# Patient Record
Sex: Female | Born: 1955 | Race: White | Hispanic: No | Smoking: Current every day smoker
Health system: Southern US, Community
[De-identification: ages and names within clinical notes are randomized; demographics above are authoritative.]

## PROBLEM LIST (undated history)

## (undated) DIAGNOSIS — J189 Pneumonia, unspecified organism: Secondary | ICD-10-CM

## (undated) DIAGNOSIS — J45909 Unspecified asthma, uncomplicated: Secondary | ICD-10-CM

## (undated) DIAGNOSIS — C801 Malignant (primary) neoplasm, unspecified: Secondary | ICD-10-CM

## (undated) DIAGNOSIS — I10 Essential (primary) hypertension: Secondary | ICD-10-CM

## (undated) DIAGNOSIS — R059 Cough, unspecified: Secondary | ICD-10-CM

## (undated) HISTORY — PX: TONSILLECTOMY: SUR1361

## (undated) HISTORY — PX: ABDOMINAL HYSTERECTOMY: SHX81

## (undated) HISTORY — PX: JOINT REPLACEMENT: SHX530

## (undated) HISTORY — PX: BREAST SURGERY: SHX581

## (undated) HISTORY — DX: Cough, unspecified: R05.9

## (undated) HISTORY — PX: ELBOW SURGERY: SHX618

## (undated) NOTE — *Deleted (*Deleted)
[facility]  [address] [place], [place] [address] Phone: [phone]  Fax: [phone]   Clinic Day:  08/23/2020  Referring physician: Lamont Dowdy, MD  Chief Complaint: Tamara Hall is a 74 y.o. 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  HPI: The patient was last seen in the medical oncology clinic on 07/04/2020. At that time, she felt "fairly good." She denied any abdominal pain.  Exam revealed weakness in the LLQ of the abdominal wall. Calcium was 8.8. CEA was 40.9 on 06/13/2020.  Chest CT with contrast on 08/22/2020 revealed mild reduction in size of the left infrahilar peribronchovascular nodule (1.2 x 2.2 cm, previously 1.5 x 2.3 cm). There was patchy ground-glass opacity in the left upper lobe and lingula as well as the left lower lobe, favoring radiation pneumonitis given the patient's radiation therapy, atypical infection was a less likely differential diagnostic consideration. There was coronary, aortic arch, and branch vessel atherosclerotic vascular disease, old granulomatous disease, and ablation defect in the left hepatic lobe.   Labs on 08/07/2020 revealed a sodium of 133, chloride 96, BUN 7, calcium 8.4. Folate was 68.0. CEA was 7.7. She received a vitamin B12 injection.  During the interim, she noted significant shortness of breath.  Her inhaler was not helping.     Past Medical History:  Diagnosis Date  . Cancer (HCC)    colon, with mets to liver and left lung  . Hypertension   . Pneumonia    20+ years ago    Past Surgical History:  Procedure Laterality Date  . ABDOMINAL HYSTERECTOMY    . BREAST SURGERY    . COLON SURGERY  10/2017   [facility]  . COLONOSCOPY WITH PROPOFOL N/A 01/14/2017   Procedure: COLONOSCOPY WITH PROPOFOL;  Surgeon: Midge Minium, MD;  Location: [facility] ENDOSCOPY;  Service:  Endoscopy;  Laterality: N/A;  . ELBOW SURGERY Right    Has had 4 surgeries on right elbow.  Marland Kitchen HAND SURGERY Right 2008  . JOINT REPLACEMENT    . LIVER BIOPSY  10/2017  . lung wedge resection  10/2017  . PORTA CATH INSERTION N/A 01/29/2017   Procedure: Shelda Pal Cath Insertion;  Surgeon: Annice Needy, MD;  Location: [facility] INVASIVE CV LAB;  Service: Cardiovascular;  Laterality: N/A;  . TONSILLECTOMY      Family History  Problem Relation Age of Onset  . Lung cancer Mother 14  . Esophageal cancer Father 70  . Brain cancer Maternal Grandmother     Social History:  reports that she quit smoking about 2 years ago. Her smoking use included cigarettes. She has a 80.00 pack-year smoking history. She has never used smokeless tobacco. She reports that she does not drink alcohol and does not use drugs. She was smoking 1/2 packs/day.She lives in [place]. She has 3 children (ages 44, 66, and 59). She smoked 2 packs a day. She started smoking at age 54. She initially stopped smoking on 10/24/2017. She works in a plant as an Designer, television/film set for [employer]. 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 Fayrene Fearing is her significant other. The patient is alone*** today.    Allergies: No Known Allergies  Current Medications: Current Outpatient Medications  Medication Sig Dispense Refill  . albuterol (VENTOLIN HFA) 108 (90 Base) MCG/ACT inhaler Inhale into the lungs.    Marland Kitchen amLODipine (NORVASC) 5 MG tablet Take  5 mg by mouth daily.    . baclofen (LIORESAL) 10 MG tablet Take 10 mg by mouth 3 (three) times daily.    . folic acid (FOLVITE) 1 MG tablet TAKE 1 TABLET BY MOUTH EVERY DAY 90 tablet 1  . gabapentin (NEURONTIN) 300 MG capsule Take 1,200 mg by mouth 3 (three) times daily.     . hydrochlorothiazide (HYDRODIURIL) 25 MG tablet Take 25 mg by mouth daily.    Marland Kitchen HYDROcodone-acetaminophen (NORCO/VICODIN) 5-325 MG tablet Take 1 tablet by mouth every 6 (six) hours as needed for moderate pain. 15 tablet 0  . LORazepam (ATIVAN)  0.5 MG tablet Take 1 tablet (0.5 mg total) by mouth every 6 (six) hours as needed (NAUSEA). 30 tablet 0  . nortriptyline (PAMELOR) 10 MG capsule Take 10 mg by mouth at bedtime.    Marland Kitchen omeprazole (PRILOSEC) 20 MG capsule TAKE 1 CAPSULE BY MOUTH EVERY DAY 30 capsule 1  . Ostomy Supplies (PREMIER DRAINABLE POUCH ) Pouch MISC     . potassium chloride (KLOR-CON M10) 10 MEQ tablet TAKE 1 TABLET (10 MEQ TOTAL) BY MOUTH 5 (FIVE) TIMES DAILY. 120 tablet 0   No current facility-administered medications for this visit.    Review of Systems  Constitutional: Negative for chills, diaphoresis, fever, malaise/fatigue and weight loss (6 pounds).       Feels "fairly good."  HENT: Negative.  Negative for congestion, ear discharge, ear pain, hearing loss, nosebleeds, sinus pain, sore throat and tinnitus.   Eyes: Negative.  Negative for blurred vision and double vision.  Respiratory: Positive for cough, hemoptysis (mild) and wheezing. Negative for sputum production and shortness of breath.   Cardiovascular: Negative.  Negative for chest pain, palpitations and leg swelling.  Gastrointestinal: Negative for abdominal pain, blood in stool, constipation (takes milk of magnesia daily), diarrhea, heartburn (takes medication every morning), melena, nausea and vomiting.       S/p parastomal hernia repair on 03/22/2020.  Genitourinary: Negative.  Negative for dysuria, frequency and urgency.  Musculoskeletal: Positive for back pain. Negative for falls, joint pain, myalgias and neck pain.  Skin: Negative.  Negative for itching and rash.  Neurological: Negative.  Negative for dizziness, sensory change, speech change, weakness and headaches.       "Wobbly" balance.  Endo/Heme/Allergies: Negative.  Negative for environmental allergies. Does not bruise/bleed easily.  Psychiatric/Behavioral: Negative.  Negative for depression and memory loss. The patient is not nervous/anxious and does not have insomnia.   All other systems  reviewed and are negative.  Performance status (ECOG): 1***  Vitals There were no vitals taken for this visit.   Physical Exam Vitals and nursing note reviewed.  Constitutional:      General: She is not in acute distress.    Appearance: Normal appearance. She is well-developed.     Interventions: Face mask in place.     Comments:    HENT:     Head: Normocephalic and atraumatic.     Comments: Long hair.    Mouth/Throat:     Mouth: Mucous membranes are moist. No oral lesions.     Pharynx: Oropharynx is clear.  Eyes:     General: No scleral icterus.    Conjunctiva/sclera: Conjunctivae normal.     Pupils: Pupils are equal, round, and reactive to light.     Comments: Blue eyes.  Neck:     Vascular: No JVD.  Cardiovascular:     Rate and Rhythm: Normal rate and regular rhythm.     Heart sounds:  Normal heart sounds. No murmur heard.  No friction rub. No gallop.   Pulmonary:     Effort: Pulmonary effort is normal. No respiratory distress.     Breath sounds: Normal breath sounds. No wheezing, rhonchi or rales.  Chest:     Chest wall: No tenderness.  Abdominal:     General: Bowel sounds are normal. There is no distension.     Palpations: Abdomen is soft. There is no mass.     Tenderness: There is no abdominal tenderness. There is no guarding or rebound.     Comments: Weakness in abdominal wall, LLQ  Musculoskeletal:        General: No swelling or tenderness. Normal range of motion.     Cervical back: Normal range of motion and neck supple.  Lymphadenopathy:     Head:     Right side of head: No preauricular, posterior auricular or occipital adenopathy.     Left side of head: No preauricular, posterior auricular or occipital adenopathy.     Cervical: No cervical adenopathy.     Upper Body:     Right upper body: No supraclavicular or axillary adenopathy.     Left upper body: No supraclavicular or axillary adenopathy.     Lower Body: No right inguinal adenopathy. No left inguinal  adenopathy.  Skin:    General: Skin is warm.     Coloration: Skin is not pale.     Findings: No bruising, erythema, lesion or rash.  Neurological:     Mental Status: She is alert and oriented to person, place, and time.  Psychiatric:        Behavior: Behavior normal.        Thought Content: Thought content normal.        Judgment: Judgment normal.    Imaging studies: 01/13/2017:Abdomen and pelvic CTrevealed irregular thickening of the rectum suspicious for carcinoma. There was a 1.6 cm hypoattenuating lesion in the right hepatic lobe worrisome for metastatic disease. 01/15/2017:Chest CT revealed a 9 mm noncalcified left lower lobe pulmonary nodule (new). 01/16/2017:Pelvic MRIrevealed rectal adenocarcinoma T3N1. There was extension beyond the muscularis propria (17 mm). There were mesorectal nodes >= 5 mm. There was adenopathy on the left side of the mesorectum, including a 9 mm lesion. The distance from the tumor to the anal sphincter was 8 mm. 01/27/2017:PET scanrevealed intense metabolic activity associated rectal mass consists with primary rectal carcinoma. There was a hypermetabolic metastatic lesion to the central LEFT hepatic lobe. There was metabolic activity associated with small LEFT lower lobe nodule which was most consistent with pulmonary metastasis. 01/31/2017:Liver MRIrevealed a 2.6 x 2.7 x 2.1 cm lesion in segment VIII of the liver near the junction with segment IVa c/w a metastatic lesion. There was a 1.1 x 0.8 cm suspected metastatic lesion peripherally in segment VI of the liver.  05/16/2017:Abdomen and pelvic CTrevealed persistent but decreased asymmetric wall thickening in the rectum. The medial segment left liver lesion was smaller (2.7 x 2.6 cm to 1.9 x 1.6 cm). A tiny lesion seen in segment VI of the liver on previous MRI was not evident on today's CT scan. The left lower lobe pulmonary nodule was smaller (9 mm to 6 mm). There was no new or  progressive findings in the abdomen and pelvis. 08/19/2017:Chest CTwith contrast on 08/19/2017 revealed left lower lobe 6 mm solid pulmonary nodule, decreased since 01/15/2017 chest CT. Stable 7 mm right lower lobe pulmonary nodule. There was no new or progressive metastatic disease in the  chest.  09/03/2018: Pelvic MRI at [facility] demonstrated a partial response of the primary tumor and extramural disease. Post treatment category ymrT T3b. There was an overall decrease in size in the previously seen perirectal node. Hypointense changes at the site of prior tumor focally contacedt buts does not definitely invade the LEFT levator ani. Tumor contacted, but does not definitely invade the internal anal sphincter. No evidence of residual or recurrent hepatic disease following microwave ablation. 02/17/2018:Abdomen and pelvic CTrevealed evolution of the ablation zone in segment 4A, now measuring 3.7 x 3.1 cm, decreased.There was no findings to suggest viable tumor.She is s/pabdominoperineal resection with left lower quadrant colostomy. There was no evidence of new/progressive metastatic disease.She is s/pleft lower lobe pulmonary wedge resection, incompletely visualized. 03/04/2018:Chest CTrevealed a stable chest CT (compared to11/13/2018). The index right lower lobe lung nodule measured7 mm (unchanged). There was no new or progressive nodularity identified. There was continued decrease in size of ablation defect within segment 4a of the liver. 08/13/2019:[facility] parastomal hernia with small bowel as well as multiple small bowel loops whichwere dilated.There was hepatic steatosis and post-treatment changes in the pelvis with persistent presacral soft tissue. There was a 3.5 cm infrarenal abdominal aortic aneurysm. 09/09/2019:C[facility] no intrathoracic metastasis and no new or enlarging pulmonary nodules. 09/09/2019:Abdomen and  pelvis CT at [facility] multiple subcentimeter hypodensities in the dome of the liver, which appeared new/more conspicuous from prior CT. There were not seen on the prior MRI. Further evaluation with MRI was recommended. CT evaluationwas limited due to background fatty liver and the heterogeneity/these lesions could have been secondary to fatty liver. There was unchanged soft tissue stranding and nodularity in the pelvis, with a few prominent more nodular areas. There were a few more prominent indeterminateareasand short-term follow up was recommended to ensure stability and exclude peritoneal disease. Further evaluation with MRI could have been helpful. 11/12/2019: Liver and pelvis MRIrevealedno findings to explain the patient's history of rising CEA. There was irregular presacral soft tissue with apparent scarring noted in the left paracentral pelviswas similar to prior studies and presumably treatment related. PET-CT could be used, as clinically warranted to exclude hypermetabolic disease within thatregion. There was an ablation defect identified in segment IV with minimal irregular enhancement along the anterosuperior margin, indeterminate. Attention on follow-up recommended.There was a tiny cyst identified in the dome of the right liver without other findings to suggest metastatic disease in the hepatic parenchyma. 11/29/2019:PET scanrevealed 1.6 x 1.5 cmnodularthickeningat the resection margin (SUV 11.3)in the LEFT lower lobe. There was no evidence of hepatic metastasis. There was no evidence of local recurrence in pelvis. There was insufficiency fracture in the LEFT sacral ala presumably related to radiation treatment. 02/23/2020:  Chest, abdomen and pelvis CT revealed a stable 2.6 x 1.7 cm partially calcified left infrahilar pulmonary nodule, hypermetabolic on PET scan and likely metastasis.  There were new no new or enlarging pulmonary nodules.  There were stable treated hepatic  metastatic disease with no evidence of progression or local recurrence.  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  There is no evidence of incarceration or obstruction.  There was mild wall thickening of multiple loops of small bowel in the mid abdomen, possibly related to prior radiation therapy.  There was stable chronic sacral insufficiency fractures bilaterally.  There was a stable 3.7 cm fusiform infrarenal abdominal aortic aneurysm.  03/22/2020:  Abdomen and pelvis CT at [facility] revealed a high-grade small  bowel obstruction. There were 2 separate loops of small bowel contained within the parastomal hernia. The first was more proximal small bowel which appeared dilated but nonobstructed. The second segment of included small bowel was more distal small bowel which appeared dilated entering into the herniaand collapsed exiting the hernia, consistent with point of obstruction.  There was a 3.7 cm infrarenal abdominal aortic aneurysm with extensive mural thrombus, similar in size compared with previous examination, although degree of mural thrombus had increased. Additional calcified atherosclerotic plaque was noted throughout the abdominal aorta and its branches.  08/22/2020:  Chest CT with contrast revealed mild reduction in size of the left infrahilar peribronchovascular nodule (1.2 x 2.2 cm, previously 1.5 x 2.3 cm. There was patchy ground-glass opacity in the left upper lobe and lingula as well as the left lower lobe, favoring radiation pneumonitis given the patient's radiation therapy, atypical infection was a less likely differential diagnostic consideration. There was coronary, aortic arch, and branch vessel atherosclerotic vascular disease, old granulomatous disease, and ablation defect in the left hepatic lobe.   No visits with results within 3 Day(s) from this visit.  Latest known visit with results is:  Infusion on 08/07/2020  Component Date Value  Ref Range Status  . CEA 08/07/2020 7.7* 0.0 - 4.7 ng/mL Final   Comment: (NOTE)                             Nonsmokers          <3.9                             Smokers             <5.6 [employer] Electrochemiluminescence Immunoassay (ECLIA) Values obtained with different assay methods or kits cannot be used interchangeably.  Results cannot be interpreted as absolute evidence of the presence or absence of malignant disease. Performed At: [facility] [address] [place], [place] [id number] Jolene Schimke MD YQ:[id number]   . Sodium 08/07/2020 133* 135 - 145 mmol/L Final  . Potassium 08/07/2020 3.7  3.5 - 5.1 mmol/L Final  . Chloride 08/07/2020 96* 98 - 111 mmol/L Final  . CO2 08/07/2020 28  22 - 32 mmol/L Final  . Glucose, Bld 08/07/2020 108* 70 - 99 mg/dL Final   Glucose reference range applies only to samples taken after fasting for at least 8 hours.  . BUN 08/07/2020 7* 8 - 23 mg/dL Final  . Creatinine, Ser 08/07/2020 0.73  0.44 - 1.00 mg/dL Final  . Calcium 52/84/1324 8.4* 8.9 - 10.3 mg/dL Final  . GFR, Estimated 08/07/2020 >60  >60 mL/min Final   Comment: (NOTE) Calculated using the CKD-EPI Creatinine Equation (2021)   . Anion gap 08/07/2020 9  5 - 15 Final   Performed at [facility], [address], [place], [place] [address]  . Folate 08/07/2020 68.0  >5.9 ng/mL Final   Performed at [facility], [address] [place] [place], [place] [address]    Assessment:  JARELLY RINCK is a 54 y.o. female with clinical stage T3N1M1rectal cancer. She presented with a 9 month history of rectal bleeding. Colonoscopyon 01/14/2017 revealed afrond-like/villous non-obstructing large mass in the rectum. The mass was non-circumferential. Biopsies revealed high grade dysplasia within an adenoma (? sampling error). CEAwas 31.6 on 01/15/2017.  PET scanon 01/27/2017 revealed intense metabolic activity associated rectal mass consists with primary  rectal  carcinoma. There was a hypermetabolic metastatic lesion to the central LEFT hepatic lobe. There was metabolic activity associated with small LEFT lower lobe nodule which was most consistent with pulmonary metastasis.  CT guided liver biopsyat [facility] on 02/07/2017 revealed malignant cells c/w metastatic colorectal adenocarcinoma. [facility] on 12/29/2019 [id number]) revealed MS-stable, 1 Muts/Mb tumor mutational burden; KRAS G12C, PTEN rearrangement intron 8, APC G1106*, T1458fs*18, and NRAS wildtype.  There were no reportable mutations in BRAF and NRAS.     She received 2 cycles of FOLFOX (02/18/2017 - 03/04/2017). She developed transient cold induced neuropathy. She developed wheezing and hypoxia with cycle #2 felt secondary to oxaliplatin. She received 4 cycles ofFOLFIRI(03/24/2017 - 04/22/2017) with Neulasta support. Diarrhea was controlled with Imodium.  She underwent CT guided microwave ablation of the liver lesionon 06/06/2017.   She began radiationon 06/18/2017. She receiveddaily Meliton Rattan radiation. Radiation and Xeloda completed on 08/15/2017.  She underwentAPR with descending colostomy and VRAM flapon 10/24/2017. She underwent a VATS wedge resectionto her LEFT lung. Pathology from the lung demonstrateda 1 cmmetastatic intestinal type adenocarcinoma c/w metastasis from patient's known colorectal carcinoma.Pathology from the rectal resection revealed a 2.1 cm invasive moderately differentiated adenocarcinoma arising in a tubular adenoma with transmural invasion focally into the perirectal soft tissue. There was no lymphovascular or perineural invasion. Margins were negative. Zero of 20 lymph nodes were positive. Final diagnosiswas [id number] rectal adenocarcinoma with metastasis to left lower lobe of the lung.  She has had post-operative pain and ostomy ischemia. She underwent surgical ostomy revisionon 11/28/2017. Pathologyshowed focal mucosal  erosion and fat necrosis c/w ischemia. There was extensive underlying fat necrosis. No dysplasia or carcinoma was identified. She was treated with oral Augmentin and wound packing. She has been treated with oxycodone 5 mg po q 4 hrs prn, gabapentin 900 mg TID. She was treated with Bactrim on 01/18/2018. She was referred to local pain management clinic on 01/27/2018.  PET scanon 11/29/2019 showed local recurrenceof hypermetabolic nodular lung malignancy at the resection margin(1.6 x 1.5 cm; SUV 11.3)in the LEFT lower lobe. There was no evidence of hepatic metastasis. There was no evidence of local recurrence in pelvis. There was insufficiency fracture in the LEFT sacral ala presumably related to radiation treatment.  Chest CTon 12/13/2019 revealed anenlarging partially calcified2.3 x 1.2 cmlesion in the medial aspect of the left lower lobe most compatible with a pulmonary metastasis given the hypermetabolism on recent PET-CT.There were several other tiny pulmonary nodules measuring 6 mm or less in size, some of whichwere new or increased in size compared to prior examinations.  She declined surgical resection.  Chest, abdomen and pelvis CT on 02/23/2020 revealed a stable 2.6 x 1.7 cm partially calcified left infrahilar pulmonary nodule, hypermetabolic on PET scan and likely metastasis.  There were new no new or enlarging pulmonary nodules.  There were stable treated hepatic metastatic disease with no evidence of progression or local recurrence.  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  There is no evidence of incarceration or obstruction.  There was mild wall thickening of multiple loops of small bowel in the mid abdomen, possibly related to prior radiation therapy.  There was stable chronic sacral insufficiency fractures bilaterally.  There was a stable 3.7 cm fusiform infrarenal abdominal aortic aneurysm.   She is s/p 1 cycle of  FOLFIRI (03/08/2020) with Fulphila support.  Cycle #1 was notable for nausea, vomiting, and diarrhea.  She declined further chemotherapy.  She received IMRT (6000  cGy) to the left lung lesion from 05/10/2020 - 05/22/2020.   Chest CT with contrast on 08/22/2020 revealed mild reduction in size of the left infrahilar peribronchovascular nodule (1.2 x 2.2 cm, previously 1.5 x 2.3 cm). There was patchy ground-glass opacity in the left upper lobe and lingula as well as the left lower lobe, favoring radiation pneumonitis.  CEAhas been followed: 31.6 on 01/15/2017, 38.1 on 02/18/2017, 25.1 on 03/18/2017, 11.8 on 04/08/2017, 9.2 on 04/22/2017, 9.5 on 05/20/2017, 9.6 on 06/19/2017, 3.7 on 02/18/2018,5.8 on 06/26/2018, 10.5 on 11/20/2018, 19.5 on 03/26/2019, 34.0 on 07/21/2019, 46.4 on 09/09/2019, 54.4 on 10/28/2019, 108.0 on 02/10/2020, 121.0 on 03/08/2020, 49.8 on 04/13/2020, 76.7 on 05/29/2020, 40.9 on 06/13/2020, and 7.7 on 08/07/2020.  Colonoscopy at [facility] 09/13/2019 did not appear malignant with no adenomatous (granulation tissue). There was diverticulosis in the descending colon, normal mucosa in the entire examined colon and the examined portion of the ileum. There was a 9 mmlipoma in the transverse colon anda4 mm polyp at the surgical stoma.   She has a B12 deficiency.[id number] (low)on 12/03/2019. She restarted B12 injections on 12/13/2019 (last 08/07/2020).  She has folate deficiency.  Folate was 5.8 (low) on 12/03/2019 and 68.0 on 08/07/2020.  She is on folic acid.    She has chronic hypokalemia.  She is on potassium 20 meq po q day.  [facility] for a smallbowel obstruction. She had severe abdominal pain with associated nausea and vomiting. Abdominal and pelvicCT revealedmultiple small bowel loops whichwere dilated.Obstruction resolved spontaneously.  She was admitted to [facility] from 03/22/2020 - 03/28/2020. She presented with an  incarcerated hernia and underwent parastomal hernia repair with no bowel resection on 03/22/2020.  There was a 3.7 cm infrarenal abdominal aortic aneurysm with extensive mural thrombus, similar in size compared with previous examination, although degree of mural thrombus had increased.  She does not plan to have the COVID-19 vaccine.   Symptomatically, ***  Plan: 1.   Labs today: CBC with diff, CMP.   2.Stage IV rectal carcinoma Shereceived6cycles of FOLFOX(02/18/2017 -04/22/2017).  She underwent CT guided microwave ablation of the liver lesionon 06/06/2017.  She receivedradiationand daily Xeloda (06/18/2017-08/15/2017). She underwentAPR with descending colostomy and VRAM flapon 10/24/2017.  She underwent a VATS wedge resection of her left lung. 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  She declined left lower lobe lobectomy.  She received 1 cycle of FOLFIRI (03/08/2020).                She declined further chemotherapy or surgery.  She completed SBRT to the left infrahilar nodule on 05/22/2020.  CEA was 40.9 on 06/13/2020 and 7.7 on 08/07/2020.   Chest CT on 08/22/2020 was personally reviewed.  Agree with radiology interpretation.   Left infrahilar nodule is slightly smaller.  Symptomatically, she is doing well.   Discuss plan for follow-up chest CT on 08/22/2020. 3. B12 and folate deficiency [id number] (low)on 12/03/2019 and folate 5.8 (low) on 12/03/2019. She receives B12 monthly (last 06/23/2020).             Continue B12 monthly.            4.Hypokalemia Potassium3.5 today. She is on potassium 60 mEq a day.  Patient to follow-up with Dr. Delane Ginger regarding likely HCTZ associated hypokalemia 5.   RTC in 1 month for labs (BMP, folate, CEA). 6.   RTC on 11/18 for MD  assessment, labs (CBC with diff, CMP), and review of chest CT.  I discussed the assessment and treatment plan with the patient.  The patient was provided an opportunity to ask questions and all were answered.  The patient agreed with the plan and demonstrated an understanding of the instructions.  The patient was advised to call back if the symptoms worsen or if the condition fails to improve as anticipated.   I provided *** minutes of face-to-face time during this this encounter and > 50% was spent counseling as documented under my assessment and plan.  Rosey Bath, MD, PhD    08/23/2020, 2:20 PM  I, Danella Penton Tufford, am acting as a Neurosurgeon for [employer]. Merlene Pulling, MD.   I, Jeremi Losito C. Merlene Pulling, MD, have reviewed the above documentation for accuracy and completeness, and I agree with the above.

---

## 2005-10-11 ENCOUNTER — Ambulatory Visit: Payer: Self-pay

## 2006-10-07 HISTORY — PX: HAND SURGERY: SHX662

## 2008-09-27 ENCOUNTER — Ambulatory Visit: Payer: Self-pay

## 2009-06-26 ENCOUNTER — Emergency Department: Payer: Self-pay | Admitting: Emergency Medicine

## 2010-05-22 ENCOUNTER — Ambulatory Visit: Payer: Self-pay | Admitting: Unknown Physician Specialty

## 2010-11-29 ENCOUNTER — Ambulatory Visit: Payer: Self-pay | Admitting: Unknown Physician Specialty

## 2011-06-27 ENCOUNTER — Ambulatory Visit: Payer: Self-pay | Admitting: Unknown Physician Specialty

## 2012-07-07 ENCOUNTER — Ambulatory Visit: Payer: Self-pay | Admitting: Pain Medicine

## 2012-07-20 ENCOUNTER — Ambulatory Visit: Payer: Self-pay | Admitting: Pain Medicine

## 2012-08-26 ENCOUNTER — Ambulatory Visit: Payer: Self-pay | Admitting: Pain Medicine

## 2014-05-24 ENCOUNTER — Ambulatory Visit: Payer: Self-pay | Admitting: Podiatry

## 2017-01-13 ENCOUNTER — Emergency Department: Payer: Managed Care, Other (non HMO)

## 2017-01-13 ENCOUNTER — Inpatient Hospital Stay
Admission: EM | Admit: 2017-01-13 | Discharge: 2017-01-16 | DRG: 375 | Disposition: A | Discharge status: Home / Self Care | Payer: Managed Care, Other (non HMO) | Attending: Internal Medicine | Admitting: Internal Medicine

## 2017-01-13 DIAGNOSIS — Z808 Family history of malignant neoplasm of other organs or systems: Secondary | ICD-10-CM | POA: Diagnosis not present

## 2017-01-13 DIAGNOSIS — I10 Essential (primary) hypertension: Secondary | ICD-10-CM | POA: Diagnosis not present

## 2017-01-13 DIAGNOSIS — Z90710 Acquired absence of both cervix and uterus: Secondary | ICD-10-CM

## 2017-01-13 DIAGNOSIS — R5383 Other fatigue: Secondary | ICD-10-CM | POA: Diagnosis not present

## 2017-01-13 DIAGNOSIS — Z801 Family history of malignant neoplasm of trachea, bronchus and lung: Secondary | ICD-10-CM

## 2017-01-13 DIAGNOSIS — C20 Malignant neoplasm of rectum: Secondary | ICD-10-CM

## 2017-01-13 DIAGNOSIS — R198 Other specified symptoms and signs involving the digestive system and abdomen: Secondary | ICD-10-CM

## 2017-01-13 DIAGNOSIS — K5900 Constipation, unspecified: Secondary | ICD-10-CM | POA: Diagnosis present

## 2017-01-13 DIAGNOSIS — I714 Abdominal aortic aneurysm, without rupture: Secondary | ICD-10-CM | POA: Diagnosis not present

## 2017-01-13 DIAGNOSIS — R109 Unspecified abdominal pain: Secondary | ICD-10-CM | POA: Diagnosis not present

## 2017-01-13 DIAGNOSIS — K769 Liver disease, unspecified: Secondary | ICD-10-CM | POA: Diagnosis present

## 2017-01-13 DIAGNOSIS — K5730 Diverticulosis of large intestine without perforation or abscess without bleeding: Secondary | ICD-10-CM | POA: Diagnosis present

## 2017-01-13 DIAGNOSIS — K625 Hemorrhage of anus and rectum: Secondary | ICD-10-CM | POA: Diagnosis present

## 2017-01-13 DIAGNOSIS — F17210 Nicotine dependence, cigarettes, uncomplicated: Secondary | ICD-10-CM | POA: Diagnosis present

## 2017-01-13 DIAGNOSIS — R1900 Intra-abdominal and pelvic swelling, mass and lump, unspecified site: Secondary | ICD-10-CM | POA: Diagnosis not present

## 2017-01-13 DIAGNOSIS — Z9889 Other specified postprocedural states: Secondary | ICD-10-CM | POA: Diagnosis not present

## 2017-01-13 DIAGNOSIS — Z9660 Presence of unspecified orthopedic joint implant: Secondary | ICD-10-CM | POA: Diagnosis present

## 2017-01-13 DIAGNOSIS — R933 Abnormal findings on diagnostic imaging of other parts of digestive tract: Secondary | ICD-10-CM | POA: Diagnosis not present

## 2017-01-13 DIAGNOSIS — D490 Neoplasm of unspecified behavior of digestive system: Secondary | ICD-10-CM | POA: Diagnosis not present

## 2017-01-13 DIAGNOSIS — R140 Abdominal distension (gaseous): Secondary | ICD-10-CM | POA: Diagnosis not present

## 2017-01-13 DIAGNOSIS — D375 Neoplasm of uncertain behavior of rectum: Secondary | ICD-10-CM

## 2017-01-13 DIAGNOSIS — K921 Melena: Secondary | ICD-10-CM | POA: Diagnosis not present

## 2017-01-13 HISTORY — DX: Essential (primary) hypertension: I10

## 2017-01-13 LAB — URINALYSIS, COMPLETE (UACMP) WITH MICROSCOPIC
BACTERIA UA: NONE SEEN
Bilirubin Urine: NEGATIVE
Glucose, UA: NEGATIVE mg/dL
Hgb urine dipstick: NEGATIVE
Ketones, ur: NEGATIVE mg/dL
Nitrite: NEGATIVE
PH: 6 (ref 5.0–8.0)
Protein, ur: NEGATIVE mg/dL
Specific Gravity, Urine: 1.009 (ref 1.005–1.030)

## 2017-01-13 LAB — CBC
HEMATOCRIT: 38.5 % (ref 35.0–47.0)
Hemoglobin: 12.9 g/dL (ref 12.0–16.0)
MCH: 30.6 pg (ref 26.0–34.0)
MCHC: 33.5 g/dL (ref 32.0–36.0)
MCV: 91.4 fL (ref 80.0–100.0)
PLATELETS: 326 10*3/uL (ref 150–440)
RBC: 4.21 MIL/uL (ref 3.80–5.20)
RDW: 13.4 % (ref 11.5–14.5)
WBC: 11.1 10*3/uL — ABNORMAL HIGH (ref 3.6–11.0)

## 2017-01-13 LAB — HEMOGLOBIN: HEMOGLOBIN: 13.2 g/dL (ref 12.0–16.0)

## 2017-01-13 LAB — COMPREHENSIVE METABOLIC PANEL
ALBUMIN: 4.2 g/dL (ref 3.5–5.0)
ALT: 13 U/L — AB (ref 14–54)
AST: 18 U/L (ref 15–41)
Alkaline Phosphatase: 73 U/L (ref 38–126)
Anion gap: 4 — ABNORMAL LOW (ref 5–15)
BUN: 12 mg/dL (ref 6–20)
CHLORIDE: 104 mmol/L (ref 101–111)
CO2: 30 mmol/L (ref 22–32)
CREATININE: 0.72 mg/dL (ref 0.44–1.00)
Calcium: 9.1 mg/dL (ref 8.9–10.3)
GFR calc Af Amer: >60 mL/min (ref >60)
GLUCOSE: 96 mg/dL (ref 65–99)
POTASSIUM: 3.9 mmol/L (ref 3.5–5.1)
Sodium: 138 mmol/L (ref 135–145)
Total Bilirubin: 0.2 mg/dL — ABNORMAL LOW (ref 0.3–1.2)
Total Protein: 7.4 g/dL (ref 6.5–8.1)

## 2017-01-13 MED ORDER — IOPAMIDOL (ISOVUE-300) INJECTION 61%
100.0000 mL | Freq: Once | INTRAVENOUS | Status: AC | PRN
  Administered 2017-01-13: 100 mL via INTRAVENOUS
  Filled 2017-01-13: qty 100

## 2017-01-13 MED ORDER — IOPAMIDOL (ISOVUE-300) INJECTION 61%
30.0000 mL | Freq: Once | INTRAVENOUS | Status: AC | PRN
  Administered 2017-01-13: 30 mL via ORAL
  Filled 2017-01-13: qty 30

## 2017-01-13 MED ORDER — ACETAMINOPHEN 650 MG RE SUPP: 650.0000 mg | Freq: Four times a day (QID) | RECTAL | Status: DC | PRN

## 2017-01-13 MED ORDER — PEG 3350-KCL-NA BICARB-NACL 420 G PO SOLR
4000.0000 mL | Freq: Once | ORAL | Status: AC
  Administered 2017-01-13: 21:00:00 4000 mL via ORAL
  Filled 2017-01-13: qty 4000

## 2017-01-13 MED ORDER — HYDRALAZINE HCL 20 MG/ML IJ SOLN: 10.0000 mg | Freq: Four times a day (QID) | INTRAMUSCULAR | Status: DC | PRN

## 2017-01-13 MED ORDER — ACETAMINOPHEN 325 MG PO TABS
650.0000 mg | ORAL_TABLET | Freq: Four times a day (QID) | ORAL | Status: DC | PRN
  Administered 2017-01-13: 21:00:00 650 mg via ORAL
  Filled 2017-01-13: qty 2

## 2017-01-13 MED ORDER — ONDANSETRON HCL 4 MG/2ML IJ SOLN
4.0000 mg | Freq: Four times a day (QID) | INTRAMUSCULAR | Status: DC | PRN
  Administered 2017-01-15 – 2017-01-16 (×2): 4 mg via INTRAVENOUS
  Filled 2017-01-13 (×2): qty 2

## 2017-01-13 MED ORDER — ONDANSETRON HCL 4 MG PO TABS: 4.0000 mg | ORAL_TABLET | Freq: Four times a day (QID) | ORAL | Status: DC | PRN

## 2017-01-13 MED ORDER — PNEUMOCOCCAL VAC POLYVALENT 25 MCG/0.5ML IJ INJ: 0.5000 mL | INJECTION | INTRAMUSCULAR | Status: DC

## 2017-01-13 NOTE — ED Triage Notes (Signed)
Pt reports that she has rectal bleeding with abd pain for the past 9 months but over the last 3 days she has been having rectal bleeding to the point that it is running down her leg and she passed a huge blood clot from her rectum 2 times yesterday and today

## 2017-01-13 NOTE — Consult Note (Signed)
Patient ID: Tamara Hall, female   DOB: 26-Sep-1956, 61 y.o.   MRN: [id number]  CC: ABDOMINAL PAIN  HPI Tamara Hall is a 61 y.o. female who is currently admitted to the medicine service with abdominal pain and blood per rectum. Surgical consultation requested by Dr. Verdell Carmine. Patient reports that she's been having intermittent rectal bleeding, rectal pressure, abdominal pain and small stools over the last 8-9 months. She reports a significant increase in amount of blood per rectum over the last 3 days. She described significant bloody output. Patient also reports that she cannot sit directly on a chair due to the sensation of pressure. She has the constant feeling of needing to go to the bathroom but usually nothing comes out. Patient reports that prior to the weekend she thought it was a problem that would go away on its own. She reports that she's had a feeling of weakness, dizziness as well as a relative anorexia over the last many months. She has been scared to eat due to abdominal distention and bloating caused by eating. Secondary to this she has had significant weight loss. She denies any fevers, chills, chest pain, shortness breath, nausea, vomiting. She does not take any chronic medications and denies any other recent changes in her health.  HPI  Past Medical History:  Diagnosis Date  . Hypertension     Past Surgical History:  Procedure Laterality Date  . ABDOMINAL HYSTERECTOMY    . BREAST SURGERY    . JOINT REPLACEMENT    . TONSILLECTOMY      Family History  Problem Relation Age of Onset  . Lung cancer Mother   . Lung cancer Father   . Brain cancer Maternal Grandmother     Social History Social History  Substance Use Topics  . Smoking status: Current Every Day Smoker    Packs/day: 2.00    Years: 40.00    Types: Cigarettes  . Smokeless tobacco: Never Used  . Alcohol use No    No Known Allergies  Current Facility-Administered Medications  Medication Dose Route  Frequency Provider Last Rate Last Dose  . acetaminophen (TYLENOL) tablet 650 mg  650 mg Oral Q6H PRN Henreitta Leber, MD       Or  . acetaminophen (TYLENOL) suppository 650 mg  650 mg Rectal Q6H PRN Henreitta Leber, MD      . hydrALAZINE (APRESOLINE) injection 10 mg  10 mg Intravenous Q6H PRN Henreitta Leber, MD      . ondansetron (ZOFRAN) tablet 4 mg  4 mg Oral Q6H PRN Henreitta Leber, MD       Or  . ondansetron (ZOFRAN) injection 4 mg  4 mg Intravenous Q6H PRN Henreitta Leber, MD      . Derrill Memo ON 01/14/2017] pneumococcal 23 valent vaccine (PNU-IMMUNE) injection 0.5 mL  0.5 mL Intramuscular Tomorrow-1000 Henreitta Leber, MD      . polyethylene glycol-electrolytes (NuLYTELY/GoLYTELY) solution 4,000 mL  4,000 mL Oral Once Henreitta Leber, MD         Review of Systems A Multi-point review of systems was asked and was negative except for the findings documented in the history of present illness  Physical Exam Blood pressure 139/62, pulse 76, temperature 97.7 F (36.5 C), temperature source Oral, resp. rate 20, height '5\' 7"'$  (1.702 m), weight 72.6 kg (160 lb), SpO2 100 %. CONSTITUTIONAL: No acute distress. EYES: Pupils are equal, round, and reactive to light, Sclera are non-icteric. EARS, NOSE, MOUTH AND THROAT:  The oropharynx is clear. The oral mucosa is pink and moist. Hearing is intact to voice. LYMPH NODES:  Lymph nodes in the neck are normal. RESPIRATORY:  Lungs are clear. There is normal respiratory effort, with equal breath sounds bilaterally, and without pathologic use of accessory muscles. CARDIOVASCULAR: Heart is regular without murmurs, gallops, or rubs. GI: The abdomen is soft, nontender, and nondistended. There are no palpable masses. There is no hepatosplenomegaly. There are normal bowel sounds in all quadrants. GU: Rectal deferred.   MUSCULOSKELETAL: Normal muscle strength and tone. No cyanosis or edema.   SKIN: Turgor is good and there are no pathologic skin lesions or  ulcers. NEUROLOGIC: Motor and sensation is grossly normal. Cranial nerves are grossly intact. PSYCH:  Oriented to person, place and time. Affect is normal.  Data Reviewed Images and labs reviewed. Labs show a mild leukocytosis of 11.1, normal H&H of 12.9 and 38.5, normal platelets of 326. Electrolytes are all within normal limits. CT scan of the abdomen does appear to show thickening of the rectal mucosa concerning for rectal versus colon cancer. Also appears to show an isolated liver metastases to the right lobe of the liver. I have personally reviewed the patient's imaging, laboratory findings and medical records.    Assessment    Rectal mass    Plan    61 year old female that his medicine service for abdominal pain and rectal bleeding. Images concerning for rectal mass concerning for cancer. Patient voiced understanding of the dire nature of her diagnosis. She also voiced understanding that the next step in diagnosing this would be the endoscopy is currently planned for tomorrow. Discussed that any operative planning prior to that would be difficult. She understands that she'll be meeting and oncologist once a tumor diagnosis is made. Discussed the possibility of needing an ostomy for diversion should she develop obstruction of her bowel from the tumor. Patient voiced understanding. The Gen. surgery services will continue to follow and await results of endoscopy.      Time spent with the patient was 80 minutes, with more than 50% of the time spent in face-to-face education, counseling and care coordination.     Clayburn Pert, MD FACS General Surgeon 01/13/2017, 8:47 PM

## 2017-01-13 NOTE — ED Provider Notes (Signed)
[facility] Emergency Department Provider Note  ____________________________________________  Time seen: Approximately 3:10 PM  I have reviewed the triage vital signs and the nursing notes.   HISTORY  Chief Complaint Abdominal Pain and Rectal Bleeding   HPI Tamara Hall is a 61 y.o. female with no significant past medical history who presents for evaluation of abdominal distention and rectal bleeding. Patient reports that for the last 9 months she's been struggling with constipation and changes in the caliber of her stool. She reports that she has been having small bowel movements are pellets in size and hasn't had a normal onein 9 months. She has had on and off blood streaking in her stool however for the last 4 days patient has had significant amount of blood per rectum. She reports 2 days where she passed large clots and also reports that the bleeding is present even when she is not having a bowel movement. She was sitting on a chair had a large amount of blood coming from her rectum yesterday. She has been feeling very fatigued and dizzy but no chest pain or shortness of breath. She is also complaining of abdominal distention which is been getting progressively worse. She has had a hysterectomy and 2 ovarian cyst surgeries in the past. She denies nausea or vomiting. She has been able to eat without difficulties. No history of SBO. No dysuria hematuria. No family history of colon cancer. She is a smoker.  History reviewed. No pertinent past medical history.  There are no active problems to display for this patient.   Past Surgical History:  Procedure Laterality Date  . ABDOMINAL HYSTERECTOMY    . BREAST SURGERY    . JOINT REPLACEMENT    . TONSILLECTOMY      Prior to Admission medications   Not on File    Allergies Patient has no known allergies.  No family history on file.  Social History Social History  Substance Use Topics  . Smoking status:  Current Every Day Smoker    Packs/day: 2.00    Types: Cigarettes  . Smokeless tobacco: Never Used  . Alcohol use No    Review of Systems  Constitutional: Negative for fever. Eyes: Negative for visual changes. ENT: Negative for sore throat. Neck: No neck pain  Cardiovascular: Negative for chest pain. Respiratory: Negative for shortness of breath. Gastrointestinal: Negative for abdominal pain, vomiting or diarrhea. + abdominal distention and rectal bleeding Genitourinary: Negative for dysuria. Musculoskeletal: Negative for back pain. Skin: Negative for rash. Neurological: Negative for headaches, weakness or numbness. Psych: No SI or HI  ____________________________________________   PHYSICAL EXAM:  VITAL SIGNS: ED Triage Vitals [01/13/17 1245]  Enc Vitals Group     BP (!) 162/67     Pulse Rate 77     Resp 16     Temp 98.9 F (37.2 C)     Temp Source Oral     SpO2 98 %     Weight 160 lb (72.6 kg)     Height '5\' 7"'$  (1.702 m)     Head Circumference      Peak Flow      Pain Score 6     Pain Loc      Pain Edu?      Excl. in [place]?     Constitutional: Alert and oriented. Well appearing and in no apparent distress. HEENT:      Head: Normocephalic and atraumatic.         Eyes: Conjunctivae  are normal. Sclera is non-icteric. EOMI. PERRL      Mouth/Throat: Mucous membranes are moist.       Neck: Supple with no signs of meningismus. Cardiovascular: Regular rate and rhythm. No murmurs, gallops, or rubs. 2+ symmetrical distal pulses are present in all extremities. No JVD. Respiratory: Normal respiratory effort. Lungs are clear to auscultation bilaterally. No wheezes, crackles, or rhonchi.  Gastrointestinal: Abdomen is distended with normal bowel sounds, diffusely tender with severe tenderness in the right lower quadrant. Rectal exam showing no hemorrhoids or masses palpable. Dark brown stool grossly guaiac positive  Genitourinary: No CVA tenderness. Musculoskeletal: Nontender  with normal range of motion in all extremities. No edema, cyanosis, or erythema of extremities. Neurologic: Normal speech and language. Face is symmetric. Moving all extremities. No gross focal neurologic deficits are appreciated. Skin: Skin is warm, dry and intact. No rash noted. Psychiatric: Mood and affect are normal. Speech and behavior are normal.  ____________________________________________   LABS (all labs ordered are listed, but only abnormal results are displayed)  Labs Reviewed  COMPREHENSIVE METABOLIC PANEL - Abnormal; Notable for the following:       Result Value   ALT 13 (*)    Total Bilirubin 0.2 (*)    Anion gap 4 (*)    All other components within normal limits  CBC - Abnormal; Notable for the following:    WBC 11.1 (*)    All other components within normal limits  URINALYSIS, COMPLETE (UACMP) WITH MICROSCOPIC - Abnormal; Notable for the following:    Color, Urine STRAW (*)    APPearance HAZY (*)    Leukocytes, UA TRACE (*)    Squamous Epithelial / LPF 0-5 (*)    All other components within normal limits  POC OCCULT BLOOD, ED  TYPE AND SCREEN   ____________________________________________  EKG  none ____________________________________________  RADIOLOGY  CT a/p: Irregular wall thickening of the rectum is suspicious for carcinoma in this patient with rectal bleeding. 1.6 cm lesion in the right hepatic lobe is worrisome for metastatic disease. The appearance this lesion is not typical of a cyst or hemangioma.  3.2 cm infrarenal abdominal aortic aneurysm. Recommend followup by ultrasound in 3 years.  ____________________________________________   PROCEDURES  Procedure(s) performed: None Procedures Critical Care performed:  None ____________________________________________   INITIAL IMPRESSION / ASSESSMENT AND PLAN / ED COURSE  61 y.o. female with no significant past medical history who presents for evaluation of abdominal distention x 9 months,  changes in stool caliber and rectal bleeding with large clots over the course of the last 4 days. Patient is hemodynamically stable, abdomen is distended with significant tenderness mostly on the right lower quadrant and normal bowel sounds. Rectal exam showing brown stool which is guaiac positive. Her hemoglobin is stable at 12.9. CBC and CMP with no acute findings. UA with no evidence of blood. This presentation is concerning for possible colon mass. We'll do a CT abdomen and pelvis since patient has significant tenderness on the right lower quadrant and admit for trending of hgb and GI eval.  Clinical Course as of Jan 14 1644  Mon Jan 13, 2017  1643 CT concerning for rectal mass with possible metastatic disease to the liver. I have discussed these findings with the patient. She is to be admitted for trending of her hemoglobin and evaluation by oncology.  [CV]    Clinical Course User Index [CV] Rudene Re, MD    Pertinent labs & imaging results that were available during my care  of the patient were reviewed by me and considered in my medical decision making (see chart for details).    ____________________________________________   FINAL CLINICAL IMPRESSION(S) / ED DIAGNOSES  Final diagnoses:  Rectal cancer ([place])  Rectal bleeding      NEW MEDICATIONS STARTED DURING THIS VISIT:  New Prescriptions   No medications on file     Note:  This document was prepared using Dragon voice recognition software and may include unintentional dictation errors.    Rudene Re, MD 01/13/17 [phone]

## 2017-01-13 NOTE — ED Notes (Signed)
Patient transported to CT 

## 2017-01-13 NOTE — H&P (Signed)
[place] [facility] [place] NAME: Tamara Hall    MR#:  [id number]  DATE OF BIRTH:  1956/06/12  DATE OF ADMISSION:  01/13/2017  PRIMARY CARE PHYSICIAN: No PCP Per Patient   REQUESTING/REFERRING PHYSICIAN: Dr. Rudene Re  CHIEF COMPLAINT:   Chief Complaint  Patient presents with  . Abdominal Pain  . Rectal Bleeding    HISTORY OF PRESENT ILLNESS:  Tamara Hall  is a 61 y.o. female with No known past medical history who presents to the hospital due to rectal bleeding with clots. Patient says she's been having rectal bleeding on and off now for the past 9 months with some occasional mucoid stool. Over the past 3 days she's had significant rectal bleeding with clots. She developed some abdominal pain today along with her bleeding and therefore was concerned and came to the ER for further evaluation. Patient's hemoglobin in the ER is stable but her CT scan of the abdomen and pelvis suspicious for a possible rectal mass with a hepatic lesion consistent with metastatic disease and therefore hospitalist services were contacted further treatment and evaluation. Patient denies any chest pains, shortness of breath, nausea, vomiting, fever, chills, weight loss, night sweats or any other associated symptoms presently.  PAST MEDICAL HISTORY:   Past Medical History:  Diagnosis Date  . Hypertension     PAST SURGICAL HISTORY:   Past Surgical History:  Procedure Laterality Date  . ABDOMINAL HYSTERECTOMY    . BREAST SURGERY    . JOINT REPLACEMENT    . TONSILLECTOMY      SOCIAL HISTORY:   Social History  Substance Use Topics  . Smoking status: Current Every Day Smoker    Packs/day: 2.00    Years: 40.00    Types: Cigarettes  . Smokeless tobacco: Never Used  . Alcohol use No    FAMILY HISTORY:   Family History  Problem Relation Age of Onset  . Lung cancer Mother   . Lung cancer Father   . Brain cancer Maternal Grandmother     DRUG ALLERGIES:   No Known Allergies  REVIEW OF SYSTEMS:   Review of Systems  Constitutional: Negative for fever and weight loss.  HENT: Negative for congestion, nosebleeds and tinnitus.   Eyes: Negative for blurred vision, double vision and redness.  Respiratory: Negative for cough, hemoptysis and shortness of breath.   Cardiovascular: Negative for chest pain, orthopnea, leg swelling and PND.  Gastrointestinal: Positive for blood in stool. Negative for abdominal pain, diarrhea, melena, nausea and vomiting.  Genitourinary: Negative for dysuria, hematuria and urgency.  Musculoskeletal: Negative for falls and joint pain.  Neurological: Negative for dizziness, tingling, sensory change, focal weakness, seizures, weakness and headaches.  Endo/Heme/Allergies: Negative for polydipsia. Does not bruise/bleed easily.  Psychiatric/Behavioral: Negative for depression and memory loss. The patient is not nervous/anxious.     MEDICATIONS AT HOME:   Prior to Admission medications   Not on File      VITAL SIGNS:  Blood pressure (!) 166/81, pulse 74, temperature 98.9 F (37.2 C), temperature source Oral, resp. rate 18, height '5\' 7"'$  (1.702 m), weight 72.6 kg (160 lb), SpO2 100 %.  PHYSICAL EXAMINATION:  Physical Exam  GENERAL:  61 y.o.-year-old patient lying in bed in no acute distress.  EYES: Pupils equal, round, reactive to light and accommodation. No scleral icterus. Extraocular muscles intact.  HEENT: Head atraumatic, normocephalic. Oropharynx and nasopharynx clear. No oropharyngeal erythema, moist oral mucosa  NECK:  Supple, no jugular venous  distention. No thyroid enlargement, no tenderness.  LUNGS: Normal breath sounds bilaterally, no wheezing, rales, rhonchi. No use of accessory muscles of respiration.  CARDIOVASCULAR: S1, S2 RRR. No murmurs, rubs, gallops, clicks.  ABDOMEN: Soft, nontender, nondistended. Bowel sounds present. No organomegaly or mass.  EXTREMITIES: No pedal edema, cyanosis, or clubbing.  + 2 pedal & radial pulses b/l.   NEUROLOGIC: Cranial nerves II through XII are intact. No focal Motor or sensory deficits appreciated b/l PSYCHIATRIC: The patient is alert and oriented x 3.  SKIN: No obvious rash, lesion, or ulcer.   LABORATORY PANEL:   CBC  Recent Labs Lab 01/13/17 1409  WBC 11.1*  HGB 12.9  HCT 38.5  PLT 326   ------------------------------------------------------------------------------------------------------------------  Chemistries   Recent Labs Lab 01/13/17 1409  NA 138  K 3.9  CL 104  CO2 30  GLUCOSE 96  BUN 12  CREATININE 0.72  CALCIUM 9.1  AST 18  ALT 13*  ALKPHOS 73  BILITOT 0.2*   ------------------------------------------------------------------------------------------------------------------  Cardiac Enzymes No results for input(s): TROPONINI in the last 168 hours. ------------------------------------------------------------------------------------------------------------------  RADIOLOGY:  Ct Abdomen Pelvis W Contrast  Result Date: 01/13/2017 CLINICAL DATA:  Rectal bleeding and abdominal pain for 9 months. Bleeding is markedly worsened over the past 3 days. EXAM: CT ABDOMEN AND PELVIS WITH CONTRAST TECHNIQUE: Multidetector CT imaging of the abdomen and pelvis was performed using the standard protocol following bolus administration of intravenous contrast. CONTRAST:  100 ml ISOVUE-300 IOPAMIDOL (ISOVUE-300) INJECTION 61% COMPARISON:  None. FINDINGS: Lower chest: No pleural or pericardial effusion. Punctate calcified granulomata are seen in the lower lobes bilaterally. Hepatobiliary: A 1.3 by 1.6 cm hypoattenuating lesion is seen on image 13 in the right hepatic lobe. A second punctate hypoattenuating lesion is identified on image 19. Multiple small calcifications are seen throughout the liver. The gallbladder and biliary tree appear normal. Pancreas: Unremarkable. No pancreatic ductal dilatation or surrounding inflammatory changes. Spleen:  No acute or focal abnormality. Normal in size. Multiple calcifications in the spleen are consistent with old granulomatous disease. Adrenals/Urinary Tract: Adrenal glands are unremarkable. Kidneys are normal, without renal calculi, focal lesion, or hydronephrosis. Bladder is unremarkable. Stomach/Bowel: Irregular wall thickening in the rectum is suspicious for rectal mass. The colon otherwise appears normal. No evidence of appendicitis is seen. The stomach and small bowel appear normal. Vascular/Lymphatic: There is extensive aortoiliac atherosclerosis. Infrarenal aortic aneurysm measures 3.2 x 3.0 cm. Small pelvic sidewall lymph nodes are seen bilaterally. No pathologic lymphadenopathy by CT size criteria is identified. Reproductive:  Status post hysterectomy.  No adnexal masses. Other: No fluid collection. Musculoskeletal: No lytic or sclerotic lesion. IMPRESSION: Irregular wall thickening of the rectum is suspicious for carcinoma in this patient with rectal bleeding. 1.6 cm lesion in the right hepatic lobe is worrisome for metastatic disease. The appearance this lesion is not typical of a cyst or hemangioma. 3.2 cm infrarenal abdominal aortic aneurysm. Recommend followup by ultrasound in 3 years. This recommendation follows ACR consensus guidelines: White Paper of the ACR Incidental Findings Committee II on Vascular Findings. Joellyn Rued Radiol 2013; [phone] Old granulomatous disease. Electronically Signed   By: Inge Rise M.D.   On: 01/13/2017 16:06     IMPRESSION AND PLAN:   61 year old female with no significant past medical history presents to the hospital but rectal bleeding.  1. Rectal bleeding-suspicious to be secondary to malignancy and rectal cancer given her CT scan findings. -Hemoglobin currently stable and will follow serial hemoglobin. Transfuse if needed.  -we'll get  a gastroenterology consult and plan for colonoscopy tomorrow.   2. Rectal thickening with a hepatic  lesion-suspicious for underlying rectal cancer which is metastatic. -Patient to have colonoscopy tomorrow to get tissue diagnosis, we'll consult surgery and discussed the case with Dr. Burt Knack. -Patient will possibly need a oncologic consultation as an outpatient.  All the records are reviewed and case discussed with ED provider. Management plans discussed with the patient, family and they are in agreement.  CODE STATUS: Full  TOTAL TIME TAKING CARE OF THIS PATIENT: 45 minutes.    Henreitta Leber M.D on 01/13/2017 at 5:48 PM  Between 7am to 6pm - Pager - [phone]  After 6pm go to [email or web address] - password [email or web address] [facility]  [facility]  Office  [phone]  CC: Primary care physician; No PCP Per Patient

## 2017-01-14 ENCOUNTER — Encounter: Payer: Self-pay | Admitting: Anesthesiology

## 2017-01-14 ENCOUNTER — Encounter
Admission: EM | Disposition: A | Discharge status: Home / Self Care | Payer: Self-pay | Source: Home / Self Care | Attending: Internal Medicine

## 2017-01-14 ENCOUNTER — Inpatient Hospital Stay: Payer: Managed Care, Other (non HMO) | Admitting: Anesthesiology

## 2017-01-14 DIAGNOSIS — D490 Neoplasm of unspecified behavior of digestive system: Secondary | ICD-10-CM

## 2017-01-14 DIAGNOSIS — R198 Other specified symptoms and signs involving the digestive system and abdomen: Secondary | ICD-10-CM

## 2017-01-14 DIAGNOSIS — Z801 Family history of malignant neoplasm of trachea, bronchus and lung: Secondary | ICD-10-CM

## 2017-01-14 DIAGNOSIS — R109 Unspecified abdominal pain: Secondary | ICD-10-CM

## 2017-01-14 DIAGNOSIS — I10 Essential (primary) hypertension: Secondary | ICD-10-CM

## 2017-01-14 DIAGNOSIS — I714 Abdominal aortic aneurysm, without rupture: Secondary | ICD-10-CM

## 2017-01-14 DIAGNOSIS — R933 Abnormal findings on diagnostic imaging of other parts of digestive tract: Secondary | ICD-10-CM

## 2017-01-14 DIAGNOSIS — K921 Melena: Secondary | ICD-10-CM

## 2017-01-14 DIAGNOSIS — F17210 Nicotine dependence, cigarettes, uncomplicated: Secondary | ICD-10-CM

## 2017-01-14 DIAGNOSIS — R5383 Other fatigue: Secondary | ICD-10-CM

## 2017-01-14 DIAGNOSIS — R140 Abdominal distension (gaseous): Secondary | ICD-10-CM

## 2017-01-14 DIAGNOSIS — R1900 Intra-abdominal and pelvic swelling, mass and lump, unspecified site: Secondary | ICD-10-CM

## 2017-01-14 DIAGNOSIS — K5900 Constipation, unspecified: Secondary | ICD-10-CM

## 2017-01-14 DIAGNOSIS — K769 Liver disease, unspecified: Secondary | ICD-10-CM

## 2017-01-14 HISTORY — PX: COLONOSCOPY WITH PROPOFOL: SHX5780

## 2017-01-14 LAB — CBC
HEMATOCRIT: 35.7 % (ref 35.0–47.0)
HEMOGLOBIN: 12.2 g/dL (ref 12.0–16.0)
MCH: 30.6 pg (ref 26.0–34.0)
MCHC: 34.2 g/dL (ref 32.0–36.0)
MCV: 89.3 fL (ref 80.0–100.0)
Platelets: 309 10*3/uL (ref 150–440)
RBC: 4 MIL/uL (ref 3.80–5.20)
RDW: 13.3 % (ref 11.5–14.5)
WBC: 8.4 10*3/uL (ref 3.6–11.0)

## 2017-01-14 LAB — BASIC METABOLIC PANEL
ANION GAP: 8 (ref 5–15)
BUN: 10 mg/dL (ref 6–20)
CO2: 25 mmol/L (ref 22–32)
Calcium: 8.6 mg/dL — ABNORMAL LOW (ref 8.9–10.3)
Chloride: 104 mmol/L (ref 101–111)
Creatinine, Ser: 0.72 mg/dL (ref 0.44–1.00)
GFR calc Af Amer: >60 mL/min (ref >60)
GLUCOSE: 110 mg/dL — AB (ref 65–99)
POTASSIUM: 3.5 mmol/L (ref 3.5–5.1)
Sodium: 137 mmol/L (ref 135–145)

## 2017-01-14 LAB — HEMOGLOBIN: HEMOGLOBIN: 12.4 g/dL (ref 12.0–16.0)

## 2017-01-14 SURGERY — COLONOSCOPY WITH PROPOFOL
Anesthesia: General

## 2017-01-14 MED ORDER — FENTANYL CITRATE (PF) 100 MCG/2ML IJ SOLN
INTRAMUSCULAR | Status: AC
  Filled 2017-01-14: qty 2

## 2017-01-14 MED ORDER — SODIUM CHLORIDE 0.9 % IV SOLN
INTRAVENOUS | Status: DC
  Administered 2017-01-14: 1000 mL via INTRAVENOUS

## 2017-01-14 MED ORDER — GLYCOPYRROLATE 0.2 MG/ML IJ SOLN
INTRAMUSCULAR | Status: AC
  Filled 2017-01-14: qty 1

## 2017-01-14 MED ORDER — LIDOCAINE HCL (PF) 2 % IJ SOLN
INTRAMUSCULAR | Status: AC
  Filled 2017-01-14: qty 2

## 2017-01-14 MED ORDER — PROPOFOL 10 MG/ML IV BOLUS
INTRAVENOUS | Status: DC | PRN
  Administered 2017-01-14: 70 mg via INTRAVENOUS
  Administered 2017-01-14: 20 mg via INTRAVENOUS

## 2017-01-14 MED ORDER — FENTANYL CITRATE (PF) 100 MCG/2ML IJ SOLN
INTRAMUSCULAR | Status: DC | PRN
  Administered 2017-01-14: 50 ug via INTRAVENOUS

## 2017-01-14 MED ORDER — NICOTINE 21 MG/24HR TD PT24
21.0000 mg | MEDICATED_PATCH | Freq: Every day | TRANSDERMAL | Status: DC
  Filled 2017-01-14: qty 1

## 2017-01-14 MED ORDER — PROPOFOL 500 MG/50ML IV EMUL
INTRAVENOUS | Status: DC | PRN
  Administered 2017-01-14: 150 ug/kg/min via INTRAVENOUS

## 2017-01-14 NOTE — Anesthesia Post-op Follow-up Note (Cosign Needed)
Anesthesia QCDR form completed.        

## 2017-01-14 NOTE — Consult Note (Signed)
[facility]  Date of admission:  01/13/2017  Inpatient day:  01/14/2017  Consulting physician: Dr. Dustin Flock   Reason for Consultation:  Rectal mass, likely tumor  Chief Complaint: Tamara Hall is a 61 y.o. female with no significant medical history who presented with rectal bleeding and imaging worrisome for rectal cancer with liver metastasis.  HPI:  The patient notes no prior history of colonoscopy.  She has had rectal bleeding for 9 months.  She has struggled with constipation.  She presented with a 3 day history of significant rectal bleeding (passing large clots) and some lower abdominal discomfort.  Abdomen and pelvic CT scan on 01/13/2017 revealed irregular thickening of the rectum suspicious for carcinoma.  There was a 1.6 cm hypoattenuating lesion in the right hepatic lobe worrisome for metastatic disease.  There was an incidental 3.2 cm infrarenal abdominal aortic aneurysm.  Labs included a normal CBC and CMP.  She underwent colonoscopy by Dr. Allen Norris today.  Digital rectal exam revealed no mass.  There was a frond-like/villous non-obstructing large mass in the rectum. The mass was non-circumferential.  Biopsies are pending.  Symptomatically, she has been fatigued for 2 months.  She denies any weight loss.  She has had bloating and constipation.  She denies any nausea, vomiting or diarrhea.   She has not had a mammogram in years.  She denies any family history of colon cancer.  Her parents had lung cancer.  Her maternal grandmother had a brain tumor.   Past Medical History:  Diagnosis Date  . Hypertension     Past Surgical History:  Procedure Laterality Date  . ABDOMINAL HYSTERECTOMY    . BREAST SURGERY    . COLONOSCOPY WITH PROPOFOL N/A 01/14/2017   Procedure: COLONOSCOPY WITH PROPOFOL;  Surgeon: Lucilla Lame, MD;  Location: [facility] ENDOSCOPY;  Service: Endoscopy;  Laterality: N/A;  . JOINT REPLACEMENT    . TONSILLECTOMY      Family History   Problem Relation Age of Onset  . Lung cancer Mother   . Lung cancer Father   . Brain cancer Maternal Grandmother     Social History:  reports that she has been smoking Cigarettes.  She has a 80.00 pack-year smoking history. She has never used smokeless tobacco. She reports that she does not drink alcohol or use drugs.  She lives in [place].  She has 3 children (ages 26, 26, and 65).  She smokes 2 packs a day.  She started smoking at age 50.  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  She is alone today.  Allergies: No Known Allergies  No prescriptions prior to admission.    Review of Systems: GENERAL:  Fatigue x 2 months.  No fevers, sweats or weight loss. PERFORMANCE STATUS (ECOG):  0 HEENT:  No visual changes, runny nose, sore throat, mouth sores or tenderness. Lungs: No shortness of breath or cough.  No hemoptysis. Cardiac:  No chest pain, palpitations, orthopnea, or PND. GI:  Bloating.  Constipation.  Rectal bleeding.  No nausea, vomiting, diarrhea, or melena. GU:  No urgency, frequency, dysuria, or hematuria. Musculoskeletal:  No back pain.  No joint pain.  No muscle tenderness. Extremities:  No pain or swelling. Skin:  No rashes or skin changes. Neuro:  No headache, numbness or weakness, balance or coordination issues. Endocrine:  No diabetes, thyroid issues, hot flashes or night sweats. Psych:  No mood changes, depression or anxiety. Pain:  No focal pain. Review of systems:  All other systems reviewed and found to be negative.  Physical Exam:  Blood pressure (!) 114/45, pulse 81, temperature 98.6 F (37 C), resp. rate 20, height 5' 7"  (1.702 m), weight 160 lb (72.6 kg), SpO2 100 %.  GENERAL:  Well developed, well nourished, woman sitting comfortably on the medical unit in no acute distress. MENTAL STATUS:  Alert and oriented to person, place and time. HEAD:  Long gray hair in a pony tail.  Normocephalic, atraumatic,  face symmetric, no Cushingoid features. EYES:  Blue eyes.  Pupils equal round and reactive to light and accomodation.  No conjunctivitis or scleral icterus. ENT:  Oropharynx clear without lesion.  Tongue normal. Mucous membranes moist.  RESPIRATORY:  Clear to auscultation without rales, wheezes or rhonchi. CARDIOVASCULAR:  Regular rate and rhythm without murmur, rub or gallop. ABDOMEN:  Soft, slightly tender suprapubic area.  No guarding or rebound tenderness.  Active bowel sounds and no hepatosplenomegaly.  No masses. SKIN:  No rashes, ulcers or lesions. EXTREMITIES: No edema, no skin discoloration or tenderness.  No palpable cords. LYMPH NODES: No palpable cervical, supraclavicular, axillary or inguinal adenopathy  NEUROLOGICAL: Unremarkable. PSYCH:  Appropriate.   Results for orders placed or performed during the hospital encounter of 01/13/17 (from the past 48 hour(s))  Comprehensive metabolic panel     Status: Abnormal   Collection Time: 01/13/17  2:09 PM  Result Value Ref Range   Sodium 138 135 - 145 mmol/L   Potassium 3.9 3.5 - 5.1 mmol/L   Chloride 104 101 - 111 mmol/L   CO2 30 22 - 32 mmol/L   Glucose, Bld 96 65 - 99 mg/dL   BUN 12 6 - 20 mg/dL   Creatinine, Ser 0.72 0.44 - 1.00 mg/dL   Calcium 9.1 8.9 - 10.3 mg/dL   Total Protein 7.4 6.5 - 8.1 g/dL   Albumin 4.2 3.5 - 5.0 g/dL   AST 18 15 - 41 U/L   ALT 13 (L) 14 - 54 U/L   Alkaline Phosphatase 73 38 - 126 U/L   Total Bilirubin 0.2 (L) 0.3 - 1.2 mg/dL   GFR calc non Af Amer >60 >60 mL/min   GFR calc Af Amer >60 >60 mL/min    Comment: (NOTE) The eGFR has been calculated using the CKD EPI equation. This calculation has not been validated in all clinical situations. eGFR's persistently <60 mL/min signify possible Chronic Kidney Disease.    Anion gap 4 (L) 5 - 15  CBC     Status: Abnormal   Collection Time: 01/13/17  2:09 PM  Result Value Ref Range   WBC 11.1 (H) 3.6 - 11.0 K/uL   RBC 4.21 3.80 - 5.20 MIL/uL    Hemoglobin 12.9 12.0 - 16.0 g/dL   HCT 38.5 35.0 - 47.0 %   MCV 91.4 80.0 - 100.0 fL   MCH 30.6 26.0 - 34.0 pg   MCHC 33.5 32.0 - 36.0 g/dL   RDW 13.4 11.5 - 14.5 %   Platelets 326 150 - 440 K/uL  Urinalysis, Complete w Microscopic     Status: Abnormal   Collection Time: 01/13/17  2:33 PM  Result Value Ref Range   Color, Urine STRAW (A) YELLOW   APPearance HAZY (A) CLEAR   Specific Gravity, Urine 1.009 1.005 - 1.030   pH 6.0 5.0 - 8.0   Glucose, UA NEGATIVE NEGATIVE mg/dL   Hgb urine dipstick NEGATIVE NEGATIVE   Bilirubin Urine NEGATIVE NEGATIVE  Ketones, ur NEGATIVE NEGATIVE mg/dL   Protein, ur NEGATIVE NEGATIVE mg/dL   Nitrite NEGATIVE NEGATIVE   Leukocytes, UA TRACE (A) NEGATIVE   RBC / HPF 0-5 0 - 5 RBC/hpf   WBC, UA 0-5 0 - 5 WBC/hpf   Bacteria, UA NONE SEEN NONE SEEN   Squamous Epithelial / LPF 0-5 (A) NONE SEEN   Mucous PRESENT   Hemoglobin     Status: None   Collection Time: 01/13/17  7:16 PM  Result Value Ref Range   Hemoglobin 13.2 12.0 - 16.0 g/dL  Basic metabolic panel     Status: Abnormal   Collection Time: 01/14/17 12:54 AM  Result Value Ref Range   Sodium 137 135 - 145 mmol/L   Potassium 3.5 3.5 - 5.1 mmol/L   Chloride 104 101 - 111 mmol/L   CO2 25 22 - 32 mmol/L   Glucose, Bld 110 (H) 65 - 99 mg/dL   BUN 10 6 - 20 mg/dL   Creatinine, Ser 0.72 0.44 - 1.00 mg/dL   Calcium 8.6 (L) 8.9 - 10.3 mg/dL   GFR calc non Af Amer >60 >60 mL/min   GFR calc Af Amer >60 >60 mL/min    Comment: (NOTE) The eGFR has been calculated using the CKD EPI equation. This calculation has not been validated in all clinical situations. eGFR's persistently <60 mL/min signify possible Chronic Kidney Disease.    Anion gap 8 5 - 15  CBC     Status: None   Collection Time: 01/14/17 12:54 AM  Result Value Ref Range   WBC 8.4 3.6 - 11.0 K/uL   RBC 4.00 3.80 - 5.20 MIL/uL   Hemoglobin 12.2 12.0 - 16.0 g/dL   HCT 35.7 35.0 - 47.0 %   MCV 89.3 80.0 - 100.0 fL   MCH 30.6 26.0 - 34.0  pg   MCHC 34.2 32.0 - 36.0 g/dL   RDW 13.3 11.5 - 14.5 %   Platelets 309 150 - 440 K/uL  Hemoglobin     Status: None   Collection Time: 01/14/17  6:50 AM  Result Value Ref Range   Hemoglobin 12.4 12.0 - 16.0 g/dL   Ct Abdomen Pelvis W Contrast  Result Date: 01/13/2017 CLINICAL DATA:  Rectal bleeding and abdominal pain for 9 months. Bleeding is markedly worsened over the past 3 days. EXAM: CT ABDOMEN AND PELVIS WITH CONTRAST TECHNIQUE: Multidetector CT imaging of the abdomen and pelvis was performed using the standard protocol following bolus administration of intravenous contrast. CONTRAST:  100 ml ISOVUE-300 IOPAMIDOL (ISOVUE-300) INJECTION 61% COMPARISON:  None. FINDINGS: Lower chest: No pleural or pericardial effusion. Punctate calcified granulomata are seen in the lower lobes bilaterally. Hepatobiliary: A 1.3 by 1.6 cm hypoattenuating lesion is seen on image 13 in the right hepatic lobe. A second punctate hypoattenuating lesion is identified on image 19. Multiple small calcifications are seen throughout the liver. The gallbladder and biliary tree appear normal. Pancreas: Unremarkable. No pancreatic ductal dilatation or surrounding inflammatory changes. Spleen: No acute or focal abnormality. Normal in size. Multiple calcifications in the spleen are consistent with old granulomatous disease. Adrenals/Urinary Tract: Adrenal glands are unremarkable. Kidneys are normal, without renal calculi, focal lesion, or hydronephrosis. Bladder is unremarkable. Stomach/Bowel: Irregular wall thickening in the rectum is suspicious for rectal mass. The colon otherwise appears normal. No evidence of appendicitis is seen. The stomach and small bowel appear normal. Vascular/Lymphatic: There is extensive aortoiliac atherosclerosis. Infrarenal aortic aneurysm measures 3.2 x 3.0 cm. Small pelvic sidewall lymph nodes are  seen bilaterally. No pathologic lymphadenopathy by CT size criteria is identified. Reproductive:  Status post  hysterectomy.  No adnexal masses. Other: No fluid collection. Musculoskeletal: No lytic or sclerotic lesion. IMPRESSION: Irregular wall thickening of the rectum is suspicious for carcinoma in this patient with rectal bleeding. 1.6 cm lesion in the right hepatic lobe is worrisome for metastatic disease. The appearance this lesion is not typical of a cyst or hemangioma. 3.2 cm infrarenal abdominal aortic aneurysm. Recommend followup by ultrasound in 3 years. This recommendation follows ACR consensus guidelines: White Paper of the ACR Incidental Findings Committee II on Vascular Findings. Joellyn Rued Radiol 2013; [phone] Old granulomatous disease. Electronically Signed   By: Inge Rise M.D.   On: 01/13/2017 16:06    Assessment:  The patient is a 61 y.o. woman with probable rectal cancer.   She presented with a 9 month history of rectal bleeding.  Abdomen and pelvic CT on 01/13/2017 revealed irregular thickening of the rectum suspicious for carcinoma.  There was a 1.6 cm hypoattenuating lesion in the right hepatic lobe worrisome for metastatic disease.  Colonoscopy on 01/14/2017 revealed a frond-like/villous non-obstructing large mass in the rectum. The mass was non-circumferential.  Biopsies are pending.  Symptomatically, she note a 2 month history of fatigue.  Exam is unremarkable.  Plan:   1.  Oncology:  Discuss results of CT scan and recent colonoscopy.  Discuss concern for rectal cancer.  Await biopsy.  Discuss plan for multidisciplinary approach (surgery, radiation, chemotherapy).  Discuss plans for labs (CEA) and chest CT.  Discuss plan for pelvic MRI with contrast to assess depth of tumor penetration, lymph node involvement, and circumferential margin.  Discuss staging of rectal cancer.  Discuss possible metastatic disease to the liver.  Anticipate outpatient PET scan.  If liver lesion proves to be an isolated site of metastasis, discuss potential resection (staged or synchronous with rectal  lesion).  Discuss chemotherapy (FOLFOX) and/or concurrent chemotherapy (Xeloda or 5FU) with radiation.  Discuss presentation at tumor board.  Anticipate sending tumor for MMR/MSI, KRAS/NRAS, and BRAF.   Thank you for allowing me to participate in MARKIESHA DELIA 's care.  I will follow her closely with you while hospitalized and after discharge in the outpatient department.   Lequita Asal, MD  01/14/2017, 10:48 PM

## 2017-01-14 NOTE — Anesthesia Procedure Notes (Signed)
Performed by: Emric Kowalewski Pre-anesthesia Checklist: Patient identified, Emergency Drugs available, Suction available, Patient being monitored and Timeout performed Patient Re-evaluated:Patient Re-evaluated prior to inductionOxygen Delivery Method: Nasal cannula Preoxygenation: Pre-oxygenation with 100% oxygen Intubation Type: IV induction       

## 2017-01-14 NOTE — Progress Notes (Signed)
I was present this morning and discussed with Dr. Allen Norris the patient's findings of a low rectal cancer which is quite large and had clots upon it. This was during the colonoscopy.  I discussed with the patient these findings and that she would likely undergo chemotherapy and radiation therapy but that would not be occurring until after the biopsy results are back. At this point there would be no surgical intervention for this patient and if there were surgical intervention options available it would be through a colorectal service and I suggested that we refer to the colorectal surgeons at [facility] in [place] but the patient prefers to go to [facility]. This could all be done as an outpatient and pending the results of the biopsy she could be discharged.

## 2017-01-14 NOTE — Anesthesia Preprocedure Evaluation (Signed)
Anesthesia Evaluation  Patient identified by MRN, date of birth, ID band Patient awake    Reviewed: Allergy & Precautions, H&P , NPO status , Patient's Chart, lab work & pertinent test results  History of Anesthesia Complications Negative for: history of anesthetic complications  Airway Mallampati: III  TM Distance: >3 FB Neck ROM: limited    Dental  (+) Poor Dentition, Chipped, Missing, Upper Dentures, Partial Lower   Pulmonary neg shortness of breath, COPD, Current Smoker,    Pulmonary exam normal breath sounds clear to auscultation       Cardiovascular Exercise Tolerance: Good hypertension, (-) angina(-) Past MI and (-) DOE Normal cardiovascular exam Rhythm:regular Rate:Normal     Neuro/Psych negative neurological ROS  negative psych ROS   GI/Hepatic negative GI ROS, Neg liver ROS,   Endo/Other  negative endocrine ROS  Renal/GU negative Renal ROS  negative genitourinary   Musculoskeletal   Abdominal   Peds  Hematology negative hematology ROS (+)   Anesthesia Other Findings Signs and symptoms suggestive of sleep apnea   Past Medical History: No date: Hypertension  Past Surgical History: No date: ABDOMINAL HYSTERECTOMY No date: BREAST SURGERY No date: JOINT REPLACEMENT No date: TONSILLECTOMY  BMI    Body Mass Index:  25.06 kg/m      Reproductive/Obstetrics negative OB ROS                             Anesthesia Physical Anesthesia Plan  ASA: III  Anesthesia Plan: General   Post-op Pain Management:    Induction:   Airway Management Planned:   Additional Equipment:   Intra-op Plan:   Post-operative Plan:   Informed Consent: I have reviewed the patients History and Physical, chart, labs and discussed the procedure including the risks, benefits and alternatives for the proposed anesthesia with the patient or authorized representative who has indicated his/her  understanding and acceptance.   Dental Advisory Given  Plan Discussed with: Anesthesiologist, CRNA and Surgeon  Anesthesia Plan Comments:         Anesthesia Quick Evaluation

## 2017-01-14 NOTE — Progress Notes (Signed)
[place] at [facility]                                                                                                                                                                                  Patient Demographics   Tamara Hall, is a 61 y.o. female, DOB - 11-03-1955, PYP:[id number]  Admit date - 01/13/2017   Admitting Physician Henreitta Leber, MD  Outpatient Primary MD for the patient is No PCP Per Patient   LOS - 1  Subjective: Patient admitted with rectal bleed she is awaiting sigmoidoscopy    Review of Systems:   CONSTITUTIONAL: No documented fever. No fatigue, weakness. No weight gain, no weight loss.  EYES: No blurry or double vision.  ENT: No tinnitus. No postnasal drip. No redness of the oropharynx.  RESPIRATORY: No cough, no wheeze, no hemoptysis. No dyspnea.  CARDIOVASCULAR: No chest pain. No orthopnea. No palpitations. No syncope.  GASTROINTESTINAL: No nausea, no vomiting or diarrhea. No abdominal pain. Bright red blood per rectum GENITOURINARY: No dysuria or hematuria.  ENDOCRINE: No polyuria or nocturia. No heat or cold intolerance.  HEMATOLOGY: No anemia. No bruising. No bleeding.  INTEGUMENTARY: No rashes. No lesions.  MUSCULOSKELETAL: No arthritis. No swelling. No gout.  NEUROLOGIC: No numbness, tingling, or ataxia. No seizure-type activity.  PSYCHIATRIC: No anxiety. No insomnia. No ADD.    Vitals:   Vitals:   01/14/17 1203 01/14/17 1213 01/14/17 1223 01/14/17 1233  BP: 117/83 126/62 (!) 120/57 (!) 119/54  Pulse: 75 67 66 63  Resp: '12 14 14 14  '$ Temp:      TempSrc:      SpO2: 100% 100% 100% 100%  Weight:      Height:        Wt Readings from Last 3 Encounters:  01/13/17 160 lb (72.6 kg)    No intake or output data in the 24 hours ending 01/14/17 1309  Physical Exam:   GENERAL: Pleasant-appearing in no apparent distress.  HEAD, EYES, EARS, NOSE AND THROAT: Atraumatic, normocephalic. Extraocular muscles are intact.  Pupils equal and reactive to light. Sclerae anicteric. No conjunctival injection. No oro-pharyngeal erythema.  NECK: Supple. There is no jugular venous distention. No bruits, no lymphadenopathy, no thyromegaly.  HEART: Regular rate and rhythm,. No murmurs, no rubs, no clicks.  LUNGS: Clear to auscultation bilaterally. No rales or rhonchi. No wheezes.  ABDOMEN: Soft, flat, nontender, nondistended. Has good bowel sounds. No hepatosplenomegaly appreciated.  EXTREMITIES: No evidence of any cyanosis, clubbing, or peripheral edema.  +2 pedal and radial pulses bilaterally.  NEUROLOGIC: The patient is alert, awake, and oriented x3 with no focal motor or sensory deficits appreciated bilaterally.  SKIN: Moist and warm with no rashes appreciated.  Psych: Not anxious, depressed LN: No inguinal LN enlargement    Antibiotics   Anti-infectives    None      Medications   Scheduled Meds: . pneumococcal 23 valent vaccine  0.5 mL Intramuscular Tomorrow-1000   Continuous Infusions: PRN Meds:.acetaminophen **OR** acetaminophen, hydrALAZINE, ondansetron **OR** ondansetron (ZOFRAN) IV   Data Review:   Micro Results No results found for this or any previous visit (from the past 240 hour(s)).  Radiology Reports Ct Abdomen Pelvis W Contrast  Result Date: 01/13/2017 CLINICAL DATA:  Rectal bleeding and abdominal pain for 9 months. Bleeding is markedly worsened over the past 3 days. EXAM: CT ABDOMEN AND PELVIS WITH CONTRAST TECHNIQUE: Multidetector CT imaging of the abdomen and pelvis was performed using the standard protocol following bolus administration of intravenous contrast. CONTRAST:  100 ml ISOVUE-300 IOPAMIDOL (ISOVUE-300) INJECTION 61% COMPARISON:  None. FINDINGS: Lower chest: No pleural or pericardial effusion. Punctate calcified granulomata are seen in the lower lobes bilaterally. Hepatobiliary: A 1.3 by 1.6 cm hypoattenuating lesion is seen on image 13 in the right hepatic lobe. A second punctate  hypoattenuating lesion is identified on image 19. Multiple small calcifications are seen throughout the liver. The gallbladder and biliary tree appear normal. Pancreas: Unremarkable. No pancreatic ductal dilatation or surrounding inflammatory changes. Spleen: No acute or focal abnormality. Normal in size. Multiple calcifications in the spleen are consistent with old granulomatous disease. Adrenals/Urinary Tract: Adrenal glands are unremarkable. Kidneys are normal, without renal calculi, focal lesion, or hydronephrosis. Bladder is unremarkable. Stomach/Bowel: Irregular wall thickening in the rectum is suspicious for rectal mass. The colon otherwise appears normal. No evidence of appendicitis is seen. The stomach and small bowel appear normal. Vascular/Lymphatic: There is extensive aortoiliac atherosclerosis. Infrarenal aortic aneurysm measures 3.2 x 3.0 cm. Small pelvic sidewall lymph nodes are seen bilaterally. No pathologic lymphadenopathy by CT size criteria is identified. Reproductive:  Status post hysterectomy.  No adnexal masses. Other: No fluid collection. Musculoskeletal: No lytic or sclerotic lesion. IMPRESSION: Irregular wall thickening of the rectum is suspicious for carcinoma in this patient with rectal bleeding. 1.6 cm lesion in the right hepatic lobe is worrisome for metastatic disease. The appearance this lesion is not typical of a cyst or hemangioma. 3.2 cm infrarenal abdominal aortic aneurysm. Recommend followup by ultrasound in 3 years. This recommendation follows ACR consensus guidelines: White Paper of the ACR Incidental Findings Committee II on Vascular Findings. Joellyn Rued Radiol 2013; [id number] Old granulomatous disease. Electronically Signed   By: Inge Rise M.D.   On: 01/13/2017 16:06     CBC  Recent Labs Lab 01/13/17 1409 01/13/17 1916 01/14/17 0054 01/14/17 0650  WBC 11.1*  --  8.4  --   HGB 12.9 13.2 12.2 12.4  HCT 38.5  --  35.7  --   PLT 326  --  309  --   MCV 91.4   --  89.3  --   MCH 30.6  --  30.6  --   MCHC 33.5  --  34.2  --   RDW 13.4  --  13.3  --     Chemistries   Recent Labs Lab 01/13/17 1409 01/14/17 0054  NA 138 137  K 3.9 3.5  CL 104 104  CO2 30 25  GLUCOSE 96 110*  BUN 12 10  CREATININE 0.72 0.72  CALCIUM 9.1 8.6*  AST 18  --   ALT 13*  --   ALKPHOS 73  --  BILITOT 0.2*  --    ------------------------------------------------------------------------------------------------------------------ estimated creatinine clearance is 71.8 mL/min (by C-G formula based on SCr of 0.72 mg/dL). ------------------------------------------------------------------------------------------------------------------ No results for input(s): HGBA1C in the last 72 hours. ------------------------------------------------------------------------------------------------------------------ No results for input(s): CHOL, HDL, LDLCALC, TRIG, CHOLHDL, LDLDIRECT in the last 72 hours. ------------------------------------------------------------------------------------------------------------------ No results for input(s): TSH, T4TOTAL, T3FREE, THYROIDAB in the last 72 hours.  Invalid input(s): FREET3 ------------------------------------------------------------------------------------------------------------------ No results for input(s): VITAMINB12, FOLATE, FERRITIN, TIBC, IRON, RETICCTPCT in the last 72 hours.  Coagulation profile No results for input(s): INR, PROTIME in the last 168 hours.  No results for input(s): DDIMER in the last 72 hours.  Cardiac Enzymes No results for input(s): CKMB, TROPONINI, MYOGLOBIN in the last 168 hours.  Invalid input(s): CK ------------------------------------------------------------------------------------------------------------------ Invalid input(s): POCBNP    Assessment & Plan   61 year old female with no significant past medical history presents to the hospital but rectal bleeding.  1. Rectal bleeding  sigmoidoscopy shows malignant tumor in rectum.    2. Rectal thickening with a hepatic lesion-suspicious for underlying rectal cancer which is metastatic. D/w dr. Burt Knack he states pt will need colorectal surgeon to evaluate the patient.  3. Nicotine abuse: smoking cessation provided 3 min spent start patient on nicotine replacment     Code Status Orders        Start     Ordered   01/13/17 1858  Full code  Continuous     01/13/17 1857    Code Status History    Date Active Date Inactive Code Status Order ID Comments User Context   This patient has a current code status but no historical code status.           Consults  gi DVT Prophylaxis scd's  Lab Results  Component Value Date   PLT 309 01/14/2017     Time Spent in minutes   42mn  Greater than 50% of time spent in care coordination and counseling patient regarding the condition and plan of care.   PDustin FlockM.D on 01/14/2017 at 1:09 PM  Between 7am to 6pm - Pager - [phone]  After 6pm go to [email or web address] - password [email or web address]   Office  [phone]

## 2017-01-14 NOTE — Transfer of Care (Signed)
Immediate Anesthesia Transfer of Care Note  Patient: [name]  Procedure(s) Performed: Procedure(s): COLONOSCOPY WITH PROPOFOL (N/A)  Patient Location: PACU  Anesthesia Type:General  Level of Consciousness: sedated and responds to stimulation  Airway & Oxygen Therapy: Patient Spontanous Breathing and Patient connected to nasal cannula oxygen  Post-op Assessment: Report given to RN and Post -op Vital signs reviewed and stable  Post vital signs: Reviewed and stable  Last Vitals:  Vitals:   01/14/17 0417 01/14/17 1144  BP: 121/63 (!) 111/53  Pulse: 66 75  Resp: 20 16  Temp: 36.9 C     Last Pain:  Vitals:   01/14/17 0417  TempSrc: Oral  PainSc:          Complications: No apparent anesthesia complications

## 2017-01-14 NOTE — Anesthesia Postprocedure Evaluation (Signed)
Anesthesia Post Note  Patient: Tamara Hall  Procedure(s) Performed: Procedure(s) (LRB): COLONOSCOPY WITH PROPOFOL (N/A)  Patient location during evaluation: Endoscopy Anesthesia Type: General Level of consciousness: awake and alert Pain management: pain level controlled Vital Signs Assessment: post-procedure vital signs reviewed and stable Respiratory status: spontaneous breathing, nonlabored ventilation, respiratory function stable and patient connected to nasal cannula oxygen Cardiovascular status: blood pressure returned to baseline and stable Postop Assessment: no signs of nausea or vomiting Anesthetic complications: no     Last Vitals:  Vitals:   01/14/17 1223 01/14/17 1233  BP: (!) 120/57 (!) 119/54  Pulse: 66 63  Resp: 14 14  Temp:      Last Pain:  Vitals:   01/14/17 1143  TempSrc: Temporal  PainSc:                  Precious Haws Clinten Howk

## 2017-01-14 NOTE — Op Note (Signed)
[facility] Gastroenterology Patient Name: Tamara Hall Procedure Date: 01/14/2017 11:08 AM MRN: [id number] Account #: [id number] Date of Birth: 08/26/1956 Admit Type: Outpatient Age: 61 Room: [facility] ENDO ROOM 4 Gender: Female Note Status: Finalized Procedure:            Colonoscopy Indications:          Abnormal CT of the GI tract Providers:            Lucilla Lame MD, MD Referring MD:         No Local Md, MD (Referring MD) Medicines:            Propofol per Anesthesia Complications:        No immediate complications. Procedure:            Pre-Anesthesia Assessment:                       - Prior to the procedure, a History and Physical was                        performed, and patient medications and allergies were                        reviewed. The patient's tolerance of previous                        anesthesia was also reviewed. The risks and benefits of                        the procedure and the sedation options and risks were                        discussed with the patient. All questions were                        answered, and informed consent was obtained. Prior                        Anticoagulants: The patient has taken no previous                        anticoagulant or antiplatelet agents. ASA Grade                        Assessment: II - A patient with mild systemic disease.                        After reviewing the risks and benefits, the patient was                        deemed in satisfactory condition to undergo the                        procedure.                       After obtaining informed consent, the colonoscope was                        passed under direct vision. Throughout the procedure,  the patient's blood pressure, pulse, and oxygen                        saturations were monitored continuously. The                        Colonoscope was introduced through the anus and                        advanced to  the the cecum, identified by appendiceal                        orifice and ileocecal valve. The colonoscopy was                        performed without difficulty. The patient tolerated the                        procedure well. The quality of the bowel preparation                        was good. Findings:      The perianal and digital rectal examinations were normal.      A frond-like/villous non-obstructing large mass was found in the rectum.       The mass was non-circumferential. Oozing was present. This was biopsied       with a cold forceps for histology.      The exam was otherwise without abnormality.      A few small-mouthed diverticula were found in the sigmoid colon. Impression:           - Likely malignant tumor in the rectum. Biopsied.                       - The examination was otherwise normal.                       - Diverticulosis in the sigmoid colon. Recommendation:       - Return patient to hospital ward for ongoing care. Procedure Code(s):    --- Professional ---                       [phone], Colonoscopy, flexible; with biopsy, single or                        multiple Diagnosis Code(s):    --- Professional ---                       R93.3, Abnormal findings on diagnostic imaging of other                        parts of digestive tract                       D49.0, Neoplasm of unspecified behavior of digestive                        system CPT copyright 2016 American Medical Association. All rights reserved. The codes documented in this report are preliminary and upon coder review may  be revised to meet current compliance requirements. Lucilla Lame MD, MD  01/14/2017 11:38:42 AM This report has been signed electronically. Number of Addenda: 0 Note Initiated On: 01/14/2017 11:08 AM Scope Withdrawal Time: 0 hours 8 minutes 27 seconds  Total Procedure Duration: 0 hours 12 minutes 43 seconds       [facility]

## 2017-01-15 ENCOUNTER — Inpatient Hospital Stay: Payer: Managed Care, Other (non HMO)

## 2017-01-15 LAB — CBC WITH DIFFERENTIAL/PLATELET
Basophils Absolute: 0 10*3/uL (ref 0–0.1)
Basophils Relative: 0 %
Eosinophils Absolute: 0.1 10*3/uL (ref 0–0.7)
Eosinophils Relative: 2 %
HCT: 38.5 % (ref 35.0–47.0)
Hemoglobin: 12.8 g/dL (ref 12.0–16.0)
Lymphocytes Relative: 24 %
Lymphs Abs: 1.7 10*3/uL (ref 1.0–3.6)
MCH: 30.3 pg (ref 26.0–34.0)
MCHC: 33.4 g/dL (ref 32.0–36.0)
MCV: 90.9 fL (ref 80.0–100.0)
Monocytes Absolute: 0.4 10*3/uL (ref 0.2–0.9)
Monocytes Relative: 6 %
Neutro Abs: 4.8 10*3/uL (ref 1.4–6.5)
Neutrophils Relative %: 68 %
Platelets: 332 10*3/uL (ref 150–440)
RBC: 4.24 MIL/uL (ref 3.80–5.20)
RDW: 13.4 % (ref 11.5–14.5)
WBC: 7.1 10*3/uL (ref 3.6–11.0)

## 2017-01-15 LAB — SURGICAL PATHOLOGY

## 2017-01-15 LAB — IRON AND TIBC
Iron: 37 ug/dL (ref 28–170)
Saturation Ratios: 12 % (ref 10.4–31.8)
TIBC: 320 ug/dL (ref 250–450)
UIBC: 284 ug/dL

## 2017-01-15 LAB — HIV ANTIBODY (ROUTINE TESTING W REFLEX): HIV Screen 4th Generation wRfx: NONREACTIVE

## 2017-01-15 LAB — FERRITIN: Ferritin: 30 ng/mL (ref 11–307)

## 2017-01-15 MED ORDER — HYDROMORPHONE HCL 1 MG/ML IJ SOLN
1.0000 mg | INTRAMUSCULAR | Status: DC | PRN
  Administered 2017-01-15: 1 mg via INTRAVENOUS
  Administered 2017-01-15 – 2017-01-16 (×4): 2 mg via INTRAVENOUS
  Filled 2017-01-15 (×5): qty 2

## 2017-01-15 MED ORDER — OXYCODONE-ACETAMINOPHEN 5-325 MG PO TABS: 1.0000 | ORAL_TABLET | ORAL | 0 refills | Status: DC | PRN

## 2017-01-15 MED ORDER — FLEET ENEMA 7-19 GM/118ML RE ENEM
1.0000 | ENEMA | Freq: Once | RECTAL | Status: AC
  Administered 2017-01-16: 08:00:00 1 via RECTAL

## 2017-01-15 MED ORDER — DOCUSATE SODIUM 100 MG PO CAPS: 100.0000 mg | ORAL_CAPSULE | Freq: Two times a day (BID) | ORAL | 0 refills | Status: DC

## 2017-01-15 MED ORDER — ACETAMINOPHEN 325 MG PO TABS: 650.0000 mg | ORAL_TABLET | Freq: Four times a day (QID) | ORAL | Status: DC | PRN

## 2017-01-15 MED ORDER — NICOTINE 21 MG/24HR TD PT24: 21.0000 mg | MEDICATED_PATCH | Freq: Every day | TRANSDERMAL | 0 refills | Status: DC

## 2017-01-15 NOTE — Progress Notes (Signed)
[facility] at [place] was admitted to the Hospital on 01/13/2017 and Discharged  01/15/2017 and should be excused from work/school   for 14  days starting 01/13/2017 , may return to work/school without any restrictions.  Call Dustin Flock MD with questions.  Dustin Flock M.D on 01/15/2017,at 11:13 AM  [place] at [facility]  [phone]

## 2017-01-15 NOTE — Discharge Instructions (Signed)
[facility] [place] [facility] ° °DIET:  °Regular diet ° °DISCHARGE CONDITION:  °Stable ° °ACTIVITY:  °Activity as tolerated ° °OXYGEN:  °Home Oxygen: No. °  °Oxygen Delivery: room air ° °DISCHARGE LOCATION:  °home  ° ° °ADDITIONAL DISCHARGE INSTRUCTION: ° ° °If you experience worsening of your admission symptoms, develop shortness of breath, life threatening emergency, suicidal or homicidal thoughts you must seek medical attention immediately by calling 911 or calling your MD immediately  if symptoms less severe. ° °You Must read complete instructions/literature along with all the possible adverse reactions/side effects for all the Medicines you take and that have been prescribed to you. Take any new Medicines after you have completely understood and accpet all the possible adverse reactions/side effects.  ° °Please note ° °You were cared for by a hospitalist during your hospital stay. If you have any questions about your discharge medications or the care you received while you were in the hospital after you are discharged, you can call the unit and asked to speak with the hospitalist on call if the hospitalist that took care of you is not available. Once you are discharged, your primary care physician will handle any further medical issues. Please note that NO REFILLS for any discharge medications will be authorized once you are discharged, as it is imperative that you return to your primary care physician (or establish a relationship with a primary care physician if you do not have one) for your aftercare needs so that they can reassess your need for medications and monitor your lab values. ° ° °

## 2017-01-15 NOTE — Discharge Summary (Deleted)
Tamara Hall at [place], 61 y.o., DOB [birth year], MRN [id number]. Admission date: 01/13/2017 Discharge Date 01/15/2017 Primary MD No PCP Per Patient Admitting Physician Henreitta Leber, MD  Admission Diagnosis  Rectal bleeding [K62.5] Rectal cancer ([place]) [C20]  Discharge Diagnosis   Active Problems:   Rectal bleeding   Presumed Rectal cancer ([place]) await final biopsy from sigmoidoscopy   Liver lesion and pulmonary lesion possibly related to metastatic disease   Nicotine abuse            Hospital Course  Tamara Hall  is a 61 y.o. female with No known past medical history who presents to the hospital due to rectal bleeding with clots. Patient says she's been having rectal bleeding on and off now for the past 9 months with some occasional mucoid stool. Patient finally decided to come to the emergency room because of increasing bleeding. She had a CT scan of the abdomen and pelvis which showed a possible rectal mass with hepatic lesion. Patient underwent a colonoscopy which showed likely malignant tumor in the rectum biopsied. She was also seen by oncology and they recommended a pelvic MRI which patient will undergo prior to her discharge. She'll need to follow-up with oncology next week for further workup and therapy. Her hemoglobins remained stable.           Consults  oncology Significant Tests:  See full reports for all details     Ct Chest Wo Contrast  Result Date: 01/15/2017 CLINICAL DATA:  Rectal cancer with metastatic disease. EXAM: CT CHEST WITHOUT CONTRAST TECHNIQUE: Multidetector CT imaging of the chest was performed following the standard protocol without IV contrast. COMPARISON:  06/26/2009 FINDINGS: Cardiovascular: The heart size is normal. No pericardial effusion. Coronary artery calcification is noted. Atherosclerotic calcification is noted in the wall of the thoracic aorta. Mediastinum/Nodes: Calcified lymph nodes identified in  the both hila and subcarinal station. No mediastinal lymphadenopathy. No evidence for gross hilar lymphadenopathy although assessment is limited by the lack of intravenous contrast on today's study. The esophagus has normal imaging features. There is no axillary lymphadenopathy. Lungs/Pleura: Emphysema. Bronchial wall thickening. Biapical pleural-parenchymal scarring. Calcified granuloma noted right lower lobe. Tiny calcified granuloma noted left upper lobe. 9 mm noncalcified nodule left lower lobe adjacent to the heart (image 93 series 3) is new in the interval since the prior study. Upper Abdomen: Calcified granulomata noted in the liver and spleen. 16 mm left liver lesion again noted, as seen on the abdomen CT from 2 days ago. Musculoskeletal: Bone windows reveal no worrisome lytic or sclerotic osseous lesions. IMPRESSION: 1. 9 mm noncalcified left lower lobe pulmonary nodule is new since 06/26/2009. Given the history of colorectal cancer with liver metastases, metastatic disease to the left lower lobe is a concern. Close attention on follow-up recommended. 2. 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 3. Emphysema. 4. Coronary artery atherosclerosis. Electronically Signed   By: Misty Stanley M.D.   On: 01/15/2017 09:41   Ct Abdomen Pelvis W Contrast  Result Date: 01/13/2017 CLINICAL DATA:  Rectal bleeding and abdominal pain for 9 months. Bleeding is markedly worsened over the past 3 days. EXAM: CT ABDOMEN AND PELVIS WITH CONTRAST TECHNIQUE: Multidetector CT imaging of the abdomen and pelvis was performed using the standard protocol following bolus administration of intravenous contrast. CONTRAST:  100 ml ISOVUE-300 IOPAMIDOL (ISOVUE-300) INJECTION 61% COMPARISON:  None. FINDINGS: Lower chest: No pleural or pericardial effusion. Punctate calcified granulomata are seen in the lower lobes  bilaterally. Hepatobiliary: A 1.3 by 1.6 cm hypoattenuating lesion is seen on image 13 in the right hepatic lobe. A second punctate  hypoattenuating lesion is identified on image 19. Multiple small calcifications are seen throughout the liver. The gallbladder and biliary tree appear normal. Pancreas: Unremarkable. No pancreatic ductal dilatation or surrounding inflammatory changes. Spleen: No acute or focal abnormality. Normal in size. Multiple calcifications in the spleen are consistent with old granulomatous disease. Adrenals/Urinary Tract: Adrenal glands are unremarkable. Kidneys are normal, without renal calculi, focal lesion, or hydronephrosis. Bladder is unremarkable. Stomach/Bowel: Irregular wall thickening in the rectum is suspicious for rectal mass. The colon otherwise appears normal. No evidence of appendicitis is seen. The stomach and small bowel appear normal. Vascular/Lymphatic: There is extensive aortoiliac atherosclerosis. Infrarenal aortic aneurysm measures 3.2 x 3.0 cm. Small pelvic sidewall lymph nodes are seen bilaterally. No pathologic lymphadenopathy by CT size criteria is identified. Reproductive:  Status post hysterectomy.  No adnexal masses. Other: No fluid collection. Musculoskeletal: No lytic or sclerotic lesion. IMPRESSION: Irregular wall thickening of the rectum is suspicious for carcinoma in this patient with rectal bleeding. 1.6 cm lesion in the right hepatic lobe is worrisome for metastatic disease. The appearance this lesion is not typical of a cyst or hemangioma. 3.2 cm infrarenal abdominal aortic aneurysm. Recommend followup by ultrasound in 3 years. This recommendation follows ACR consensus guidelines: White Paper of the ACR Incidental Findings Committee II on Vascular Findings. Joellyn Rued Radiol 2013; [phone] Old granulomatous disease. Electronically Signed   By: Inge Rise M.D.   On: 01/13/2017 16:06       Today   Subjective:   Tamara Hall  has some abdominal pain but denies any nausea vomiting or diarrhea  Objective:   Blood pressure 109/63, pulse 74, temperature 98 F (36.7 C),  temperature source Oral, resp. rate 20, height '5\' 7"'$  (1.702 m), weight 160 lb (72.6 kg), SpO2 99 %.  .  Intake/Output Summary (Last 24 hours) at 01/15/17 1147 Last data filed at 01/15/17 0800  Gross per 24 hour  Intake              240 ml  Output                0 ml  Net              240 ml    Exam VITAL SIGNS: Blood pressure 109/63, pulse 74, temperature 98 F (36.7 C), temperature source Oral, resp. rate 20, height '5\' 7"'$  (1.702 m), weight 160 lb (72.6 kg), SpO2 99 %.  GENERAL:  61 y.o.-year-old patient lying in the bed with no acute distress.  EYES: Pupils equal, round, reactive to light and accommodation. No scleral icterus. Extraocular muscles intact.  HEENT: Head atraumatic, normocephalic. Oropharynx and nasopharynx clear.  NECK:  Supple, no jugular venous distention. No thyroid enlargement, no tenderness.  LUNGS: Normal breath sounds bilaterally, no wheezing, rales,rhonchi or crepitation. No use of accessory muscles of respiration.  CARDIOVASCULAR: S1, S2 normal. No murmurs, rubs, or gallops.  ABDOMEN: Soft, nontender, nondistended. Bowel sounds present. No organomegaly or mass.  EXTREMITIES: No pedal edema, cyanosis, or clubbing.  NEUROLOGIC: Cranial nerves II through XII are intact. Muscle strength 5/5 in all extremities. Sensation intact. Gait not checked.  PSYCHIATRIC: The patient is alert and oriented x 3.  SKIN: No obvious rash, lesion, or ulcer.   Data Review     CBC w Diff: Lab Results  Component Value Date   WBC 7.1 01/15/2017  HGB 12.8 01/15/2017   HCT 38.5 01/15/2017   PLT 332 01/15/2017   LYMPHOPCT 24 01/15/2017   MONOPCT 6 01/15/2017   EOSPCT 2 01/15/2017   BASOPCT 0 01/15/2017   CMP: Lab Results  Component Value Date   NA 137 01/14/2017   K 3.5 01/14/2017   CL 104 01/14/2017   CO2 25 01/14/2017   BUN 10 01/14/2017   CREATININE 0.72 01/14/2017   PROT 7.4 01/13/2017   ALBUMIN 4.2 01/13/2017   BILITOT 0.2 (L) 01/13/2017   ALKPHOS 73 01/13/2017    AST 18 01/13/2017   ALT 13 (L) 01/13/2017  .  Micro Results No results found for this or any previous visit (from the past 240 hour(s)).      Code Status Orders        Start     Ordered   01/13/17 1858  Full code  Continuous     01/13/17 1857    Code Status History    Date Active Date Inactive Code Status Order ID Comments User Context   This patient has a current code status but no historical code status.          Follow-up Information    Lequita Asal, MD Follow up in 5 day(s).   Specialty:  Hematology and Oncology Why:  rectal cancer Contact information: [place] [id number] [phone]           Discharge Medications   Allergies as of 01/15/2017   No Known Allergies     Medication List    TAKE these medications   acetaminophen 325 MG tablet Commonly known as:  TYLENOL Take 2 tablets (650 mg total) by mouth every 6 (six) hours as needed for mild pain (or Fever >/= 101).   docusate sodium 100 MG capsule Commonly known as:  COLACE Take 1 capsule (100 mg total) by mouth 2 (two) times daily.   nicotine 21 mg/24hr patch Commonly known as:  NICODERM CQ - dosed in mg/24 hours Place 1 patch (21 mg total) onto the skin daily. Start taking on:  01/16/2017   oxyCODONE-acetaminophen 5-325 MG tablet Commonly known as:  ROXICET Take 1 tablet by mouth every 4 (four) hours as needed for severe pain.          Total Time in preparing paper work, data evaluation and todays exam - 35 minutes  Dustin Flock M.D on 01/15/2017 at 11:47 AM  [facility]   Office  [phone]

## 2017-01-16 ENCOUNTER — Inpatient Hospital Stay: Payer: Managed Care, Other (non HMO)

## 2017-01-16 LAB — CEA: CEA: 31.6 ng/mL — ABNORMAL HIGH (ref 0.0–4.7)

## 2017-01-16 MED ORDER — LORAZEPAM 2 MG/ML IJ SOLN
2.0000 mg | Freq: Once | INTRAMUSCULAR | Status: AC
  Administered 2017-01-16: 09:00:00 2 mg via INTRAVENOUS
  Filled 2017-01-16: qty 1

## 2017-01-16 MED ORDER — GADOBENATE DIMEGLUMINE 529 MG/ML IV SOLN
15.0000 mL | Freq: Once | INTRAVENOUS | Status: AC | PRN
  Administered 2017-01-16: 10:00:00 15 mL via INTRAVENOUS

## 2017-01-16 NOTE — Progress Notes (Signed)
Pt requesting medication to relax her prior to her MRI, MD Posey Pronto notified, placing orders.

## 2017-01-16 NOTE — Progress Notes (Signed)
DISCHARGE NOTE:  Pt given discharge note, and prescriptions (Oxycodone and Tylenol). Pt verbalized understanding. Pt wheeled to car.

## 2017-01-16 NOTE — Discharge Summary (Signed)
Tamara Hall at [place], 61 y.o., DOB 12-20-1955, MRN [id number]. Admission date: 01/13/2017 Discharge Date 01/16/2017 Primary MD No PCP Per Patient Admitting Physician Henreitta Leber, MD  Admission Diagnosis  Rectal bleeding [K62.5] Rectal cancer ([place]) [C20]  Discharge Diagnosis   Active Problems:   Rectal bleeding    Rectal cancer ([place])    Liver lesion and pulmonary lesion possibly related to metastatic disease   Nicotine abuse            Hospital Course  Tamara Hall  is a 61 y.o. female with No known past medical history who presents to the hospital due to rectal bleeding with clots. Patient says she's been having rectal bleeding on and off now for the past 9 months with some occasional mucoid stool. Patient finally decided to come to the emergency room because of increasing bleeding. She had a CT scan of the abdomen and pelvis which showed a possible rectal mass with hepatic lesion. Patient underwent a colonoscopy which showed likely malignant tumor in the rectum biopsied confirmed adenocarcinoma of the rectum. She was also seen by oncology and they recommended a pelvic MRI which showedRectal adenocarcinoma T stage:  T3 Rectal adenocarcinoma N stage:  N1 She'll need to follow-up with oncology on Monday.           Consults  oncology Significant Tests:  See full reports for all details     Ct Chest Wo Contrast  Result Date: 01/15/2017 CLINICAL DATA:  Rectal cancer with metastatic disease. EXAM: CT CHEST WITHOUT CONTRAST TECHNIQUE: Multidetector CT imaging of the chest was performed following the standard protocol without IV contrast. COMPARISON:  06/26/2009 FINDINGS: Cardiovascular: The heart size is normal. No pericardial effusion. Coronary artery calcification is noted. Atherosclerotic calcification is noted in the wall of the thoracic aorta. Mediastinum/Nodes: Calcified lymph nodes identified in the both hila and subcarinal station.  No mediastinal lymphadenopathy. No evidence for gross hilar lymphadenopathy although assessment is limited by the lack of intravenous contrast on today's study. The esophagus has normal imaging features. There is no axillary lymphadenopathy. Lungs/Pleura: Emphysema. Bronchial wall thickening. Biapical pleural-parenchymal scarring. Calcified granuloma noted right lower lobe. Tiny calcified granuloma noted left upper lobe. 9 mm noncalcified nodule left lower lobe adjacent to the heart (image 93 series 3) is new in the interval since the prior study. Upper Abdomen: Calcified granulomata noted in the liver and spleen. 16 mm left liver lesion again noted, as seen on the abdomen CT from 2 days ago. Musculoskeletal: Bone windows reveal no worrisome lytic or sclerotic osseous lesions. IMPRESSION: 1. 9 mm noncalcified left lower lobe pulmonary nodule is new since 06/26/2009. Given the history of colorectal cancer with liver metastases, metastatic disease to the left lower lobe is a concern. Close attention on follow-up recommended. 2. 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 3. Emphysema. 4. Coronary artery atherosclerosis. Electronically Signed   By: Misty Stanley M.D.   On: 01/15/2017 09:41   Mr Pelvis W Wo Contrast  Result Date: 01/16/2017 CLINICAL DATA:  Rectal mass on CT and colonoscopy.  Rectal bleeding. EXAM: MRI PELVIS WITHOUT AND WITH CONTRAST TECHNIQUE: Multiplanar multisequence MR imaging of the pelvis was performed both before and after administration of intravenous contrast. Small amount of [place] gel was administered per rectum to optimize tumor evaluation. CONTRAST:  70m MULTIHANCE GADOBENATE DIMEGLUMINE 529 MG/ML IV SOLN COMPARISON:  Report of colonoscopy of 01/14/2017. abdominopelvic CT of 01/13/2017. FINDINGS: TUMOR LOCATION Location from Anal Verge:  6.8  cm, including on image 22/series 2. Shortest Distance from Tumor to Anal Sphincter: Not well evaluated secondary to technique. Favored to be on the order of 8  mm, including image 18/series 4. TUMOR DESCRIPTION Circumferential Extent: Covers an approximately 180 degree circumference, centered about the 4 o'clock position. Example image 22/series 3 and image 28/series 7. Also image 20/series 8. Craniocaudal Extent:  4.6 cm on image 22/series 2 T - CATEGORY Extension through [employer]: Present, including at bone image 19/series 3. T3 Maximum extension beyond Mus[employer]:  17 mm (early T3 = <50m; advanced T3 = >562m Extramural vascular invasion/tumor thrombus:  No Shortest distance of any tumor/node from Mesorectal Fascia: 10 mm, image 22/series 3 Invasion of Anterior Peritoneal Reflection:  No Involvement of Adjacent Organs or Pelvic Sidewall Structures:  No N - CATEGORY Mesorectal Lymph Nodes >=5m68mYes = N1; adenopathy in the left side of the mesorectum, including at 9 mm on image 21/series 3 Extra-mesorectal Lymphadenopathy:  No Other: No significant free fluid. No evidence of omental or peritoneal disease. Normal urinary bladder. Hysterectomy. No adnexal mass. No acute osseous abnormality. Mild pelvic floor laxity. IMPRESSION: Rectal adenocarcinoma T stage:  T3 Rectal adenocarcinoma N stage:  N1 Distance from tumor to the anal sphincter is approximately 8 mm . Electronically Signed   By: KylAbigail MiyamotoD.   On: 01/16/2017 10:50   Ct Abdomen Pelvis W Contrast  Result Date: 01/13/2017 CLINICAL DATA:  Rectal bleeding and abdominal pain for 9 months. Bleeding is markedly worsened over the past 3 days. EXAM: CT ABDOMEN AND PELVIS WITH CONTRAST TECHNIQUE: Multidetector CT imaging of the abdomen and pelvis was performed using the standard protocol following bolus administration of intravenous contrast. CONTRAST:  100 ml ISOVUE-300 IOPAMIDOL (ISOVUE-300) INJECTION 61% COMPARISON:  None. FINDINGS: Lower chest: No pleural or pericardial effusion. Punctate calcified granulomata are seen in the lower lobes bilaterally. Hepatobiliary: A 1.3 by 1.6 cm  hypoattenuating lesion is seen on image 13 in the right hepatic lobe. A second punctate hypoattenuating lesion is identified on image 19. Multiple small calcifications are seen throughout the liver. The gallbladder and biliary tree appear normal. Pancreas: Unremarkable. No pancreatic ductal dilatation or surrounding inflammatory changes. Spleen: No acute or focal abnormality. Normal in size. Multiple calcifications in the spleen are consistent with old granulomatous disease. Adrenals/Urinary Tract: Adrenal glands are unremarkable. Kidneys are normal, without renal calculi, focal lesion, or hydronephrosis. Bladder is unremarkable. Stomach/Bowel: Irregular wall thickening in the rectum is suspicious for rectal mass. The colon otherwise appears normal. No evidence of appendicitis is seen. The stomach and small bowel appear normal. Vascular/Lymphatic: There is extensive aortoiliac atherosclerosis. Infrarenal aortic aneurysm measures 3.2 x 3.0 cm. Small pelvic sidewall lymph nodes are seen bilaterally. No pathologic lymphadenopathy by CT size criteria is identified. Reproductive:  Status post hysterectomy.  No adnexal masses. Other: No fluid collection. Musculoskeletal: No lytic or sclerotic lesion. IMPRESSION: Irregular wall thickening of the rectum is suspicious for carcinoma in this patient with rectal bleeding. 1.6 cm lesion in the right hepatic lobe is worrisome for metastatic disease. The appearance this lesion is not typical of a cyst or hemangioma. 3.2 cm infrarenal abdominal aortic aneurysm. Recommend followup by ultrasound in 3 years. This recommendation follows ACR consensus guidelines: White Paper of the ACR Incidental Findings Committee II on Vascular Findings. J AJoellyn Rueddiol 2013; [phone] granulomatous disease. Electronically Signed   By: ThoInge RiseD.   On: 01/13/2017 16:06       Today  Subjective:   Tamara Hall  abdominal pain much improved  Objective:   Blood pressure  131/62, pulse 66, temperature 98.7 F (37.1 C), temperature source Oral, resp. rate 18, height '5\' 7"'$  (1.702 m), weight 160 lb (72.6 kg), SpO2 97 %.  .  Intake/Output Summary (Last 24 hours) at 01/16/17 1146 Last data filed at 01/15/17 1800  Gross per 24 hour  Intake              240 ml  Output                0 ml  Net              240 ml    Exam VITAL SIGNS: Blood pressure 131/62, pulse 66, temperature 98.7 F (37.1 C), temperature source Oral, resp. rate 18, height '5\' 7"'$  (1.702 m), weight 160 lb (72.6 kg), SpO2 97 %.  GENERAL:  61 y.o.-year-old patient lying in the bed with no acute distress.  EYES: Pupils equal, round, reactive to light and accommodation. No scleral icterus. Extraocular muscles intact.  HEENT: Head atraumatic, normocephalic. Oropharynx and nasopharynx clear.  NECK:  Supple, no jugular venous distention. No thyroid enlargement, no tenderness.  LUNGS: Normal breath sounds bilaterally, no wheezing, rales,rhonchi or crepitation. No use of accessory muscles of respiration.  CARDIOVASCULAR: S1, S2 normal. No murmurs, rubs, or gallops.  ABDOMEN: Soft, nontender, nondistended. Bowel sounds present. No organomegaly or mass.  EXTREMITIES: No pedal edema, cyanosis, or clubbing.  NEUROLOGIC: Cranial nerves II through XII are intact. Muscle strength 5/5 in all extremities. Sensation intact. Gait not checked.  PSYCHIATRIC: The patient is alert and oriented x 3.  SKIN: No obvious rash, lesion, or ulcer.   Data Review     CBC w Diff:  Lab Results  Component Value Date   WBC 7.1 01/15/2017   HGB 12.8 01/15/2017   HCT 38.5 01/15/2017   PLT 332 01/15/2017   LYMPHOPCT 24 01/15/2017   MONOPCT 6 01/15/2017   EOSPCT 2 01/15/2017   BASOPCT 0 01/15/2017   CMP:  Lab Results  Component Value Date   NA 137 01/14/2017   K 3.5 01/14/2017   CL 104 01/14/2017   CO2 25 01/14/2017   BUN 10 01/14/2017   CREATININE 0.72 01/14/2017   PROT 7.4 01/13/2017   ALBUMIN 4.2 01/13/2017    BILITOT 0.2 (L) 01/13/2017   ALKPHOS 73 01/13/2017   AST 18 01/13/2017   ALT 13 (L) 01/13/2017  .  Micro Results No results found for this or any previous visit (from the past 240 hour(s)).      Code Status Orders        Start     Ordered   01/13/17 1858  Full code  Continuous     01/13/17 1857    Code Status History    Date Active Date Inactive Code Status Order ID Comments User Context   This patient has a current code status but no historical code status.          Follow-up Information    Lequita Asal, MD Follow up on 01/20/2017.   Specialty:  Hematology and Oncology Why:  @ 2:15 p.m. for rectal cancer  Contact information: [place]  [address] [phone]           Discharge Medications   Allergies as of 01/16/2017   No Known Allergies     Medication List    TAKE these medications   acetaminophen 325 MG  tablet Commonly known as:  TYLENOL Take 2 tablets (650 mg total) by mouth every 6 (six) hours as needed for mild pain (or Fever >/= 101).   docusate sodium 100 MG capsule Commonly known as:  COLACE Take 1 capsule (100 mg total) by mouth 2 (two) times daily.   nicotine 21 mg/24hr patch Commonly known as:  NICODERM CQ - dosed in mg/24 hours Place 1 patch (21 mg total) onto the skin daily.   oxyCODONE-acetaminophen 5-325 MG tablet Commonly known as:  ROXICET Take 1 tablet by mouth every 4 (four) hours as needed for severe pain.          Total Time in preparing paper work, data evaluation and todays exam - 35 minutes  Dustin Flock M.D on 01/16/2017 at 11:46 AM  [facility]   Office  [phone]

## 2017-01-16 NOTE — Progress Notes (Signed)
[place] [facility]                                                                                                                                                                                  Patient Demographics   Tamara Hall, is a 61 y.o. female, DOB - September 06, 1956, NAT:[id number]  Admit date - 01/13/2017   Admitting Physician Henreitta Leber, MD  Outpatient Primary MD for the patient is No PCP Per Patient   LOS - 3  Subjective: Started complaining of severe pain in the abdomen and so discharge was 2    Review of Systems:   CONSTITUTIONAL: No documented fever. No fatigue, weakness. No weight gain, no weight loss.  EYES: No blurry or double vision.  ENT: No tinnitus. No postnasal drip. No redness of the oropharynx.  RESPIRATORY: No cough, no wheeze, no hemoptysis. No dyspnea.  CARDIOVASCULAR: No chest pain. No orthopnea. No palpitations. No syncope.  GASTROINTESTINAL: No nausea, no vomiting or diarrhea. No abdominal pain. Bright red blood per rectum GENITOURINARY: No dysuria or hematuria.  ENDOCRINE: No polyuria or nocturia. No heat or cold intolerance.  HEMATOLOGY: No anemia. No bruising. No bleeding.  INTEGUMENTARY: No rashes. No lesions.  MUSCULOSKELETAL: No arthritis. No swelling. No gout.  NEUROLOGIC: No numbness, tingling, or ataxia. No seizure-type activity.  PSYCHIATRIC: No anxiety. No insomnia. No ADD.    Vitals:   Vitals:   01/15/17 1543 01/15/17 2056 01/16/17 0414 01/16/17 0810  BP: (!) 124/54 (!) 119/49 113/60 131/62  Pulse: 73 68 72 66  Resp:  '20 20 18  '$ Temp: 98.1 F (36.7 C) 98.6 F (37 C) 97.8 F (36.6 C) 98.7 F (37.1 C)  TempSrc: Oral  Oral Oral  SpO2: 99% 96% 100% 97%  Weight:      Height:        Wt Readings from Last 3 Encounters:  01/13/17 160 lb (72.6 kg)     Intake/Output Summary (Last 24 hours) at 01/16/17 1149 Last data filed at 01/15/17 1800  Gross per 24 hour  Intake              240 ml  Output                 0 ml  Net              240 ml    Physical Exam:   GENERAL: Pleasant-appearing in no apparent distress.  HEAD, EYES, EARS, NOSE AND THROAT: Atraumatic, normocephalic. Extraocular muscles are intact. Pupils equal and reactive to light. Sclerae anicteric. No conjunctival injection. No oro-pharyngeal erythema.  NECK: Supple. There is no jugular venous distention. No bruits,  no lymphadenopathy, no thyromegaly.  HEART: Regular rate and rhythm,. No murmurs, no rubs, no clicks.  LUNGS: Clear to auscultation bilaterally. No rales or rhonchi. No wheezes.  ABDOMEN: Soft, flat, nontender, nondistended. Has good bowel sounds. No hepatosplenomegaly appreciated.  EXTREMITIES: No evidence of any cyanosis, clubbing, or peripheral edema.  +2 pedal and radial pulses bilaterally.  NEUROLOGIC: The patient is alert, awake, and oriented x3 with no focal motor or sensory deficits appreciated bilaterally.  SKIN: Moist and warm with no rashes appreciated.  Psych: Not anxious, depressed LN: No inguinal LN enlargement    Antibiotics   Anti-infectives    None      Medications   Scheduled Meds: . nicotine  21 mg Transdermal Daily  . pneumococcal 23 valent vaccine  0.5 mL Intramuscular Tomorrow-1000   Continuous Infusions: PRN Meds:.acetaminophen **OR** acetaminophen, hydrALAZINE, HYDROmorphone (DILAUDID) injection, ondansetron **OR** ondansetron (ZOFRAN) IV   Data Review:   Micro Results No results found for this or any previous visit (from the past 240 hour(s)).  Radiology Reports Ct Chest Wo Contrast  Result Date: 01/15/2017 CLINICAL DATA:  Rectal cancer with metastatic disease. EXAM: CT CHEST WITHOUT CONTRAST TECHNIQUE: Multidetector CT imaging of the chest was performed following the standard protocol without IV contrast. COMPARISON:  06/26/2009 FINDINGS: Cardiovascular: The heart size is normal. No pericardial effusion. Coronary artery calcification is noted. Atherosclerotic  calcification is noted in the wall of the thoracic aorta. Mediastinum/Nodes: Calcified lymph nodes identified in the both hila and subcarinal station. No mediastinal lymphadenopathy. No evidence for gross hilar lymphadenopathy although assessment is limited by the lack of intravenous contrast on today's study. The esophagus has normal imaging features. There is no axillary lymphadenopathy. Lungs/Pleura: Emphysema. Bronchial wall thickening. Biapical pleural-parenchymal scarring. Calcified granuloma noted right lower lobe. Tiny calcified granuloma noted left upper lobe. 9 mm noncalcified nodule left lower lobe adjacent to the heart (image 93 series 3) is new in the interval since the prior study. Upper Abdomen: Calcified granulomata noted in the liver and spleen. 16 mm left liver lesion again noted, as seen on the abdomen CT from 2 days ago. Musculoskeletal: Bone windows reveal no worrisome lytic or sclerotic osseous lesions. IMPRESSION: 1. 9 mm noncalcified left lower lobe pulmonary nodule is new since 06/26/2009. Given the history of colorectal cancer with liver metastases, metastatic disease to the left lower lobe is a concern. Close attention on follow-up recommended. 2. 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 3. Emphysema. 4. Coronary artery atherosclerosis. Electronically Signed   By: Misty Stanley M.D.   On: 01/15/2017 09:41   Mr Pelvis W Wo Contrast  Result Date: 01/16/2017 CLINICAL DATA:  Rectal mass on CT and colonoscopy.  Rectal bleeding. EXAM: MRI PELVIS WITHOUT AND WITH CONTRAST TECHNIQUE: Multiplanar multisequence MR imaging of the pelvis was performed both before and after administration of intravenous contrast. Small amount of [place] gel was administered per rectum to optimize tumor evaluation. CONTRAST:  62m MULTIHANCE GADOBENATE DIMEGLUMINE 529 MG/ML IV SOLN COMPARISON:  Report of colonoscopy of 01/14/2017. abdominopelvic CT of 01/13/2017. FINDINGS: TUMOR LOCATION Location from Anal Verge:  6.8 cm,  including on image 22/series 2. Shortest Distance from Tumor to Anal Sphincter: Not well evaluated secondary to technique. Favored to be on the order of 8 mm, including image 18/series 4. TUMOR DESCRIPTION Circumferential Extent: Covers an approximately 180 degree circumference, centered about the 4 o'clock position. Example image 22/series 3 and image 28/series 7. Also image 20/series 8. Craniocaudal Extent:  4.6 cm on image 22/series 2 T -  CATEGORY Extension through Muscularis Propria: Present, including at bone image 19/series 3. T3 Maximum extension beyond Muscularis Propria:  17 mm (early T3 = <82m; advanced T3 = >550m Extramural vascular invasion/tumor thrombus:  No Shortest distance of any tumor/node from Mesorectal Fascia: 10 mm, image 22/series 3 Invasion of Anterior Peritoneal Reflection:  No Involvement of Adjacent Organs or Pelvic Sidewall Structures:  No N - CATEGORY Mesorectal Lymph Nodes >=40m30mYes = N1; adenopathy in the left side of the mesorectum, including at 9 mm on image 21/series 3 Extra-mesorectal Lymphadenopathy:  No Other: No significant free fluid. No evidence of omental or peritoneal disease. Normal urinary bladder. Hysterectomy. No adnexal mass. No acute osseous abnormality. Mild pelvic floor laxity. IMPRESSION: Rectal adenocarcinoma T stage:  T3 Rectal adenocarcinoma N stage:  N1 Distance from tumor to the anal sphincter is approximately 8 mm . Electronically Signed   By: KylAbigail MiyamotoD.   On: 01/16/2017 10:50   Ct Abdomen Pelvis W Contrast  Result Date: 01/13/2017 CLINICAL DATA:  Rectal bleeding and abdominal pain for 9 months. Bleeding is markedly worsened over the past 3 days. EXAM: CT ABDOMEN AND PELVIS WITH CONTRAST TECHNIQUE: Multidetector CT imaging of the abdomen and pelvis was performed using the standard protocol following bolus administration of intravenous contrast. CONTRAST:  100 ml ISOVUE-300 IOPAMIDOL (ISOVUE-300) INJECTION 61% COMPARISON:  None. FINDINGS: Lower  chest: No pleural or pericardial effusion. Punctate calcified granulomata are seen in the lower lobes bilaterally. Hepatobiliary: A 1.3 by 1.6 cm hypoattenuating lesion is seen on image 13 in the right hepatic lobe. A second punctate hypoattenuating lesion is identified on image 19. Multiple small calcifications are seen throughout the liver. The gallbladder and biliary tree appear normal. Pancreas: Unremarkable. No pancreatic ductal dilatation or surrounding inflammatory changes. Spleen: No acute or focal abnormality. Normal in size. Multiple calcifications in the spleen are consistent with old granulomatous disease. Adrenals/Urinary Tract: Adrenal glands are unremarkable. Kidneys are normal, without renal calculi, focal lesion, or hydronephrosis. Bladder is unremarkable. Stomach/Bowel: Irregular wall thickening in the rectum is suspicious for rectal mass. The colon otherwise appears normal. No evidence of appendicitis is seen. The stomach and small bowel appear normal. Vascular/Lymphatic: There is extensive aortoiliac atherosclerosis. Infrarenal aortic aneurysm measures 3.2 x 3.0 cm. Small pelvic sidewall lymph nodes are seen bilaterally. No pathologic lymphadenopathy by CT size criteria is identified. Reproductive:  Status post hysterectomy.  No adnexal masses. Other: No fluid collection. Musculoskeletal: No lytic or sclerotic lesion. IMPRESSION: Irregular wall thickening of the rectum is suspicious for carcinoma in this patient with rectal bleeding. 1.6 cm lesion in the right hepatic lobe is worrisome for metastatic disease. The appearance this lesion is not typical of a cyst or hemangioma. 3.2 cm infrarenal abdominal aortic aneurysm. Recommend followup by ultrasound in 3 years. This recommendation follows ACR consensus guidelines: White Paper of the ACR Incidental Findings Committee II on Vascular Findings. J AJoellyn Rueddiol 2013; [id number] granulomatous disease. Electronically Signed   By: ThoInge Rise.D.   On: 01/13/2017 16:06     CBC  Recent Labs Lab 01/13/17 1409 01/13/17 1916 01/14/17 0054 01/14/17 0650 01/15/17 0534  WBC 11.1*  --  8.4  --  7.1  HGB 12.9 13.2 12.2 12.4 12.8  HCT 38.5  --  35.7  --  38.5  PLT 326  --  309  --  332  MCV 91.4  --  89.3  --  90.9  MCH 30.6  --  30.6  --  30.3  MCHC 33.5  --  34.2  --  33.4  RDW 13.4  --  13.3  --  13.4  LYMPHSABS  --   --   --   --  1.7  MONOABS  --   --   --   --  0.4  EOSABS  --   --   --   --  0.1  BASOSABS  --   --   --   --  0.0    Chemistries   Recent Labs Lab 01/13/17 1409 01/14/17 0054  NA 138 137  K 3.9 3.5  CL 104 104  CO2 30 25  GLUCOSE 96 110*  BUN 12 10  CREATININE 0.72 0.72  CALCIUM 9.1 8.6*  AST 18  --   ALT 13*  --   ALKPHOS 73  --   BILITOT 0.2*  --    ------------------------------------------------------------------------------------------------------------------ estimated creatinine clearance is 71.8 mL/min (by C-G formula based on SCr of 0.72 mg/dL). ------------------------------------------------------------------------------------------------------------------ No results for input(s): HGBA1C in the last 72 hours. ------------------------------------------------------------------------------------------------------------------ No results for input(s): CHOL, HDL, LDLCALC, TRIG, CHOLHDL, LDLDIRECT in the last 72 hours. ------------------------------------------------------------------------------------------------------------------ No results for input(s): TSH, T4TOTAL, T3FREE, THYROIDAB in the last 72 hours.  Invalid input(s): FREET3 ------------------------------------------------------------------------------------------------------------------  Recent Labs  01/15/17 0534  FERRITIN 30  TIBC 320  IRON 37    Coagulation profile No results for input(s): INR, PROTIME in the last 168 hours.  No results for input(s): DDIMER in the last 72 hours.  Cardiac Enzymes No results  for input(s): CKMB, TROPONINI, MYOGLOBIN in the last 168 hours.  Invalid input(s): CK ------------------------------------------------------------------------------------------------------------------ Invalid input(s): POCBNP    Assessment & Plan   61 year old female with no significant past medical history presents to the hospital but rectal bleeding.  1. Rectal bleeding sigmoidoscopy shows malignant tumor in rectum.    2. Rectal thickening with a hepatic lesion-suspicious for underlying rectal cancer which is metastatic. D/w dr. Burt Knack he states pt will need colorectal surgeon to evaluate the patient. We'll control her pain with biopsy results try to get a MRI of pelvis as per oncology request  3. Nicotine abuse: smoking cessation provided 3 min spent start patient on nicotine replacment     Code Status Orders        Start     Ordered   01/13/17 1858  Full code  Continuous     01/13/17 1857    Code Status History    Date Active Date Inactive Code Status Order ID Comments User Context   This patient has a current code status but no historical code status.           Consults  gi DVT Prophylaxis scd's  Lab Results  Component Value Date   PLT 332 01/15/2017     Time Spent in minutes   36mn  Greater than 50% of time spent in care coordination and counseling patient regarding the condition and plan of care.   PDustin FlockM.D on 01/16/2017 at 11:49 AM  Between 7am to 6pm - Pager - [phone]  After 6pm go to [email or web address] - password [email or web address]   Office  [phone]

## 2017-01-17 ENCOUNTER — Other Ambulatory Visit: Payer: Self-pay | Admitting: Hematology and Oncology

## 2017-01-17 DIAGNOSIS — C20 Malignant neoplasm of rectum: Secondary | ICD-10-CM

## 2017-01-20 ENCOUNTER — Inpatient Hospital Stay: Payer: Managed Care, Other (non HMO) | Attending: Hematology and Oncology | Admitting: Hematology and Oncology

## 2017-01-20 ENCOUNTER — Encounter: Payer: Self-pay | Admitting: Hematology and Oncology

## 2017-01-20 VITALS — BP 157/77 | HR 73 | Temp 96.5°F | Resp 18 | Ht 67.0 in | Wt 181.5 lb

## 2017-01-20 DIAGNOSIS — R918 Other nonspecific abnormal finding of lung field: Secondary | ICD-10-CM | POA: Diagnosis present

## 2017-01-20 DIAGNOSIS — R1909 Other intra-abdominal and pelvic swelling, mass and lump: Secondary | ICD-10-CM | POA: Diagnosis not present

## 2017-01-20 DIAGNOSIS — K5900 Constipation, unspecified: Secondary | ICD-10-CM | POA: Diagnosis not present

## 2017-01-20 DIAGNOSIS — K6289 Other specified diseases of anus and rectum: Secondary | ICD-10-CM

## 2017-01-20 DIAGNOSIS — K769 Liver disease, unspecified: Secondary | ICD-10-CM | POA: Insufficient documentation

## 2017-01-20 DIAGNOSIS — I714 Abdominal aortic aneurysm, without rupture: Secondary | ICD-10-CM | POA: Diagnosis not present

## 2017-01-20 DIAGNOSIS — I10 Essential (primary) hypertension: Secondary | ICD-10-CM | POA: Insufficient documentation

## 2017-01-20 DIAGNOSIS — R970 Elevated carcinoembryonic antigen [CEA]: Secondary | ICD-10-CM | POA: Insufficient documentation

## 2017-01-20 DIAGNOSIS — C20 Malignant neoplasm of rectum: Secondary | ICD-10-CM | POA: Diagnosis not present

## 2017-01-20 DIAGNOSIS — Z801 Family history of malignant neoplasm of trachea, bronchus and lung: Secondary | ICD-10-CM | POA: Diagnosis not present

## 2017-01-20 DIAGNOSIS — Z808 Family history of malignant neoplasm of other organs or systems: Secondary | ICD-10-CM | POA: Diagnosis not present

## 2017-01-20 DIAGNOSIS — F17210 Nicotine dependence, cigarettes, uncomplicated: Secondary | ICD-10-CM | POA: Insufficient documentation

## 2017-01-20 DIAGNOSIS — Z79899 Other long term (current) drug therapy: Secondary | ICD-10-CM | POA: Insufficient documentation

## 2017-01-20 MED ORDER — HYDROCODONE-ACETAMINOPHEN 5-325 MG PO TABS: 1.0000 | ORAL_TABLET | Freq: Four times a day (QID) | ORAL | 0 refills | Status: DC | PRN

## 2017-01-20 NOTE — Progress Notes (Signed)
Patient seen in hospital by Dr. Mike Gip.  Patient states she has had only one BM in past 11 days.  Colace has not helped.  She is afraid to eat because she is not passing her bowels.  States "she feels like she is going to explode".  Patient continues to smoke.  Wants to try to stop on her own but right now is too stressed.

## 2017-01-20 NOTE — Progress Notes (Signed)
[facility] day:  01/20/2017  Chief Complaint: Tamara Hall is a 61 y.o. female with rectal cancer who is seen for assessment after interval hospitalization.  HPI:  She was admitted to [facility] from 01/13/2017 - 01/16/2017 with rectal bleeding for 9 months.  She has struggled with constipation.  She presented with a 3 day history of significant rectal bleeding (passing large clots) and some lower abdominal discomfort.  Abdomen and pelvic CT scan on 01/13/2017 revealed irregular thickening of the rectum suspicious for carcinoma.  There was a 1.6 cm hypoattenuating lesion in the right hepatic lobe worrisome for metastatic disease.  There was an incidental 3.2 cm infrarenal abdominal aortic aneurysm.  Chest CT without contrast on 01/15/2017 revealed a 9 mm noncalcified left lower lobe pulmonary nodule (new since 06/26/2009).  Pelvic MRI on 01/16/2017 revealed rectal adenocarcinoma T3N1.  There was extension beyond the muscularis propria (17 mm).  There were mesorectal nodes >= 5 mm.  There was adenopathy on the left side of the mesorectum, including a 9 mm lesion.  The distance from the tumor to the anal sphincter was 8 mm.  Labs included a normal CBC and CMP.  CEA was 31.6.  She underwent colonoscopy by Dr. Allen Norris on 01/13/2017.  Digital rectal exam revealed no mass.  There was a frond-like/villous non-obstructing large mass in the rectum. The mass was non-circumferential.  Biopsies revealed high grade dysplasia within an adenoma.  Symptomatically, she notes constipation.  She denies any nausea, vomiting or diarrhea.   She denies any family history of colon cancer.  Her parents had lung cancer.  Her maternal grandmother had a brain tumor.   Past Medical History:  Diagnosis Date  . Hypertension     Past Surgical History:  Procedure Laterality Date  . ABDOMINAL HYSTERECTOMY    . BREAST SURGERY    . COLONOSCOPY WITH PROPOFOL N/A 01/14/2017    Procedure: COLONOSCOPY WITH PROPOFOL;  Surgeon: Lucilla Lame, MD;  Location: [facility] ENDOSCOPY;  Service: Endoscopy;  Laterality: N/A;  . ELBOW SURGERY Right    Has had 4 surgeries on right elbow.  Marland Kitchen HAND SURGERY Right 2008  . JOINT REPLACEMENT    . TONSILLECTOMY      Family History  Problem Relation Age of Onset  . Lung cancer Mother   . Lung cancer Father   . Brain cancer Maternal Grandmother     Social History:  reports that she has been smoking Cigarettes.  She has a 80.00 pack-year smoking history. She has never used smokeless tobacco. She reports that she does not drink alcohol or use drugs.  She lives in [place].  She has 3 children (ages 79, 65, and 33).  She smokes 2 packs a day.  She started smoking at age 60.  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  She is alone today.  Allergies: No Known Allergies  Current Medications: Current Outpatient Prescriptions  Medication Sig Dispense Refill  . acetaminophen (TYLENOL) 325 MG tablet Take 2 tablets (650 mg total) by mouth every 6 (six) hours as needed for mild pain (or Fever >/= 101).    Marland Kitchen docusate sodium (COLACE) 100 MG capsule Take 1 capsule (100 mg total) by mouth 2 (two) times daily. 10 capsule 0  . oxyCODONE-acetaminophen (ROXICET) 5-325 MG tablet Take 1 tablet by mouth every 4 (four) hours as needed for severe pain. 30 tablet 0  .  HYDROcodone-acetaminophen (NORCO/VICODIN) 5-325 MG tablet Take 1 tablet by mouth every 6 (six) hours as needed for moderate pain. 30 tablet 0  . nicotine (NICODERM CQ - DOSED IN MG/24 HOURS) 21 mg/24hr patch Place 1 patch (21 mg total) onto the skin daily. (Patient not taking: Reported on 01/20/2017) 28 patch 0   No current facility-administered medications for this visit.     Review of Systems:  GENERAL:  Fatigue x 2 months.  No fevers, sweats or weight loss. PERFORMANCE STATUS (ECOG):  0 HEENT:  No visual changes, runny nose, sore throat, mouth  sores or tenderness. Lungs: No shortness of breath or cough.  No hemoptysis. Cardiac:  No chest pain, palpitations, orthopnea, or PND. GI:  Bloating.  Constipation.  Rectal bleeding.  No nausea, vomiting, diarrhea, or melena. GU:  No urgency, frequency, dysuria, or hematuria. Musculoskeletal:  No back pain.  No joint pain.  No muscle tenderness. Extremities:  No pain or swelling. Skin:  No rashes or skin changes. Neuro:  No headache, numbness or weakness, balance or coordination issues. Endocrine:  No diabetes, thyroid issues, hot flashes or night sweats. Psych:  Stress/anxiety.  No depression. Pain:  No focal pain. Review of systems:  All other systems reviewed and found to be negative.  Physical Exam: Blood pressure (!) 157/77, pulse 73, temperature (!) 96.5 F (35.8 C), temperature source Tympanic, resp. rate 18, height _0  (1.702 m), weight 181 lb 8 oz (82.3 kg). GENERAL:  Well developed, well nourished, woman sitting comfortably in the oncology clinic in no acute distress. MENTAL STATUS:  Alert and oriented to person, place and time. HEAD:  Long gray hair in a pony tail.  Normocephalic, atraumatic, face symmetric, no Cushingoid features. EYES:  Blue eyes.  Pupils equal round and reactive to light and accomodation.  No conjunctivitis or scleral icterus. ENT:  Oropharynx clear without lesion.  Tongue normal. Mucous membranes moist.  RESPIRATORY:  Clear to auscultation without rales, wheezes or rhonchi. CARDIOVASCULAR:  Regular rate and rhythm without murmur, rub or gallop. ABDOMEN:  Soft, slightly tender in lower quadrants.  No guarding or rebound tenderness.  Active bowel sounds and no hepatosplenomegaly.  No masses. SKIN:  No rashes, ulcers or lesions. EXTREMITIES: No edema, no skin discoloration or tenderness.  No palpable cords. LYMPH NODES: No palpable cervical, supraclavicular, axillary or inguinal adenopathy  NEUROLOGICAL: Unremarkable. PSYCH:  Appropriate.   No visits  with results within 3 Day(s) from this visit.  Latest known visit with results is:  Admission on 01/13/2017, Discharged on 01/16/2017  Component Date Value Ref Range Status  . Sodium 01/13/2017 138  135 - 145 mmol/L Final  . Potassium 01/13/2017 3.9  3.5 - 5.1 mmol/L Final  . Chloride 01/13/2017 104  101 - 111 mmol/L Final  . CO2 01/13/2017 30  22 - 32 mmol/L Final  . Glucose, Bld 01/13/2017 96  65 - 99 mg/dL Final  . BUN 01/13/2017 12  6 - 20 mg/dL Final  . Creatinine, Ser 01/13/2017 0.72  0.44 - 1.00 mg/dL Final  . Calcium 01/13/2017 9.1  8.9 - 10.3 mg/dL Final  . Total Protein 01/13/2017 7.4  6.5 - 8.1 g/dL Final  . Albumin 01/13/2017 4.2  3.5 - 5.0 g/dL Final  . AST 01/13/2017 18  15 - 41 U/L Final  . ALT 01/13/2017 13* 14 - 54 U/L Final  . Alkaline Phosphatase 01/13/2017 73  38 - 126 U/L Final  . Total Bilirubin 01/13/2017 0.2* 0.3 - 1.2 mg/dL Final  .  GFR calc non Af Amer 01/13/2017 >60  >60 mL/min Final  . GFR calc Af Amer 01/13/2017 >60  >60 mL/min Final   Comment: (NOTE) The eGFR has been calculated using the CKD EPI equation. This calculation has not been validated in all clinical situations. eGFR's persistently <60 mL/min signify possible Chronic Kidney Disease.   . Anion gap 01/13/2017 4* 5 - 15 Final  . WBC 01/13/2017 11.1* 3.6 - 11.0 K/uL Final  . RBC 01/13/2017 4.21  3.80 - 5.20 MIL/uL Final  . Hemoglobin 01/13/2017 12.9  12.0 - 16.0 g/dL Final  . HCT 01/13/2017 38.5  35.0 - 47.0 % Final  . MCV 01/13/2017 91.4  80.0 - 100.0 fL Final  . MCH 01/13/2017 30.6  26.0 - 34.0 pg Final  . MCHC 01/13/2017 33.5  32.0 - 36.0 g/dL Final  . RDW 01/13/2017 13.4  11.5 - 14.5 % Final  . Platelets 01/13/2017 326  150 - 440 K/uL Final  . Color, Urine 01/13/2017 STRAW* YELLOW Final  . APPearance 01/13/2017 HAZY* CLEAR Final  . Specific Gravity, Urine 01/13/2017 1.009  1.005 - 1.030 Final  . pH 01/13/2017 6.0  5.0 - 8.0 Final  . Glucose, UA 01/13/2017 NEGATIVE  NEGATIVE mg/dL Final   . Hgb urine dipstick 01/13/2017 NEGATIVE  NEGATIVE Final  . Bilirubin Urine 01/13/2017 NEGATIVE  NEGATIVE Final  . Ketones, ur 01/13/2017 NEGATIVE  NEGATIVE mg/dL Final  . Protein, ur 01/13/2017 NEGATIVE  NEGATIVE mg/dL Final  . Nitrite 01/13/2017 NEGATIVE  NEGATIVE Final  . Leukocytes, UA 01/13/2017 TRACE* NEGATIVE Final  . RBC / HPF 01/13/2017 0-5  0 - 5 RBC/hpf Final  . WBC, UA 01/13/2017 0-5  0 - 5 WBC/hpf Final  . Bacteria, UA 01/13/2017 NONE SEEN  NONE SEEN Final  . Squamous Epithelial / LPF 01/13/2017 0-5* NONE SEEN Final  . Mucous 01/13/2017 PRESENT   Final  . HIV Screen 4th Generation wRfx 01/13/2017 Non Reactive  Non Reactive Final   Comment: (NOTE) Performed At: [facility] [place], [place] [id number] Lindon Romp MD [id number]   . Sodium 01/14/2017 137  135 - 145 mmol/L Final  . Potassium 01/14/2017 3.5  3.5 - 5.1 mmol/L Final  . Chloride 01/14/2017 104  101 - 111 mmol/L Final  . CO2 01/14/2017 25  22 - 32 mmol/L Final  . Glucose, Bld 01/14/2017 110* 65 - 99 mg/dL Final  . BUN 01/14/2017 10  6 - 20 mg/dL Final  . Creatinine, Ser 01/14/2017 0.72  0.44 - 1.00 mg/dL Final  . Calcium 01/14/2017 8.6* 8.9 - 10.3 mg/dL Final  . GFR calc non Af Amer 01/14/2017 >60  >60 mL/min Final  . GFR calc Af Amer 01/14/2017 >60  >60 mL/min Final   Comment: (NOTE) The eGFR has been calculated using the CKD EPI equation. This calculation has not been validated in all clinical situations. eGFR's persistently <60 mL/min signify possible Chronic Kidney Disease.   . Anion gap 01/14/2017 8  5 - 15 Final  . WBC 01/14/2017 8.4  3.6 - 11.0 K/uL Final  . RBC 01/14/2017 4.00  3.80 - 5.20 MIL/uL Final  . Hemoglobin 01/14/2017 12.2  12.0 - 16.0 g/dL Final  . HCT 01/14/2017 35.7  35.0 - 47.0 % Final  . MCV 01/14/2017 89.3  80.0 - 100.0 fL Final  . MCH 01/14/2017 30.6  26.0 - 34.0 pg Final  . MCHC 01/14/2017 34.2  32.0 - 36.0 g/dL Final  . RDW 01/14/2017 13.3  11.5 -  14.5 % Final  . Platelets 01/14/2017 309  150 - 440 K/uL Final  . Hemoglobin 01/13/2017 13.2  12.0 - 16.0 g/dL Final  . Hemoglobin 01/14/2017 12.4  12.0 - 16.0 g/dL Final  . SURGICAL PATHOLOGY 01/14/2017    Final                   Value:Surgical Pathology CASE: [id number] PATIENT: Adam Whitford Surgical Pathology Report     SPECIMEN SUBMITTED: A. Rectum mass; cbx  CLINICAL HISTORY: None provided  PRE-OPERATIVE DIAGNOSIS: Hematochezia  POST-OPERATIVE DIAGNOSIS: Rectal mass, diverticulosis     DIAGNOSIS: A. RECTUM MASS; COLD BIOPSY: - HIGH-GRADE DYSPLASIA WITHIN AN ADENOMA, SEE COMMENT.  Comment: Additional deeper sections were reviewed. High-grade dysplasia is present, at least; adenocarcinoma cannot be excluded on the basis of this sample.   GROSS DESCRIPTION: A. Labeled: rectal mass C BX Tissue fragment(s): 2 Size: 0.3 to 0.4 cm Description: tan fragments  Entirely submitted in 1 cassette(s).  Final Diagnosis performed by Bryan Lemma, MD.  Electronically signed 01/15/2017 5:27:37PM    The electronic signature indicates that the named Attending Pathologist has evaluated the specimen  Technical component performed at [facility], [address] [place], [place] [address] Lab: 8                         [phone] Dir: Darrick Penna. Evette Doffing, MD  Professional component performed at [facility], [facility], [place], [place], [place] [address] Lab: [phone] Dir: Dellia Nims. Rubinas, MD    . WBC 01/15/2017 7.1  3.6 - 11.0 K/uL Final  . RBC 01/15/2017 4.24  3.80 - 5.20 MIL/uL Final  . Hemoglobin 01/15/2017 12.8  12.0 - 16.0 g/dL Final  . HCT 01/15/2017 38.5  35.0 - 47.0 % Final  . MCV 01/15/2017 90.9  80.0 - 100.0 fL Final  . MCH 01/15/2017 30.3  26.0 - 34.0 pg Final  . MCHC 01/15/2017 33.4  32.0 - 36.0 g/dL Final  . RDW 01/15/2017 13.4  11.5 - 14.5 % Final  . Platelets 01/15/2017 332  150 - 440 K/uL Final  . Neutrophils Relative %  01/15/2017 68  % Final  . Neutro Abs 01/15/2017 4.8  1.4 - 6.5 K/uL Final  . Lymphocytes Relative 01/15/2017 24  % Final  . Lymphs Abs 01/15/2017 1.7  1.0 - 3.6 K/uL Final  . Monocytes Relative 01/15/2017 6  % Final  . Monocytes Absolute 01/15/2017 0.4  0.2 - 0.9 K/uL Final  . Eosinophils Relative 01/15/2017 2  % Final  . Eosinophils Absolute 01/15/2017 0.1  0 - 0.7 K/uL Final  . Basophils Relative 01/15/2017 0  % Final  . Basophils Absolute 01/15/2017 0.0  0 - 0.1 K/uL Final  . CEA 01/15/2017 31.6* 0.0 - 4.7 ng/mL Final   Comment: (NOTE)       Roche ECLIA methodology       Nonsmokers  <3.9                                     Smokers     <5.6 Performed At: [facility] [place], [place] [id number] Lindon Romp MD [id number]   . Ferritin 01/15/2017 30  11 - 307 ng/mL Final  . Iron 01/15/2017 37  28 - 170 ug/dL Final  . TIBC 01/15/2017 320  250 - 450 ug/dL Final  . Saturation Ratios 01/15/2017  12  10.4 - 31.8 % Final  . UIBC 01/15/2017 284  ug/dL Final    Assessment:  Tamara Hall is a 61 y.o. female with rectal cancer.   She presented with a 9 month history of rectal bleeding.  Colonoscopy on 01/14/2017 revealed a frond-like/villous non-obstructing large mass in the rectum. The mass was non-circumferential.  Biopsies revealed high grade dysplasia within an adenoma (? sampling error).  CEA was 31.6 on 01/15/2017.  Abdomen and pelvic CT on 01/13/2017 revealed irregular thickening of the rectum suspicious for carcinoma.  There was a 1.6 cm hypoattenuating lesion in the right hepatic lobe worrisome for metastatic disease.  Chest CT on 01/15/2017 revealed a 9 mm noncalcified left lower lobe pulmonary nodule (new).  Pelvic MRI on 01/16/2017 revealed rectal adenocarcinoma T3N1.  There was extension beyond the muscularis propria (17 mm).  There were mesorectal nodes >= 5 mm.  There was adenopathy on the left side of the mesorectum, including a 9 mm lesion.  The  distance from the tumor to the anal sphincter was 8 mm.  She has an 80 pack year smoking history.  Symptomatically, she has constipation and rectal bleeding.  Exam reveals tender lower abdomen without guarding or rebound (stable).  Plan: 1.  Discuss results of CT scans, pelvic MRI, and recent colonoscopy.  Discuss concern for rectal cancer.  Given imaging studies, suspect biopsy noting dysplasia is a sampling error.  Discuss possibility of repeat biopsy for confirmation of adenocarcinoma of the rectum.    Review multidisciplinary approach (surgery, radiation, chemotherapy).  Discuss patient's conversation with surgery while hospitalized.  Surgery was felt to be best performed by a specialized colorectal surgeon at a tertiary care center.  Discussed referral to [facility] or [facility].    Discuss possible metastatic disease to the liver and/or lung.  CEA is elevated.  Lung nodule could represent a second primary as patient has an extensive smoking history.  Discuss PET scan.  If isolated liver and/or lung metastasis, discuss potential resection (staged or synchronous with rectal lesion).  Coordination with surgery.  Discuss tentative plan for chemotherapy (FOLFOX) for 12-16 weeks followed by reassessment.  Discussed concurrent chemotherapy (Xeloda or 5FU) with radiation.  Discuss presentation at tumor board.  2.  Discussed management of constipation.  No current evidence of obstruction.   3.  Surgical evalation at [facility]. 4.  Present at tumor board on 01/23/2017. 5.  Schedule PET scan. 6.  Send tumor for MMR/MSI, KRAS/NRAS, and BRAF. 7.  RTC next week for MD assessment.   Lequita Asal, MD  01/20/2017

## 2017-01-21 ENCOUNTER — Telehealth: Payer: Self-pay | Admitting: *Deleted

## 2017-01-21 ENCOUNTER — Encounter: Payer: Self-pay | Admitting: *Deleted

## 2017-01-21 ENCOUNTER — Other Ambulatory Visit: Payer: Self-pay

## 2017-01-21 NOTE — Telephone Encounter (Signed)
Spoke with patient and gave her appt details for Dr. Cecil Cobbs. Pt was informed that her appt at [facility] is at [place] on Thursday 4/19 with Dr. Cecil Cobbs. She was instructed to arrive at 8:30am and to be expecting new pt package in mail tomorrow. Phone number and address provided for patient to [facility], [address] [place], [place]. Pt verbalized understanding.

## 2017-01-21 NOTE — Progress Notes (Signed)
  Oncology Nurse Navigator Documentation  Navigator Location: [facility] (01/21/17 1100)   )                              Interventions: Coordination of Care (01/21/17 1100)   Coordination of Care: Appts (01/21/17 1100)         message left with [facility] to refer new patient. Patient details left on voicemail and informed that appointment is need quickly. Informed to callback with appt information. Awaiting call back.          Time Spent with Patient: 15 (01/21/17 1100)

## 2017-01-23 ENCOUNTER — Encounter: Payer: Self-pay | Admitting: Hematology and Oncology

## 2017-01-26 ENCOUNTER — Other Ambulatory Visit: Payer: Self-pay | Admitting: Hematology and Oncology

## 2017-01-26 NOTE — Progress Notes (Signed)
Tamara Hall day:  01/27/2017   Chief Complaint: Tamara Hall is a 61 y.o. female with rectal cancer who is seen for assessment after interval [facility] surgical consultation.  HPI:  The patient was last seen in the medical oncology Hall on 01/20/2017.  At that time, she was seen for initial assessment after recent hospitalization for rectal bleeding.  Colonoscopy by Dr. Lucilla Lame on 01/13/2017 revealed a frond-like/villous non-obstructing large mass in the rectum. The mass was non-circumferential.  Biopsies revealed high grade dysplasia within an adenoma (felt to be a sampling error).    Chest, abdomen, and pelvic CT scan on 01/13/2017 suggested possible metastatic disease with a 1.6 cm lesion in the right hepatic lobe and a 9 mm non calcified left lower lobe pulmonary nodule.  Pelvic MRI on 01/25/2017 suggested a T3N1 lesion.  She was seen by Dr. Rolla Etienne, surgical oncologist at [facility], on 01/23/2017.  She discussed plans for biopsy of the lung or liver lesion with marker placement.  She  discussed liver MRI to assess known lesion and potential other lesions.  She discussed systemic chemotherapy and radiation upfront prior to surgical management.  The patient  was felt to need an APR with colostomy.   PET scan on 01/27/2017 revealed intense metabolic activity associated rectal mass consists with primary rectal carcinoma.  There was a hypermetabolic metastatic lesion to the central LEFT hepatic lobe.  There was metabolic activity associated with small LEFT lower lobe nodule which was most consistent with pulmonary metastasis.  Symptomatically, she denies any new complaints.  She has abdominal discomfort.  She denies any bleeding.   Past Medical History:  Diagnosis Date  . Hypertension     Past Surgical History:  Procedure Laterality Date  . ABDOMINAL HYSTERECTOMY    . BREAST SURGERY    . COLONOSCOPY WITH PROPOFOL N/A 01/14/2017   Procedure:  COLONOSCOPY WITH PROPOFOL;  Surgeon: Lucilla Lame, MD;  Location: [facility] ENDOSCOPY;  Service: Endoscopy;  Laterality: N/A;  . ELBOW SURGERY Right    Has had 4 surgeries on right elbow.  Marland Kitchen HAND SURGERY Right 2008  . JOINT REPLACEMENT    . TONSILLECTOMY      Family History  Problem Relation Age of Onset  . Lung cancer Mother   . Lung cancer Father   . Brain cancer Maternal Grandmother     Social History:  reports that she has been smoking Cigarettes.  She has a 80.00 pack-year smoking history. She has never used smokeless tobacco. She reports that she does not drink alcohol or use drugs.  She lives in [place].  She has 3 children (ages 53, 49, and 10).  She smokes 2 packs a day.  She started smoking at age 42.  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  She is alone today.  Allergies: No Known Allergies  Current Medications: Current Outpatient Prescriptions  Medication Sig Dispense Refill  . acetaminophen (TYLENOL) 325 MG tablet Take 2 tablets (650 mg total) by mouth every 6 (six) hours as needed for mild pain (or Fever >/= 101).    Marland Kitchen docusate sodium (COLACE) 100 MG capsule Take 1 capsule (100 mg total) by mouth 2 (two) times daily. 10 capsule 0  . HYDROcodone-acetaminophen (NORCO/VICODIN) 5-325 MG tablet Take 1 tablet by mouth every 6 (six) hours as needed for moderate pain. 30 tablet 0  . nicotine (NICODERM CQ - DOSED  IN MG/24 HOURS) 21 mg/24hr patch Place 1 patch (21 mg total) onto the skin daily. 28 patch 0  . oxyCODONE-acetaminophen (ROXICET) 5-325 MG tablet Take 1 tablet by mouth every 4 (four) hours as needed for severe pain. 30 tablet 0  . Magnesium Hydroxide (MILK OF MAGNESIA PO) Take by mouth.     No current facility-administered medications for this visit.     Review of Systems:  GENERAL:  Fatigue x 2 months.  No fevers or sweats.  Weight up 1 pound. PERFORMANCE STATUS (ECOG):  0 HEENT:  No visual changes, runny nose,  sore throat, mouth sores or tenderness. Lungs: No shortness of breath or cough.  No hemoptysis. Cardiac:  No chest pain, palpitations, orthopnea, or PND. GI:  Bloating.  Constipation.  No nausea, vomiting, diarrhea, or melena.  Intermittent rectal bleeding. GU:  No urgency, frequency, dysuria, or hematuria. Musculoskeletal:  No back pain.  No joint pain.  No muscle tenderness. Extremities:  No pain or swelling. Skin:  No rashes or skin changes. Neuro:  No headache, numbness or weakness, balance or coordination issues. Endocrine:  No diabetes, thyroid issues, hot flashes or night sweats. Psych:  Stress/anxiety.  No depression. Pain:  No focal pain. Review of systems:  All other systems reviewed and found to be negative.  Physical Exam: Blood pressure (!) 150/70, pulse 80, temperature 97 F (36.1 C), temperature source Tympanic, resp. rate 18, weight 182 lb 8 oz (82.8 kg). GENERAL:  Well developed, well nourished, woman sitting comfortably in the oncology Hall in no acute distress. MENTAL STATUS:  Alert and oriented to person, place and time. HEAD:  Long gray hair in a pony tail.  Normocephalic, atraumatic, face symmetric, no Cushingoid features. EYES:  Blue eyes.  No conjunctivitis or scleral icterus. NEUROLOGICAL: Unremarkable. PSYCH:  Appropriate.   Hospital Outpatient Visit on 01/27/2017  Component Date Value Ref Range Status  . Glucose-Capillary 01/27/2017 88  65 - 99 mg/dL Final    Assessment:  Tamara Hall is a 61 y.o. female with rectal cancer.   She presented with a 9 month history of rectal bleeding.  Colonoscopy on 01/14/2017 revealed a frond-like/villous non-obstructing large mass in the rectum. The mass was non-circumferential.  Biopsies revealed high grade dysplasia within an adenoma (? sampling error).  CEA was 31.6 on 01/15/2017.  Abdomen and pelvic CT on 01/13/2017 revealed irregular thickening of the rectum suspicious for carcinoma.  There was a 1.6 cm  hypoattenuating lesion in the right hepatic lobe worrisome for metastatic disease.  Chest CT on 01/15/2017 revealed a 9 mm noncalcified left lower lobe pulmonary nodule (new).  Pelvic MRI on 01/16/2017 revealed rectal adenocarcinoma T3N1.  There was extension beyond the muscularis propria (17 mm).  There were mesorectal nodes >= 5 mm.  There was adenopathy on the left side of the mesorectum, including a 9 mm lesion.  The distance from the tumor to the anal sphincter was 8 mm.  PET scan on 01/27/2017 revealed intense metabolic activity associated rectal mass consists with primary rectal carcinoma.  There was a hypermetabolic metastatic lesion to the central LEFT hepatic lobe.  There was metabolic activity associated with small LEFT lower lobe nodule which was most consistent with pulmonary metastasis.  She has an 80 pack year smoking history.  Symptomatically, she has constipation and rectal bleeding.  Exam reveals tender lower abdomen without guarding or rebound (stable).  Plan: 1.  Discuss imaging suggestive of liver metastasis +/- lung metastasis or possibly separate lung primary.  Discuss surgical consultation and plan for biopsy of liver and/or lung lesion at [facility].  Suspect lung lesion not amendable to biopsy based on review at tumor board (lesion too deep).  If lesion resolves with therapy, lesion was a metastasis.  If lesion grows, we will need to get tissue and/or resect in the future based on status of other disease.    Discuss liver MRI at [facility] to facilitate work-up and initiate therapy in the near future.  Discuss plan for systemic therapy (FOLFOX).  Side effects reviewed.  Discuss plan for chemotherapy class.  Discuss need for port-a-cath placement.  Discuss arranging needed studies while awaiting biopsy at [facility].  Patient in agreement.  Multiple questions asked and answered.  2.  Liver MRI STAT- assess liver lesion(s) 3.  Vascular: port-a-cath placement 4.  Chemotherapy class (FOLFOX) 5.   RTC after MRI for MD assessment and finalization of treatment plan.   Lequita Asal, MD  01/27/2017, 12:41 PM

## 2017-01-26 NOTE — Progress Notes (Signed)
START ON PATHWAY REGIMEN - Colorectal     A cycle is every 14 days:     Oxaliplatin      Leucovorin      5-Fluorouracil      5-Fluorouracil      Bevacizumab   **Always confirm dose/schedule in your pharmacy ordering system**    Patient Characteristics: Metastatic Colorectal, First Line, Potentially Resectable, KRAS Mutation Positive/Unknown, BRAF Wild-Type/Unknown Current evidence of distant metastases? Yes AJCC T Category: T3 AJCC N Category: N1 AJCC M Category: M1b AJCC 8 Stage Grouping: IVB BRAF Mutation Status: Awaiting Test Results KRAS/NRAS Mutation Status: Awaiting Test Results Line of therapy: First Line Would you be surprised if this patient died  in the next year? I would be surprised if this patient died in the next year  Intent of Therapy: Curative Intent, Discussed with Patient

## 2017-01-27 ENCOUNTER — Inpatient Hospital Stay (HOSPITAL_BASED_OUTPATIENT_CLINIC_OR_DEPARTMENT_OTHER): Payer: Managed Care, Other (non HMO) | Admitting: Hematology and Oncology

## 2017-01-27 ENCOUNTER — Encounter: Payer: Self-pay | Admitting: Hematology and Oncology

## 2017-01-27 ENCOUNTER — Encounter
Admission: RE | Admit: 2017-01-27 | Discharge: 2017-01-27 | Disposition: A | Discharge status: Home / Self Care | Payer: Managed Care, Other (non HMO) | Source: Ambulatory Visit | Attending: Hematology and Oncology | Admitting: Hematology and Oncology

## 2017-01-27 VITALS — BP 150/70 | HR 80 | Temp 97.0°F | Resp 18 | Wt 182.5 lb

## 2017-01-27 DIAGNOSIS — Z808 Family history of malignant neoplasm of other organs or systems: Secondary | ICD-10-CM

## 2017-01-27 DIAGNOSIS — I10 Essential (primary) hypertension: Secondary | ICD-10-CM

## 2017-01-27 DIAGNOSIS — R1909 Other intra-abdominal and pelvic swelling, mass and lump: Secondary | ICD-10-CM | POA: Diagnosis not present

## 2017-01-27 DIAGNOSIS — R918 Other nonspecific abnormal finding of lung field: Secondary | ICD-10-CM

## 2017-01-27 DIAGNOSIS — Z79899 Other long term (current) drug therapy: Secondary | ICD-10-CM | POA: Diagnosis not present

## 2017-01-27 DIAGNOSIS — K6289 Other specified diseases of anus and rectum: Secondary | ICD-10-CM | POA: Diagnosis present

## 2017-01-27 DIAGNOSIS — K5900 Constipation, unspecified: Secondary | ICD-10-CM | POA: Insufficient documentation

## 2017-01-27 DIAGNOSIS — K769 Liver disease, unspecified: Secondary | ICD-10-CM

## 2017-01-27 DIAGNOSIS — C20 Malignant neoplasm of rectum: Secondary | ICD-10-CM

## 2017-01-27 DIAGNOSIS — R970 Elevated carcinoembryonic antigen [CEA]: Secondary | ICD-10-CM

## 2017-01-27 DIAGNOSIS — R198 Other specified symptoms and signs involving the digestive system and abdomen: Secondary | ICD-10-CM

## 2017-01-27 DIAGNOSIS — F17210 Nicotine dependence, cigarettes, uncomplicated: Secondary | ICD-10-CM

## 2017-01-27 DIAGNOSIS — Z801 Family history of malignant neoplasm of trachea, bronchus and lung: Secondary | ICD-10-CM | POA: Diagnosis not present

## 2017-01-27 DIAGNOSIS — I714 Abdominal aortic aneurysm, without rupture: Secondary | ICD-10-CM | POA: Diagnosis not present

## 2017-01-27 LAB — GLUCOSE, CAPILLARY: Glucose-Capillary: 88 mg/dL (ref 65–99)

## 2017-01-27 MED ORDER — FLUDEOXYGLUCOSE F - 18 (FDG) INJECTION
12.0000 | Freq: Once | INTRAVENOUS | Status: AC | PRN
  Administered 2017-01-27: 12.22 via INTRAVENOUS

## 2017-01-27 NOTE — Progress Notes (Signed)
Patient here today for PET results. 

## 2017-01-28 ENCOUNTER — Other Ambulatory Visit (INDEPENDENT_AMBULATORY_CARE_PROVIDER_SITE_OTHER): Payer: Self-pay | Admitting: Vascular Surgery

## 2017-01-28 MED ORDER — CEFAZOLIN SODIUM-DEXTROSE 1-4 GM/50ML-% IV SOLN: 1.0000 g | Freq: Once | INTRAVENOUS | Status: DC

## 2017-01-28 MED ORDER — SODIUM CHLORIDE 0.9 % IR SOLN
Freq: Once | Status: DC
  Filled 2017-01-28: qty 2

## 2017-01-29 ENCOUNTER — Ambulatory Visit: Payer: Managed Care, Other (non HMO)

## 2017-01-29 ENCOUNTER — Encounter
Admission: RE | Disposition: A | Discharge status: Home / Self Care | Payer: Self-pay | Source: Ambulatory Visit | Attending: Vascular Surgery

## 2017-01-29 ENCOUNTER — Ambulatory Visit
Admission: RE | Admit: 2017-01-29 | Discharge: 2017-01-29 | Disposition: A | Discharge status: Home / Self Care | Payer: Managed Care, Other (non HMO) | Source: Ambulatory Visit | Attending: Vascular Surgery | Admitting: Vascular Surgery

## 2017-01-29 ENCOUNTER — Encounter: Payer: Self-pay | Admitting: *Deleted

## 2017-01-29 DIAGNOSIS — I10 Essential (primary) hypertension: Secondary | ICD-10-CM | POA: Insufficient documentation

## 2017-01-29 DIAGNOSIS — Z9889 Other specified postprocedural states: Secondary | ICD-10-CM | POA: Insufficient documentation

## 2017-01-29 DIAGNOSIS — C20 Malignant neoplasm of rectum: Secondary | ICD-10-CM | POA: Insufficient documentation

## 2017-01-29 DIAGNOSIS — F17210 Nicotine dependence, cigarettes, uncomplicated: Secondary | ICD-10-CM | POA: Insufficient documentation

## 2017-01-29 DIAGNOSIS — Z801 Family history of malignant neoplasm of trachea, bronchus and lung: Secondary | ICD-10-CM | POA: Diagnosis not present

## 2017-01-29 DIAGNOSIS — Z90710 Acquired absence of both cervix and uterus: Secondary | ICD-10-CM | POA: Diagnosis not present

## 2017-01-29 DIAGNOSIS — Z808 Family history of malignant neoplasm of other organs or systems: Secondary | ICD-10-CM | POA: Insufficient documentation

## 2017-01-29 DIAGNOSIS — Z9660 Presence of unspecified orthopedic joint implant: Secondary | ICD-10-CM | POA: Diagnosis not present

## 2017-01-29 DIAGNOSIS — R1909 Other intra-abdominal and pelvic swelling, mass and lump: Secondary | ICD-10-CM | POA: Diagnosis not present

## 2017-01-29 HISTORY — DX: Pneumonia, unspecified organism: J18.9

## 2017-01-29 HISTORY — DX: Malignant (primary) neoplasm, unspecified: C80.1

## 2017-01-29 HISTORY — PX: PORTA CATH INSERTION: CATH118285

## 2017-01-29 SURGERY — PORTA CATH INSERTION
Anesthesia: Moderate Sedation

## 2017-01-29 MED ORDER — ONDANSETRON HCL 4 MG/2ML IJ SOLN: 4.0000 mg | Freq: Four times a day (QID) | INTRAMUSCULAR | Status: DC | PRN

## 2017-01-29 MED ORDER — FENTANYL CITRATE (PF) 100 MCG/2ML IJ SOLN
INTRAMUSCULAR | Status: DC | PRN
  Administered 2017-01-29: 25 ug via INTRAVENOUS
  Administered 2017-01-29: 50 ug via INTRAVENOUS

## 2017-01-29 MED ORDER — SODIUM CHLORIDE 0.9 % IV SOLN
INTRAVENOUS | Status: DC
  Administered 2017-01-29: 08:00:00 via INTRAVENOUS

## 2017-01-29 MED ORDER — CEFAZOLIN SODIUM-DEXTROSE 2-4 GM/100ML-% IV SOLN
2.0000 g | Freq: Once | INTRAVENOUS | Status: AC
  Administered 2017-01-29: 2 g via INTRAVENOUS
  Filled 2017-01-29: qty 100

## 2017-01-29 MED ORDER — HYDROMORPHONE HCL 1 MG/ML IJ SOLN
1.0000 mg | Freq: Once | INTRAMUSCULAR | Status: AC | PRN
  Administered 2017-01-29: 0.5 mg via INTRAVENOUS

## 2017-01-29 MED ORDER — LIDOCAINE-EPINEPHRINE (PF) 2 %-1:200000 IJ SOLN
INTRAMUSCULAR | Status: AC
  Filled 2017-01-29: qty 20

## 2017-01-29 MED ORDER — FENTANYL CITRATE (PF) 100 MCG/2ML IJ SOLN
INTRAMUSCULAR | Status: AC
  Filled 2017-01-29: qty 2

## 2017-01-29 MED ORDER — HYDROMORPHONE HCL 1 MG/ML IJ SOLN
INTRAMUSCULAR | Status: AC
  Administered 2017-01-29: 0.5 mg via INTRAVENOUS
  Filled 2017-01-29: qty 1

## 2017-01-29 MED ORDER — MIDAZOLAM HCL 5 MG/5ML IJ SOLN
INTRAMUSCULAR | Status: AC
  Filled 2017-01-29: qty 5

## 2017-01-29 MED ORDER — HEPARIN (PORCINE) IN NACL 2-0.9 UNIT/ML-% IJ SOLN
INTRAMUSCULAR | Status: AC
  Filled 2017-01-29: qty 500

## 2017-01-29 MED ORDER — MIDAZOLAM HCL 2 MG/2ML IJ SOLN
INTRAMUSCULAR | Status: DC | PRN
  Administered 2017-01-29: 1 mg via INTRAVENOUS
  Administered 2017-01-29: 2 mg via INTRAVENOUS

## 2017-01-29 SURGICAL SUPPLY — 9 items
DERMABOND ADVANCED (GAUZE/BANDAGES/DRESSINGS) ×2
DERMABOND ADVANCED .7 DNX12 (GAUZE/BANDAGES/DRESSINGS) ×1 IMPLANT
KIT PORT POWER 8FR ISP CVUE (Catheter) ×3 IMPLANT
PACK ANGIOGRAPHY (CUSTOM PROCEDURE TRAY) ×3 IMPLANT
PAD GROUND ADULT SPLIT (MISCELLANEOUS) ×3 IMPLANT
PENCIL ELECTRO HAND CTR (MISCELLANEOUS) ×3 IMPLANT
SUT MNCRL AB 4-0 PS2 18 (SUTURE) ×3 IMPLANT
SUT PROLENE 0 CT 1 30 (SUTURE) ×3 IMPLANT
SUTURE VIC 3-0 (SUTURE) ×3 IMPLANT

## 2017-01-29 NOTE — H&P (Signed)
[facility] History & Physical Update  The patient was interviewed and re-examined.  The patient's previous History and Physical has been reviewed and is unchanged.  There is no change in the plan of care. We plan to proceed with the scheduled procedure.  Leotis Pain, MD  01/29/2017, 9:12 AM

## 2017-01-29 NOTE — OR Nursing (Signed)
Pt arrived saaying she had severe rectal pain. She didn't take am vicodin due to nausea if she takes npo. Given 0.5 mg dilaudid instead of ordered 1 mg with pt instruction that she can have second dose of 0.5 if pain continues or doesn't reduce to tolerable number. Pt reports pain improved after 10 minutes.

## 2017-01-29 NOTE — Op Note (Signed)
      [facility]       Operative Note  Date: 01/29/2017  Preoperative diagnosis:  1. Rectal cancer  Postoperative diagnosis:  Same as above  Procedures: #1. Ultrasound guidance for vascular access to the right internal jugular vein. #2. Fluoroscopic guidance for placement of catheter. #3. Placement of CT compatible Port-A-Cath, right internal jugular vein.  Surgeon: Leotis Pain, MD.   Anesthesia: Local with moderate conscious sedation for approximately 20 minutes using 3 mg of Versed and 75 mcg of Fentanyl  Fluoroscopy time: less than 1 minute  Contrast used: 0  Estimated blood loss: 5 cc  Indication for the procedure:  The patient is a 61 y.o.female with rectal cancer.  The patient needs a Port-A-Cath for durable venous access, chemotherapy, lab draws, and CT scans. We are asked to place this. Risks and benefits were discussed and informed consent was obtained.  Description of procedure: The patient was brought to the vascular and interventional radiology suite.  Moderate conscious sedation was administered throughout the procedure during a face to face encounter with the patient with my supervision of the RN administering medicines and monitoring the patient's vital signs, pulse oximetry, telemetry and mental status throughout from the start of the procedure until the patient was taken to the recovery room. The right neck chest and shoulder were sterilely prepped and draped, and a sterile surgical field was created. Ultrasound was used to help visualize a patent right internal jugular vein. This was then accessed under direct ultrasound guidance without difficulty with the Seldinger needle and a permanent image was recorded. A J-wire was placed. After skin nick and dilatation, the peel-away sheath was then placed over the wire. I then anesthetized an area under the clavicle approximately 1-2 fingerbreadths. A transverse incision was created and an inferior pocket was  created with electrocautery and blunt dissection. The port was then brought onto the field, placed into the pocket and secured to the chest wall with 2 Prolene sutures. The catheter was connected to the port and tunneled from the subclavicular incision to the access site. Fluoroscopic guidance was then used to cut the catheter to an appropriate length. The catheter was then placed through the peel-away sheath and the peel-away sheath was removed. The catheter tip was parked in excellent location under fluorocoscopic guidance in the cavoatrial junction. The pocket was then irrigated with antibiotic impregnated saline and the wound was closed with a running 3-0 Vicryl and a 4-0 Monocryl. The access incision was closed with a single 4-0 Monocryl. The Huber needle was used to withdraw blood and flush the port with heparinized saline. Dermabond was then placed as a dressing. The patient tolerated the procedure well and was taken to the recovery room in stable condition.   Leotis Pain 01/29/2017 9:48 AM   This note was created with Dragon Medical transcription system. Any errors in dictation are purely unintentional.

## 2017-01-30 ENCOUNTER — Encounter: Payer: Self-pay | Admitting: Vascular Surgery

## 2017-01-30 ENCOUNTER — Other Ambulatory Visit: Payer: Managed Care, Other (non HMO)

## 2017-01-30 ENCOUNTER — Inpatient Hospital Stay: Payer: Managed Care, Other (non HMO) | Admitting: Hematology and Oncology

## 2017-01-31 ENCOUNTER — Ambulatory Visit: Payer: Managed Care, Other (non HMO) | Admitting: Hematology and Oncology

## 2017-01-31 ENCOUNTER — Ambulatory Visit
Admission: RE | Admit: 2017-01-31 | Discharge: 2017-01-31 | Disposition: A | Discharge status: Home / Self Care | Payer: Managed Care, Other (non HMO) | Source: Ambulatory Visit | Attending: Hematology and Oncology | Admitting: Hematology and Oncology

## 2017-01-31 DIAGNOSIS — C20 Malignant neoplasm of rectum: Secondary | ICD-10-CM | POA: Diagnosis not present

## 2017-01-31 DIAGNOSIS — R198 Other specified symptoms and signs involving the digestive system and abdomen: Secondary | ICD-10-CM | POA: Insufficient documentation

## 2017-01-31 DIAGNOSIS — I714 Abdominal aortic aneurysm, without rupture: Secondary | ICD-10-CM | POA: Diagnosis not present

## 2017-01-31 DIAGNOSIS — C787 Secondary malignant neoplasm of liver and intrahepatic bile duct: Secondary | ICD-10-CM | POA: Diagnosis not present

## 2017-01-31 MED ORDER — GADOBENATE DIMEGLUMINE 529 MG/ML IV SOLN
15.0000 mL | Freq: Once | INTRAVENOUS | Status: AC | PRN
  Administered 2017-01-31: 15 mL via INTRAVENOUS

## 2017-02-03 ENCOUNTER — Ambulatory Visit: Payer: Managed Care, Other (non HMO) | Admitting: Hematology and Oncology

## 2017-02-03 NOTE — Patient Instructions (Signed)

## 2017-02-04 ENCOUNTER — Encounter: Payer: Self-pay | Admitting: Emergency Medicine

## 2017-02-04 ENCOUNTER — Inpatient Hospital Stay: Payer: Managed Care, Other (non HMO) | Attending: Hematology and Oncology

## 2017-02-04 ENCOUNTER — Emergency Department
Admission: EM | Admit: 2017-02-04 | Discharge: 2017-02-04 | Disposition: A | Discharge status: Home / Self Care | Payer: Managed Care, Other (non HMO) | Attending: Emergency Medicine | Admitting: Emergency Medicine

## 2017-02-04 ENCOUNTER — Inpatient Hospital Stay (HOSPITAL_BASED_OUTPATIENT_CLINIC_OR_DEPARTMENT_OTHER): Payer: Managed Care, Other (non HMO) | Admitting: Hematology and Oncology

## 2017-02-04 ENCOUNTER — Ambulatory Visit (INDEPENDENT_AMBULATORY_CARE_PROVIDER_SITE_OTHER): Payer: Managed Care, Other (non HMO) | Admitting: Vascular Surgery

## 2017-02-04 VITALS — BP 180/81 | HR 64 | Temp 96.7°F | Resp 21 | Wt 183.8 lb

## 2017-02-04 DIAGNOSIS — K5900 Constipation, unspecified: Secondary | ICD-10-CM | POA: Diagnosis not present

## 2017-02-04 DIAGNOSIS — Z5111 Encounter for antineoplastic chemotherapy: Secondary | ICD-10-CM | POA: Insufficient documentation

## 2017-02-04 DIAGNOSIS — R109 Unspecified abdominal pain: Secondary | ICD-10-CM | POA: Diagnosis not present

## 2017-02-04 DIAGNOSIS — G893 Neoplasm related pain (acute) (chronic): Secondary | ICD-10-CM | POA: Diagnosis not present

## 2017-02-04 DIAGNOSIS — F17210 Nicotine dependence, cigarettes, uncomplicated: Secondary | ICD-10-CM | POA: Diagnosis not present

## 2017-02-04 DIAGNOSIS — Z8701 Personal history of pneumonia (recurrent): Secondary | ICD-10-CM | POA: Diagnosis not present

## 2017-02-04 DIAGNOSIS — I10 Essential (primary) hypertension: Secondary | ICD-10-CM

## 2017-02-04 DIAGNOSIS — Z801 Family history of malignant neoplasm of trachea, bronchus and lung: Secondary | ICD-10-CM | POA: Insufficient documentation

## 2017-02-04 DIAGNOSIS — C20 Malignant neoplasm of rectum: Secondary | ICD-10-CM | POA: Diagnosis not present

## 2017-02-04 DIAGNOSIS — Z79899 Other long term (current) drug therapy: Secondary | ICD-10-CM | POA: Insufficient documentation

## 2017-02-04 DIAGNOSIS — G629 Polyneuropathy, unspecified: Secondary | ICD-10-CM | POA: Insufficient documentation

## 2017-02-04 DIAGNOSIS — Z85048 Personal history of other malignant neoplasm of rectum, rectosigmoid junction, and anus: Secondary | ICD-10-CM | POA: Diagnosis not present

## 2017-02-04 DIAGNOSIS — Z808 Family history of malignant neoplasm of other organs or systems: Secondary | ICD-10-CM | POA: Diagnosis not present

## 2017-02-04 DIAGNOSIS — C787 Secondary malignant neoplasm of liver and intrahepatic bile duct: Secondary | ICD-10-CM | POA: Diagnosis not present

## 2017-02-04 MED ORDER — HYDROCODONE-ACETAMINOPHEN 5-325 MG PO TABS: 1.0000 | ORAL_TABLET | Freq: Four times a day (QID) | ORAL | 0 refills | Status: DC | PRN

## 2017-02-04 NOTE — ED Triage Notes (Signed)
Pt to ed with c/o HTN today while at cancer center, pt states she was sent here due to the fact that her blood pressure did not come down with rest.  Pt alert and oriented, denies headache, denies chest pain. Pt states was on BP meds over 20 years ago but has been fine ever since.

## 2017-02-04 NOTE — Progress Notes (Signed)
[facility] [facility] day:  02/04/2017  Chief Complaint: Tamara Hall is a 61 y.o. female with rectal cancer who is seen for review of interval liver MRI.  HPI:  The patient was last seen in the medical oncology clinic on 01/27/2017.  At that time, PET scan revealed intense metabolic activity associated with the rectal mass.  There was a hypermetabolic metastatic lesion to the central LEFT hepatic lobe.  There was metabolic activity associated with small LEFT lower lobe nodule.  Care was being coordinated with Dr. Cecil Hall, colorectal surgeon at [facility].  Liver MRI was recommended as well as tissue confirmation of metastatic disease.  Liver MRI on 01/31/2017 revealed a 2.6 x 2.7 x 2.1 cm lesion in segment VIII of the liver near the junction with segment IVa c/w a metastatic lesion.  There was a 1.1 x 0.8 cm suspected metastatic lesion peripherally in segment VI of the liver.  In anticipation of chemotherapy, port-a-cath was placed by Dr. Lucky Hall on 01/29/2017.  She has chemotherapy class today from 9-12.  Symptomatically, she feels "okay". She feels that the port-a-cath is poking in when she lifts and turns her head.  She has lower quadrant pain (5 out of 10). She is out of Norco.   Past Medical History:  Diagnosis Date  . Cancer ([place])   . Hypertension   . Pneumonia    20+ years ago    Past Surgical History:  Procedure Laterality Date  . ABDOMINAL HYSTERECTOMY    . BREAST SURGERY    . COLONOSCOPY WITH PROPOFOL N/A 01/14/2017   Procedure: COLONOSCOPY WITH PROPOFOL;  Surgeon: Tamara Lame, MD;  Location: [facility] ENDOSCOPY;  Service: Endoscopy;  Laterality: N/A;  . ELBOW SURGERY Right    Has had 4 surgeries on right elbow.  Marland Kitchen HAND SURGERY Right 2008  . JOINT REPLACEMENT    . PORTA CATH INSERTION N/A 01/29/2017   Procedure: Tamara Hall Cath Insertion;  Surgeon: Tamara Huxley, MD;  Location: [facility];  Service: Cardiovascular;  Laterality: N/A;  .  TONSILLECTOMY      Family History  Problem Relation Age of Onset  . Lung cancer Mother   . Lung cancer Father   . Brain cancer Maternal Grandmother     Social History:  reports that she has been smoking Cigarettes.  She has a 80.00 pack-year smoking history. She has never used smokeless tobacco. She reports that she does not drink alcohol or use drugs.  She lives in [place].  She has 3 children (ages 21, 39, and 63).  She smokes 2 packs a day.  She started smoking at age 65.  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  She is accompanied by her significant other, Tamara Hall, today.  Allergies: No Known Allergies  Current Medications: Current Outpatient Prescriptions  Medication Sig Dispense Refill  . acetaminophen (TYLENOL) 325 MG tablet Take 2 tablets (650 mg total) by mouth every 6 (six) hours as needed for mild pain (or Fever >/= 101). (Patient taking differently: Take 650 mg by mouth 2 (two) times daily as needed for mild pain (or Fever >/= 101). )    . HYDROcodone-acetaminophen (NORCO/VICODIN) 5-325 MG tablet Take 1 tablet by mouth every 6 (six) hours as needed for moderate pain. 60 tablet 0  . Magnesium Hydroxide (MILK OF MAGNESIA PO) Take 30 mLs by mouth at bedtime.     . docusate sodium (  COLACE) 100 MG capsule Take 1 capsule (100 mg total) by mouth 2 (two) times daily. (Patient not taking: Reported on 01/28/2017) 10 capsule 0  . lidocaine-prilocaine (EMLA) cream Apply 1 application topically as needed. Apply small amount to port site at least 1 hour prior to it being accessed, cover with plastic wrap 30 g 1  . nicotine (NICODERM CQ - DOSED IN MG/24 HOURS) 21 mg/24hr patch Place 1 patch (21 mg total) onto the skin daily. (Patient not taking: Reported on 01/28/2017) 28 patch 0  . oxyCODONE-acetaminophen (ROXICET) 5-325 MG tablet Take 1 tablet by mouth every 4 (four) hours as needed for severe pain. (Patient not taking: Reported on 01/29/2017)  30 tablet 0   No current facility-administered medications for this visit.     Review of Systems:  GENERAL:  Fatigue.  No fevers or sweats.  Weight gain of 1 pound PERFORMANCE STATUS (ECOG):  1 HEENT:  No visual changes, runny nose, sore throat, mouth sores or tenderness. Lungs: No shortness of breath or cough.  No hemoptysis. Cardiac:  No chest pain, palpitations, orthopnea, or PND. GI:  Constipation.  Lower abdominal pain.  Rectal bleeding.  No nausea, vomiting, diarrhea, or melena. GU:  No urgency, frequency, dysuria, or hematuria. Musculoskeletal:  No back pain.  No joint pain.  No muscle tenderness. Extremities:  No pain or swelling. Skin:  No rashes or skin changes. Neuro:  No headache, numbness or weakness, balance or coordination issues. Endocrine:  No diabetes, thyroid issues, hot flashes or night sweats. Psych:  Stress/anxiety.  No depression. Pain: Neck pain associated with port.  Lower abdominal pain (5 out of 10). Review of systems:  All other systems reviewed and found to be negative.  Physical Exam: Blood pressure (!) 180/81, pulse 64, temperature (!) 96.7 F (35.9 C), temperature source Tympanic, resp. rate (!) 21, weight 183 lb 12.8 oz (83.4 kg). GENERAL:  Well developed, well nourished, woman sitting comfortably in the oncology clinic in no acute distress. MENTAL STATUS:  Alert and oriented to person, place and time. HEAD:  Long gray hair in a pony tail.  Normocephalic, atraumatic, face symmetric, no Cushingoid features. EYES:  Blue eyes.  No conjunctivitis or scleral icterus. CHEST WALL:  Right sided port-a-cath.  Skin overlying port and along tunneled catheter unremarkable. NEUROLOGICAL: Unremarkable. PSYCH:  Appropriate.   No visits with results within 3 Day(s) from this visit.  Latest known visit with results is:  Hospital Outpatient Visit on 01/27/2017  Component Date Value Ref Range Status  . Glucose-Capillary 01/27/2017 88  65 - 99 mg/dL Final     Assessment:  Tamara Hall is a 61 y.o. female with clinical stage T3N1M1 rectal cancer.   She presented with a 9 month history of rectal bleeding.  Colonoscopy on 01/14/2017 revealed a frond-like/villous non-obstructing large mass in the rectum. The mass was non-circumferential.  Biopsies revealed high grade dysplasia within an adenoma (? sampling error).  CEA was 31.6 on 01/15/2017.  Abdomen and pelvic CT on 01/13/2017 revealed irregular thickening of the rectum suspicious for carcinoma.  There was a 1.6 cm hypoattenuating lesion in the right hepatic lobe worrisome for metastatic disease.  Chest CT on 01/15/2017 revealed a 9 mm noncalcified left lower lobe pulmonary nodule (new).  Pelvic MRI on 01/16/2017 revealed rectal adenocarcinoma T3N1.  There was extension beyond the muscularis propria (17 mm).  There were mesorectal nodes >= 5 mm.  There was adenopathy on the left side of the mesorectum, including a 9 mm  lesion.  The distance from the tumor to the anal sphincter was 8 mm.  PET scan on 01/27/2017 revealed intense metabolic activity associated rectal mass consists with primary rectal carcinoma.  There was a hypermetabolic metastatic lesion to the central LEFT hepatic lobe.  There was metabolic activity associated with small LEFT lower lobe nodule which was most consistent with pulmonary metastasis.  Liver MRI on 01/31/2017 revealed a 2.6 x 2.7 x 2.1 cm lesion in segment VIII of the liver near the junction with segment IVa c/w a metastatic lesion.  There was a 1.1 x 0.8 cm suspected metastatic lesion peripherally in segment VI of the liver.  She has an 80 pack year smoking history.  Symptomatically, she has chronic lower abdominal pain.  Her port is tender with head movement.  Blood pressure is elevated.  Plan: 1.  Discuss results of recent liver MRI.  MRI documents a 2.6 cm metastasis as well as a suspected 1.1 cm metastasis.  Patient is scheduled for biopsy at [facility].  Review no plan to  biopsy lung lesion. 2.  Follow-up with Dr. Cecil Hall regarding MRI results and planned biopsy. 3.  Send tumor for MMR/MSI, KRAS/NRAS, and BRAF. 4.  Review treatment plan for potentially resectable metastatic rectal cancer. Begin with systemic chemotherapy followed by reimaging, concurrent chemotherapy and radiation, and then surgery.  Surgery may involve resection of liver and/or lung lesion. 5.  Patient to see Dr. Lucky Hall this afternoon to check port-pain/sticking sensation with arm and head movement 6.  Rx: hydrocodone 5/acetaminophen 325 mg 1 tablet po q 6 hrs prn pain (dis: #60). 7.  Chemotherapy class today. 8.  Recheck blood pressure.  If remains elevated, patient to be seen in ER. 9.  RTC on 05/14 for MD assess, labs (CBC with diff, CMP, CEA, Mg) and cycle #1 FOLFOX chemotherapy.   Lequita Asal, MD  02/04/2017

## 2017-02-04 NOTE — Progress Notes (Signed)
Patient c/o pain in right lower abdomen 5/10, out of norco at this time, unable to take the percocet r/t nausea after the pain med. Needs refill on norco.

## 2017-02-04 NOTE — ED Provider Notes (Signed)
[facility] Emergency Department Provider Note  ____________________________________________   First MD Initiated Contact with Patient 02/04/17 1242     (approximate)  I have reviewed the triage vital signs and the nursing notes.   HISTORY  Chief Complaint Hypertension   HPI Tamara Hall is a 61 y.o. female with a history of metastatic rectal cancer who is presenting to emergency Department today for hypertension. She says that she was going to her first [place] and she had her blood pressure taken and was found to be 180. After chemotherapy class she had her blood pressure retaken 161 systolic. She is denying any headache, paresthesia chest pain or shortness of breath. She says that she normally doesn't have hypertension but says that his anxiety she has been hypertensive in the past.   Past Medical History:  Diagnosis Date  . Cancer ([place])   . Hypertension   . Pneumonia    20+ years ago    Patient Active Problem List   Diagnosis Date Noted  . Abnormal findings-gastrointestinal tract   . Rectum neoplasm   . Rectal bleeding 01/13/2017  . Rectal cancer [facility])     Past Surgical History:  Procedure Laterality Date  . ABDOMINAL HYSTERECTOMY    . BREAST SURGERY    . COLONOSCOPY WITH PROPOFOL N/A 01/14/2017   Procedure: COLONOSCOPY WITH PROPOFOL;  Surgeon: Lucilla Lame, MD;  Location: [facility] ENDOSCOPY;  Service: Endoscopy;  Laterality: N/A;  . ELBOW SURGERY Right    Has had 4 surgeries on right elbow.  Marland Kitchen HAND SURGERY Right 2008  . JOINT REPLACEMENT    . PORTA CATH INSERTION N/A 01/29/2017   Procedure: Glori Luis Cath Insertion;  Surgeon: Algernon Huxley, MD;  Location: [place] CV LAB;  Service: Cardiovascular;  Laterality: N/A;  . TONSILLECTOMY      Prior to Admission medications   Medication Sig Start Date End Date Taking? Authorizing Provider  acetaminophen (TYLENOL) 325 MG tablet Take 2 tablets (650 mg total) by mouth every  6 (six) hours as needed for mild pain (or Fever >/= 101). Patient taking differently: Take 650 mg by mouth 2 (two) times daily as needed for mild pain (or Fever >/= 101).  01/15/17   Dustin Flock, MD  docusate sodium (COLACE) 100 MG capsule Take 1 capsule (100 mg total) by mouth 2 (two) times daily. Patient not taking: Reported on 01/28/2017 01/15/17   Dustin Flock, MD  HYDROcodone-acetaminophen (NORCO/VICODIN) 5-325 MG tablet Take 1 tablet by mouth every 6 (six) hours as needed for moderate pain. 02/04/17   Lequita Asal, MD  Magnesium Hydroxide (MILK OF MAGNESIA PO) Take 30 mLs by mouth at bedtime.     Historical Provider, MD  nicotine (NICODERM CQ - DOSED IN MG/24 HOURS) 21 mg/24hr patch Place 1 patch (21 mg total) onto the skin daily. Patient not taking: Reported on 01/28/2017 01/16/17   Dustin Flock, MD  oxyCODONE-acetaminophen (ROXICET) 5-325 MG tablet Take 1 tablet by mouth every 4 (four) hours as needed for severe pain. Patient not taking: Reported on 01/29/2017 01/15/17   Dustin Flock, MD    Allergies Patient has no known allergies.  Family History  Problem Relation Age of Onset  . Lung cancer Mother   . Lung cancer Father   . Brain cancer Maternal Grandmother     Social History Social History  Substance Use Topics  . Smoking status: Current Every Day Smoker    Packs/day: 2.00    Years: 40.00  Types: Cigarettes  . Smokeless tobacco: Never Used  . Alcohol use No    Review of Systems  Constitutional: No fever/chills Eyes: No visual changes. ENT: No sore throat. Cardiovascular: Denies chest pain. Respiratory: Denies shortness of breath. Gastrointestinal: No abdominal pain.  No nausea, no vomiting.  No diarrhea.  No constipation. Genitourinary: Negative for dysuria. Musculoskeletal: Negative for back pain. Skin: Negative for rash. Neurological: Negative for headaches, focal weakness or numbness.   ____________________________________________   PHYSICAL  EXAM:  VITAL SIGNS: ED Triage Vitals  Enc Vitals Group     BP 02/04/17 1229 (!) 169/66     Pulse Rate 02/04/17 1229 77     Resp 02/04/17 1229 18     Temp 02/04/17 1229 98.3 F (36.8 C)     Temp Source 02/04/17 1229 Oral     SpO2 02/04/17 1229 97 %     Weight 02/04/17 1229 183 lb (83 kg)     Height --      Head Circumference --      Peak Flow --      Pain Score 02/04/17 1228 5     Pain Loc --      Pain Edu? --      Excl. in [place]? --     Constitutional: Alert and oriented. Well appearing and in no acute distress. Eyes: Conjunctivae are normal. PERRL. EOMI. Head: Atraumatic. Nose: No congestion/rhinnorhea. Mouth/Throat: Mucous membranes are moist.   Neck: No stridor.   Cardiovascular: Normal rate, regular rhythm. Grossly normal heart sounds.   Respiratory: Normal respiratory effort.  No retractions. Lungs CTAB. Gastrointestinal: Soft and nontender. No distention. No abdominal bruits. No CVA tenderness. Musculoskeletal: No lower extremity tenderness nor edema.  Neurologic:  Normal speech and language. No gross focal neurologic deficits are appreciated.  Skin:  Skin is warm, dry and intact. No rash noted. Psychiatric: Mood and affect are normal. Speech and behavior are normal.  ____________________________________________   LABS (all labs ordered are listed, but only abnormal results are displayed)  Labs Reviewed - No data to display ____________________________________________  EKG   ____________________________________________  RADIOLOGY   ____________________________________________   PROCEDURES  Procedure(s) performed:   Procedures  Critical Care performed:   ____________________________________________   INITIAL IMPRESSION / ASSESSMENT AND PLAN / ED COURSE  Pertinent labs & imaging results that were available during my care of the patient were reviewed by me and considered in my medical decision making (see chart for details).  During my physical  examination the patient had a blood pressure 125/61. He says that she'll be following up with primary care [facility]. From her history, her hypertension seems to be situational. She has been a symptomatic. I do not see need further workup at this time. She is understanding of this plan and willing to comply. Will be discharged home.     ____________________________________________   FINAL CLINICAL IMPRESSION(S) / ED DIAGNOSES  A symptomatic hypertension.    NEW MEDICATIONS STARTED DURING THIS VISIT:  New Prescriptions   No medications on file     Note:  This document was prepared using Dragon voice recognition software and may include unintentional dictation errors.    Orbie Pyo, MD 02/04/17 1344

## 2017-02-04 NOTE — ED Notes (Signed)
Pt sts that she was sent to ED by cancer center, that "nurse there told me I could stroke out". Pt sts that cancer center nurse took BP when she got to cancer center at 8372 and systolic BP elevated at 902. Pt sts cancer center nurse retook BP after class and it was elevated but "she didn't tell me what it was". Pt denies any medical complaints at this time. Resp even and unlabored, ambulatory w/o issue, speaking w/o difficulty. NAD

## 2017-02-06 DIAGNOSIS — Z720 Tobacco use: Secondary | ICD-10-CM | POA: Insufficient documentation

## 2017-02-10 ENCOUNTER — Telehealth: Payer: Self-pay | Admitting: *Deleted

## 2017-02-10 MED ORDER — LIDOCAINE-PRILOCAINE 2.5-2.5 % EX CREA: 1.0000 "application " | TOPICAL_CREAM | CUTANEOUS | 1 refills | Status: DC | PRN

## 2017-02-10 NOTE — Telephone Encounter (Signed)
Needs rx for EMLA Cream

## 2017-02-14 ENCOUNTER — Other Ambulatory Visit: Payer: Self-pay | Admitting: Hematology and Oncology

## 2017-02-14 DIAGNOSIS — C20 Malignant neoplasm of rectum: Secondary | ICD-10-CM

## 2017-02-15 ENCOUNTER — Encounter: Payer: Self-pay | Admitting: Hematology and Oncology

## 2017-02-16 ENCOUNTER — Encounter: Payer: Self-pay | Admitting: Hematology and Oncology

## 2017-02-16 DIAGNOSIS — G893 Neoplasm related pain (acute) (chronic): Secondary | ICD-10-CM | POA: Insufficient documentation

## 2017-02-17 ENCOUNTER — Inpatient Hospital Stay: Payer: Managed Care, Other (non HMO) | Admitting: Hematology and Oncology

## 2017-02-17 ENCOUNTER — Inpatient Hospital Stay: Payer: Managed Care, Other (non HMO)

## 2017-02-18 ENCOUNTER — Encounter: Payer: Self-pay | Admitting: Hematology and Oncology

## 2017-02-18 ENCOUNTER — Other Ambulatory Visit: Payer: Self-pay | Admitting: *Deleted

## 2017-02-18 ENCOUNTER — Inpatient Hospital Stay: Payer: Managed Care, Other (non HMO)

## 2017-02-18 ENCOUNTER — Inpatient Hospital Stay (HOSPITAL_BASED_OUTPATIENT_CLINIC_OR_DEPARTMENT_OTHER): Payer: Managed Care, Other (non HMO) | Admitting: Hematology and Oncology

## 2017-02-18 VITALS — BP 138/74 | HR 78 | Temp 97.5°F | Wt 181.5 lb

## 2017-02-18 DIAGNOSIS — Z5111 Encounter for antineoplastic chemotherapy: Secondary | ICD-10-CM | POA: Insufficient documentation

## 2017-02-18 DIAGNOSIS — R109 Unspecified abdominal pain: Secondary | ICD-10-CM

## 2017-02-18 DIAGNOSIS — Z808 Family history of malignant neoplasm of other organs or systems: Secondary | ICD-10-CM

## 2017-02-18 DIAGNOSIS — C20 Malignant neoplasm of rectum: Secondary | ICD-10-CM

## 2017-02-18 DIAGNOSIS — K5900 Constipation, unspecified: Secondary | ICD-10-CM

## 2017-02-18 DIAGNOSIS — C787 Secondary malignant neoplasm of liver and intrahepatic bile duct: Secondary | ICD-10-CM

## 2017-02-18 DIAGNOSIS — G893 Neoplasm related pain (acute) (chronic): Secondary | ICD-10-CM

## 2017-02-18 DIAGNOSIS — Z801 Family history of malignant neoplasm of trachea, bronchus and lung: Secondary | ICD-10-CM | POA: Diagnosis not present

## 2017-02-18 DIAGNOSIS — Z79899 Other long term (current) drug therapy: Secondary | ICD-10-CM | POA: Diagnosis not present

## 2017-02-18 DIAGNOSIS — Z8701 Personal history of pneumonia (recurrent): Secondary | ICD-10-CM

## 2017-02-18 DIAGNOSIS — F17210 Nicotine dependence, cigarettes, uncomplicated: Secondary | ICD-10-CM | POA: Diagnosis not present

## 2017-02-18 DIAGNOSIS — Z7189 Other specified counseling: Secondary | ICD-10-CM | POA: Insufficient documentation

## 2017-02-18 DIAGNOSIS — I10 Essential (primary) hypertension: Secondary | ICD-10-CM

## 2017-02-18 LAB — CBC WITH DIFFERENTIAL/PLATELET
Basophils Absolute: 0 10*3/uL (ref 0–0.1)
Basophils Relative: 0 %
Eosinophils Absolute: 0.1 10*3/uL (ref 0–0.7)
Eosinophils Relative: 2 %
HCT: 35 % (ref 35.0–47.0)
Hemoglobin: 12.3 g/dL (ref 12.0–16.0)
Lymphocytes Relative: 30 %
Lymphs Abs: 2.3 10*3/uL (ref 1.0–3.6)
MCH: 31.4 pg (ref 26.0–34.0)
MCHC: 35.2 g/dL (ref 32.0–36.0)
MCV: 89.2 fL (ref 80.0–100.0)
Monocytes Absolute: 0.5 10*3/uL (ref 0.2–0.9)
Monocytes Relative: 7 %
Neutro Abs: 4.7 10*3/uL (ref 1.4–6.5)
Neutrophils Relative %: 61 %
Platelets: 300 10*3/uL (ref 150–440)
RBC: 3.93 MIL/uL (ref 3.80–5.20)
RDW: 13.2 % (ref 11.5–14.5)
WBC: 7.7 10*3/uL (ref 3.6–11.0)

## 2017-02-18 LAB — COMPREHENSIVE METABOLIC PANEL
ALT: 11 U/L — ABNORMAL LOW (ref 14–54)
AST: 17 U/L (ref 15–41)
Albumin: 3.8 g/dL (ref 3.5–5.0)
Alkaline Phosphatase: 79 U/L (ref 38–126)
Anion gap: 5 (ref 5–15)
BUN: 13 mg/dL (ref 6–20)
CO2: 26 mmol/L (ref 22–32)
Calcium: 8.8 mg/dL — ABNORMAL LOW (ref 8.9–10.3)
Chloride: 105 mmol/L (ref 101–111)
Creatinine, Ser: 0.88 mg/dL (ref 0.44–1.00)
GFR calc Af Amer: >60 mL/min (ref >60)
GFR calc non Af Amer: >60 mL/min (ref >60)
Glucose, Bld: 95 mg/dL (ref 65–99)
Potassium: 4 mmol/L (ref 3.5–5.1)
Sodium: 136 mmol/L (ref 135–145)
Total Bilirubin: 0.3 mg/dL (ref 0.3–1.2)
Total Protein: 6.8 g/dL (ref 6.5–8.1)

## 2017-02-18 LAB — MAGNESIUM: Magnesium: 2.1 mg/dL (ref 1.7–2.4)

## 2017-02-18 MED ORDER — FLUOROURACIL CHEMO INJECTION 2.5 GM/50ML
400.0000 mg/m2 | Freq: Once | INTRAVENOUS | Status: AC
  Administered 2017-02-18: 800 mg via INTRAVENOUS
  Filled 2017-02-18: qty 16

## 2017-02-18 MED ORDER — SODIUM CHLORIDE 0.9% FLUSH
10.0000 mL | INTRAVENOUS | Status: DC | PRN
  Administered 2017-02-18: 10 mL via INTRAVENOUS
  Filled 2017-02-18: qty 10

## 2017-02-18 MED ORDER — SODIUM CHLORIDE 0.9 % IV SOLN: 10.0000 mg | Freq: Once | INTRAVENOUS | Status: DC

## 2017-02-18 MED ORDER — DEXAMETHASONE SODIUM PHOSPHATE 10 MG/ML IJ SOLN
10.0000 mg | Freq: Once | INTRAMUSCULAR | Status: AC
  Administered 2017-02-18: 10 mg via INTRAVENOUS
  Filled 2017-02-18: qty 1

## 2017-02-18 MED ORDER — LORAZEPAM 0.5 MG PO TABS: 0.5000 mg | ORAL_TABLET | Freq: Four times a day (QID) | ORAL | 0 refills | Status: DC | PRN

## 2017-02-18 MED ORDER — HEPARIN SOD (PORK) LOCK FLUSH 100 UNIT/ML IV SOLN: 500.0000 [IU] | Freq: Once | INTRAVENOUS | Status: DC

## 2017-02-18 MED ORDER — OXYCODONE HCL 5 MG PO TABS: 5.0000 mg | ORAL_TABLET | ORAL | 0 refills | Status: DC | PRN

## 2017-02-18 MED ORDER — DEXTROSE 5 % IV SOLN
Freq: Once | INTRAVENOUS | Status: AC
  Administered 2017-02-18: 10:00:00 via INTRAVENOUS
  Filled 2017-02-18: qty 1000

## 2017-02-18 MED ORDER — OXALIPLATIN CHEMO INJECTION 100 MG/20ML
85.0000 mg/m2 | Freq: Once | INTRAVENOUS | Status: AC
  Administered 2017-02-18: 170 mg via INTRAVENOUS
  Filled 2017-02-18: qty 34

## 2017-02-18 MED ORDER — DEXTROSE 5 % IV SOLN
800.0000 mg | Freq: Once | INTRAVENOUS | Status: AC
  Administered 2017-02-18: 800 mg via INTRAVENOUS
  Filled 2017-02-18: qty 25

## 2017-02-18 MED ORDER — SODIUM CHLORIDE 0.9 % IV SOLN
2400.0000 mg/m2 | INTRAVENOUS | Status: DC
  Administered 2017-02-18: 4750 mg via INTRAVENOUS
  Filled 2017-02-18: qty 95

## 2017-02-18 MED ORDER — PALONOSETRON HCL INJECTION 0.25 MG/5ML
0.2500 mg | Freq: Once | INTRAVENOUS | Status: AC
  Administered 2017-02-18: 0.25 mg via INTRAVENOUS
  Filled 2017-02-18: qty 5

## 2017-02-18 MED ORDER — ONDANSETRON HCL 8 MG PO TABS: 8.0000 mg | ORAL_TABLET | Freq: Two times a day (BID) | ORAL | 1 refills | Status: DC | PRN

## 2017-02-18 NOTE — Progress Notes (Signed)
[place] [facility] day:  02/18/2017   Chief Complaint: Tamara Hall is a 61 y.o. female with stage IV rectal cancer who is seen for assessment prior to cycle #1 FOLFOX chemotherapy.  HPI:  The patient was last seen in the medical oncology clinic on 02/04/2017.  At that time, liver MRI had revealed 2 metastatic lesions.  She was scheduled for biopsy at [facility].  CT guided liver biopsy at [facility] on 02/07/2017 revealed malignant cells c/w metastatic colorectal adenocarcinoma.  She is scheduled to have liver directed therapy (ablation) at [facility] on 03/19/2017.  She continues to have aching, pressure like discomfort in her rectum.  She is taking Vicodin 5/325 every 4 hours for pain.  She is ready to begin chemotherapy.   Past Medical History:  Diagnosis Date  . Cancer ([place])   . Hypertension   . Pneumonia    20+ years ago    Past Surgical History:  Procedure Laterality Date  . ABDOMINAL HYSTERECTOMY    . BREAST SURGERY    . COLONOSCOPY WITH PROPOFOL N/A 01/14/2017   Procedure: COLONOSCOPY WITH PROPOFOL;  Surgeon: Lucilla Lame, MD;  Location: [facility] ENDOSCOPY;  Service: Endoscopy;  Laterality: N/A;  . ELBOW SURGERY Right    Has had 4 surgeries on right elbow.  Marland Kitchen HAND SURGERY Right 2008  . JOINT REPLACEMENT    . PORTA CATH INSERTION N/A 01/29/2017   Procedure: Glori Luis Cath Insertion;  Surgeon: Algernon Huxley, MD;  Location: [place] CV LAB;  Service: Cardiovascular;  Laterality: N/A;  . TONSILLECTOMY      Family History  Problem Relation Age of Onset  . Lung cancer Mother   . Lung cancer Father   . Brain cancer Maternal Grandmother     Social History:  reports that she has been smoking Cigarettes.  She has a 80.00 pack-year smoking history. She has never used smokeless tobacco. She reports that she does not drink alcohol or use drugs.  She lives in [place].  She has 3 children (ages 40, 76, and 61).  She smokes 2 packs a day.  She started smoking at age 10.  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  She is alone today.  Allergies: No Known Allergies  Current Medications: Current Outpatient Prescriptions  Medication Sig Dispense Refill  . acetaminophen (TYLENOL) 325 MG tablet Take 2 tablets (650 mg total) by mouth every 6 (six) hours as needed for mild pain (or Fever >/= 101). (Patient taking differently: Take 650 mg by mouth 2 (two) times daily as needed for mild pain (or Fever >/= 101). )    . busPIRone (BUSPAR) 7.5 MG tablet Take 7.5 mg by mouth.    Marland Kitchen HYDROcodone-acetaminophen (NORCO/VICODIN) 5-325 MG tablet Take 1 tablet by mouth every 6 (six) hours as needed for moderate pain. 60 tablet 0  . lidocaine-prilocaine (EMLA) cream Apply 1 application topically as needed. Apply small amount to port site at least 1 hour prior to it being accessed, cover with plastic wrap 30 g 1  . Magnesium Hydroxide (MILK OF MAGNESIA PO) Take 30 mLs by mouth at bedtime.     . polyethylene glycol (MIRALAX / GLYCOLAX) packet Take by mouth.     No current facility-administered medications for this visit.    Facility-Administered Medications Ordered in Other Visits  Medication Dose Route Frequency Provider Last Rate Last Dose  . heparin lock flush 100  unit/mL  500 Units Intravenous Once Corcoran, Melissa C, MD      . sodium chloride flush (NS) 0.9 % injection 10 mL  10 mL Intravenous PRN Nolon Stalls C, MD   10 mL at 02/18/17 0900    Review of Systems:  GENERAL:  Fatigue.  No fevers or sweats.  Weight down 2 pounds PERFORMANCE STATUS (ECOG):  1 HEENT:  No visual changes, runny nose, sore throat, mouth sores or tenderness. Lungs: No shortness of breath or cough.  No hemoptysis. Cardiac:  No chest pain, palpitations, orthopnea, or PND. GI:  Rectal pain (see HPI).  No nausea, vomiting, diarrhea, or melena. GU:  No urgency, frequency, dysuria, or hematuria. Musculoskeletal:  No back pain.  No joint pain.  No  muscle tenderness. Extremities:  No pain or swelling. Skin:  No rashes or skin changes. Neuro:  No headache, numbness or weakness, balance or coordination issues. Endocrine:  No diabetes, thyroid issues, hot flashes or night sweats. Psych:  Stress/anxiety.  No depression. Pain: Neck pain associated with port, resolved.  Rectal pain (8 out of 10). Review of systems:  All other systems reviewed and found to be negative.  Physical Exam: Blood pressure 138/74, pulse 78, temperature 97.5 F (36.4 C), temperature source Tympanic, weight 181 lb 8 oz (82.3 kg). GENERAL:  Well developed, well nourished, woman sitting in the oncology clinic in no acute distress.  She shifts her weight secondary to rectal discomfort. MENTAL STATUS:  Alert and oriented to person, place and time. HEAD:Long grayhair in a pony tail. Normocephalic, atraumatic, face symmetric, no Cushingoid features. EYES:Blueeyes. Pupils equal round and reactive to light and accomodation. No conjunctivitis or scleral icterus. ZOX:WRUEAVWUJW clear without lesion. Tonguenormal. Mucous membranes moist. RESPIRATORY:Clear to auscultationwithout rales, wheezes or rhonchi. CARDIOVASCULAR:Regular rate andrhythmwithout murmur, rub or gallop. ABDOMEN:Soft, nontender with active bowel soundsand no hepatosplenomegaly. No masses. SKIN: No rashes, ulcers or lesions. EXTREMITIES: No edema, no skin discoloration or tenderness. No palpable cords. LYMPHNODES: No palpable cervical, supraclavicular, axillary or inguinal adenopathy  NEUROLOGICAL: Unremarkable. PSYCH: Appropriate.   Infusion on 02/18/2017  Component Date Value Ref Range Status  . Sodium 02/18/2017 136  135 - 145 mmol/L Final  . Potassium 02/18/2017 4.0  3.5 - 5.1 mmol/L Final  . Chloride 02/18/2017 105  101 - 111 mmol/L Final  . CO2 02/18/2017 26  22 - 32 mmol/L Final  . Glucose, Bld 02/18/2017 95  65 - 99 mg/dL Final  . BUN 02/18/2017 13  6 - 20 mg/dL Final   . Creatinine, Ser 02/18/2017 0.88  0.44 - 1.00 mg/dL Final  . Calcium 02/18/2017 8.8* 8.9 - 10.3 mg/dL Final  . Total Protein 02/18/2017 6.8  6.5 - 8.1 g/dL Final  . Albumin 02/18/2017 3.8  3.5 - 5.0 g/dL Final  . AST 02/18/2017 17  15 - 41 U/L Final  . ALT 02/18/2017 11* 14 - 54 U/L Final  . Alkaline Phosphatase 02/18/2017 79  38 - 126 U/L Final  . Total Bilirubin 02/18/2017 0.3  0.3 - 1.2 mg/dL Final  . GFR calc non Af Amer 02/18/2017 >60  >60 mL/min Final  . GFR calc Af Amer 02/18/2017 >60  >60 mL/min Final   Comment: (NOTE) The eGFR has been calculated using the CKD EPI equation. This calculation has not been validated in all clinical situations. eGFR's persistently <60 mL/min signify possible Chronic Kidney Disease.   . Anion gap 02/18/2017 5  5 - 15 Final  . WBC 02/18/2017 7.7  3.6 - 11.0  K/uL Final  . RBC 02/18/2017 3.93  3.80 - 5.20 MIL/uL Final  . Hemoglobin 02/18/2017 12.3  12.0 - 16.0 g/dL Final  . HCT 02/18/2017 35.0  35.0 - 47.0 % Final  . MCV 02/18/2017 89.2  80.0 - 100.0 fL Final  . MCH 02/18/2017 31.4  26.0 - 34.0 pg Final  . MCHC 02/18/2017 35.2  32.0 - 36.0 g/dL Final  . RDW 02/18/2017 13.2  11.5 - 14.5 % Final  . Platelets 02/18/2017 300  150 - 440 K/uL Final  . Neutrophils Relative % 02/18/2017 61  % Final  . Neutro Abs 02/18/2017 4.7  1.4 - 6.5 K/uL Final  . Lymphocytes Relative 02/18/2017 30  % Final  . Lymphs Abs 02/18/2017 2.3  1.0 - 3.6 K/uL Final  . Monocytes Relative 02/18/2017 7  % Final  . Monocytes Absolute 02/18/2017 0.5  0.2 - 0.9 K/uL Final  . Eosinophils Relative 02/18/2017 2  % Final  . Eosinophils Absolute 02/18/2017 0.1  0 - 0.7 K/uL Final  . Basophils Relative 02/18/2017 0  % Final  . Basophils Absolute 02/18/2017 0.0  0 - 0.1 K/uL Final  . Magnesium 02/18/2017 2.1  1.7 - 2.4 mg/dL Final    Assessment:  Tamara Hall is a 61 y.o. female with clinical stage T3N1M1 rectal cancer.   She presented with a 9 month history of rectal bleeding.   Colonoscopy on 01/14/2017 revealed a frond-like/villous non-obstructing large mass in the rectum. The mass was non-circumferential.  Biopsies revealed high grade dysplasia within an adenoma (? sampling error).  CEA was 31.6 on 01/15/2017.  Abdomen and pelvic CT on 01/13/2017 revealed irregular thickening of the rectum suspicious for carcinoma.  There was a 1.6 cm hypoattenuating lesion in the right hepatic lobe worrisome for metastatic disease.  Chest CT on 01/15/2017 revealed a 9 mm noncalcified left lower lobe pulmonary nodule (new).  Pelvic MRI on 01/16/2017 revealed rectal adenocarcinoma T3N1.  There was extension beyond the muscularis propria (17 mm).  There were mesorectal nodes >= 5 mm.  There was adenopathy on the left side of the mesorectum, including a 9 mm lesion.  The distance from the tumor to the anal sphincter was 8 mm.  PET scan on 01/27/2017 revealed intense metabolic activity associated rectal mass consists with primary rectal carcinoma.  There was a hypermetabolic metastatic lesion to the central LEFT hepatic lobe.  There was metabolic activity associated with small LEFT lower lobe nodule which was most consistent with pulmonary metastasis.  Liver MRI on 01/31/2017 revealed a 2.6 x 2.7 x 2.1 cm lesion in segment VIII of the liver near the junction with segment IVa c/w a metastatic lesion.  There was a 1.1 x 0.8 cm suspected metastatic lesion peripherally in segment VI of the liver.  CT guided liver biopsy at [facility] on 02/07/2017 revealed malignant cells c/w metastatic colorectal adenocarcinoma.  She is scheduled to have liver directed therapy (ablation) at [facility] on 03/19/2017.  CEA has been followed:  31.6 on 01/15/2017 and 38.1 on 02/18/2017.  She has an 80 pack year smoking history.  Symptomatically, she has ongoing rectal pain on Vicodin 5/325 every 4 hours prn.  Exam is stable.  Plan: 1.  Labs today: CBC with diff, CMP, CEA, Mg. 2.  Discuss chemotherapy plan.  Discuss 12-16 weeks  of FOLFOX chemotherapy followed by reimaging, concurrent chemotherapy (Xeloda or CI 5FU) + radiation, and then surgery.  Surgery may involve resection of liver and/or lung lesion (staged or synchronous).  Patient  consented to treatment. 3.  Contact Dr. Cecil Cobbs re:  Send tumor for  MMR/MSI, KRAS/NRAS, and BRAF. 4.  Rx: oxycodone 5 mg po q 4 hours prn pain (dis: #30). 5.  Rx: ondansetron 8 mg BID prn nausea (dis: # 30) and Ativan 0.5 mg po q 6 hrs prn nausea (dis: #30).  Patient not to drive or operate heavy machinery when taking Ativan. 6.  Cycle #1 FOLFOX chemotherapy. 7.  RTC in 2 days for disconnect. 8.  RTC in 1 week for MD assessment, labs (CBC with diff, BMP). 9.  RTC in 2 weeks for MD assesssment, labs (CBC with diff, CMP, Mg, CEA) and cycle #2 FOLFOX chemotherapy.   Lequita Asal, MD  02/18/2017, 9:49 AM

## 2017-02-19 LAB — CEA: CEA: 38.1 ng/mL — ABNORMAL HIGH (ref 0.0–4.7)

## 2017-02-20 ENCOUNTER — Inpatient Hospital Stay: Payer: Managed Care, Other (non HMO)

## 2017-02-20 DIAGNOSIS — C20 Malignant neoplasm of rectum: Secondary | ICD-10-CM

## 2017-02-20 MED ORDER — HEPARIN SOD (PORK) LOCK FLUSH 100 UNIT/ML IV SOLN
INTRAVENOUS | Status: AC
  Filled 2017-02-20: qty 5

## 2017-02-20 MED ORDER — HEPARIN SOD (PORK) LOCK FLUSH 100 UNIT/ML IV SOLN
500.0000 [IU] | Freq: Once | INTRAVENOUS | Status: AC | PRN
  Administered 2017-02-20: 500 [IU]

## 2017-02-20 MED ORDER — SODIUM CHLORIDE 0.9% FLUSH
10.0000 mL | INTRAVENOUS | Status: DC | PRN
  Administered 2017-02-20: 10 mL
  Filled 2017-02-20: qty 10

## 2017-02-25 ENCOUNTER — Inpatient Hospital Stay: Payer: Managed Care, Other (non HMO)

## 2017-02-25 ENCOUNTER — Telehealth: Payer: Self-pay | Admitting: *Deleted

## 2017-02-25 ENCOUNTER — Inpatient Hospital Stay (HOSPITAL_BASED_OUTPATIENT_CLINIC_OR_DEPARTMENT_OTHER): Payer: Managed Care, Other (non HMO) | Admitting: Hematology and Oncology

## 2017-02-25 VITALS — BP 135/78 | HR 76 | Temp 96.6°F | Resp 18 | Wt 178.5 lb

## 2017-02-25 DIAGNOSIS — C20 Malignant neoplasm of rectum: Secondary | ICD-10-CM

## 2017-02-25 DIAGNOSIS — G629 Polyneuropathy, unspecified: Secondary | ICD-10-CM

## 2017-02-25 DIAGNOSIS — R109 Unspecified abdominal pain: Secondary | ICD-10-CM | POA: Diagnosis not present

## 2017-02-25 DIAGNOSIS — C787 Secondary malignant neoplasm of liver and intrahepatic bile duct: Secondary | ICD-10-CM | POA: Insufficient documentation

## 2017-02-25 DIAGNOSIS — Z801 Family history of malignant neoplasm of trachea, bronchus and lung: Secondary | ICD-10-CM

## 2017-02-25 DIAGNOSIS — K5900 Constipation, unspecified: Secondary | ICD-10-CM

## 2017-02-25 DIAGNOSIS — Z8701 Personal history of pneumonia (recurrent): Secondary | ICD-10-CM | POA: Diagnosis not present

## 2017-02-25 DIAGNOSIS — F17210 Nicotine dependence, cigarettes, uncomplicated: Secondary | ICD-10-CM

## 2017-02-25 DIAGNOSIS — Z808 Family history of malignant neoplasm of other organs or systems: Secondary | ICD-10-CM

## 2017-02-25 DIAGNOSIS — Z79899 Other long term (current) drug therapy: Secondary | ICD-10-CM

## 2017-02-25 DIAGNOSIS — G893 Neoplasm related pain (acute) (chronic): Secondary | ICD-10-CM | POA: Diagnosis not present

## 2017-02-25 DIAGNOSIS — I10 Essential (primary) hypertension: Secondary | ICD-10-CM

## 2017-02-25 LAB — BASIC METABOLIC PANEL
Anion gap: 4 — ABNORMAL LOW (ref 5–15)
BUN: 15 mg/dL (ref 6–20)
CO2: 28 mmol/L (ref 22–32)
Calcium: 8.7 mg/dL — ABNORMAL LOW (ref 8.9–10.3)
Chloride: 105 mmol/L (ref 101–111)
Creatinine, Ser: 0.77 mg/dL (ref 0.44–1.00)
GFR calc Af Amer: >60 mL/min (ref >60)
GFR calc non Af Amer: >60 mL/min (ref >60)
Glucose, Bld: 112 mg/dL — ABNORMAL HIGH (ref 65–99)
Potassium: 3.9 mmol/L (ref 3.5–5.1)
Sodium: 137 mmol/L (ref 135–145)

## 2017-02-25 LAB — CBC WITH DIFFERENTIAL/PLATELET
Basophils Absolute: 0 10*3/uL (ref 0–0.1)
Basophils Relative: 0 %
Eosinophils Absolute: 0.3 10*3/uL (ref 0–0.7)
Eosinophils Relative: 4 %
HCT: 36.2 % (ref 35.0–47.0)
Hemoglobin: 12.8 g/dL (ref 12.0–16.0)
Lymphocytes Relative: 31 %
Lymphs Abs: 2.5 10*3/uL (ref 1.0–3.6)
MCH: 31 pg (ref 26.0–34.0)
MCHC: 35.3 g/dL (ref 32.0–36.0)
MCV: 87.8 fL (ref 80.0–100.0)
Monocytes Absolute: 0.4 10*3/uL (ref 0.2–0.9)
Monocytes Relative: 5 %
Neutro Abs: 4.8 10*3/uL (ref 1.4–6.5)
Neutrophils Relative %: 60 %
Platelets: 293 10*3/uL (ref 150–440)
RBC: 4.12 MIL/uL (ref 3.80–5.20)
RDW: 13.3 % (ref 11.5–14.5)
WBC: 8 10*3/uL (ref 3.6–11.0)

## 2017-02-25 LAB — MAGNESIUM: Magnesium: 2.3 mg/dL (ref 1.7–2.4)

## 2017-02-25 MED ORDER — HYDROCODONE-ACETAMINOPHEN 7.5-300 MG PO TABS: 1.0000 | ORAL_TABLET | Freq: Four times a day (QID) | ORAL | 0 refills | Status: DC | PRN

## 2017-02-25 NOTE — Progress Notes (Signed)
[facility] day:  02/25/2017   Chief Complaint: Tamara Hall is a 61 y.o. female with stage IV rectal cancer who is seen for nadir assessment on day 8 of cycle #1 FOLFOX chemotherapy.  HPI:  The patient was last seen in the medical oncology clinic on 02/18/2017.  At that time, she received cycle #1 FOLFOX chemotherapy.  Pain was poorly controlled on Vicodin 5/325 mg.  She was given a prescription for oxycodone 5 mg every 4 hours prn pain.  She was to keep a pain diary for conversion to long acting pain medications.  She tolerated her chemotherapy well.  She developed transient cold sensitivity in her mouth and fingertips.  She denies any residual neuropathy.  She believes the pain pills made her sick. She wants to switch back to Vicodin.  Bowel movements are soft (not diarrhea) with milk of magnesia.  Miralax did not work.  She would like to postpone her liver lesion ablation and wait to see how her liver looks with the first follow-up scan.   Past Medical History:  Diagnosis Date  . Cancer ([place])   . Hypertension   . Pneumonia    20+ years ago    Past Surgical History:  Procedure Laterality Date  . ABDOMINAL HYSTERECTOMY    . BREAST SURGERY    . COLONOSCOPY WITH PROPOFOL N/A 01/14/2017   Procedure: COLONOSCOPY WITH PROPOFOL;  Surgeon: Lucilla Lame, MD;  Location: [facility] ENDOSCOPY;  Service: Endoscopy;  Laterality: N/A;  . ELBOW SURGERY Right    Has had 4 surgeries on right elbow.  Marland Kitchen HAND SURGERY Right 2008  . JOINT REPLACEMENT    . PORTA CATH INSERTION N/A 01/29/2017   Procedure: Glori Luis Cath Insertion;  Surgeon: Algernon Huxley, MD;  Location: [facility];  Service: Cardiovascular;  Laterality: N/A;  . TONSILLECTOMY      Family History  Problem Relation Age of Onset  . Lung cancer Mother   . Lung cancer Father   . Brain cancer Maternal Grandmother     Social History:  reports that she has been smoking Cigarettes.  She has a 80.00  pack-year smoking history. She has never used smokeless tobacco. She reports that she does not drink alcohol or use drugs.  She lives in [place].  She has 3 children (ages 21, 77, and 4).  She smokes 2 packs a day.  She started smoking at age 50.  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  [name] is her significant other.  She is alone today.  Allergies: No Known Allergies  Current Medications: Current Outpatient Prescriptions  Medication Sig Dispense Refill  . acetaminophen (TYLENOL) 325 MG tablet Take 2 tablets (650 mg total) by mouth every 6 (six) hours as needed for mild pain (or Fever >/= 101). (Patient taking differently: Take 650 mg by mouth 2 (two) times daily as needed for mild pain (or Fever >/= 101). )    . busPIRone (BUSPAR) 7.5 MG tablet Take 7.5 mg by mouth.    Marland Kitchen HYDROcodone-acetaminophen (NORCO/VICODIN) 5-325 MG tablet Take 1 tablet by mouth every 6 (six) hours as needed for moderate pain. 60 tablet 0  . lidocaine-prilocaine (EMLA) cream Apply 1 application topically as needed. Apply small amount to port site at least 1 hour prior to it being accessed, cover with plastic wrap 30 g 1  . LORazepam (ATIVAN) 0.5 MG tablet Take  1 tablet (0.5 mg total) by mouth every 6 (six) hours as needed (Nausea or vomiting). 30 tablet 0  . Magnesium Hydroxide (MILK OF MAGNESIA PO) Take 30 mLs by mouth at bedtime.     . ondansetron (ZOFRAN) 8 MG tablet Take 1 tablet (8 mg total) by mouth 2 (two) times daily as needed for refractory nausea / vomiting. Start on day 3 after chemotherapy. 30 tablet 1  . oxyCODONE (OXY IR/ROXICODONE) 5 MG immediate release tablet Take 1 tablet (5 mg total) by mouth every 4 (four) hours as needed for severe pain. 30 tablet 0  . polyethylene glycol (MIRALAX / GLYCOLAX) packet Take by mouth.     No current facility-administered medications for this visit.     Review of Systems:  GENERAL:  Fatigue.  No fevers or sweats.   Weight down 3 pounds. PERFORMANCE STATUS (ECOG):  1 HEENT:  No visual changes, runny nose, sore throat, mouth sores or tenderness. Lungs: No shortness of breath or cough.  No hemoptysis. Cardiac:  No chest pain, palpitations, orthopnea, or PND.  Elevated BP due to anxiety. GI:  Constipation relieved with MOM.  Rectal pain and some bleeding (see HPI).  Nausea with oxycodone.  No vomiting, diarrhea, or melena. GU:  No urgency, frequency, dysuria, or hematuria. Musculoskeletal:  No back pain.  No joint pain.  No muscle tenderness. Extremities:  No pain or swelling. Skin:  No rashes or skin changes. Neuro:  No headache, numbness or weakness, balance or coordination issues. Endocrine:  No diabetes, thyroid issues, hot flashes or night sweats. Psych:  Stress/anxiety.  No depression. Pain: Rectal pain (8 out of 10). Review of systems:  All other systems reviewed and found to be negative.  Physical Exam: Blood pressure 135/78, pulse 76, temperature (!) 96.6 F (35.9 C), temperature source Tympanic, resp. rate 18, weight 178 lb 8 oz (81 kg). GENERAL:  Well developed, well nourished, woman sitting in the oncology clinic in no acute distress.  She shifts her weight secondary to rectal discomfort aggravated by sitting. MENTAL STATUS:  Alert and oriented to person, place and time. HEAD:Long grayhair in a pony tail. Normocephalic, atraumatic, face symmetric, no Cushingoid features. EYES:Blueeyes. Pupils equal round and reactive to light and accomodation. No conjunctivitis or scleral icterus. SLH:[id number] clear without lesion. Tonguenormal. Mucous membranes moist. RESPIRATORY:Clear to auscultationwithout rales, wheezes or rhonchi. CARDIOVASCULAR:Regular rate andrhythmwithout murmur, rub or gallop. ABDOMEN:Soft, non-tender with active bowel soundsand no hepatosplenomegaly. No masses. SKIN: No rashes, ulcers or lesions. EXTREMITIES: No edema, no skin discoloration or  tenderness. No palpable cords. LYMPHNODES: No palpable cervical, supraclavicular, axillary or inguinal adenopathy  NEUROLOGICAL: Unremarkable. PSYCH: Appropriate.   Appointment on 02/25/2017  Component Date Value Ref Range Status  . WBC 02/25/2017 8.0  3.6 - 11.0 K/uL Final  . RBC 02/25/2017 4.12  3.80 - 5.20 MIL/uL Final  . Hemoglobin 02/25/2017 12.8  12.0 - 16.0 g/dL Final  . HCT 02/25/2017 36.2  35.0 - 47.0 % Final  . MCV 02/25/2017 87.8  80.0 - 100.0 fL Final  . MCH 02/25/2017 31.0  26.0 - 34.0 pg Final  . MCHC 02/25/2017 35.3  32.0 - 36.0 g/dL Final  . RDW 02/25/2017 13.3  11.5 - 14.5 % Final  . Platelets 02/25/2017 293  150 - 440 K/uL Final  . Neutrophils Relative % 02/25/2017 60  % Final  . Neutro Abs 02/25/2017 4.8  1.4 - 6.5 K/uL Final  . Lymphocytes Relative 02/25/2017 31  % Final  . Lymphs Abs 02/25/2017  2.5  1.0 - 3.6 K/uL Final  . Monocytes Relative 02/25/2017 5  % Final  . Monocytes Absolute 02/25/2017 0.4  0.2 - 0.9 K/uL Final  . Eosinophils Relative 02/25/2017 4  % Final  . Eosinophils Absolute 02/25/2017 0.3  0 - 0.7 K/uL Final  . Basophils Relative 02/25/2017 0  % Final  . Basophils Absolute 02/25/2017 0.0  0 - 0.1 K/uL Final  . Sodium 02/25/2017 137  135 - 145 mmol/L Final  . Potassium 02/25/2017 3.9  3.5 - 5.1 mmol/L Final  . Chloride 02/25/2017 105  101 - 111 mmol/L Final  . CO2 02/25/2017 28  22 - 32 mmol/L Final  . Glucose, Bld 02/25/2017 112* 65 - 99 mg/dL Final  . BUN 02/25/2017 15  6 - 20 mg/dL Final  . Creatinine, Ser 02/25/2017 0.77  0.44 - 1.00 mg/dL Final  . Calcium 02/25/2017 8.7* 8.9 - 10.3 mg/dL Final  . GFR calc non Af Amer 02/25/2017 >60  >60 mL/min Final  . GFR calc Af Amer 02/25/2017 >60  >60 mL/min Final   Comment: (NOTE) The eGFR has been calculated using the CKD EPI equation. This calculation has not been validated in all clinical situations. eGFR's persistently <60 mL/min signify possible Chronic Kidney Disease.   . Anion gap  02/25/2017 4* 5 - 15 Final    Assessment:  Tamara Hall is a 61 y.o. female with clinical stage T3N1M1 rectal cancer.   She presented with a 9 month history of rectal bleeding.  Colonoscopy on 01/14/2017 revealed a frond-like/villous non-obstructing large mass in the rectum. The mass was non-circumferential.  Biopsies revealed high grade dysplasia within an adenoma (? sampling error).  CEA was 31.6 on 01/15/2017.  Abdomen and pelvic CT on 01/13/2017 revealed irregular thickening of the rectum suspicious for carcinoma.  There was a 1.6 cm hypoattenuating lesion in the right hepatic lobe worrisome for metastatic disease.  Chest CT on 01/15/2017 revealed a 9 mm noncalcified left lower lobe pulmonary nodule (new).  Pelvic MRI on 01/16/2017 revealed rectal adenocarcinoma T3N1.  There was extension beyond the muscularis propria (17 mm).  There were mesorectal nodes >= 5 mm.  There was adenopathy on the left side of the mesorectum, including a 9 mm lesion.  The distance from the tumor to the anal sphincter was 8 mm.  PET scan on 01/27/2017 revealed intense metabolic activity associated rectal mass consists with primary rectal carcinoma.  There was a hypermetabolic metastatic lesion to the central LEFT hepatic lobe.  There was metabolic activity associated with small LEFT lower lobe nodule which was most consistent with pulmonary metastasis.  Liver MRI on 01/31/2017 revealed a 2.6 x 2.7 x 2.1 cm lesion in segment VIII of the liver near the junction with segment IVa c/w a metastatic lesion.  There was a 1.1 x 0.8 cm suspected metastatic lesion peripherally in segment VI of the liver.  CT guided liver biopsy at [facility] on 02/07/2017 revealed malignant cells c/w metastatic colorectal adenocarcinoma.  Tumor was sent for  MMR/MSI, KRAS/NRAS, and BRAF.  She is scheduled to have liver directed therapy (ablation) at [facility] on 03/19/2017.  CEA has been followed: 31.6 on 01/15/2017 and 38.1 on 02/18/2017.  She is currently  day 8 of cycle #1 FOLFOX chemotherapy (02/18/2017).  She developed transient cold induced neuropathy.  Constipation is managed with milk of magnesia.  She has an 80 pack year smoking history.  Symptomatically, she has chronic rectal pain.  Oxycodone causes nausea.  Exam is stable.  Magnesium is 2.3.  Plan: 1.  Labs today: CBC with diff, BMP, Mg. 2.  Discontinue oxycodone. 3.  Rx: hydrocodone 7.5/300 1 tablet po q 6 hours prn pain (dis: #60). 4.  Patient to keep pain diary for long acting pain medication. 5.  Contact Dr. Cecil Cobbs re: postponing liver directed therapy until after first scan per patient request. 6.  RTC in 1 week for MD assesssment, labs (CBC with diff, CMP, Mg, CEA) and cycle #2 FOLFOX chemotherapy.   Lequita Asal, MD  02/25/2017, 11:44 AM

## 2017-02-25 NOTE — Telephone Encounter (Signed)
-----   Message from Lequita Asal, MD sent at 02/25/2017  1:25 PM EDT ----- Regarding: Please call patient  Magnesium ok.  M  ----- Message ----- From: Interface, Lab In [place] Sent: 02/25/2017  12:21 PM To: Lequita Asal, MD

## 2017-02-25 NOTE — Progress Notes (Signed)
Patient has new PCP - Dr. Severiano Gilbert.  States PCP feels BP being elevated is more of an anxiety issue.  Patient is tearful this morning stating MD is going to be mad at her.  She cannot take her Percocet because they nauseate her.  She is asking if she can have something else.  She does have oxycodone 5 mg prescription.  Further states she is having a lot of bleeding from her rectum.  States it is hard to sit because she is so sore.

## 2017-02-25 NOTE — Telephone Encounter (Signed)
Called patient to let her know her magnesium is okay.

## 2017-03-03 ENCOUNTER — Encounter: Payer: Self-pay | Admitting: Hematology and Oncology

## 2017-03-03 NOTE — Progress Notes (Signed)
[facility] day:  03/04/2017   Chief Complaint: Tamara Hall is a 61 y.o. female with stage IV rectal cancer who is seen for assessment prior to cycle #2 FOLFOX chemotherapy.  HPI:  The patient was last seen in the medical oncology clinic on 02/25/2017.  At that time, she was seen for nadir assessment s/p cycle #1 FOLFOX chemotherapy.  She tolerated her chemotherapy well.  She had a transient cold neuropathy.  Oxycodone was causing nausea.  She was given a prescription for hydrocodone 7.5/300.  Constipation was managed with milk of magnesia.  She states that the new pain medication is "so much better". She denies any nausea. She has discomfort up under her right rib. She takes milk of magnesia regularly to manage constipation.   Past Medical History:  Diagnosis Date  . Cancer ([place])   . Hypertension   . Pneumonia    20+ years ago    Past Surgical History:  Procedure Laterality Date  . ABDOMINAL HYSTERECTOMY    . BREAST SURGERY    . COLONOSCOPY WITH PROPOFOL N/A 01/14/2017   Procedure: COLONOSCOPY WITH PROPOFOL;  Surgeon: Lucilla Lame, MD;  Location: [facility] ENDOSCOPY;  Service: Endoscopy;  Laterality: N/A;  . ELBOW SURGERY Right    Has had 4 surgeries on right elbow.  Marland Kitchen HAND SURGERY Right 2008  . JOINT REPLACEMENT    . PORTA CATH INSERTION N/A 01/29/2017   Procedure: Glori Luis Cath Insertion;  Surgeon: Algernon Huxley, MD;  Location: [facility];  Service: Cardiovascular;  Laterality: N/A;  . TONSILLECTOMY      Family History  Problem Relation Age of Onset  . Lung cancer Mother   . Lung cancer Father   . Brain cancer Maternal Grandmother     Social History:  reports that she has been smoking Cigarettes.  She has a 80.00 pack-year smoking history. She has never used smokeless tobacco. She reports that she does not drink alcohol or use drugs.  She lives in [place].  She has 3 children (ages 17, 52, and 91).  She smokes 2 packs a day.  She  started smoking at age 44.  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  [name] is her significant other.  She is alone today.  Allergies: No Known Allergies  Current Medications: Current Outpatient Prescriptions  Medication Sig Dispense Refill  . acetaminophen (TYLENOL) 325 MG tablet Take 2 tablets (650 mg total) by mouth every 6 (six) hours as needed for mild pain (or Fever >/= 101). (Patient taking differently: Take 650 mg by mouth 2 (two) times daily as needed for mild pain (or Fever >/= 101). )    . HYDROcodone-acetaminophen (NORCO/VICODIN) 5-325 MG tablet Take 1 tablet by mouth every 6 (six) hours as needed for moderate pain. 60 tablet 0  . Hydrocodone-Acetaminophen 7.5-300 MG TABS Take 1 tablet by mouth every 6 (six) hours as needed (pain). 60 each 0  . lidocaine-prilocaine (EMLA) cream Apply 1 application topically as needed. Apply small amount to port site at least 1 hour prior to it being accessed, cover with plastic wrap 30 g 1  . LORazepam (ATIVAN) 0.5 MG tablet Take 1 tablet (0.5 mg total) by mouth every 6 (six) hours as needed (Nausea or vomiting). 30 tablet 0  . Magnesium Hydroxide (MILK OF MAGNESIA PO) Take 30 mLs by mouth at bedtime.     . ondansetron (  ZOFRAN) 8 MG tablet Take 1 tablet (8 mg total) by mouth 2 (two) times daily as needed for refractory nausea / vomiting. Start on day 3 after chemotherapy. 30 tablet 1  . polyethylene glycol (MIRALAX / GLYCOLAX) packet Take by mouth.    . busPIRone (BUSPAR) 7.5 MG tablet Take 7.5 mg by mouth.    . oxyCODONE (OXY IR/ROXICODONE) 5 MG immediate release tablet Take 1 tablet (5 mg total) by mouth every 4 (four) hours as needed for severe pain. (Patient not taking: Reported on 03/04/2017) 30 tablet 0   No current facility-administered medications for this visit.     Review of Systems:  GENERAL:  Feels "ok".  No fevers or sweats.  Weight up 4 pounds. PERFORMANCE STATUS (ECOG):   1 HEENT:  No visual changes, runny nose, sore throat, mouth sores or tenderness. Lungs: No shortness of breath or cough.  No hemoptysis. Cardiac:  No chest pain, palpitations, orthopnea, or PND.  Elevated BP due to anxiety. GI:  Constipation relieved with MOM.  Rectal pain.  No bleeding.  No nausea off oxycodone.  No vomiting, diarrhea, or melena. GU:  No urgency, frequency, dysuria, or hematuria. Musculoskeletal:  No back pain.  No joint pain.  No muscle tenderness. Extremities:  No pain or swelling. Skin:  No rashes or skin changes. Neuro:  No headache, numbness or weakness, balance or coordination issues. Endocrine:  No diabetes, thyroid issues, hot flashes or night sweats. Psych:  Stress/anxiety.  No depression. Pain: Right lower rib pain (6 out of 10). Review of systems:  All other systems reviewed and found to be negative.  Physical Exam: Blood pressure (!) 146/78, pulse 70, temperature (!) 96.4 F (35.8 C), temperature source Tympanic, resp. rate 18, weight 182 lb 2 oz (82.6 kg). GENERAL:  Well developed, well nourished, woman sitting in the oncology clinic in no acute distress.  MENTAL STATUS:  Alert and oriented to person, place and time. HEAD:Long grayhair in a pony tail. Normocephalic, atraumatic, face symmetric, no Cushingoid features. EYES:Blueeyes. Pupils equal round and reactive to light and accomodation. No conjunctivitis or scleral icterus. JXB:JYNWGNFAOZ clear without lesion. Tonguenormal. Mucous membranes moist. RESPIRATORY:Clear to auscultationwithout rales, wheezes or rhonchi. CARDIOVASCULAR:Regular rate andrhythmwithout murmur, rub or gallop. ABDOMEN:Soft, non-tender with active bowel soundsand no hepatosplenomegaly. No masses. SKIN: No rashes, ulcers or lesions. EXTREMITIES: No edema, no skin discoloration or tenderness. No palpable cords. LYMPHNODES: No palpable cervical, supraclavicular, axillary or inguinal adenopathy  NEUROLOGICAL:  Unremarkable. PSYCH: Appropriate.   Appointment on 03/04/2017  Component Date Value Ref Range Status  . WBC 03/04/2017 4.9  3.6 - 11.0 K/uL Final  . RBC 03/04/2017 3.97  3.80 - 5.20 MIL/uL Final  . Hemoglobin 03/04/2017 12.2  12.0 - 16.0 g/dL Final  . HCT 03/04/2017 35.1  35.0 - 47.0 % Final  . MCV 03/04/2017 88.5  80.0 - 100.0 fL Final  . MCH 03/04/2017 30.9  26.0 - 34.0 pg Final  . MCHC 03/04/2017 34.9  32.0 - 36.0 g/dL Final  . RDW 03/04/2017 13.4  11.5 - 14.5 % Final  . Platelets 03/04/2017 223  150 - 440 K/uL Final  . Neutrophils Relative % 03/04/2017 55  % Final  . Neutro Abs 03/04/2017 2.7  1.4 - 6.5 K/uL Final  . Lymphocytes Relative 03/04/2017 34  % Final  . Lymphs Abs 03/04/2017 1.7  1.0 - 3.6 K/uL Final  . Monocytes Relative 03/04/2017 9  % Final  . Monocytes Absolute 03/04/2017 0.4  0.2 - 0.9 K/uL Final  .  Eosinophils Relative 03/04/2017 2  % Final  . Eosinophils Absolute 03/04/2017 0.1  0 - 0.7 K/uL Final  . Basophils Relative 03/04/2017 0  % Final  . Basophils Absolute 03/04/2017 0.0  0 - 0.1 K/uL Final  . Sodium 03/04/2017 136  135 - 145 mmol/L Final  . Potassium 03/04/2017 4.0  3.5 - 5.1 mmol/L Final  . Chloride 03/04/2017 103  101 - 111 mmol/L Final  . CO2 03/04/2017 28  22 - 32 mmol/L Final  . Glucose, Bld 03/04/2017 103* 65 - 99 mg/dL Final  . BUN 03/04/2017 13  6 - 20 mg/dL Final  . Creatinine, Ser 03/04/2017 1.00  0.44 - 1.00 mg/dL Final  . Calcium 03/04/2017 9.2  8.9 - 10.3 mg/dL Final  . Total Protein 03/04/2017 7.0  6.5 - 8.1 g/dL Final  . Albumin 03/04/2017 3.8  3.5 - 5.0 g/dL Final  . AST 03/04/2017 20  15 - 41 U/L Final  . ALT 03/04/2017 13* 14 - 54 U/L Final  . Alkaline Phosphatase 03/04/2017 73  38 - 126 U/L Final  . Total Bilirubin 03/04/2017 0.3  0.3 - 1.2 mg/dL Final  . GFR calc non Af Amer 03/04/2017 60* >60 mL/min Final  . GFR calc Af Amer 03/04/2017 >60  >60 mL/min Final   Comment: (NOTE) The eGFR has been calculated using the CKD EPI  equation. This calculation has not been validated in all clinical situations. eGFR's persistently <60 mL/min signify possible Chronic Kidney Disease.   . Anion gap 03/04/2017 5  5 - 15 Final  . Magnesium 03/04/2017 2.0  1.7 - 2.4 mg/dL Final    Assessment:  ALEYSSA PIKE is a 61 y.o. female with clinical stage T3N1M1 rectal cancer.   She presented with a 9 month history of rectal bleeding.  Colonoscopy on 01/14/2017 revealed a frond-like/villous non-obstructing large mass in the rectum. The mass was non-circumferential.  Biopsies revealed high grade dysplasia within an adenoma (? sampling error).  CEA was 31.6 on 01/15/2017.  Abdomen and pelvic CT on 01/13/2017 revealed irregular thickening of the rectum suspicious for carcinoma.  There was a 1.6 cm hypoattenuating lesion in the right hepatic lobe worrisome for metastatic disease.  Chest CT on 01/15/2017 revealed a 9 mm noncalcified left lower lobe pulmonary nodule (new).  Pelvic MRI on 01/16/2017 revealed rectal adenocarcinoma T3N1.  There was extension beyond the muscularis propria (17 mm).  There were mesorectal nodes >= 5 mm.  There was adenopathy on the left side of the mesorectum, including a 9 mm lesion.  The distance from the tumor to the anal sphincter was 8 mm.  PET scan on 01/27/2017 revealed intense metabolic activity associated rectal mass consists with primary rectal carcinoma.  There was a hypermetabolic metastatic lesion to the central LEFT hepatic lobe.  There was metabolic activity associated with small LEFT lower lobe nodule which was most consistent with pulmonary metastasis.  Liver MRI on 01/31/2017 revealed a 2.6 x 2.7 x 2.1 cm lesion in segment VIII of the liver near the junction with segment IVa c/w a metastatic lesion.  There was a 1.1 x 0.8 cm suspected metastatic lesion peripherally in segment VI of the liver.  CT guided liver biopsy at [facility] on 02/07/2017 revealed malignant cells c/w metastatic colorectal adenocarcinoma.   Tumor was sent for  MMR/MSI, KRAS/NRAS, and BRAF.  She is scheduled to have liver directed therapy (ablation) at [facility] on 03/19/2017.  CEA has been followed: 31.6 on 01/15/2017 and 38.1 on 02/18/2017.  She is s/p cycle #1 FOLFOX chemotherapy (02/18/2017).  She developed transient cold induced neuropathy.  Constipation is managed with milk of magnesia.  She has an 80 pack year smoking history.  Symptomatically, she has chronic rectal pain.  She has right upper quadrant/rib discomfort.  Pain is managed well with hydrocodone.  Exam is stable.  Magnesium is 2.0.  Plan: 1.  Labs today: CBC with diff, CMP, Mg, CEA. 2.  Cycle #2 FOLFOX chemotherapy today. 3.  RTC in 2 days for disconnect. 4.  RTC in 2 weeks for MD assessment, labs (CBC with diff, CMP, Mg, CEA), and cycle #3 FOLFOX chemotherapy.  Addendum:  At [facility], patient developed acute shortness of breath.  She had stopped by the restroom on her way out the infusion center.  She was wheezing and in respiratory distress.  She was treated with oxygen, Benadryl, hydrocortisone, and an albuterol nebulized treatment.  She was monitored until her symptoms resolved.   Lequita Asal, MD  03/04/2017, 10:02 AM

## 2017-03-04 ENCOUNTER — Encounter: Payer: Self-pay | Admitting: Hematology and Oncology

## 2017-03-04 ENCOUNTER — Inpatient Hospital Stay (HOSPITAL_BASED_OUTPATIENT_CLINIC_OR_DEPARTMENT_OTHER): Payer: Managed Care, Other (non HMO) | Admitting: Hematology and Oncology

## 2017-03-04 ENCOUNTER — Other Ambulatory Visit: Payer: Self-pay | Admitting: Hematology and Oncology

## 2017-03-04 ENCOUNTER — Inpatient Hospital Stay: Payer: Managed Care, Other (non HMO)

## 2017-03-04 VITALS — BP 146/78 | HR 70 | Temp 96.4°F | Resp 18 | Wt 182.1 lb

## 2017-03-04 DIAGNOSIS — G629 Polyneuropathy, unspecified: Secondary | ICD-10-CM | POA: Diagnosis not present

## 2017-03-04 DIAGNOSIS — I10 Essential (primary) hypertension: Secondary | ICD-10-CM | POA: Diagnosis not present

## 2017-03-04 DIAGNOSIS — C20 Malignant neoplasm of rectum: Secondary | ICD-10-CM

## 2017-03-04 DIAGNOSIS — C787 Secondary malignant neoplasm of liver and intrahepatic bile duct: Secondary | ICD-10-CM | POA: Diagnosis not present

## 2017-03-04 DIAGNOSIS — F17210 Nicotine dependence, cigarettes, uncomplicated: Secondary | ICD-10-CM | POA: Diagnosis not present

## 2017-03-04 DIAGNOSIS — R109 Unspecified abdominal pain: Secondary | ICD-10-CM | POA: Diagnosis not present

## 2017-03-04 DIAGNOSIS — K5900 Constipation, unspecified: Secondary | ICD-10-CM

## 2017-03-04 DIAGNOSIS — Z808 Family history of malignant neoplasm of other organs or systems: Secondary | ICD-10-CM

## 2017-03-04 DIAGNOSIS — Z801 Family history of malignant neoplasm of trachea, bronchus and lung: Secondary | ICD-10-CM

## 2017-03-04 DIAGNOSIS — Z79899 Other long term (current) drug therapy: Secondary | ICD-10-CM

## 2017-03-04 DIAGNOSIS — G893 Neoplasm related pain (acute) (chronic): Secondary | ICD-10-CM

## 2017-03-04 DIAGNOSIS — Z8701 Personal history of pneumonia (recurrent): Secondary | ICD-10-CM

## 2017-03-04 DIAGNOSIS — Z5111 Encounter for antineoplastic chemotherapy: Secondary | ICD-10-CM

## 2017-03-04 DIAGNOSIS — Z7189 Other specified counseling: Secondary | ICD-10-CM

## 2017-03-04 LAB — COMPREHENSIVE METABOLIC PANEL
ALT: 13 U/L — ABNORMAL LOW (ref 14–54)
AST: 20 U/L (ref 15–41)
Albumin: 3.8 g/dL (ref 3.5–5.0)
Alkaline Phosphatase: 73 U/L (ref 38–126)
Anion gap: 5 (ref 5–15)
BUN: 13 mg/dL (ref 6–20)
CO2: 28 mmol/L (ref 22–32)
Calcium: 9.2 mg/dL (ref 8.9–10.3)
Chloride: 103 mmol/L (ref 101–111)
Creatinine, Ser: 1 mg/dL (ref 0.44–1.00)
GFR calc Af Amer: >60 mL/min (ref >60)
GFR calc non Af Amer: 60 mL/min — ABNORMAL LOW (ref >60)
Glucose, Bld: 103 mg/dL — ABNORMAL HIGH (ref 65–99)
Potassium: 4 mmol/L (ref 3.5–5.1)
Sodium: 136 mmol/L (ref 135–145)
Total Bilirubin: 0.3 mg/dL (ref 0.3–1.2)
Total Protein: 7 g/dL (ref 6.5–8.1)

## 2017-03-04 LAB — CBC WITH DIFFERENTIAL/PLATELET
Basophils Absolute: 0 10*3/uL (ref 0–0.1)
Basophils Relative: 0 %
Eosinophils Absolute: 0.1 10*3/uL (ref 0–0.7)
Eosinophils Relative: 2 %
HCT: 35.1 % (ref 35.0–47.0)
Hemoglobin: 12.2 g/dL (ref 12.0–16.0)
Lymphocytes Relative: 34 %
Lymphs Abs: 1.7 10*3/uL (ref 1.0–3.6)
MCH: 30.9 pg (ref 26.0–34.0)
MCHC: 34.9 g/dL (ref 32.0–36.0)
MCV: 88.5 fL (ref 80.0–100.0)
Monocytes Absolute: 0.4 10*3/uL (ref 0.2–0.9)
Monocytes Relative: 9 %
Neutro Abs: 2.7 10*3/uL (ref 1.4–6.5)
Neutrophils Relative %: 55 %
Platelets: 223 10*3/uL (ref 150–440)
RBC: 3.97 MIL/uL (ref 3.80–5.20)
RDW: 13.4 % (ref 11.5–14.5)
WBC: 4.9 10*3/uL (ref 3.6–11.0)

## 2017-03-04 LAB — MAGNESIUM: Magnesium: 2 mg/dL (ref 1.7–2.4)

## 2017-03-04 MED ORDER — DEXAMETHASONE SODIUM PHOSPHATE 10 MG/ML IJ SOLN
10.0000 mg | Freq: Once | INTRAMUSCULAR | Status: AC
  Administered 2017-03-04: 10 mg via INTRAVENOUS
  Filled 2017-03-04: qty 3
  Filled 2017-03-04: qty 1

## 2017-03-04 MED ORDER — PALONOSETRON HCL INJECTION 0.25 MG/5ML
0.2500 mg | Freq: Once | INTRAVENOUS | Status: AC
  Administered 2017-03-04: 0.25 mg via INTRAVENOUS
  Filled 2017-03-04: qty 5

## 2017-03-04 MED ORDER — ALBUTEROL SULFATE (2.5 MG/3ML) 0.083% IN NEBU
2.5000 mg | INHALATION_SOLUTION | Freq: Once | RESPIRATORY_TRACT | Status: AC
  Administered 2017-03-04: 2.5 mg via RESPIRATORY_TRACT

## 2017-03-04 MED ORDER — FLUOROURACIL CHEMO INJECTION 2.5 GM/50ML
400.0000 mg/m2 | Freq: Once | INTRAVENOUS | Status: AC
  Administered 2017-03-04: 800 mg via INTRAVENOUS
  Filled 2017-03-04: qty 16

## 2017-03-04 MED ORDER — OXALIPLATIN CHEMO INJECTION 100 MG/20ML
85.0000 mg/m2 | Freq: Once | INTRAVENOUS | Status: AC
  Administered 2017-03-04: 170 mg via INTRAVENOUS
  Filled 2017-03-04: qty 34

## 2017-03-04 MED ORDER — ALBUTEROL SULFATE (2.5 MG/3ML) 0.083% IN NEBU
INHALATION_SOLUTION | RESPIRATORY_TRACT | Status: AC
  Filled 2017-03-04: qty 3

## 2017-03-04 MED ORDER — DEXTROSE 5 % IV SOLN
Freq: Once | INTRAVENOUS | Status: AC
  Administered 2017-03-04: 10:00:00 via INTRAVENOUS
  Filled 2017-03-04: qty 1000

## 2017-03-04 MED ORDER — FLUOROURACIL CHEMO INJECTION 5 GM/100ML
2400.0000 mg/m2 | INTRAVENOUS | Status: DC
  Administered 2017-03-04: 4750 mg via INTRAVENOUS
  Filled 2017-03-04: qty 95

## 2017-03-04 MED ORDER — LEUCOVORIN CALCIUM INJECTION 350 MG
800.0000 mg | Freq: Once | INTRAVENOUS | Status: AC
  Administered 2017-03-04: 800 mg via INTRAVENOUS
  Filled 2017-03-04: qty 25

## 2017-03-04 MED ORDER — SODIUM CHLORIDE 0.9 % IV SOLN: 10.0000 mg | Freq: Once | INTRAVENOUS | Status: DC

## 2017-03-04 MED ORDER — HYDROCORTISONE NA SUCCINATE PF 100 MG IJ SOLR
200.0000 mg | Freq: Once | INTRAMUSCULAR | Status: AC
  Administered 2017-03-04: 200 mg via INTRAVENOUS

## 2017-03-04 MED ORDER — DIPHENHYDRAMINE HCL 50 MG/ML IJ SOLN
25.0000 mg | Freq: Once | INTRAMUSCULAR | Status: AC
  Administered 2017-03-04: 25 mg via INTRAVENOUS

## 2017-03-04 NOTE — Progress Notes (Signed)
Pt had finished treatment and left to go home.  She stopped by the restroom on the way out when she started complaining of feeling not being able to breathe.  Patient started on O2, Dr. Mike Gip notified, Benadryl and Solu-Cortef given per orders. Albuterol nebulizer also given per Dr. Mike Gip.  Pt reported improvement and was monitored until symptoms resolved.

## 2017-03-04 NOTE — Progress Notes (Signed)
Patient states she is having pain in right upper quadrant (liver area). Otherwise, no complaints.  She has stopped Buspar and Oxycodone.  States she is feeling much better.

## 2017-03-04 NOTE — Addendum Note (Signed)
Addended by: Lorrine Kin A on: 03/04/2017 02:13 PM   Modules accepted: Orders

## 2017-03-05 LAB — CEA: CEA: 28.1 ng/mL — ABNORMAL HIGH (ref 0.0–4.7)

## 2017-03-06 ENCOUNTER — Inpatient Hospital Stay: Payer: Managed Care, Other (non HMO)

## 2017-03-06 ENCOUNTER — Other Ambulatory Visit: Payer: Self-pay | Admitting: *Deleted

## 2017-03-06 DIAGNOSIS — C787 Secondary malignant neoplasm of liver and intrahepatic bile duct: Secondary | ICD-10-CM

## 2017-03-06 DIAGNOSIS — C20 Malignant neoplasm of rectum: Secondary | ICD-10-CM

## 2017-03-06 LAB — BASIC METABOLIC PANEL
Anion gap: 7 (ref 5–15)
BUN: 23 mg/dL — ABNORMAL HIGH (ref 6–20)
CO2: 25 mmol/L (ref 22–32)
Calcium: 9 mg/dL (ref 8.9–10.3)
Chloride: 104 mmol/L (ref 101–111)
Creatinine, Ser: 0.91 mg/dL (ref 0.44–1.00)
GFR calc Af Amer: >60 mL/min (ref >60)
GFR calc non Af Amer: >60 mL/min (ref >60)
Glucose, Bld: 107 mg/dL — ABNORMAL HIGH (ref 65–99)
Potassium: 4 mmol/L (ref 3.5–5.1)
Sodium: 136 mmol/L (ref 135–145)

## 2017-03-06 LAB — MAGNESIUM: Magnesium: 2.1 mg/dL (ref 1.7–2.4)

## 2017-03-06 LAB — ALBUMIN: Albumin: 4 g/dL (ref 3.5–5.0)

## 2017-03-06 MED ORDER — SODIUM CHLORIDE 0.9% FLUSH
10.0000 mL | INTRAVENOUS | Status: DC | PRN
  Administered 2017-03-06: 10 mL
  Filled 2017-03-06: qty 10

## 2017-03-06 MED ORDER — SODIUM CHLORIDE 0.9 % IV SOLN
Freq: Once | INTRAVENOUS | Status: AC
  Administered 2017-03-06: 500 mL via INTRAVENOUS
  Filled 2017-03-06: qty 1000

## 2017-03-06 MED ORDER — HEPARIN SOD (PORK) LOCK FLUSH 100 UNIT/ML IV SOLN
500.0000 [IU] | Freq: Once | INTRAVENOUS | Status: AC | PRN
  Administered 2017-03-06: 500 [IU]
  Filled 2017-03-06: qty 5

## 2017-03-17 ENCOUNTER — Other Ambulatory Visit: Payer: Self-pay | Admitting: Hematology and Oncology

## 2017-03-17 DIAGNOSIS — C20 Malignant neoplasm of rectum: Secondary | ICD-10-CM

## 2017-03-18 ENCOUNTER — Inpatient Hospital Stay: Payer: Managed Care, Other (non HMO) | Attending: Hematology and Oncology

## 2017-03-18 ENCOUNTER — Inpatient Hospital Stay: Payer: Managed Care, Other (non HMO)

## 2017-03-18 ENCOUNTER — Inpatient Hospital Stay (HOSPITAL_BASED_OUTPATIENT_CLINIC_OR_DEPARTMENT_OTHER): Payer: Managed Care, Other (non HMO) | Admitting: Hematology and Oncology

## 2017-03-18 VITALS — BP 156/73 | HR 76 | Temp 96.2°F | Resp 18 | Wt 180.6 lb

## 2017-03-18 DIAGNOSIS — C20 Malignant neoplasm of rectum: Secondary | ICD-10-CM

## 2017-03-18 DIAGNOSIS — G629 Polyneuropathy, unspecified: Secondary | ICD-10-CM

## 2017-03-18 DIAGNOSIS — Z79899 Other long term (current) drug therapy: Secondary | ICD-10-CM | POA: Insufficient documentation

## 2017-03-18 DIAGNOSIS — Z7189 Other specified counseling: Secondary | ICD-10-CM

## 2017-03-18 DIAGNOSIS — Z8701 Personal history of pneumonia (recurrent): Secondary | ICD-10-CM | POA: Insufficient documentation

## 2017-03-18 DIAGNOSIS — Z5111 Encounter for antineoplastic chemotherapy: Secondary | ICD-10-CM | POA: Insufficient documentation

## 2017-03-18 DIAGNOSIS — I10 Essential (primary) hypertension: Secondary | ICD-10-CM

## 2017-03-18 DIAGNOSIS — Z7689 Persons encountering health services in other specified circumstances: Secondary | ICD-10-CM | POA: Insufficient documentation

## 2017-03-18 DIAGNOSIS — Z801 Family history of malignant neoplasm of trachea, bronchus and lung: Secondary | ICD-10-CM

## 2017-03-18 DIAGNOSIS — G893 Neoplasm related pain (acute) (chronic): Secondary | ICD-10-CM

## 2017-03-18 DIAGNOSIS — Z808 Family history of malignant neoplasm of other organs or systems: Secondary | ICD-10-CM | POA: Insufficient documentation

## 2017-03-18 DIAGNOSIS — R197 Diarrhea, unspecified: Secondary | ICD-10-CM | POA: Insufficient documentation

## 2017-03-18 DIAGNOSIS — F17210 Nicotine dependence, cigarettes, uncomplicated: Secondary | ICD-10-CM | POA: Insufficient documentation

## 2017-03-18 DIAGNOSIS — C787 Secondary malignant neoplasm of liver and intrahepatic bile duct: Secondary | ICD-10-CM | POA: Insufficient documentation

## 2017-03-18 LAB — CBC WITH DIFFERENTIAL/PLATELET
Basophils Absolute: 0 10*3/uL (ref 0–0.1)
Basophils Relative: 0 %
Eosinophils Absolute: 0.1 10*3/uL (ref 0–0.7)
Eosinophils Relative: 2 %
HCT: 34.7 % — ABNORMAL LOW (ref 35.0–47.0)
Hemoglobin: 12.2 g/dL (ref 12.0–16.0)
Lymphocytes Relative: 35 %
Lymphs Abs: 1.4 10*3/uL (ref 1.0–3.6)
MCH: 31 pg (ref 26.0–34.0)
MCHC: 35.2 g/dL (ref 32.0–36.0)
MCV: 88.2 fL (ref 80.0–100.0)
Monocytes Absolute: 0.4 10*3/uL (ref 0.2–0.9)
Monocytes Relative: 9 %
Neutro Abs: 2.1 10*3/uL (ref 1.4–6.5)
Neutrophils Relative %: 54 %
Platelets: 151 10*3/uL (ref 150–440)
RBC: 3.94 MIL/uL (ref 3.80–5.20)
RDW: 13.8 % (ref 11.5–14.5)
WBC: 4 10*3/uL (ref 3.6–11.0)

## 2017-03-18 LAB — COMPREHENSIVE METABOLIC PANEL
ALT: 14 U/L (ref 14–54)
AST: 24 U/L (ref 15–41)
Albumin: 3.8 g/dL (ref 3.5–5.0)
Alkaline Phosphatase: 68 U/L (ref 38–126)
Anion gap: 7 (ref 5–15)
BUN: 10 mg/dL (ref 6–20)
CO2: 26 mmol/L (ref 22–32)
Calcium: 9 mg/dL (ref 8.9–10.3)
Chloride: 106 mmol/L (ref 101–111)
Creatinine, Ser: 0.79 mg/dL (ref 0.44–1.00)
GFR calc Af Amer: >60 mL/min (ref >60)
GFR calc non Af Amer: >60 mL/min (ref >60)
Glucose, Bld: 121 mg/dL — ABNORMAL HIGH (ref 65–99)
Potassium: 3.5 mmol/L (ref 3.5–5.1)
Sodium: 139 mmol/L (ref 135–145)
Total Bilirubin: 0.3 mg/dL (ref 0.3–1.2)
Total Protein: 6.4 g/dL — ABNORMAL LOW (ref 6.5–8.1)

## 2017-03-18 LAB — MAGNESIUM: Magnesium: 1.8 mg/dL (ref 1.7–2.4)

## 2017-03-18 MED ORDER — HEPARIN SOD (PORK) LOCK FLUSH 100 UNIT/ML IV SOLN
500.0000 [IU] | Freq: Once | INTRAVENOUS | Status: AC
  Administered 2017-03-18: 500 [IU] via INTRAVENOUS
  Filled 2017-03-18: qty 5

## 2017-03-18 MED ORDER — SODIUM CHLORIDE 0.9% FLUSH
10.0000 mL | INTRAVENOUS | Status: DC | PRN
  Administered 2017-03-18: 10 mL via INTRAVENOUS
  Filled 2017-03-18: qty 10

## 2017-03-18 MED ORDER — HYDROCODONE-ACETAMINOPHEN 5-325 MG PO TABS: 1.0000 | ORAL_TABLET | Freq: Four times a day (QID) | ORAL | 0 refills | Status: DC | PRN

## 2017-03-18 NOTE — Progress Notes (Signed)
[facility] day:  03/18/2017   Chief Complaint: Tamara Hall is a 61 y.o. female with stage IV rectal cancer who is seen for assessment prior to cycle #1 FOLFIRI chemotherapy.  HPI:  The patient was last seen in the medical oncology clinic on 03/04/2017.  At that time, she received cycle #2 FOLFOX chemotherapy.  At the end of her infusion, she experienced a significant reaction felt secondary to oxaliplatin.  She developed wheezing and hypoxia.  She was treated with oxygen by face mask, Benadryl, hydrocortisone, and nebulized albuterol.  She was able to complete the 46 hour 5FU infusional pump.  When she returned for disconnect in 46 hours, She noted significant neuropathy in her feet and cramping in her hands.  Electrolytes were unremarkable.  We discussed a switch in therapy to FOLFIRI secondary to her allergic reaction on 03/04/2017. She comments that the pain in her feet and cramps went away after 3-4 days up. She takes Zofran for nausea.  During the interim, she notes a diarrhea 3 times a day. She denies any hematochezia. She is not taking milk of magnesia. She states that her husband is in the emergency room with the flu. He also has diarrhea.   Past Medical History:  Diagnosis Date  . Cancer ([place])   . Hypertension   . Pneumonia    20+ years ago    Past Surgical History:  Procedure Laterality Date  . ABDOMINAL HYSTERECTOMY    . BREAST SURGERY    . COLONOSCOPY WITH PROPOFOL N/A 01/14/2017   Procedure: COLONOSCOPY WITH PROPOFOL;  Surgeon: Lucilla Lame, MD;  Location: [facility] ENDOSCOPY;  Service: Endoscopy;  Laterality: N/A;  . ELBOW SURGERY Right    Has had 4 surgeries on right elbow.  Marland Kitchen HAND SURGERY Right 2008  . JOINT REPLACEMENT    . PORTA CATH INSERTION N/A 01/29/2017   Procedure: Glori Luis Cath Insertion;  Surgeon: Algernon Huxley, MD;  Location: [facility];  Service: Cardiovascular;  Laterality: N/A;  . TONSILLECTOMY      Family  History  Problem Relation Age of Onset  . Lung cancer Mother   . Lung cancer Father   . Brain cancer Maternal Grandmother     Social History:  reports that she has been smoking Cigarettes.  She has a 80.00 pack-year smoking history. She has never used smokeless tobacco. She reports that she does not drink alcohol or use drugs.  She lives in [place].  She has 3 children (ages 85, 3, and 64).  She smokes 2 packs a day.  She started smoking at age 59.  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  [name] is her significant other.  She is alone today.  Allergies: No Known Allergies  Current Medications: Current Outpatient Prescriptions  Medication Sig Dispense Refill  . acetaminophen (TYLENOL) 325 MG tablet Take 2 tablets (650 mg total) by mouth every 6 (six) hours as needed for mild pain (or Fever >/= 101). (Patient taking differently: Take 650 mg by mouth 2 (two) times daily as needed for mild pain (or Fever >/= 101). )    . busPIRone (BUSPAR) 7.5 MG tablet Take 7.5 mg by mouth.    Marland Kitchen HYDROcodone-acetaminophen (NORCO/VICODIN) 5-325 MG tablet Take 1 tablet by mouth every 6 (six) hours as needed for moderate pain. 60 tablet 0  . Hydrocodone-Acetaminophen 7.5-300 MG TABS Take 1 tablet by mouth  every 6 (six) hours as needed (pain). 60 each 0  . lidocaine-prilocaine (EMLA) cream Apply 1 application topically as needed. Apply small amount to port site at least 1 hour prior to it being accessed, cover with plastic wrap 30 g 1  . LORazepam (ATIVAN) 0.5 MG tablet Take 0.5 mg by mouth as needed.    . ondansetron (ZOFRAN) 8 MG tablet Take 8 mg by mouth every 8 (eight) hours as needed.    . polyethylene glycol (MIRALAX / GLYCOLAX) packet Take by mouth.    . Magnesium Hydroxide (MILK OF MAGNESIA PO) Take 30 mLs by mouth at bedtime.     Marland Kitchen oxyCODONE (OXY IR/ROXICODONE) 5 MG immediate release tablet Take 1 tablet (5 mg total) by mouth every 4 (four) hours as  needed for severe pain. (Patient not taking: Reported on 03/04/2017) 30 tablet 0   No current facility-administered medications for this visit.    Facility-Administered Medications Ordered in Other Visits  Medication Dose Route Frequency Provider Last Rate Last Dose  . heparin lock flush 100 unit/mL  500 Units Intravenous Once Corcoran, Melissa C, MD      . sodium chloride flush (NS) 0.9 % injection 10 mL  10 mL Intravenous PRN Lequita Asal, MD   10 mL at 03/18/17 0916    Review of Systems:  GENERAL:  Fatigue.  No fevers or sweats.  Weight down 2 pounds. PERFORMANCE STATUS (ECOG):  1 HEENT:  No visual changes, runny nose, sore throat, mouth sores or tenderness. Lungs: No shortness of breath or cough.  No hemoptysis. Cardiac:  No chest pain, palpitations, orthopnea, or PND.  Elevated BP due to anxiety. GI:  Diarrhea (see HPI).  Rectal pain.  No bleeding.  Nausea with oxycodone.  No vomiting, diarrhea, or melena. GU:  No urgency, frequency, dysuria, or hematuria. Musculoskeletal:  No back pain.  No joint pain.  No muscle tenderness. Extremities:  No pain or swelling. Skin:  No rashes or skin changes. Neuro:  No headache, numbness or weakness, balance or coordination issues. Endocrine:  No diabetes, thyroid issues, hot flashes or night sweats. Psych:  Stress/anxiety.  No depression. Pain: Rectal pain (6 out of 10). Review of systems:  All other systems reviewed and found to be negative.  Physical Exam: Blood pressure (!) 156/73, pulse 76, temperature (!) 96.2 F (35.7 C), temperature source Tympanic, resp. rate 18, weight 180 lb 9 oz (81.9 kg). GENERAL:  Well developed, well nourished, woman sitting comfortably in the oncology clinic in no acute distress.   MENTAL STATUS:  Alert and oriented to person, place and time. HEAD:Long grayhair in a pony tail. Normocephalic, atraumatic, face symmetric, no Cushingoid features. EYES:Blueeyes. Pupils equal round and reactive to light  and accomodation. No conjunctivitis or scleral icterus. AQT:[id number] clear without lesion. Tonguenormal. Mucous membranes moist. RESPIRATORY:Clear to auscultationwithout rales, wheezes or rhonchi. CARDIOVASCULAR:Regular rate andrhythmwithout murmur, rub or gallop. ABDOMEN:Soft, non-tender with active bowel soundsand no hepatosplenomegaly. No masses. SKIN: No rashes, ulcers or lesions. EXTREMITIES: No edema, no skin discoloration or tenderness. No palpable cords. LYMPHNODES: No palpable cervical, supraclavicular, axillary or inguinal adenopathy  NEUROLOGICAL: Unremarkable. PSYCH: Appropriate.   Infusion on 03/18/2017  Component Date Value Ref Range Status  . WBC 03/18/2017 4.0  3.6 - 11.0 K/uL Final  . RBC 03/18/2017 3.94  3.80 - 5.20 MIL/uL Final  . Hemoglobin 03/18/2017 12.2  12.0 - 16.0 g/dL Final  . HCT 03/18/2017 34.7* 35.0 - 47.0 % Final  . MCV 03/18/2017 88.2  80.0 -  100.0 fL Final  . MCH 03/18/2017 31.0  26.0 - 34.0 pg Final  . MCHC 03/18/2017 35.2  32.0 - 36.0 g/dL Final  . RDW 03/18/2017 13.8  11.5 - 14.5 % Final  . Platelets 03/18/2017 151  150 - 440 K/uL Final  . Neutrophils Relative % 03/18/2017 54  % Final  . Neutro Abs 03/18/2017 2.1  1.4 - 6.5 K/uL Final  . Lymphocytes Relative 03/18/2017 35  % Final  . Lymphs Abs 03/18/2017 1.4  1.0 - 3.6 K/uL Final  . Monocytes Relative 03/18/2017 9  % Final  . Monocytes Absolute 03/18/2017 0.4  0.2 - 0.9 K/uL Final  . Eosinophils Relative 03/18/2017 2  % Final  . Eosinophils Absolute 03/18/2017 0.1  0 - 0.7 K/uL Final  . Basophils Relative 03/18/2017 0  % Final  . Basophils Absolute 03/18/2017 0.0  0 - 0.1 K/uL Final  . Sodium 03/18/2017 139  135 - 145 mmol/L Final  . Potassium 03/18/2017 3.5  3.5 - 5.1 mmol/L Final  . Chloride 03/18/2017 106  101 - 111 mmol/L Final  . CO2 03/18/2017 26  22 - 32 mmol/L Final  . Glucose, Bld 03/18/2017 121* 65 - 99 mg/dL Final  . BUN 03/18/2017 10  6 - 20 mg/dL Final  .  Creatinine, Ser 03/18/2017 0.79  0.44 - 1.00 mg/dL Final  . Calcium 03/18/2017 9.0  8.9 - 10.3 mg/dL Final  . Total Protein 03/18/2017 6.4* 6.5 - 8.1 g/dL Final  . Albumin 03/18/2017 3.8  3.5 - 5.0 g/dL Final  . AST 03/18/2017 24  15 - 41 U/L Final  . ALT 03/18/2017 14  14 - 54 U/L Final  . Alkaline Phosphatase 03/18/2017 68  38 - 126 U/L Final  . Total Bilirubin 03/18/2017 0.3  0.3 - 1.2 mg/dL Final  . GFR calc non Af Amer 03/18/2017 >60  >60 mL/min Final  . GFR calc Af Amer 03/18/2017 >60  >60 mL/min Final   Comment: (NOTE) The eGFR has been calculated using the CKD EPI equation. This calculation has not been validated in all clinical situations. eGFR's persistently <60 mL/min signify possible Chronic Kidney Disease.   . Anion gap 03/18/2017 7  5 - 15 Final  . Magnesium 03/18/2017 1.8  1.7 - 2.4 mg/dL Final    Assessment:  RISHA BARRETTA is a 61 y.o. female with clinical stage T3N1M1 rectal cancer.   She presented with a 9 month history of rectal bleeding.  Colonoscopy on 01/14/2017 revealed a frond-like/villous non-obstructing large mass in the rectum. The mass was non-circumferential.  Biopsies revealed high grade dysplasia within an adenoma (? sampling error).  CEA was 31.6 on 01/15/2017.  Abdomen and pelvic CT on 01/13/2017 revealed irregular thickening of the rectum suspicious for carcinoma.  There was a 1.6 cm hypoattenuating lesion in the right hepatic lobe worrisome for metastatic disease.  Chest CT on 01/15/2017 revealed a 9 mm noncalcified left lower lobe pulmonary nodule (new).  Pelvic MRI on 01/16/2017 revealed rectal adenocarcinoma T3N1.  There was extension beyond the muscularis propria (17 mm).  There were mesorectal nodes >= 5 mm.  There was adenopathy on the left side of the mesorectum, including a 9 mm lesion.  The distance from the tumor to the anal sphincter was 8 mm.  PET scan on 01/27/2017 revealed intense metabolic activity associated rectal mass consists with  primary rectal carcinoma.  There was a hypermetabolic metastatic lesion to the central LEFT hepatic lobe.  There was metabolic activity associated  with small LEFT lower lobe nodule which was most consistent with pulmonary metastasis.  Liver MRI on 01/31/2017 revealed a 2.6 x 2.7 x 2.1 cm lesion in segment VIII of the liver near the junction with segment IVa c/w a metastatic lesion.  There was a 1.1 x 0.8 cm suspected metastatic lesion peripherally in segment VI of the liver.  CT guided liver biopsy at [facility] on 02/07/2017 revealed malignant cells c/w metastatic colorectal adenocarcinoma.  Tumor was sent for  MMR/MSI, KRAS/NRAS, and BRAF.  She is scheduled to have liver directed therapy (ablation) at [facility] on 03/19/2017.  CEA has been followed: 31.6 on 01/15/2017, 38.1 on 02/18/2017, and 25.1 on 03/18/2017.  She is s/p 2 cycle of FOLFOX chemotherapy (02/18/2017 - 03/04/2017).  She developed transient cold induced neuropathy.  She developed wheezing and hypoxia with cycle #2 felt secondary to oxaliplatin.  She was treated with oxygen by face mask, Benadryl, hydrocortisone, and nebulized albuterol.    She has an 80 pack year smoking history.  Symptomatically, she has chronic rectal pain.  She has had diarrhea x 3 days.  Exam is stable.  Magnesium is 1.8.  Plan: 1.  Labs today: CBC with diff, CMP, Mg, CEA. 2.  Review switch in therapy.  Side effects reviewed.  Discuss use of Imodium. 3.  Postpone cycle #1 FOLFIRI chemotherapy today secondary to diarrhea. 4.  Stool studies today (C diff and PCR for pathogens). 5.  RTC in 1 week for MD assessment, labs (CBC with diff, CMP, Mg), and cycle #1 FOLFIRI chemotherapy.   Lequita Asal, MD  03/18/2017, 10:18 AM

## 2017-03-18 NOTE — Progress Notes (Signed)
Patient is tearful today.  She is very anxious since having a reaction last time.  She is requesting refill on Vicodin.  She has been out since Saturday.  Rectal pain today 6/10.  Patient does have some small raised areas on her chest.

## 2017-03-19 ENCOUNTER — Other Ambulatory Visit: Payer: Self-pay

## 2017-03-19 DIAGNOSIS — R197 Diarrhea, unspecified: Secondary | ICD-10-CM

## 2017-03-19 DIAGNOSIS — C20 Malignant neoplasm of rectum: Secondary | ICD-10-CM | POA: Diagnosis not present

## 2017-03-19 LAB — GASTROINTESTINAL PANEL BY PCR, STOOL (REPLACES STOOL CULTURE)

## 2017-03-19 LAB — C DIFFICILE QUICK SCREEN W PCR REFLEX
C Diff antigen: NEGATIVE
C Diff interpretation: NOT DETECTED
C Diff toxin: NEGATIVE

## 2017-03-19 LAB — CEA: CEA: 25.1 ng/mL — ABNORMAL HIGH (ref 0.0–4.7)

## 2017-03-20 ENCOUNTER — Inpatient Hospital Stay: Payer: Managed Care, Other (non HMO)

## 2017-03-20 ENCOUNTER — Telehealth: Payer: Self-pay | Admitting: *Deleted

## 2017-03-20 NOTE — Telephone Encounter (Signed)
Called patient and discussed that the studies were negative, it was ok to use imodium and to eat the BRAT diet and to avoid milk, voiced understanding.

## 2017-03-20 NOTE — Telephone Encounter (Signed)
Asking for results of her specimen and asking if she needs antibiotics. Please return her call as she was expecting a call from [place].

## 2017-03-20 NOTE — Telephone Encounter (Signed)
Done

## 2017-03-24 ENCOUNTER — Inpatient Hospital Stay (HOSPITAL_BASED_OUTPATIENT_CLINIC_OR_DEPARTMENT_OTHER): Payer: Managed Care, Other (non HMO) | Admitting: Hematology and Oncology

## 2017-03-24 ENCOUNTER — Inpatient Hospital Stay: Payer: Managed Care, Other (non HMO)

## 2017-03-24 ENCOUNTER — Other Ambulatory Visit: Payer: Self-pay | Admitting: Hematology and Oncology

## 2017-03-24 ENCOUNTER — Encounter: Payer: Self-pay | Admitting: Hematology and Oncology

## 2017-03-24 VITALS — BP 144/74 | HR 77 | Temp 98.3°F | Wt 180.4 lb

## 2017-03-24 DIAGNOSIS — C20 Malignant neoplasm of rectum: Secondary | ICD-10-CM

## 2017-03-24 DIAGNOSIS — G629 Polyneuropathy, unspecified: Secondary | ICD-10-CM | POA: Diagnosis not present

## 2017-03-24 DIAGNOSIS — Z8701 Personal history of pneumonia (recurrent): Secondary | ICD-10-CM | POA: Diagnosis not present

## 2017-03-24 DIAGNOSIS — Z801 Family history of malignant neoplasm of trachea, bronchus and lung: Secondary | ICD-10-CM

## 2017-03-24 DIAGNOSIS — Z79899 Other long term (current) drug therapy: Secondary | ICD-10-CM | POA: Diagnosis not present

## 2017-03-24 DIAGNOSIS — I10 Essential (primary) hypertension: Secondary | ICD-10-CM | POA: Diagnosis not present

## 2017-03-24 DIAGNOSIS — Z5111 Encounter for antineoplastic chemotherapy: Secondary | ICD-10-CM

## 2017-03-24 DIAGNOSIS — C787 Secondary malignant neoplasm of liver and intrahepatic bile duct: Secondary | ICD-10-CM

## 2017-03-24 DIAGNOSIS — F17210 Nicotine dependence, cigarettes, uncomplicated: Secondary | ICD-10-CM

## 2017-03-24 DIAGNOSIS — Z808 Family history of malignant neoplasm of other organs or systems: Secondary | ICD-10-CM | POA: Diagnosis not present

## 2017-03-24 LAB — COMPREHENSIVE METABOLIC PANEL
ALT: 23 U/L (ref 14–54)
AST: 33 U/L (ref 15–41)
Albumin: 4 g/dL (ref 3.5–5.0)
Alkaline Phosphatase: 80 U/L (ref 38–126)
Anion gap: 8 (ref 5–15)
BUN: 13 mg/dL (ref 6–20)
CO2: 27 mmol/L (ref 22–32)
Calcium: 9.1 mg/dL (ref 8.9–10.3)
Chloride: 100 mmol/L — ABNORMAL LOW (ref 101–111)
Creatinine, Ser: 0.7 mg/dL (ref 0.44–1.00)
GFR calc Af Amer: >60 mL/min (ref >60)
GFR calc non Af Amer: >60 mL/min (ref >60)
Glucose, Bld: 112 mg/dL — ABNORMAL HIGH (ref 65–99)
Potassium: 4.1 mmol/L (ref 3.5–5.1)
Sodium: 135 mmol/L (ref 135–145)
Total Bilirubin: 0.4 mg/dL (ref 0.3–1.2)
Total Protein: 6.9 g/dL (ref 6.5–8.1)

## 2017-03-24 LAB — CBC WITH DIFFERENTIAL/PLATELET
Basophils Absolute: 0 10*3/uL (ref 0–0.1)
Basophils Relative: 1 %
Eosinophils Absolute: 0.1 10*3/uL (ref 0–0.7)
Eosinophils Relative: 2 %
HCT: 37.2 % (ref 35.0–47.0)
Hemoglobin: 13 g/dL (ref 12.0–16.0)
Lymphocytes Relative: 42 %
Lymphs Abs: 1.5 10*3/uL (ref 1.0–3.6)
MCH: 30.9 pg (ref 26.0–34.0)
MCHC: 34.8 g/dL (ref 32.0–36.0)
MCV: 88.7 fL (ref 80.0–100.0)
Monocytes Absolute: 0.6 10*3/uL (ref 0.2–0.9)
Monocytes Relative: 17 %
Neutro Abs: 1.3 10*3/uL — ABNORMAL LOW (ref 1.4–6.5)
Neutrophils Relative %: 38 %
Platelets: 155 10*3/uL (ref 150–440)
RBC: 4.19 MIL/uL (ref 3.80–5.20)
RDW: 14.5 % (ref 11.5–14.5)
WBC: 3.5 10*3/uL — ABNORMAL LOW (ref 3.6–11.0)

## 2017-03-24 MED ORDER — LEUCOVORIN CALCIUM INJECTION 350 MG
800.0000 mg | Freq: Once | INTRAMUSCULAR | Status: AC
  Administered 2017-03-24: 800 mg via INTRAVENOUS
  Filled 2017-03-24: qty 17.5

## 2017-03-24 MED ORDER — DEXAMETHASONE SODIUM PHOSPHATE 100 MG/10ML IJ SOLN: 10.0000 mg | Freq: Once | INTRAMUSCULAR | Status: DC

## 2017-03-24 MED ORDER — HEPARIN SOD (PORK) LOCK FLUSH 100 UNIT/ML IV SOLN: 500.0000 [IU] | Freq: Once | INTRAVENOUS | Status: DC

## 2017-03-24 MED ORDER — PALONOSETRON HCL INJECTION 0.25 MG/5ML
0.2500 mg | Freq: Once | INTRAVENOUS | Status: AC
  Administered 2017-03-24: 0.25 mg via INTRAVENOUS
  Filled 2017-03-24: qty 5

## 2017-03-24 MED ORDER — ATROPINE SULFATE 1 MG/ML IJ SOLN
0.5000 mg | Freq: Once | INTRAMUSCULAR | Status: AC | PRN
  Administered 2017-03-24: 0.5 mg via INTRAVENOUS
  Filled 2017-03-24: qty 1

## 2017-03-24 MED ORDER — IRINOTECAN HCL CHEMO INJECTION 100 MG/5ML
180.0000 mg/m2 | Freq: Once | INTRAVENOUS | Status: AC
  Administered 2017-03-24: 360 mg via INTRAVENOUS
  Filled 2017-03-24: qty 15

## 2017-03-24 MED ORDER — FLUOROURACIL CHEMO INJECTION 2.5 GM/50ML
400.0000 mg/m2 | Freq: Once | INTRAVENOUS | Status: AC
  Administered 2017-03-24: 800 mg via INTRAVENOUS
  Filled 2017-03-24: qty 16

## 2017-03-24 MED ORDER — DEXAMETHASONE SODIUM PHOSPHATE 10 MG/ML IJ SOLN
10.0000 mg | Freq: Once | INTRAMUSCULAR | Status: AC
  Administered 2017-03-24: 10 mg via INTRAVENOUS
  Filled 2017-03-24: qty 1

## 2017-03-24 MED ORDER — SODIUM CHLORIDE 0.9 % IV SOLN
Freq: Once | INTRAVENOUS | Status: AC
  Administered 2017-03-24: 11:00:00 via INTRAVENOUS
  Filled 2017-03-24: qty 1000

## 2017-03-24 MED ORDER — SODIUM CHLORIDE 0.9 % IV SOLN
2400.0000 mg/m2 | INTRAVENOUS | Status: DC
  Administered 2017-03-24: 4750 mg via INTRAVENOUS
  Filled 2017-03-24: qty 95

## 2017-03-24 MED ORDER — SODIUM CHLORIDE 0.9% FLUSH
10.0000 mL | Freq: Once | INTRAVENOUS | Status: AC
  Administered 2017-03-24: 10 mL via INTRAVENOUS
  Filled 2017-03-24: qty 10

## 2017-03-24 NOTE — Progress Notes (Signed)
Patient states she completed the BRAT diet and diarrhea is resolved.  Also states her last prescription for Hydrocodone/Acetaminophen was for 5 mg not the original 7.5.  It is not covering pain as well.  Would like to go back to 7.5 mg.

## 2017-03-24 NOTE — Progress Notes (Signed)
[facility] day:  03/24/2017   Chief Complaint: Tamara Hall is a 61 y.o. female with stage IV rectal cancer who is seen for assessment prior to cycle #1 FOLFIRI chemotherapy.  HPI:  The patient was last seen in the medical oncology clinic on 03/24/2017.  At that time, chemotherapy was postponed secondary to diarrhea.  Stool studies were sent.  Stool was negative for C diff and the GI panel by PCR.  Her husband had diarrhea.  She was treated symptomatically.  During the interim, she has done well.  Diarrhea has resolved.  She denies any complaints.   Past Medical History:  Diagnosis Date  . Cancer ([place])   . Hypertension   . Pneumonia    20+ years ago    Past Surgical History:  Procedure Laterality Date  . ABDOMINAL HYSTERECTOMY    . BREAST SURGERY    . COLONOSCOPY WITH PROPOFOL N/A 01/14/2017   Procedure: COLONOSCOPY WITH PROPOFOL;  Surgeon: Lucilla Lame, MD;  Location: [facility] ENDOSCOPY;  Service: Endoscopy;  Laterality: N/A;  . ELBOW SURGERY Right    Has had 4 surgeries on right elbow.  Marland Kitchen HAND SURGERY Right 2008  . JOINT REPLACEMENT    . PORTA CATH INSERTION N/A 01/29/2017   Procedure: Glori Luis Cath Insertion;  Surgeon: Algernon Huxley, MD;  Location: [place] [facility];  Service: Cardiovascular;  Laterality: N/A;  . TONSILLECTOMY      Family History  Problem Relation Age of Onset  . Lung cancer Mother   . Lung cancer Father   . Brain cancer Maternal Grandmother     Social History:  reports that she has been smoking Cigarettes.  She has a 80.00 pack-year smoking history. She has never used smokeless tobacco. She reports that she does not drink alcohol or use drugs.  She lives in [place].  She has 3 children (ages 36, 49, and 19).  She smokes 2 packs a day.  She started smoking at age 56.  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  [name] is her significant other.  She is alone  today.  Allergies: No Known Allergies  Current Medications: Current Outpatient Prescriptions  Medication Sig Dispense Refill  . acetaminophen (TYLENOL) 325 MG tablet Take 2 tablets (650 mg total) by mouth every 6 (six) hours as needed for mild pain (or Fever >/= 101). (Patient taking differently: Take 650 mg by mouth 2 (two) times daily as needed for mild pain (or Fever >/= 101). )    . busPIRone (BUSPAR) 7.5 MG tablet Take 7.5 mg by mouth.    Marland Kitchen HYDROcodone-acetaminophen (NORCO/VICODIN) 5-325 MG tablet Take 1 tablet by mouth every 6 (six) hours as needed for moderate pain. 60 tablet 0  . Hydrocodone-Acetaminophen 7.5-300 MG TABS Take 1 tablet by mouth every 6 (six) hours as needed (pain). 60 each 0  . lidocaine-prilocaine (EMLA) cream Apply 1 application topically as needed. Apply small amount to port site at least 1 hour prior to it being accessed, cover with plastic wrap 30 g 1  . LORazepam (ATIVAN) 0.5 MG tablet Take 0.5 mg by mouth as needed.    . Magnesium Hydroxide (MILK OF MAGNESIA PO) Take 30 mLs by mouth at bedtime.     . ondansetron (ZOFRAN) 8 MG tablet Take 8 mg by mouth every 8 (eight) hours as needed.    . polyethylene glycol (MIRALAX / GLYCOLAX) packet  Take by mouth.    . oxyCODONE (OXY IR/ROXICODONE) 5 MG immediate release tablet Take 1 tablet (5 mg total) by mouth every 4 (four) hours as needed for severe pain. (Patient not taking: Reported on 03/04/2017) 30 tablet 0   No current facility-administered medications for this visit.    Facility-Administered Medications Ordered in Other Visits  Medication Dose Route Frequency Provider Last Rate Last Dose  . heparin lock flush 100 unit/mL  500 Units Intravenous Once Lequita Asal, MD        Review of Systems:  GENERAL:  Feels good.  No fevers or sweats.  Weight stable. PERFORMANCE STATUS (ECOG):  1 HEENT:  No visual changes, runny nose, sore throat, mouth sores or tenderness. Lungs: No shortness of breath or cough.  No  hemoptysis. Cardiac:  No chest pain, palpitations, orthopnea, or PND.  Elevated BP due to anxiety. GI:  Diarrhea, resolved.  Rectal pain (better controlled with hydrocodone 7.5).  No bleeding.  Nausea with oxycodone.  No vomiting, diarrhea, or melena. GU:  No urgency, frequency, dysuria, or hematuria. Musculoskeletal:  No back pain.  No joint pain.  No muscle tenderness. Extremities:  No pain or swelling. Skin:  No rashes or skin changes. Neuro:  No headache, numbness or weakness, balance or coordination issues. Endocrine:  No diabetes, thyroid issues, hot flashes or night sweats. Psych:  Stress/anxiety.  No depression. Pain: Rectal pain (5 out of 10). Review of systems:  All other systems reviewed and found to be negative.  Physical Exam: Blood pressure (!) 144/74, pulse 77, temperature 98.3 F (36.8 C), temperature source Tympanic, weight 180 lb 6 oz (81.8 kg). GENERAL:  Well developed, well nourished, woman sitting comfortably in the oncology clinic in no acute distress.   MENTAL STATUS:  Alert and oriented to person, place and time. HEAD:Long grayhair in a pony tail. Normocephalic, atraumatic, face symmetric, no Cushingoid features. EYES:Blueeyes. Pupils equal round and reactive to light and accomodation. No conjunctivitis or scleral icterus. DGL:OVFIEPPIRJ clear without lesion. Tonguenormal. Mucous membranes moist. RESPIRATORY:Clear to auscultationwithout rales, wheezes or rhonchi. CARDIOVASCULAR:Regular rate andrhythmwithout murmur, rub or gallop. ABDOMEN:Soft, non-tender with active bowel soundsand no hepatosplenomegaly. No masses. SKIN: No rashes, ulcers or lesions. EXTREMITIES: No edema, no skin discoloration or tenderness. No palpable cords. LYMPHNODES: No palpable cervical, supraclavicular, axillary or inguinal adenopathy  NEUROLOGICAL: Unremarkable. PSYCH: Appropriate.   Infusion on 03/24/2017  Component Date Value Ref Range Status  . WBC  03/24/2017 3.5* 3.6 - 11.0 K/uL Final  . RBC 03/24/2017 4.19  3.80 - 5.20 MIL/uL Final  . Hemoglobin 03/24/2017 13.0  12.0 - 16.0 g/dL Final  . HCT 03/24/2017 37.2  35.0 - 47.0 % Final  . MCV 03/24/2017 88.7  80.0 - 100.0 fL Final  . MCH 03/24/2017 30.9  26.0 - 34.0 pg Final  . MCHC 03/24/2017 34.8  32.0 - 36.0 g/dL Final  . RDW 03/24/2017 14.5  11.5 - 14.5 % Final  . Platelets 03/24/2017 155  150 - 440 K/uL Final  . Neutrophils Relative % 03/24/2017 38  % Final  . Neutro Abs 03/24/2017 1.3* 1.4 - 6.5 K/uL Final  . Lymphocytes Relative 03/24/2017 42  % Final  . Lymphs Abs 03/24/2017 1.5  1.0 - 3.6 K/uL Final  . Monocytes Relative 03/24/2017 17  % Final  . Monocytes Absolute 03/24/2017 0.6  0.2 - 0.9 K/uL Final  . Eosinophils Relative 03/24/2017 2  % Final  . Eosinophils Absolute 03/24/2017 0.1  0 - 0.7 K/uL Final  .  Basophils Relative 03/24/2017 1  % Final  . Basophils Absolute 03/24/2017 0.0  0 - 0.1 K/uL Final  . Sodium 03/24/2017 135  135 - 145 mmol/L Final  . Potassium 03/24/2017 4.1  3.5 - 5.1 mmol/L Final  . Chloride 03/24/2017 100* 101 - 111 mmol/L Final  . CO2 03/24/2017 27  22 - 32 mmol/L Final  . Glucose, Bld 03/24/2017 112* 65 - 99 mg/dL Final  . BUN 03/24/2017 13  6 - 20 mg/dL Final  . Creatinine, Ser 03/24/2017 0.70  0.44 - 1.00 mg/dL Final  . Calcium 03/24/2017 9.1  8.9 - 10.3 mg/dL Final  . Total Protein 03/24/2017 6.9  6.5 - 8.1 g/dL Final  . Albumin 03/24/2017 4.0  3.5 - 5.0 g/dL Final  . AST 03/24/2017 33  15 - 41 U/L Final  . ALT 03/24/2017 23  14 - 54 U/L Final  . Alkaline Phosphatase 03/24/2017 80  38 - 126 U/L Final  . Total Bilirubin 03/24/2017 0.4  0.3 - 1.2 mg/dL Final  . GFR calc non Af Amer 03/24/2017 >60  >60 mL/min Final  . GFR calc Af Amer 03/24/2017 >60  >60 mL/min Final   Comment: (NOTE) The eGFR has been calculated using the CKD EPI equation. This calculation has not been validated in all clinical situations. eGFR's persistently <60 mL/min  signify possible Chronic Kidney Disease.   . Anion gap 03/24/2017 8  5 - 15 Final    Assessment:  Tamara Hall is a 62 y.o. female with clinical stage T3N1M1 rectal cancer.   She presented with a 9 month history of rectal bleeding.  Colonoscopy on 01/14/2017 revealed a frond-like/villous non-obstructing large mass in the rectum. The mass was non-circumferential.  Biopsies revealed high grade dysplasia within an adenoma (? sampling error).  CEA was 31.6 on 01/15/2017.  Abdomen and pelvic CT on 01/13/2017 revealed irregular thickening of the rectum suspicious for carcinoma.  There was a 1.6 cm hypoattenuating lesion in the right hepatic lobe worrisome for metastatic disease.  Chest CT on 01/15/2017 revealed a 9 mm noncalcified left lower lobe pulmonary nodule (new).  Pelvic MRI on 01/16/2017 revealed rectal adenocarcinoma T3N1.  There was extension beyond the muscularis propria (17 mm).  There were mesorectal nodes >= 5 mm.  There was adenopathy on the left side of the mesorectum, including a 9 mm lesion.  The distance from the tumor to the anal sphincter was 8 mm.  PET scan on 01/27/2017 revealed intense metabolic activity associated rectal mass consists with primary rectal carcinoma.  There was a hypermetabolic metastatic lesion to the central LEFT hepatic lobe.  There was metabolic activity associated with small LEFT lower lobe nodule which was most consistent with pulmonary metastasis.  Liver MRI on 01/31/2017 revealed a 2.6 x 2.7 x 2.1 cm lesion in segment VIII of the liver near the junction with segment IVa c/w a metastatic lesion.  There was a 1.1 x 0.8 cm suspected metastatic lesion peripherally in segment VI of the liver.  CT guided liver [facility] at [facility] on 02/07/2017 revealed malignant cells c/w metastatic colorectal adenocarcinoma.  Tumor was sent for  MMR/MSI, KRAS/NRAS, and BRAF.  She is scheduled to have liver directed therapy (ablation) at [facility] on 03/19/2017.  CEA has been followed: 31.6  on 01/15/2017, 38.1 on 02/18/2017, and 25.1 on 03/18/2017.  She is s/p 2 cycle of FOLFOX chemotherapy (02/18/2017 - 03/04/2017).  She developed transient cold induced neuropathy.  She developed wheezing and hypoxia with cycle #2 felt  secondary to oxaliplatin.  She was treated with oxygen by face mask, Benadryl, hydrocortisone, and nebulized albuterol.    She has an 80 pack year smoking history.  Symptomatically, she has chronic rectal pain.  Diarrhea has resolved.  Exam is stable.  ANC is 1300.   Plan: 1.  Labs today: CBC with diff, CMP, Mg. 2.  Review potential side effects associated with FOLFIRI.  Discuss myelosuppression, hair loss, nausea, vomiting, and diarrhea. 3.  Cycle #1 FOLFIRI chemotherapy today. 4.  RTC in 2 days for disconnect. 5.  RTC in 1 week for labs (CBC with diff, BMP). 6.  RTC on 04/08/2017 for MD (Dr Janese Banks) assessment, labs (CBC with diff, CMP, Mg, CEA), and cycle #2 FOLFIRI chemotherapy.  Addendum: ANC is a little low (1300).  She has not had chemotherapy since 03/04/2017 (last cycle of FOLFOX).  Preauth Neulasta and give at disconnect.   Lequita Asal, MD  03/24/2017, 10:17 AM

## 2017-03-25 ENCOUNTER — Telehealth: Payer: Self-pay | Admitting: *Deleted

## 2017-03-25 NOTE — Telephone Encounter (Signed)
-----   Message from Lequita Asal, MD sent at 03/25/2017  6:18 AM EDT ----- Regarding: Please call patient  Confirm she know how to use Imodium if she has diarrhea and to call if diarrhea significant.  Also remind her that she will be receiving Neulasta at disconnect and for her to take Claritin for about a week to prevent Neulasta induced bone pain.  M

## 2017-03-25 NOTE — Telephone Encounter (Signed)
Called patient to go over how she is to take her imodium and that she is to call if she has significant diarrhea.  Also advised her to take Claritin for a week to help with bone pain from Neulasta.  Patient verbalized understanding.

## 2017-03-26 ENCOUNTER — Inpatient Hospital Stay: Payer: Managed Care, Other (non HMO)

## 2017-03-26 VITALS — BP 158/78 | HR 70

## 2017-03-26 DIAGNOSIS — C20 Malignant neoplasm of rectum: Secondary | ICD-10-CM

## 2017-03-26 MED ORDER — HEPARIN SOD (PORK) LOCK FLUSH 100 UNIT/ML IV SOLN
INTRAVENOUS | Status: AC
  Filled 2017-03-26: qty 5

## 2017-03-26 MED ORDER — HEPARIN SOD (PORK) LOCK FLUSH 100 UNIT/ML IV SOLN
500.0000 [IU] | Freq: Once | INTRAVENOUS | Status: AC | PRN
  Administered 2017-03-26: 500 [IU]

## 2017-03-26 MED ORDER — PEGFILGRASTIM INJECTION 6 MG/0.6ML ~~LOC~~
6.0000 mg | PREFILLED_SYRINGE | Freq: Once | SUBCUTANEOUS | Status: AC
  Administered 2017-03-26: 6 mg via SUBCUTANEOUS
  Filled 2017-03-26: qty 0.6

## 2017-03-29 ENCOUNTER — Other Ambulatory Visit: Payer: Self-pay | Admitting: Hematology and Oncology

## 2017-03-31 ENCOUNTER — Inpatient Hospital Stay: Payer: Managed Care, Other (non HMO)

## 2017-04-01 ENCOUNTER — Other Ambulatory Visit: Payer: Self-pay | Admitting: *Deleted

## 2017-04-01 MED ORDER — HYDROCODONE-ACETAMINOPHEN 7.5-300 MG PO TABS: 1.0000 | ORAL_TABLET | Freq: Four times a day (QID) | ORAL | 0 refills | Status: DC | PRN

## 2017-04-02 ENCOUNTER — Inpatient Hospital Stay: Payer: Managed Care, Other (non HMO)

## 2017-04-02 DIAGNOSIS — C20 Malignant neoplasm of rectum: Secondary | ICD-10-CM

## 2017-04-02 DIAGNOSIS — Z5111 Encounter for antineoplastic chemotherapy: Secondary | ICD-10-CM

## 2017-04-02 DIAGNOSIS — C787 Secondary malignant neoplasm of liver and intrahepatic bile duct: Secondary | ICD-10-CM

## 2017-04-02 LAB — CBC WITH DIFFERENTIAL/PLATELET
Basophils Absolute: 0 10*3/uL (ref 0–0.1)
Basophils Relative: 0 %
Eosinophils Absolute: 0.1 10*3/uL (ref 0–0.7)
Eosinophils Relative: 1 %
HCT: 36.4 % (ref 35.0–47.0)
Hemoglobin: 12.7 g/dL (ref 12.0–16.0)
Lymphocytes Relative: 26 %
Lymphs Abs: 2.1 10*3/uL (ref 1.0–3.6)
MCH: 30.8 pg (ref 26.0–34.0)
MCHC: 34.9 g/dL (ref 32.0–36.0)
MCV: 88.2 fL (ref 80.0–100.0)
Monocytes Absolute: 0.5 10*3/uL (ref 0.2–0.9)
Monocytes Relative: 6 %
Neutro Abs: 5.3 10*3/uL (ref 1.4–6.5)
Neutrophils Relative %: 67 %
Platelets: 143 10*3/uL — ABNORMAL LOW (ref 150–440)
RBC: 4.13 MIL/uL (ref 3.80–5.20)
RDW: 14.8 % — ABNORMAL HIGH (ref 11.5–14.5)
WBC: 8 10*3/uL (ref 3.6–11.0)

## 2017-04-02 LAB — BASIC METABOLIC PANEL
Anion gap: 6 (ref 5–15)
BUN: 13 mg/dL (ref 6–20)
CO2: 26 mmol/L (ref 22–32)
Calcium: 9.2 mg/dL (ref 8.9–10.3)
Chloride: 105 mmol/L (ref 101–111)
Creatinine, Ser: 0.83 mg/dL (ref 0.44–1.00)
GFR calc Af Amer: >60 mL/min (ref >60)
GFR calc non Af Amer: >60 mL/min (ref >60)
Glucose, Bld: 115 mg/dL — ABNORMAL HIGH (ref 65–99)
Potassium: 4.1 mmol/L (ref 3.5–5.1)
Sodium: 137 mmol/L (ref 135–145)

## 2017-04-07 ENCOUNTER — Encounter: Payer: Self-pay | Admitting: Surgical Oncology

## 2017-04-08 ENCOUNTER — Inpatient Hospital Stay: Payer: Managed Care, Other (non HMO) | Attending: Oncology

## 2017-04-08 ENCOUNTER — Inpatient Hospital Stay: Payer: Managed Care, Other (non HMO)

## 2017-04-08 ENCOUNTER — Inpatient Hospital Stay (HOSPITAL_BASED_OUTPATIENT_CLINIC_OR_DEPARTMENT_OTHER): Payer: Managed Care, Other (non HMO) | Admitting: Oncology

## 2017-04-08 VITALS — BP 153/76 | HR 72 | Resp 18

## 2017-04-08 VITALS — BP 173/80 | HR 74 | Temp 97.8°F | Resp 20 | Wt 182.9 lb

## 2017-04-08 DIAGNOSIS — I4891 Unspecified atrial fibrillation: Secondary | ICD-10-CM

## 2017-04-08 DIAGNOSIS — Z85038 Personal history of other malignant neoplasm of large intestine: Secondary | ICD-10-CM | POA: Diagnosis not present

## 2017-04-08 DIAGNOSIS — K219 Gastro-esophageal reflux disease without esophagitis: Secondary | ICD-10-CM

## 2017-04-08 DIAGNOSIS — Z801 Family history of malignant neoplasm of trachea, bronchus and lung: Secondary | ICD-10-CM | POA: Insufficient documentation

## 2017-04-08 DIAGNOSIS — G629 Polyneuropathy, unspecified: Secondary | ICD-10-CM | POA: Diagnosis not present

## 2017-04-08 DIAGNOSIS — R110 Nausea: Secondary | ICD-10-CM | POA: Diagnosis not present

## 2017-04-08 DIAGNOSIS — F17210 Nicotine dependence, cigarettes, uncomplicated: Secondary | ICD-10-CM | POA: Insufficient documentation

## 2017-04-08 DIAGNOSIS — Z79899 Other long term (current) drug therapy: Secondary | ICD-10-CM | POA: Insufficient documentation

## 2017-04-08 DIAGNOSIS — Z808 Family history of malignant neoplasm of other organs or systems: Secondary | ICD-10-CM | POA: Diagnosis not present

## 2017-04-08 DIAGNOSIS — Z7689 Persons encountering health services in other specified circumstances: Secondary | ICD-10-CM | POA: Insufficient documentation

## 2017-04-08 DIAGNOSIS — R197 Diarrhea, unspecified: Secondary | ICD-10-CM | POA: Diagnosis not present

## 2017-04-08 DIAGNOSIS — F419 Anxiety disorder, unspecified: Secondary | ICD-10-CM | POA: Diagnosis not present

## 2017-04-08 DIAGNOSIS — C20 Malignant neoplasm of rectum: Secondary | ICD-10-CM | POA: Diagnosis present

## 2017-04-08 DIAGNOSIS — Z5111 Encounter for antineoplastic chemotherapy: Secondary | ICD-10-CM

## 2017-04-08 DIAGNOSIS — Z9221 Personal history of antineoplastic chemotherapy: Secondary | ICD-10-CM | POA: Diagnosis not present

## 2017-04-08 DIAGNOSIS — Z87442 Personal history of urinary calculi: Secondary | ICD-10-CM

## 2017-04-08 DIAGNOSIS — G4730 Sleep apnea, unspecified: Secondary | ICD-10-CM | POA: Diagnosis not present

## 2017-04-08 DIAGNOSIS — I10 Essential (primary) hypertension: Secondary | ICD-10-CM | POA: Diagnosis not present

## 2017-04-08 DIAGNOSIS — G893 Neoplasm related pain (acute) (chronic): Secondary | ICD-10-CM | POA: Insufficient documentation

## 2017-04-08 DIAGNOSIS — C787 Secondary malignant neoplasm of liver and intrahepatic bile duct: Secondary | ICD-10-CM | POA: Insufficient documentation

## 2017-04-08 LAB — CBC WITH DIFFERENTIAL/PLATELET
Basophils Absolute: 0 10*3/uL (ref 0–0.1)
Basophils Relative: 1 %
Eosinophils Absolute: 0.1 10*3/uL (ref 0–0.7)
Eosinophils Relative: 1 %
HCT: 35.4 % (ref 35.0–47.0)
Hemoglobin: 12.4 g/dL (ref 12.0–16.0)
Lymphocytes Relative: 24 %
Lymphs Abs: 1.7 10*3/uL (ref 1.0–3.6)
MCH: 31.5 pg (ref 26.0–34.0)
MCHC: 35.1 g/dL (ref 32.0–36.0)
MCV: 89.8 fL (ref 80.0–100.0)
Monocytes Absolute: 0.4 10*3/uL (ref 0.2–0.9)
Monocytes Relative: 5 %
Neutro Abs: 5 10*3/uL (ref 1.4–6.5)
Neutrophils Relative %: 69 %
Platelets: 229 10*3/uL (ref 150–440)
RBC: 3.94 MIL/uL (ref 3.80–5.20)
RDW: 15.8 % — ABNORMAL HIGH (ref 11.5–14.5)
WBC: 7.2 10*3/uL (ref 3.6–11.0)

## 2017-04-08 LAB — COMPREHENSIVE METABOLIC PANEL
ALT: 28 U/L (ref 14–54)
AST: 30 U/L (ref 15–41)
Albumin: 3.9 g/dL (ref 3.5–5.0)
Alkaline Phosphatase: 95 U/L (ref 38–126)
Anion gap: 5 (ref 5–15)
BUN: 11 mg/dL (ref 6–20)
CO2: 28 mmol/L (ref 22–32)
Calcium: 9.1 mg/dL (ref 8.9–10.3)
Chloride: 105 mmol/L (ref 101–111)
Creatinine, Ser: 0.71 mg/dL (ref 0.44–1.00)
GFR calc Af Amer: >60 mL/min (ref >60)
GFR calc non Af Amer: >60 mL/min (ref >60)
Glucose, Bld: 111 mg/dL — ABNORMAL HIGH (ref 65–99)
Potassium: 3.8 mmol/L (ref 3.5–5.1)
Sodium: 138 mmol/L (ref 135–145)
Total Bilirubin: 0.3 mg/dL (ref 0.3–1.2)
Total Protein: 6.7 g/dL (ref 6.5–8.1)

## 2017-04-08 LAB — MAGNESIUM: Magnesium: 2 mg/dL (ref 1.7–2.4)

## 2017-04-08 MED ORDER — SODIUM CHLORIDE 0.9 % IV SOLN
2400.0000 mg/m2 | INTRAVENOUS | Status: DC
  Administered 2017-04-08: 4750 mg via INTRAVENOUS
  Filled 2017-04-08: qty 95

## 2017-04-08 MED ORDER — ATROPINE SULFATE 1 MG/ML IJ SOLN
0.5000 mg | Freq: Once | INTRAMUSCULAR | Status: AC | PRN
  Administered 2017-04-08: 0.5 mg via INTRAVENOUS

## 2017-04-08 MED ORDER — HEPARIN SOD (PORK) LOCK FLUSH 100 UNIT/ML IV SOLN: 500.0000 [IU] | Freq: Once | INTRAVENOUS | Status: DC | PRN

## 2017-04-08 MED ORDER — DEXAMETHASONE SODIUM PHOSPHATE 10 MG/ML IJ SOLN
10.0000 mg | Freq: Once | INTRAMUSCULAR | Status: AC
  Administered 2017-04-08: 10 mg via INTRAVENOUS

## 2017-04-08 MED ORDER — LEUCOVORIN CALCIUM INJECTION 350 MG
800.0000 mg | Freq: Once | INTRAVENOUS | Status: AC
  Administered 2017-04-08: 800 mg via INTRAVENOUS
  Filled 2017-04-08: qty 25

## 2017-04-08 MED ORDER — SODIUM CHLORIDE 0.9% FLUSH
10.0000 mL | INTRAVENOUS | Status: DC | PRN
Start: 2017-04-08 — End: 2017-04-08
  Administered 2017-04-08: 10 mL via INTRAVENOUS
  Filled 2017-04-08: qty 10

## 2017-04-08 MED ORDER — IRINOTECAN HCL CHEMO INJECTION 100 MG/5ML
180.0000 mg/m2 | Freq: Once | INTRAVENOUS | Status: AC
  Administered 2017-04-08: 360 mg via INTRAVENOUS
  Filled 2017-04-08: qty 15

## 2017-04-08 MED ORDER — PALONOSETRON HCL INJECTION 0.25 MG/5ML
0.2500 mg | Freq: Once | INTRAVENOUS | Status: AC
  Administered 2017-04-08: 0.25 mg via INTRAVENOUS

## 2017-04-08 MED ORDER — FLUOROURACIL CHEMO INJECTION 2.5 GM/50ML
400.0000 mg/m2 | Freq: Once | INTRAVENOUS | Status: AC
  Administered 2017-04-08: 800 mg via INTRAVENOUS
  Filled 2017-04-08: qty 16

## 2017-04-08 MED ORDER — HEPARIN SOD (PORK) LOCK FLUSH 100 UNIT/ML IV SOLN: 500.0000 [IU] | Freq: Once | INTRAVENOUS | Status: DC

## 2017-04-08 MED ORDER — SODIUM CHLORIDE 0.9 % IV SOLN: 10.0000 mg | Freq: Once | INTRAVENOUS | Status: DC

## 2017-04-08 MED ORDER — SODIUM CHLORIDE 0.9 % IV SOLN
Freq: Once | INTRAVENOUS | Status: AC
  Administered 2017-04-08: 10:00:00 via INTRAVENOUS
  Filled 2017-04-08: qty 1000

## 2017-04-08 NOTE — Progress Notes (Signed)
Hematology/Oncology Consult note [facility]  Telephone:([phone] [phone]  Patient Care Team: Patient, No Pcp Per as PCP - General (General Practice)   Name of the patient: Tamara Hall  [id number]  [birth year]   Date of visit: 04/08/17  Diagnosis- Stage IV rectal cancer  Chief complaint/ Reason for visit- on treatment assessment prior to cycle #2 of FOLFIRI chemotherapy  Heme/Onc history: EVALYNE CORTOPASSI is a 61 y.o. female with clinical stage T3N1M1 rectal cancer. She presented with a 9 month history of rectal bleeding.  Colonoscopyon 01/14/2017 revealed afrond-like/villous non-obstructing large mass in the rectum. The mass was non-circumferential. Biopsies revealed high grade dysplasia within an adenoma (? sampling error).  CEA was 31.6 on 01/15/2017.  Abdomen and pelvic CTon 01/13/2017 revealed irregular thickening of the rectum suspicious for carcinoma. There was a 1.6 cm hypoattenuating lesion in the right hepatic lobe worrisome for metastatic disease.  Chest CT on 01/15/2017 revealed a 9 mm noncalcified left lower lobe pulmonary nodule (new).  Pelvic MRI on 01/16/2017 revealed rectal adenocarcinoma T3N1.  There was extension beyond the muscularis propria (17 mm).  There were mesorectal nodes >= 5 mm.  There was adenopathy on the left side of the mesorectum, including a 9 mm lesion.  The distance from the tumor to the anal sphincter was 8 mm.  PET scanon 01/27/2017 revealed intense metabolic activity associated rectal mass consists with primary rectal carcinoma. There was a hypermetabolic metastatic lesion to the central LEFT hepatic lobe. There was metabolic activity associated with small LEFT lower lobe nodule which was most consistent with pulmonary metastasis.  Liver MRI on 01/31/2017 revealed a 2.6 x 2.7 x 2.1 cm lesion in segment VIII of the liver near the junction with segment IVa c/w a metastatic lesion.  There was a 1.1 x 0.8  cm suspected metastatic lesion peripherally in segment VI of the liver.  CT guided liver biopsyat [facility] on 02/07/2017 revealed malignant cells c/w metastatic colorectal adenocarcinoma. Tumor was sent for  MMR/MSI, KRAS/NRAS, and BRAF.  She is scheduled to have liver directed therapy(ablation) at [facility] on 03/19/2017.  CEA has been followed: 31.6 on 01/15/2017, 38.1 on 02/18/2017, and 25.1 on 03/18/2017.  She is s/p 2 cycle of FOLFOX chemotherapy (02/18/2017 - 03/04/2017).  She developed transient cold induced neuropathy.  She developed wheezing and hypoxia with cycle #2 felt secondary to oxaliplatin.  She was treated with oxygen by face mask, Benadryl, hydrocortisone, and nebulized albuterol.     Interval history- In the interim, she has felt great. She had occasional nausea without vomiting after her treatment. She is taking zofran which is helping with the nausea. She had all over body aches after her neulasta. She did not take claritin or tylenol because she forgot. Her pain in under control. Her hair is beginning to fall out. She has purchased a wig. She is very tearful. She is anxious today.   ECOG PS- 0 Pain scale- 3  Review of systems- Review of Systems  Constitutional: Negative for chills, fever, malaise/fatigue and weight loss.  HENT: Negative for congestion, ear discharge and nosebleeds.   Eyes: Negative for blurred vision.  Respiratory: Negative for cough, hemoptysis, sputum production, shortness of breath and wheezing.   Cardiovascular: Negative for chest pain, palpitations, orthopnea and claudication.  Gastrointestinal: Negative for abdominal pain, blood in stool, constipation, diarrhea, heartburn, melena, nausea and vomiting.  Genitourinary: Negative for dysuria, flank pain, frequency, hematuria and urgency.  Musculoskeletal: Negative for back pain, joint pain  and myalgias.  Skin: Negative for rash.  Neurological: Negative for dizziness, tingling, focal weakness, seizures,  weakness and headaches.  Endo/Heme/Allergies: Does not bruise/bleed easily.  Psychiatric/Behavioral: Negative for depression and suicidal ideas. The patient does not have insomnia.       No Known Allergies   Past Medical History:  Diagnosis Date  . Cancer ([place])   . Hypertension   . Pneumonia    20+ years ago     Past Surgical History:  Procedure Laterality Date  . ABDOMINAL HYSTERECTOMY    . BREAST SURGERY    . COLONOSCOPY WITH PROPOFOL N/A 01/14/2017   Procedure: COLONOSCOPY WITH PROPOFOL;  Surgeon: Lucilla Lame, MD;  Location: [facility] ENDOSCOPY;  Service: Endoscopy;  Laterality: N/A;  . ELBOW SURGERY Right    Has had 4 surgeries on right elbow.  Marland Kitchen HAND SURGERY Right 2008  . JOINT REPLACEMENT    . PORTA CATH INSERTION N/A 01/29/2017   Procedure: Glori Luis Cath Insertion;  Surgeon: Algernon Huxley, MD;  Location: [place] [facility];  Service: Cardiovascular;  Laterality: N/A;  . TONSILLECTOMY      Social History   Social History  . Marital status: Divorced    Spouse name: N/A  . Number of children: N/A  . Years of education: N/A   Occupational History  . Not on file.   Social History Main Topics  . Smoking status: Current Every Day Smoker    Packs/day: 2.00    Years: 40.00    Types: Cigarettes  . Smokeless tobacco: Never Used  . Alcohol use No  . Drug use: No  . Sexual activity: Not on file   Other Topics Concern  . Not on file   Social History Narrative  . No narrative on file    Family History  Problem Relation Age of Onset  . Lung cancer Mother   . Lung cancer Father   . Brain cancer Maternal Grandmother      Current Outpatient Prescriptions:  .  acetaminophen (TYLENOL) 325 MG tablet, Take 2 tablets (650 mg total) by mouth every 6 (six) hours as needed for mild pain (or Fever >/= 101). (Patient taking differently: Take 650 mg by mouth 2 (two) times daily as needed for mild pain (or Fever >/= 101). ), Disp: , Rfl:  .  busPIRone (BUSPAR) 7.5 MG tablet, Take  7.5 mg by mouth., Disp: , Rfl:  .  Hydrocodone-Acetaminophen 7.5-300 MG TABS, Take 1 tablet by mouth every 6 (six) hours as needed (pain)., Disp: 60 each, Rfl: 0 .  lidocaine-prilocaine (EMLA) cream, Apply 1 application topically as needed. Apply small amount to port site at least 1 hour prior to it being accessed, cover with plastic wrap, Disp: 30 g, Rfl: 1 .  LORazepam (ATIVAN) 0.5 MG tablet, Take 0.5 mg by mouth as needed., Disp: , Rfl:  .  Magnesium Hydroxide (MILK OF MAGNESIA PO), Take 30 mLs by mouth at bedtime. , Disp: , Rfl:  .  ondansetron (ZOFRAN) 8 MG tablet, Take 8 mg by mouth every 8 (eight) hours as needed., Disp: , Rfl:  .  oxyCODONE (OXY IR/ROXICODONE) 5 MG immediate release tablet, Take 1 tablet (5 mg total) by mouth every 4 (four) hours as needed for severe pain., Disp: 30 tablet, Rfl: 0 .  polyethylene glycol (MIRALAX / GLYCOLAX) packet, Take by mouth., Disp: , Rfl:  No current facility-administered medications for this visit.   Facility-Administered Medications Ordered in Other Visits:  .  fluorouracil (ADRUCIL) 4,750 mg in  sodium chloride 0.9 % 55 mL chemo infusion, 2,400 mg/m2 (Treatment Plan Recorded), Intravenous, 1 day or 1 dose, Corcoran, Melissa C, MD .  fluorouracil (ADRUCIL) chemo injection 800 mg, 400 mg/m2 (Treatment Plan Recorded), Intravenous, Once, Corcoran, Melissa C, MD .  heparin lock flush 100 unit/mL, 500 Units, Intravenous, Once, Corcoran, Melissa C, MD .  heparin lock flush 100 unit/mL, 500 Units, Intracatheter, Once PRN, Mike Gip, Melissa C, MD .  irinotecan (CAMPTOSAR) 360 mg in dextrose 5 % 500 mL chemo infusion, 180 mg/m2 (Treatment Plan Recorded), Intravenous, Once, Corcoran, Melissa C, MD, Last Rate: 345 mL/hr at 04/08/17 1114, 360 mg at 04/08/17 1114 .  leucovorin 800 mg in dextrose 5 % 250 mL infusion, 800 mg, Intravenous, Once, Corcoran, Melissa C, MD, Last Rate: 193 mL/hr at 04/08/17 1114, 800 mg at 04/08/17 1114 .  sodium chloride flush (NS) 0.9 %  injection 10 mL, 10 mL, Intravenous, PRN, Mike Gip, Melissa C, MD, 10 mL at 04/08/17 0859  Physical exam:  Vitals:   04/08/17 0943  BP: (!) 173/80  Pulse: 74  Resp: 20  Temp: 97.8 F (36.6 C)  TempSrc: Tympanic  Weight: 182 lb 14.4 oz (83 kg)   Physical Exam  Constitutional: She is oriented to person, place, and time and well-developed, well-nourished, and in no distress.  HENT:  Head: Normocephalic and atraumatic.  Eyes: EOM are normal. Pupils are equal, round, and reactive to light.  Neck: Normal range of motion.  Cardiovascular: Normal rate, regular rhythm and normal heart sounds.   Pulmonary/Chest: Effort normal and breath sounds normal.  Abdominal: Soft. Bowel sounds are normal.  Musculoskeletal: Normal range of motion.  Neurological: She is alert and oriented to person, place, and time.  Skin: Skin is warm and dry.  Psychiatric: Affect normal.     CMP Latest Ref Rng & Units 04/08/2017  Glucose 65 - 99 mg/dL 111(H)  BUN 6 - 20 mg/dL 11  Creatinine 0.44 - 1.00 mg/dL 0.71  Sodium 135 - 145 mmol/L 138  Potassium 3.5 - 5.1 mmol/L 3.8  Chloride 101 - 111 mmol/L 105  CO2 22 - 32 mmol/L 28  Calcium 8.9 - 10.3 mg/dL 9.1  Total Protein 6.5 - 8.1 g/dL 6.7  Total Bilirubin 0.3 - 1.2 mg/dL 0.3  Alkaline Phos 38 - 126 U/L 95  AST 15 - 41 U/L 30  ALT 14 - 54 U/L 28   CBC Latest Ref Rng & Units 04/08/2017  WBC 3.6 - 11.0 K/uL 7.2  Hemoglobin 12.0 - 16.0 g/dL 12.4  Hematocrit 35.0 - 47.0 % 35.4  Platelets 150 - 440 K/uL 229    Assessment and plan- Patient is a 61 y.o. female with stage IV rectal cancer status post 2 cycles of FOLFOX chemotherapy. Oxaliplatin was stopped due to allergy reaction she was switched to FOLFIRI. She is here today for assessment prior to cycle # 2 of FOLFIRI chemotherapy. Labs look ok for treatment.  1. Cycle # 2 FOLFIRI today.  2. RTC in 1 week for labs (CBC with diff and CMP). 3. RTC in 2 weeks for labs (CBC with Diff, CMP, magnesium and CEA), MD and  cycle # 3 FOLFIRI. 4. Hypertension: BP elevated. She is highly anxious today. Monitor. Continue BP medications as perscribed by PCP.    Visit Diagnosis 1. Rectal cancer ([place])   2. Encounter for antineoplastic chemotherapy   3. Cancer related pain      Faythe Casa, NP [place] [facility]  04/08/2017 11:43 AM

## 2017-04-08 NOTE — Progress Notes (Signed)
Patient denies any concerns today.  

## 2017-04-09 LAB — CEA: CEA: 11.8 ng/mL — ABNORMAL HIGH (ref 0.0–4.7)

## 2017-04-10 ENCOUNTER — Inpatient Hospital Stay: Payer: Managed Care, Other (non HMO)

## 2017-04-10 DIAGNOSIS — C20 Malignant neoplasm of rectum: Secondary | ICD-10-CM

## 2017-04-10 MED ORDER — SODIUM CHLORIDE 0.9% FLUSH
10.0000 mL | INTRAVENOUS | Status: DC | PRN
  Administered 2017-04-10: 10 mL
  Filled 2017-04-10: qty 10

## 2017-04-10 MED ORDER — PEGFILGRASTIM INJECTION 6 MG/0.6ML ~~LOC~~
6.0000 mg | PREFILLED_SYRINGE | Freq: Once | SUBCUTANEOUS | Status: AC
  Administered 2017-04-10: 6 mg via SUBCUTANEOUS

## 2017-04-10 MED ORDER — HEPARIN SOD (PORK) LOCK FLUSH 100 UNIT/ML IV SOLN
500.0000 [IU] | Freq: Once | INTRAVENOUS | Status: AC | PRN
  Administered 2017-04-10: 500 [IU]

## 2017-04-10 NOTE — Progress Notes (Signed)
Pt informed this Probation officer that she "took a long, hot shower this morning and washed my hair" pt educated today on care of port when accessed and NOT to shower when port is accessed. Pt verbalizes understanding.

## 2017-04-15 ENCOUNTER — Inpatient Hospital Stay: Payer: Managed Care, Other (non HMO)

## 2017-04-17 ENCOUNTER — Inpatient Hospital Stay: Payer: Managed Care, Other (non HMO)

## 2017-04-17 ENCOUNTER — Other Ambulatory Visit: Payer: Self-pay | Admitting: *Deleted

## 2017-04-17 DIAGNOSIS — C20 Malignant neoplasm of rectum: Secondary | ICD-10-CM

## 2017-04-17 LAB — CBC WITH DIFFERENTIAL/PLATELET
Basophils Absolute: 0 10*3/uL (ref 0–0.1)
Basophils Relative: 0 %
Eosinophils Absolute: 0.1 10*3/uL (ref 0–0.7)
Eosinophils Relative: 1 %
HCT: 36.3 % (ref 35.0–47.0)
Hemoglobin: 12.5 g/dL (ref 12.0–16.0)
Lymphocytes Relative: 13 %
Lymphs Abs: 2.4 10*3/uL (ref 1.0–3.6)
MCH: 31.1 pg (ref 26.0–34.0)
MCHC: 34.5 g/dL (ref 32.0–36.0)
MCV: 90.1 fL (ref 80.0–100.0)
Monocytes Absolute: 0.9 10*3/uL (ref 0.2–0.9)
Monocytes Relative: 5 %
Neutro Abs: 14.7 10*3/uL — ABNORMAL HIGH (ref 1.4–6.5)
Neutrophils Relative %: 81 %
Platelets: 206 10*3/uL (ref 150–440)
RBC: 4.03 MIL/uL (ref 3.80–5.20)
RDW: 16.8 % — ABNORMAL HIGH (ref 11.5–14.5)
WBC: 18.2 10*3/uL — ABNORMAL HIGH (ref 3.6–11.0)

## 2017-04-17 LAB — COMPREHENSIVE METABOLIC PANEL
ALT: 23 U/L (ref 14–54)
AST: 29 U/L (ref 15–41)
Albumin: 3.9 g/dL (ref 3.5–5.0)
Alkaline Phosphatase: 159 U/L — ABNORMAL HIGH (ref 38–126)
Anion gap: 7 (ref 5–15)
BUN: 12 mg/dL (ref 6–20)
CO2: 24 mmol/L (ref 22–32)
Calcium: 9 mg/dL (ref 8.9–10.3)
Chloride: 104 mmol/L (ref 101–111)
Creatinine, Ser: 0.86 mg/dL (ref 0.44–1.00)
GFR calc Af Amer: >60 mL/min (ref >60)
GFR calc non Af Amer: >60 mL/min (ref >60)
Glucose, Bld: 134 mg/dL — ABNORMAL HIGH (ref 65–99)
Potassium: 3.7 mmol/L (ref 3.5–5.1)
Sodium: 135 mmol/L (ref 135–145)
Total Bilirubin: 0.3 mg/dL (ref 0.3–1.2)
Total Protein: 6.7 g/dL (ref 6.5–8.1)

## 2017-04-17 MED ORDER — HYDROCODONE-ACETAMINOPHEN 7.5-300 MG PO TABS: 1.0000 | ORAL_TABLET | Freq: Four times a day (QID) | ORAL | 0 refills | Status: DC | PRN

## 2017-04-22 ENCOUNTER — Inpatient Hospital Stay: Payer: Managed Care, Other (non HMO)

## 2017-04-22 ENCOUNTER — Inpatient Hospital Stay (HOSPITAL_BASED_OUTPATIENT_CLINIC_OR_DEPARTMENT_OTHER): Payer: Managed Care, Other (non HMO) | Admitting: Hematology and Oncology

## 2017-04-22 VITALS — Temp 97.2°F

## 2017-04-22 VITALS — BP 145/71 | HR 85 | Temp 94.3°F | Resp 18 | Wt 184.4 lb

## 2017-04-22 DIAGNOSIS — F17210 Nicotine dependence, cigarettes, uncomplicated: Secondary | ICD-10-CM | POA: Diagnosis not present

## 2017-04-22 DIAGNOSIS — Z7689 Persons encountering health services in other specified circumstances: Secondary | ICD-10-CM

## 2017-04-22 DIAGNOSIS — Z801 Family history of malignant neoplasm of trachea, bronchus and lung: Secondary | ICD-10-CM

## 2017-04-22 DIAGNOSIS — C787 Secondary malignant neoplasm of liver and intrahepatic bile duct: Secondary | ICD-10-CM

## 2017-04-22 DIAGNOSIS — I10 Essential (primary) hypertension: Secondary | ICD-10-CM | POA: Diagnosis not present

## 2017-04-22 DIAGNOSIS — G893 Neoplasm related pain (acute) (chronic): Secondary | ICD-10-CM

## 2017-04-22 DIAGNOSIS — Z808 Family history of malignant neoplasm of other organs or systems: Secondary | ICD-10-CM

## 2017-04-22 DIAGNOSIS — G629 Polyneuropathy, unspecified: Secondary | ICD-10-CM

## 2017-04-22 DIAGNOSIS — Z79899 Other long term (current) drug therapy: Secondary | ICD-10-CM | POA: Diagnosis not present

## 2017-04-22 DIAGNOSIS — F419 Anxiety disorder, unspecified: Secondary | ICD-10-CM

## 2017-04-22 DIAGNOSIS — Z5111 Encounter for antineoplastic chemotherapy: Secondary | ICD-10-CM | POA: Diagnosis not present

## 2017-04-22 DIAGNOSIS — C20 Malignant neoplasm of rectum: Secondary | ICD-10-CM

## 2017-04-22 DIAGNOSIS — R110 Nausea: Secondary | ICD-10-CM

## 2017-04-22 LAB — CBC WITH DIFFERENTIAL/PLATELET
Basophils Absolute: 0 10*3/uL (ref 0–0.1)
Basophils Relative: 0 %
Eosinophils Absolute: 0.1 10*3/uL (ref 0–0.7)
Eosinophils Relative: 1 %
HCT: 36.2 % (ref 35.0–47.0)
Hemoglobin: 12.7 g/dL (ref 12.0–16.0)
Lymphocytes Relative: 18 %
Lymphs Abs: 1.8 10*3/uL (ref 1.0–3.6)
MCH: 31.9 pg (ref 26.0–34.0)
MCHC: 35.2 g/dL (ref 32.0–36.0)
MCV: 90.6 fL (ref 80.0–100.0)
Monocytes Absolute: 0.5 10*3/uL (ref 0.2–0.9)
Monocytes Relative: 4 %
Neutro Abs: 8 10*3/uL — ABNORMAL HIGH (ref 1.4–6.5)
Neutrophils Relative %: 77 %
Platelets: 180 10*3/uL (ref 150–440)
RBC: 3.99 MIL/uL (ref 3.80–5.20)
RDW: 17.8 % — ABNORMAL HIGH (ref 11.5–14.5)
WBC: 10.4 10*3/uL (ref 3.6–11.0)

## 2017-04-22 LAB — COMPREHENSIVE METABOLIC PANEL
ALT: 31 U/L (ref 14–54)
AST: 52 U/L — ABNORMAL HIGH (ref 15–41)
Albumin: 4 g/dL (ref 3.5–5.0)
Alkaline Phosphatase: 112 U/L (ref 38–126)
Anion gap: 6 (ref 5–15)
BUN: 13 mg/dL (ref 6–20)
CO2: 27 mmol/L (ref 22–32)
Calcium: 9.3 mg/dL (ref 8.9–10.3)
Chloride: 103 mmol/L (ref 101–111)
Creatinine, Ser: 0.7 mg/dL (ref 0.44–1.00)
GFR calc Af Amer: >60 mL/min (ref >60)
GFR calc non Af Amer: >60 mL/min (ref >60)
Glucose, Bld: 123 mg/dL — ABNORMAL HIGH (ref 65–99)
Potassium: 3.9 mmol/L (ref 3.5–5.1)
Sodium: 136 mmol/L (ref 135–145)
Total Bilirubin: 0.3 mg/dL (ref 0.3–1.2)
Total Protein: 6.9 g/dL (ref 6.5–8.1)

## 2017-04-22 LAB — MAGNESIUM: Magnesium: 2 mg/dL (ref 1.7–2.4)

## 2017-04-22 MED ORDER — IRINOTECAN HCL CHEMO INJECTION 100 MG/5ML
180.0000 mg/m2 | Freq: Once | INTRAVENOUS | Status: AC
  Administered 2017-04-22: 360 mg via INTRAVENOUS
  Filled 2017-04-22: qty 15

## 2017-04-22 MED ORDER — SODIUM CHLORIDE 0.9 % IV SOLN
Freq: Once | INTRAVENOUS | Status: AC
  Administered 2017-04-22: 11:00:00 via INTRAVENOUS
  Filled 2017-04-22: qty 1000

## 2017-04-22 MED ORDER — SODIUM CHLORIDE 0.9% FLUSH
10.0000 mL | INTRAVENOUS | Status: DC | PRN
  Administered 2017-04-22: 10 mL via INTRAVENOUS
  Filled 2017-04-22: qty 10

## 2017-04-22 MED ORDER — ATROPINE SULFATE 1 MG/ML IJ SOLN
0.5000 mg | Freq: Once | INTRAMUSCULAR | Status: AC | PRN
  Administered 2017-04-22: 0.5 mg via INTRAVENOUS
  Filled 2017-04-22: qty 1

## 2017-04-22 MED ORDER — PALONOSETRON HCL INJECTION 0.25 MG/5ML
0.2500 mg | Freq: Once | INTRAVENOUS | Status: AC
  Administered 2017-04-22: 0.25 mg via INTRAVENOUS
  Filled 2017-04-22: qty 5

## 2017-04-22 MED ORDER — LEUCOVORIN CALCIUM INJECTION 350 MG
800.0000 mg | Freq: Once | INTRAMUSCULAR | Status: AC
  Administered 2017-04-22: 800 mg via INTRAVENOUS
  Filled 2017-04-22: qty 25

## 2017-04-22 MED ORDER — SODIUM CHLORIDE 0.9 % IV SOLN
2400.0000 mg/m2 | INTRAVENOUS | Status: DC
  Administered 2017-04-22: 4750 mg via INTRAVENOUS
  Filled 2017-04-22: qty 95

## 2017-04-22 MED ORDER — DEXAMETHASONE SODIUM PHOSPHATE 10 MG/ML IJ SOLN
10.0000 mg | Freq: Once | INTRAMUSCULAR | Status: AC
  Administered 2017-04-22: 10 mg via INTRAVENOUS
  Filled 2017-04-22: qty 1

## 2017-04-22 MED ORDER — FLUOROURACIL CHEMO INJECTION 2.5 GM/50ML
400.0000 mg/m2 | Freq: Once | INTRAVENOUS | Status: AC
  Administered 2017-04-22: 800 mg via INTRAVENOUS
  Filled 2017-04-22: qty 16

## 2017-04-22 MED ORDER — ONDANSETRON HCL 8 MG PO TABS: 8.0000 mg | ORAL_TABLET | Freq: Three times a day (TID) | ORAL | 2 refills | Status: DC | PRN

## 2017-04-22 MED ORDER — HEPARIN SOD (PORK) LOCK FLUSH 100 UNIT/ML IV SOLN
500.0000 [IU] | Freq: Once | INTRAVENOUS | Status: DC
  Filled 2017-04-22: qty 5

## 2017-04-22 NOTE — Progress Notes (Signed)
[place] [facility] day:  04/22/2017   Chief Complaint: Tamara Hall is a 61 y.o. female with stage IV rectal cancer who is seen for assessment prior to cycle #3 FOLFIRI chemotherapy.  HPI:  The patient was last seen in the medical oncology clinic by me on 03/24/2017.  At that time, she received cycle #1 FOLFIRI.  She was seen by Rulon Abide, NP in my absence on 04/08/2017.  At that time, she was doing well.  She described occasional nausea and vomiting after her treatment.  She described Neulasta induced bone pain.  She had forgotten to take Claritin.  She received cycle #2 FOLFIRI.  During the interm, she notes nausea since last cycle.  She is using ondansetron.  She is eating.  Weight is up 2 pounds.  She notes diarrhea 2 days after chemotherapy well managed with Imodium.  She has had rectal bleeding last night.  She is smoking.   Past Medical History:  Diagnosis Date  . Cancer ([place])   . Hypertension   . Pneumonia    20+ years ago    Past Surgical History:  Procedure Laterality Date  . ABDOMINAL HYSTERECTOMY    . BREAST SURGERY    . COLONOSCOPY WITH PROPOFOL N/A 01/14/2017   Procedure: COLONOSCOPY WITH PROPOFOL;  Surgeon: Lucilla Lame, MD;  Location: [facility] ENDOSCOPY;  Service: Endoscopy;  Laterality: N/A;  . ELBOW SURGERY Right    Has had 4 surgeries on right elbow.  Marland Kitchen HAND SURGERY Right 2008  . JOINT REPLACEMENT    . PORTA CATH INSERTION N/A 01/29/2017   Procedure: Glori Luis Cath Insertion;  Surgeon: Algernon Huxley, MD;  Location: [place] [facility];  Service: Cardiovascular;  Laterality: N/A;  . TONSILLECTOMY      Family History  Problem Relation Age of Onset  . Lung cancer Mother   . Lung cancer Father   . Brain cancer Maternal Grandmother     Social History:  reports that she has been smoking Cigarettes.  She has a 80.00 pack-year smoking history. She has never used smokeless tobacco. She reports that she does not drink alcohol or use  drugs.  She lives in [place].  She has 3 children (ages 58, 71, and 79).  She smokes 2 packs a day.  She started smoking at age 19.  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  [name] is her significant other.  She is alone today.  Allergies: No Known Allergies  Current Medications: Current Outpatient Prescriptions  Medication Sig Dispense Refill  . acetaminophen (TYLENOL) 325 MG tablet Take 2 tablets (650 mg total) by mouth every 6 (six) hours as needed for mild pain (or Fever >/= 101). (Patient taking differently: Take 650 mg by mouth 2 (two) times daily as needed for mild pain (or Fever >/= 101). )    . busPIRone (BUSPAR) 7.5 MG tablet Take 7.5 mg by mouth.    . Hydrocodone-Acetaminophen 7.5-300 MG TABS Take 1 tablet by mouth every 6 (six) hours as needed (pain). 60 each 0  . lidocaine-prilocaine (EMLA) cream Apply 1 application topically as needed. Apply small amount to port site at least 1 hour prior to it being accessed, cover with plastic wrap 30 g 1  . Magnesium Hydroxide (MILK OF MAGNESIA PO) Take 30 mLs by mouth at bedtime.     . ondansetron (ZOFRAN) 8 MG tablet Take 1 tablet (8 mg  total) by mouth every 8 (eight) hours as needed. 30 tablet 2  . polyethylene glycol (MIRALAX / GLYCOLAX) packet Take by mouth.    Marland Kitchen LORazepam (ATIVAN) 0.5 MG tablet Take 1 tablet (0.5 mg total) by mouth every 6 (six) hours as needed. For nausea or vomiting 30 tablet 1   No current facility-administered medications for this visit.     Review of Systems:  GENERAL:  Feels "ok".  No fevers or sweats.  Weight up 2 pounds. PERFORMANCE STATUS (ECOG):  1 HEENT:  No visual changes, runny nose, sore throat, mouth sores or tenderness. Lungs: No shortness of breath or cough.  No hemoptysis. Cardiac:  No chest pain, palpitations, orthopnea, or PND.  Elevated BP due to anxiety. GI:  Eating.  Nausea.  Rectal bleeding last night.  No vomiting, constipation or  melena. GU:  No urgency, frequency, dysuria, or hematuria. Musculoskeletal:  No back pain.  No joint pain.  No muscle tenderness. Extremities:  No pain or swelling. Skin:  No rashes or skin changes. Neuro:  No headache, numbness or weakness, balance or coordination issues. Endocrine:  No diabetes, thyroid issues, hot flashes or night sweats. Psych:  Stress/anxiety.  No depression. Pain: Rectal pain (5 out of 10). Review of systems:  All other systems reviewed and found to be negative.  Physical Exam: Blood pressure (!) 145/71, pulse 85, temperature (!) 94.3 F (34.6 C), temperature source Tympanic, resp. rate 18, weight 184 lb 6 oz (83.6 kg). GENERAL:  Well developed, well nourished, woman sitting comfortably in the oncology clinic in no acute distress.   MENTAL STATUS:  Alert and oriented to person, place and time. HEAD:Long grayhair in a pony tail. Normocephalic, atraumatic, face symmetric, no Cushingoid features. EYES:Blueeyes. Pupils equal round and reactive to light and accomodation. No conjunctivitis or scleral icterus. PJK:DTOIZTIWPY clear without lesion. Tonguenormal. Mucous membranes moist. RESPIRATORY:Clear to auscultationwithout rales, wheezes or rhonchi. CARDIOVASCULAR:Regular rate andrhythmwithout murmur, rub or gallop. ABDOMEN:Soft, non-tender with active bowel soundsand no hepatosplenomegaly. No masses. SKIN: No rashes, ulcers or lesions. EXTREMITIES: No edema, no skin discoloration or tenderness. No palpable cords. LYMPHNODES: No palpable cervical, supraclavicular, axillary or inguinal adenopathy  NEUROLOGICAL: Unremarkable. PSYCH: Appropriate.   Infusion on 04/22/2017  Component Date Value Ref Range Status  . WBC 04/22/2017 10.4  3.6 - 11.0 K/uL Final  . RBC 04/22/2017 3.99  3.80 - 5.20 MIL/uL Final  . Hemoglobin 04/22/2017 12.7  12.0 - 16.0 g/dL Final  . HCT 04/22/2017 36.2  35.0 - 47.0 % Final  . MCV 04/22/2017 90.6  80.0 - 100.0 fL  Final  . MCH 04/22/2017 31.9  26.0 - 34.0 pg Final  . MCHC 04/22/2017 35.2  32.0 - 36.0 g/dL Final  . RDW 04/22/2017 17.8* 11.5 - 14.5 % Final  . Platelets 04/22/2017 180  150 - 440 K/uL Final  . Neutrophils Relative % 04/22/2017 77  % Final  . Neutro Abs 04/22/2017 8.0* 1.4 - 6.5 K/uL Final  . Lymphocytes Relative 04/22/2017 18  % Final  . Lymphs Abs 04/22/2017 1.8  1.0 - 3.6 K/uL Final  . Monocytes Relative 04/22/2017 4  % Final  . Monocytes Absolute 04/22/2017 0.5  0.2 - 0.9 K/uL Final  . Eosinophils Relative 04/22/2017 1  % Final  . Eosinophils Absolute 04/22/2017 0.1  0 - 0.7 K/uL Final  . Basophils Relative 04/22/2017 0  % Final  . Basophils Absolute 04/22/2017 0.0  0 - 0.1 K/uL Final  . Sodium 04/22/2017 136  135 - 145  mmol/L Final  . Potassium 04/22/2017 3.9  3.5 - 5.1 mmol/L Final  . Chloride 04/22/2017 103  101 - 111 mmol/L Final  . CO2 04/22/2017 27  22 - 32 mmol/L Final  . Glucose, Bld 04/22/2017 123* 65 - 99 mg/dL Final  . BUN 04/22/2017 13  6 - 20 mg/dL Final  . Creatinine, Ser 04/22/2017 0.70  0.44 - 1.00 mg/dL Final  . Calcium 04/22/2017 9.3  8.9 - 10.3 mg/dL Final  . Total Protein 04/22/2017 6.9  6.5 - 8.1 g/dL Final  . Albumin 04/22/2017 4.0  3.5 - 5.0 g/dL Final  . AST 04/22/2017 52* 15 - 41 U/L Final  . ALT 04/22/2017 31  14 - 54 U/L Final  . Alkaline Phosphatase 04/22/2017 112  38 - 126 U/L Final  . Total Bilirubin 04/22/2017 0.3  0.3 - 1.2 mg/dL Final  . GFR calc non Af Amer 04/22/2017 >60  >60 mL/min Final  . GFR calc Af Amer 04/22/2017 >60  >60 mL/min Final   Comment: (NOTE) The eGFR has been calculated using the CKD EPI equation. This calculation has not been validated in all clinical situations. eGFR's persistently <60 mL/min signify possible Chronic Kidney Disease.   . Anion gap 04/22/2017 6  5 - 15 Final  . Magnesium 04/22/2017 2.0  1.7 - 2.4 mg/dL Final  . CEA 04/22/2017 9.2* 0.0 - 4.7 ng/mL Final   Comment: (NOTE)       Roche ECLIA methodology        Nonsmokers  <3.9                                     Smokers     <5.6 Performed At: [facility] [place], [place] [id number] Lindon Romp MD CX:[id number]     Assessment:  Tamara CARBONE is a 60 y.o. female with clinical stage T3N1M1 rectal cancer.   She presented with a 9 month history of rectal bleeding.  Colonoscopy on 01/14/2017 revealed a frond-like/villous non-obstructing large mass in the rectum. The mass was non-circumferential.  Biopsies revealed high grade dysplasia within an adenoma (? sampling error).  CEA was 31.6 on 01/15/2017.  Abdomen and pelvic CT on 01/13/2017 revealed irregular thickening of the rectum suspicious for carcinoma.  There was a 1.6 cm hypoattenuating lesion in the right hepatic lobe worrisome for metastatic disease.  Chest CT on 01/15/2017 revealed a 9 mm noncalcified left lower lobe pulmonary nodule (new).  Pelvic MRI on 01/16/2017 revealed rectal adenocarcinoma T3N1.  There was extension beyond the muscularis propria (17 mm).  There were mesorectal nodes >= 5 mm.  There was adenopathy on the left side of the mesorectum, including a 9 mm lesion.  The distance from the tumor to the anal sphincter was 8 mm.  PET scan on 01/27/2017 revealed intense metabolic activity associated rectal mass consists with primary rectal carcinoma.  There was a hypermetabolic metastatic lesion to the central LEFT hepatic lobe.  There was metabolic activity associated with small LEFT lower lobe nodule which was most consistent with pulmonary metastasis.  Liver MRI on 01/31/2017 revealed a 2.6 x 2.7 x 2.1 cm lesion in segment VIII of the liver near the junction with segment IVa c/w a metastatic lesion.  There was a 1.1 x 0.8 cm suspected metastatic lesion peripherally in segment VI of the liver.  CT guided liver [facility] at [facility] on 02/07/2017 revealed malignant  cells c/w metastatic colorectal adenocarcinoma.  Tumor was sent for  MMR/MSI, KRAS/NRAS, and BRAF.  She is  scheduled to have liver directed therapy (ablation) at [facility] on 03/19/2017.  CEA has been followed: 31.6 on 01/15/2017, 38.1 on 02/18/2017, 25.1 on 03/18/2017, and 11.8 on 04/08/2017.  She is s/p 2 cycle of FOLFOX  (02/18/2017 - 03/04/2017).  She developed transient cold induced neuropathy.  She developed wheezing and hypoxia with cycle #2 felt secondary to oxaliplatin.  She was treated with oxygen by face mask, Benadryl, hydrocortisone, and nebulized albuterol.    She is s/p 2 cycles of FOLFIRI (03/24/2017 - 04/08/2017) with Neulasta support.  She has had nausea.  Diarrhea is controlled with Imodium.  She has an 80 pack year smoking history.  She is smoking.  Symptomatically, she has nausea associated with chemotherapy. She had rectal bleeding last night.  Exam is stable.  Plan: 1.  Labs today: CBC with diff, CMP, Mg, CEA. 2.  Cycle #3 FOLFIRI chemotherapy today. 3.  RTC in 2 days for disconnect and Neulasta. 4.  Abdominal and pelvic CT scan in 1  1/2 weeks. 5.  Discuss nausea.  Encourage patient to try Ativan (has prescription).   6.  Rx: ondansetron 8 mg po q 8 hours prn nausea (dis: #30; 2 refills). 7.  Encourage smoking cessation. 8.  RTC in 2 weeks for MD assessment, labs (CBC with diff, CMP, Mg, CEA), review of scans, and cycle #4 FOLFIRI.   Lequita Asal, MD  04/22/2017

## 2017-04-22 NOTE — Progress Notes (Signed)
Patient states she started bleeding rectally last night.  She states it was enough that she had to change clothes.  Patient also states she has had unrelenting nausea since her last chemo treatment.  She has been taking 3 8 mg Zofrans a day without resolve.  She is asking if there is something stronger that she can be given.  She also states when she lies down at night her heart is pounding so hard she can feel it all over her body.

## 2017-04-23 ENCOUNTER — Telehealth: Payer: Self-pay | Admitting: *Deleted

## 2017-04-23 LAB — CEA: CEA: 9.2 ng/mL — ABNORMAL HIGH (ref 0.0–4.7)

## 2017-04-23 NOTE — Telephone Encounter (Signed)
-----   Message from Lequita Asal, MD sent at 04/23/2017  5:26 AM EDT ----- Regarding: Please call patient  CEA continues to decrease.  M  ----- Message ----- From: Interface, Lab In [place] Sent: 04/22/2017   9:22 AM To: Lequita Asal, MD

## 2017-04-23 NOTE — Telephone Encounter (Signed)
Called patient and LVM that tumor marker is decreasing.

## 2017-04-24 ENCOUNTER — Inpatient Hospital Stay: Payer: Managed Care, Other (non HMO)

## 2017-04-24 DIAGNOSIS — C20 Malignant neoplasm of rectum: Secondary | ICD-10-CM

## 2017-04-24 MED ORDER — PEGFILGRASTIM INJECTION 6 MG/0.6ML ~~LOC~~
6.0000 mg | PREFILLED_SYRINGE | Freq: Once | SUBCUTANEOUS | Status: AC
  Administered 2017-04-24: 6 mg via SUBCUTANEOUS
  Filled 2017-04-24: qty 0.6

## 2017-04-24 MED ORDER — HEPARIN SOD (PORK) LOCK FLUSH 100 UNIT/ML IV SOLN
500.0000 [IU] | Freq: Once | INTRAVENOUS | Status: AC | PRN
  Administered 2017-04-24: 500 [IU]
  Filled 2017-04-24: qty 5

## 2017-04-24 MED ORDER — SODIUM CHLORIDE 0.9% FLUSH
10.0000 mL | INTRAVENOUS | Status: DC | PRN
  Administered 2017-04-24: 10 mL
  Filled 2017-04-24: qty 10

## 2017-05-01 ENCOUNTER — Telehealth: Payer: Self-pay | Admitting: *Deleted

## 2017-05-01 MED ORDER — LORAZEPAM 0.5 MG PO TABS: 0.5000 mg | ORAL_TABLET | Freq: Four times a day (QID) | ORAL | 1 refills | Status: DC | PRN

## 2017-05-01 NOTE — Telephone Encounter (Signed)
Called in Lorazepam

## 2017-05-02 ENCOUNTER — Ambulatory Visit: Admission: RE | Admit: 2017-05-02 | Payer: Managed Care, Other (non HMO) | Source: Ambulatory Visit

## 2017-05-06 ENCOUNTER — Encounter: Payer: Self-pay | Admitting: Hematology and Oncology

## 2017-05-06 ENCOUNTER — Inpatient Hospital Stay: Payer: Managed Care, Other (non HMO)

## 2017-05-06 ENCOUNTER — Inpatient Hospital Stay (HOSPITAL_BASED_OUTPATIENT_CLINIC_OR_DEPARTMENT_OTHER): Payer: Managed Care, Other (non HMO) | Admitting: Hematology and Oncology

## 2017-05-06 VITALS — BP 120/75 | HR 77 | Temp 95.5°F | Resp 18 | Wt 186.3 lb

## 2017-05-06 DIAGNOSIS — R110 Nausea: Secondary | ICD-10-CM | POA: Insufficient documentation

## 2017-05-06 DIAGNOSIS — R197 Diarrhea, unspecified: Secondary | ICD-10-CM

## 2017-05-06 DIAGNOSIS — Z79899 Other long term (current) drug therapy: Secondary | ICD-10-CM

## 2017-05-06 DIAGNOSIS — I10 Essential (primary) hypertension: Secondary | ICD-10-CM | POA: Diagnosis not present

## 2017-05-06 DIAGNOSIS — G629 Polyneuropathy, unspecified: Secondary | ICD-10-CM | POA: Diagnosis not present

## 2017-05-06 DIAGNOSIS — Z801 Family history of malignant neoplasm of trachea, bronchus and lung: Secondary | ICD-10-CM | POA: Diagnosis not present

## 2017-05-06 DIAGNOSIS — Z7689 Persons encountering health services in other specified circumstances: Secondary | ICD-10-CM | POA: Diagnosis not present

## 2017-05-06 DIAGNOSIS — C20 Malignant neoplasm of rectum: Secondary | ICD-10-CM

## 2017-05-06 DIAGNOSIS — C787 Secondary malignant neoplasm of liver and intrahepatic bile duct: Secondary | ICD-10-CM | POA: Diagnosis not present

## 2017-05-06 DIAGNOSIS — G893 Neoplasm related pain (acute) (chronic): Secondary | ICD-10-CM | POA: Diagnosis not present

## 2017-05-06 DIAGNOSIS — F419 Anxiety disorder, unspecified: Secondary | ICD-10-CM

## 2017-05-06 DIAGNOSIS — Z5111 Encounter for antineoplastic chemotherapy: Secondary | ICD-10-CM

## 2017-05-06 DIAGNOSIS — Z808 Family history of malignant neoplasm of other organs or systems: Secondary | ICD-10-CM

## 2017-05-06 DIAGNOSIS — F17210 Nicotine dependence, cigarettes, uncomplicated: Secondary | ICD-10-CM | POA: Diagnosis not present

## 2017-05-06 LAB — MAGNESIUM: Magnesium: 1.9 mg/dL (ref 1.7–2.4)

## 2017-05-06 LAB — CBC WITH DIFFERENTIAL/PLATELET
Basophils Absolute: 0 10*3/uL (ref 0–0.1)
Basophils Relative: 0 %
Eosinophils Absolute: 0.2 10*3/uL (ref 0–0.7)
Eosinophils Relative: 3 %
HCT: 34.6 % — ABNORMAL LOW (ref 35.0–47.0)
Hemoglobin: 12.1 g/dL (ref 12.0–16.0)
Lymphocytes Relative: 18 %
Lymphs Abs: 1.3 10*3/uL (ref 1.0–3.6)
MCH: 32.4 pg (ref 26.0–34.0)
MCHC: 35.1 g/dL (ref 32.0–36.0)
MCV: 92.4 fL (ref 80.0–100.0)
Monocytes Absolute: 0.4 10*3/uL (ref 0.2–0.9)
Monocytes Relative: 5 %
Neutro Abs: 5.6 10*3/uL (ref 1.4–6.5)
Neutrophils Relative %: 74 %
Platelets: 182 10*3/uL (ref 150–440)
RBC: 3.74 MIL/uL — ABNORMAL LOW (ref 3.80–5.20)
RDW: 20 % — ABNORMAL HIGH (ref 11.5–14.5)
WBC: 7.6 10*3/uL (ref 3.6–11.0)

## 2017-05-06 LAB — COMPREHENSIVE METABOLIC PANEL
ALT: 25 U/L (ref 14–54)
AST: 32 U/L (ref 15–41)
Albumin: 4 g/dL (ref 3.5–5.0)
Alkaline Phosphatase: 112 U/L (ref 38–126)
Anion gap: 5 (ref 5–15)
BUN: 10 mg/dL (ref 6–20)
CO2: 25 mmol/L (ref 22–32)
Calcium: 9 mg/dL (ref 8.9–10.3)
Chloride: 106 mmol/L (ref 101–111)
Creatinine, Ser: 0.83 mg/dL (ref 0.44–1.00)
GFR calc Af Amer: >60 mL/min (ref >60)
GFR calc non Af Amer: >60 mL/min (ref >60)
Glucose, Bld: 123 mg/dL — ABNORMAL HIGH (ref 65–99)
Potassium: 4 mmol/L (ref 3.5–5.1)
Sodium: 136 mmol/L (ref 135–145)
Total Bilirubin: 0.3 mg/dL (ref 0.3–1.2)
Total Protein: 6.6 g/dL (ref 6.5–8.1)

## 2017-05-06 MED ORDER — PALONOSETRON HCL INJECTION 0.25 MG/5ML
0.2500 mg | Freq: Once | INTRAVENOUS | Status: AC
  Administered 2017-05-06: 0.25 mg via INTRAVENOUS
  Filled 2017-05-06: qty 5

## 2017-05-06 MED ORDER — ATROPINE SULFATE 1 MG/ML IJ SOLN
0.5000 mg | Freq: Once | INTRAMUSCULAR | Status: AC | PRN
  Administered 2017-05-06: 0.5 mg via INTRAVENOUS
  Filled 2017-05-06: qty 1

## 2017-05-06 MED ORDER — DEXAMETHASONE SODIUM PHOSPHATE 10 MG/ML IJ SOLN
10.0000 mg | Freq: Once | INTRAMUSCULAR | Status: AC
  Administered 2017-05-06: 10 mg via INTRAVENOUS
  Filled 2017-05-06: qty 1

## 2017-05-06 MED ORDER — HEPARIN SOD (PORK) LOCK FLUSH 100 UNIT/ML IV SOLN: 500.0000 [IU] | Freq: Once | INTRAVENOUS | Status: DC

## 2017-05-06 MED ORDER — SODIUM CHLORIDE 0.9% FLUSH
10.0000 mL | Freq: Once | INTRAVENOUS | Status: AC
  Administered 2017-05-06: 10 mL via INTRAVENOUS
  Filled 2017-05-06: qty 10

## 2017-05-06 MED ORDER — HYDROCODONE-ACETAMINOPHEN 7.5-300 MG PO TABS: 1.0000 | ORAL_TABLET | Freq: Four times a day (QID) | ORAL | 0 refills | Status: DC | PRN

## 2017-05-06 MED ORDER — SODIUM CHLORIDE 0.9 % IV SOLN
Freq: Once | INTRAVENOUS | Status: AC
  Administered 2017-05-06: 10:00:00 via INTRAVENOUS
  Filled 2017-05-06: qty 1000

## 2017-05-06 MED ORDER — SODIUM CHLORIDE 0.9 % IV SOLN
2400.0000 mg/m2 | INTRAVENOUS | Status: DC
  Administered 2017-05-06: 4750 mg via INTRAVENOUS
  Filled 2017-05-06: qty 95

## 2017-05-06 MED ORDER — IRINOTECAN HCL CHEMO INJECTION 100 MG/5ML
180.0000 mg/m2 | Freq: Once | INTRAVENOUS | Status: AC
  Administered 2017-05-06: 360 mg via INTRAVENOUS
  Filled 2017-05-06: qty 16

## 2017-05-06 MED ORDER — LEUCOVORIN CALCIUM INJECTION 350 MG
800.0000 mg | Freq: Once | INTRAVENOUS | Status: AC
  Administered 2017-05-06: 800 mg via INTRAVENOUS
  Filled 2017-05-06: qty 35

## 2017-05-06 MED ORDER — FLUOROURACIL CHEMO INJECTION 2.5 GM/50ML
400.0000 mg/m2 | Freq: Once | INTRAVENOUS | Status: AC
  Administered 2017-05-06: 800 mg via INTRAVENOUS
  Filled 2017-05-06: qty 16

## 2017-05-06 NOTE — Progress Notes (Signed)
[facility] day:  05/06/2017    Chief Complaint: Tamara Hall is a 61 y.o. female with stage IV rectal cancer who is seen for review of scans and assessment prior to cycle #4 FOLFIRI chemotherapy.  HPI:  The patient was last seen in the medical oncology clinic on 04/22/2017.  At that time, she had nausea, but was eating and gaining weight.  She received cycle #3 FOLFIRI.  She was scheduled to have a follow-up CT scan.  She notes nausea for 3-4 days after chemotherapy. She is alternating Ativan and ondansetron. She states that she tried to do the CT scan, but oral contrast caused emesis. Despite this, she has gained weight. She has had no further rectal bleeding.   Past Medical History:  Diagnosis Date  . Cancer ([place])   . Hypertension   . Pneumonia    20+ years ago    Past Surgical History:  Procedure Laterality Date  . ABDOMINAL HYSTERECTOMY    . BREAST SURGERY    . COLONOSCOPY WITH PROPOFOL N/A 01/14/2017   Procedure: COLONOSCOPY WITH PROPOFOL;  Surgeon: Lucilla Lame, MD;  Location: [facility] ENDOSCOPY;  Service: Endoscopy;  Laterality: N/A;  . ELBOW SURGERY Right    Has had 4 surgeries on right elbow.  Marland Kitchen HAND SURGERY Right 2008  . JOINT REPLACEMENT    . PORTA CATH INSERTION N/A 01/29/2017   Procedure: Glori Luis Cath Insertion;  Surgeon: Algernon Huxley, MD;  Location: [facility];  Service: Cardiovascular;  Laterality: N/A;  . TONSILLECTOMY      Family History  Problem Relation Age of Onset  . Lung cancer Mother   . Lung cancer Father   . Brain cancer Maternal Grandmother     Social History:  reports that she has been smoking Cigarettes.  She has a 80.00 pack-year smoking history. She has never used smokeless tobacco. She reports that she does not drink alcohol or use drugs.  She lives in [place].  She has 3 children (ages 6, 1, and 50).  She smokes 2 packs a day.  She started smoking at age 49.  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  [name] is her significant other.  She is alone today.  Allergies: No Known Allergies  Current Medications: Current Outpatient Prescriptions  Medication Sig Dispense Refill  . acetaminophen (TYLENOL) 325 MG tablet Take 2 tablets (650 mg total) by mouth every 6 (six) hours as needed for mild pain (or Fever >/= 101). (Patient taking differently: Take 650 mg by mouth 2 (two) times daily as needed for mild pain (or Fever >/= 101). )    . busPIRone (BUSPAR) 7.5 MG tablet Take 7.5 mg by mouth.    . Hydrocodone-Acetaminophen 7.5-300 MG TABS Take 1 tablet by mouth every 6 (six) hours as needed (pain). 60 each 0  . lidocaine-prilocaine (EMLA) cream Apply 1 application topically as needed. Apply small amount to port site at least 1 hour prior to it being accessed, cover with plastic wrap 30 g 1  . LORazepam (ATIVAN) 0.5 MG tablet Take 1 tablet (0.5 mg total) by mouth every 6 (six) hours as needed. For nausea or vomiting 30 tablet 1  . Magnesium Hydroxide (MILK OF MAGNESIA PO) Take 30 mLs by mouth at bedtime.     . ondansetron (ZOFRAN) 8 MG tablet Take 1 tablet (8 mg total) by mouth every 8 (eight)  hours as needed. 30 tablet 2  . polyethylene glycol (MIRALAX / GLYCOLAX) packet Take by mouth.     No current facility-administered medications for this visit.    Facility-Administered Medications Ordered in Other Visits  Medication Dose Route Frequency Provider Last Rate Last Dose  . heparin lock flush 100 unit/mL  500 Units Intravenous Once Lequita Asal, MD        Review of Systems:  GENERAL:  Feels "ok".  No fevers or sweats.  Weight up 2 pounds. PERFORMANCE STATUS (ECOG):  1 HEENT:  No visual changes, runny nose, sore throat, mouth sores or tenderness. Lungs: No shortness of breath or cough.  No hemoptysis. Cardiac:  No chest pain, palpitations, orthopnea, or PND.  Elevated BP due to anxiety. GI:  Eating.  Nausea controlled with Ativan  and ondansetron.  No vomiting, constipation or melena.  No further rectal bleeding. GU:  No urgency, frequency, dysuria, or hematuria. Musculoskeletal:  No back pain.  No joint pain.  No muscle tenderness. Extremities:  No pain or swelling. Skin:  No rashes or skin changes. Neuro:  No headache, numbness or weakness, balance or coordination issues. Endocrine:  No diabetes, thyroid issues, hot flashes or night sweats. Psych:  Stress/anxiety.  No depression. Pain: Rectal pain (3 out of 10). Review of systems:  All other systems reviewed and found to be negative.  Physical Exam: Blood pressure 120/75, pulse 77, temperature (!) 95.5 F (35.3 C), temperature source Tympanic, resp. rate 18, weight 186 lb 5 oz (84.5 kg). GENERAL:  Well developed, well nourished, woman sitting comfortably in the oncology clinic in no acute distress.   MENTAL STATUS:  Alert and oriented to person, place and time. HEAD:Long grayhair in a pony tail. Normocephalic, atraumatic, face symmetric, no Cushingoid features. EYES:Blueeyes. Pupils equal round and reactive to light and accomodation. No conjunctivitis or scleral icterus. XNT:ZGYFVCBSWH clear without lesion. Tonguenormal. Mucous membranes moist. RESPIRATORY:Clear to auscultationwithout rales, wheezes or rhonchi. CARDIOVASCULAR:Regular rate andrhythmwithout murmur, rub or gallop. ABDOMEN:Soft, non-tender with active bowel soundsand no hepatosplenomegaly. No masses. SKIN: No rashes, ulcers or lesions. EXTREMITIES: No edema, no skin discoloration or tenderness. No palpable cords. LYMPHNODES: No palpable cervical, supraclavicular, axillary or inguinal adenopathy  NEUROLOGICAL: Unremarkable. PSYCH: Appropriate.   Infusion on 05/06/2017  Component Date Value Ref Range Status  . Sodium 05/06/2017 136  135 - 145 mmol/L Final  . Potassium 05/06/2017 4.0  3.5 - 5.1 mmol/L Final  . Chloride 05/06/2017 106  101 - 111 mmol/L Final  . CO2  05/06/2017 25  22 - 32 mmol/L Final  . Glucose, Bld 05/06/2017 123* 65 - 99 mg/dL Final  . BUN 05/06/2017 10  6 - 20 mg/dL Final  . Creatinine, Ser 05/06/2017 0.83  0.44 - 1.00 mg/dL Final  . Calcium 05/06/2017 9.0  8.9 - 10.3 mg/dL Final  . Total Protein 05/06/2017 6.6  6.5 - 8.1 g/dL Final  . Albumin 05/06/2017 4.0  3.5 - 5.0 g/dL Final  . AST 05/06/2017 32  15 - 41 U/L Final  . ALT 05/06/2017 25  14 - 54 U/L Final  . Alkaline Phosphatase 05/06/2017 112  38 - 126 U/L Final  . Total Bilirubin 05/06/2017 0.3  0.3 - 1.2 mg/dL Final  . GFR calc non Af Amer 05/06/2017 >60  >60 mL/min Final  . GFR calc Af Amer 05/06/2017 >60  >60 mL/min Final   Comment: (NOTE) The eGFR has been calculated using the CKD EPI equation. This calculation has not been validated in  all clinical situations. eGFR's persistently <60 mL/min signify possible Chronic Kidney Disease.   . Anion gap 05/06/2017 5  5 - 15 Final  . WBC 05/06/2017 7.6  3.6 - 11.0 K/uL Final  . RBC 05/06/2017 3.74* 3.80 - 5.20 MIL/uL Final  . Hemoglobin 05/06/2017 12.1  12.0 - 16.0 g/dL Final  . HCT 05/06/2017 34.6* 35.0 - 47.0 % Final  . MCV 05/06/2017 92.4  80.0 - 100.0 fL Final  . MCH 05/06/2017 32.4  26.0 - 34.0 pg Final  . MCHC 05/06/2017 35.1  32.0 - 36.0 g/dL Final  . RDW 05/06/2017 20.0* 11.5 - 14.5 % Final  . Platelets 05/06/2017 182  150 - 440 K/uL Final  . Neutrophils Relative % 05/06/2017 74  % Final  . Neutro Abs 05/06/2017 5.6  1.4 - 6.5 K/uL Final  . Lymphocytes Relative 05/06/2017 18  % Final  . Lymphs Abs 05/06/2017 1.3  1.0 - 3.6 K/uL Final  . Monocytes Relative 05/06/2017 5  % Final  . Monocytes Absolute 05/06/2017 0.4  0.2 - 0.9 K/uL Final  . Eosinophils Relative 05/06/2017 3  % Final  . Eosinophils Absolute 05/06/2017 0.2  0 - 0.7 K/uL Final  . Basophils Relative 05/06/2017 0  % Final  . Basophils Absolute 05/06/2017 0.0  0 - 0.1 K/uL Final  . Magnesium 05/06/2017 1.9  1.7 - 2.4 mg/dL Final    Assessment:  TOMEKA KANTNER is a 61 y.o. female with clinical stage T3N1M1 rectal cancer.   She presented with a 9 month history of rectal bleeding.  Colonoscopy on 01/14/2017 revealed a frond-like/villous non-obstructing large mass in the rectum. The mass was non-circumferential.  Biopsies revealed high grade dysplasia within an adenoma (? sampling error).  CEA was 31.6 on 01/15/2017.  Abdomen and pelvic CT on 01/13/2017 revealed irregular thickening of the rectum suspicious for carcinoma.  There was a 1.6 cm hypoattenuating lesion in the right hepatic lobe worrisome for metastatic disease.  Chest CT on 01/15/2017 revealed a 9 mm noncalcified left lower lobe pulmonary nodule (new).  Pelvic MRI on 01/16/2017 revealed rectal adenocarcinoma T3N1.  There was extension beyond the muscularis propria (17 mm).  There were mesorectal nodes >= 5 mm.  There was adenopathy on the left side of the mesorectum, including a 9 mm lesion.  The distance from the tumor to the anal sphincter was 8 mm.  PET scan on 01/27/2017 revealed intense metabolic activity associated rectal mass consists with primary rectal carcinoma.  There was a hypermetabolic metastatic lesion to the central LEFT hepatic lobe.  There was metabolic activity associated with small LEFT lower lobe nodule which was most consistent with pulmonary metastasis.  Liver MRI on 01/31/2017 revealed a 2.6 x 2.7 x 2.1 cm lesion in segment VIII of the liver near the junction with segment IVa c/w a metastatic lesion.  There was a 1.1 x 0.8 cm suspected metastatic lesion peripherally in segment VI of the liver.  CT [facility] on 02/07/2017 revealed malignant cells c/w metastatic colorectal adenocarcinoma.  Tumor was sent for  MMR/MSI, KRAS/NRAS, and BRAF.  She is scheduled to have liver directed therapy (ablation) at [facility] on 03/19/2017.  CEA has been followed: 31.6 on 01/15/2017, 38.1 on 02/18/2017, 25.1 on 03/18/2017, 11.8 on 04/08/2017, and 9.2 on 04/22/2017.  She is  s/p 2 cycle of FOLFOX  (02/18/2017 - 03/04/2017).  She developed transient cold induced neuropathy.  She developed wheezing and hypoxia with cycle #2 felt secondary to oxaliplatin.  She was treated with oxygen by face mask, Benadryl, hydrocortisone, and nebulized albuterol.    She is s/p 3 cycles of FOLFIRI (03/24/2017 - 04/22/2017) with Neulasta support.  She has had nausea.  Diarrhea is controlled with Imodium.  She has an 80 pack year smoking history.  She is smoking.  Symptomatically, she has nausea associated with chemotherapy. She has had no further rectal bleeding.  Exam is stable.  Plan: 1.  Labs today: CBC with diff, CMP, Mg, CEA. 2.  Cycle #4 FOLFIRI chemotherapy today. 3.  RTC in 2 days for disconnect followed by Neulasta delivered by home health or [facility]. 4.  Reschedule abdomen and pelvic CT for 05/16/2017.   5.  Review plan for 12-16 weeks of chemotherapy followed by concurrent chemotherapy (CI 5FU or Xeloda) + radiation followed by surgery. 6.  Encourage smoking cessation. 7.  RTC in 2 weeks for MD assessment, labs (CBC with diff, CMP, Mg, CEA), review of scans, and +/- cycle #5 FOLFIRI.   Lequita Asal, MD  05/06/2017, 9:12 AM

## 2017-05-06 NOTE — Progress Notes (Signed)
Patient states she was unable to tolerate the contrast she had to drink for her CT scan.  States everytime she drank it she threw it right back up.  She called and cancelled the scan.  Patient requesting refill for Hydrocodone.

## 2017-05-07 ENCOUNTER — Other Ambulatory Visit: Payer: Self-pay | Admitting: Hematology and Oncology

## 2017-05-07 LAB — CEA: CEA: 9 ng/mL — ABNORMAL HIGH (ref 0.0–4.7)

## 2017-05-08 ENCOUNTER — Inpatient Hospital Stay: Payer: Managed Care, Other (non HMO) | Attending: Hematology and Oncology

## 2017-05-08 VITALS — BP 149/79 | HR 84 | Temp 97.0°F | Resp 18

## 2017-05-08 DIAGNOSIS — Z7689 Persons encountering health services in other specified circumstances: Secondary | ICD-10-CM | POA: Diagnosis not present

## 2017-05-08 DIAGNOSIS — C20 Malignant neoplasm of rectum: Secondary | ICD-10-CM | POA: Diagnosis not present

## 2017-05-08 DIAGNOSIS — Z79899 Other long term (current) drug therapy: Secondary | ICD-10-CM | POA: Insufficient documentation

## 2017-05-08 MED ORDER — PEGFILGRASTIM INJECTION 6 MG/0.6ML ~~LOC~~
6.0000 mg | PREFILLED_SYRINGE | Freq: Once | SUBCUTANEOUS | Status: AC
  Administered 2017-05-08: 6 mg via SUBCUTANEOUS
  Filled 2017-05-08: qty 0.6

## 2017-05-08 MED ORDER — HEPARIN SOD (PORK) LOCK FLUSH 100 UNIT/ML IV SOLN
500.0000 [IU] | Freq: Once | INTRAVENOUS | Status: AC | PRN
  Administered 2017-05-08: 500 [IU]
  Filled 2017-05-08: qty 5

## 2017-05-08 MED ORDER — SODIUM CHLORIDE 0.9% FLUSH
10.0000 mL | INTRAVENOUS | Status: DC | PRN
  Administered 2017-05-08: 10 mL
  Filled 2017-05-08: qty 10

## 2017-05-16 ENCOUNTER — Ambulatory Visit
Admission: RE | Admit: 2017-05-16 | Discharge: 2017-05-16 | Disposition: A | Discharge status: Home / Self Care | Payer: Managed Care, Other (non HMO) | Source: Ambulatory Visit | Attending: Hematology and Oncology | Admitting: Hematology and Oncology

## 2017-05-16 DIAGNOSIS — C787 Secondary malignant neoplasm of liver and intrahepatic bile duct: Secondary | ICD-10-CM | POA: Diagnosis present

## 2017-05-16 DIAGNOSIS — C20 Malignant neoplasm of rectum: Secondary | ICD-10-CM | POA: Diagnosis present

## 2017-05-16 DIAGNOSIS — I714 Abdominal aortic aneurysm, without rupture: Secondary | ICD-10-CM | POA: Insufficient documentation

## 2017-05-16 DIAGNOSIS — R911 Solitary pulmonary nodule: Secondary | ICD-10-CM | POA: Diagnosis not present

## 2017-05-16 DIAGNOSIS — Z5111 Encounter for antineoplastic chemotherapy: Secondary | ICD-10-CM

## 2017-05-16 MED ORDER — IOPAMIDOL (ISOVUE-300) INJECTION 61%
100.0000 mL | Freq: Once | INTRAVENOUS | Status: AC | PRN
  Administered 2017-05-16: 100 mL via INTRAVENOUS

## 2017-05-20 ENCOUNTER — Telehealth: Payer: Self-pay | Admitting: Hematology and Oncology

## 2017-05-20 ENCOUNTER — Inpatient Hospital Stay: Payer: Managed Care, Other (non HMO)

## 2017-05-20 ENCOUNTER — Telehealth: Payer: Self-pay | Admitting: Pharmacist

## 2017-05-20 ENCOUNTER — Inpatient Hospital Stay (HOSPITAL_BASED_OUTPATIENT_CLINIC_OR_DEPARTMENT_OTHER): Payer: Managed Care, Other (non HMO) | Admitting: Hematology and Oncology

## 2017-05-20 ENCOUNTER — Other Ambulatory Visit: Payer: Self-pay | Admitting: Hematology and Oncology

## 2017-05-20 ENCOUNTER — Encounter: Payer: Self-pay | Admitting: Hematology and Oncology

## 2017-05-20 VITALS — BP 146/76 | HR 85 | Temp 97.0°F | Resp 18 | Wt 186.4 lb

## 2017-05-20 DIAGNOSIS — Z7689 Persons encountering health services in other specified circumstances: Secondary | ICD-10-CM | POA: Diagnosis not present

## 2017-05-20 DIAGNOSIS — C20 Malignant neoplasm of rectum: Secondary | ICD-10-CM

## 2017-05-20 DIAGNOSIS — C787 Secondary malignant neoplasm of liver and intrahepatic bile duct: Secondary | ICD-10-CM

## 2017-05-20 DIAGNOSIS — Z7189 Other specified counseling: Secondary | ICD-10-CM

## 2017-05-20 DIAGNOSIS — Z79899 Other long term (current) drug therapy: Secondary | ICD-10-CM

## 2017-05-20 DIAGNOSIS — Z5111 Encounter for antineoplastic chemotherapy: Secondary | ICD-10-CM

## 2017-05-20 DIAGNOSIS — C7802 Secondary malignant neoplasm of left lung: Secondary | ICD-10-CM

## 2017-05-20 LAB — COMPREHENSIVE METABOLIC PANEL
ALT: 23 U/L (ref 14–54)
AST: 29 U/L (ref 15–41)
Albumin: 3.9 g/dL (ref 3.5–5.0)
Alkaline Phosphatase: 119 U/L (ref 38–126)
Anion gap: 6 (ref 5–15)
BUN: 8 mg/dL (ref 6–20)
CO2: 26 mmol/L (ref 22–32)
Calcium: 8.8 mg/dL — ABNORMAL LOW (ref 8.9–10.3)
Chloride: 107 mmol/L (ref 101–111)
Creatinine, Ser: 0.85 mg/dL (ref 0.44–1.00)
GFR calc Af Amer: >60 mL/min (ref >60)
GFR calc non Af Amer: >60 mL/min (ref >60)
Glucose, Bld: 119 mg/dL — ABNORMAL HIGH (ref 65–99)
Potassium: 3.7 mmol/L (ref 3.5–5.1)
Sodium: 139 mmol/L (ref 135–145)
Total Bilirubin: 0.5 mg/dL (ref 0.3–1.2)
Total Protein: 6.5 g/dL (ref 6.5–8.1)

## 2017-05-20 LAB — CBC WITH DIFFERENTIAL/PLATELET
Basophils Absolute: 0 10*3/uL (ref 0–0.1)
Basophils Relative: 1 %
Eosinophils Absolute: 0.2 10*3/uL (ref 0–0.7)
Eosinophils Relative: 2 %
HCT: 33.8 % — ABNORMAL LOW (ref 35.0–47.0)
Hemoglobin: 11.9 g/dL — ABNORMAL LOW (ref 12.0–16.0)
Lymphocytes Relative: 15 %
Lymphs Abs: 1.4 10*3/uL (ref 1.0–3.6)
MCH: 33 pg (ref 26.0–34.0)
MCHC: 35.3 g/dL (ref 32.0–36.0)
MCV: 93.6 fL (ref 80.0–100.0)
Monocytes Absolute: 0.4 10*3/uL (ref 0.2–0.9)
Monocytes Relative: 4 %
Neutro Abs: 7.1 10*3/uL — ABNORMAL HIGH (ref 1.4–6.5)
Neutrophils Relative %: 78 %
Platelets: 186 10*3/uL (ref 150–440)
RBC: 3.62 MIL/uL — ABNORMAL LOW (ref 3.80–5.20)
RDW: 20.1 % — ABNORMAL HIGH (ref 11.5–14.5)
WBC: 9.1 10*3/uL (ref 3.6–11.0)

## 2017-05-20 LAB — MAGNESIUM: Magnesium: 1.8 mg/dL (ref 1.7–2.4)

## 2017-05-20 MED ORDER — HEPARIN SOD (PORK) LOCK FLUSH 100 UNIT/ML IV SOLN
500.0000 [IU] | Freq: Once | INTRAVENOUS | Status: AC
  Administered 2017-05-20: 500 [IU] via INTRAVENOUS
  Filled 2017-05-20: qty 5

## 2017-05-20 MED ORDER — CAPECITABINE 150 MG PO TABS: ORAL_TABLET | ORAL | 0 refills | Status: DC

## 2017-05-20 MED ORDER — SODIUM CHLORIDE 0.9% FLUSH
10.0000 mL | INTRAVENOUS | Status: DC | PRN
  Administered 2017-05-20: 10 mL via INTRAVENOUS
  Filled 2017-05-20: qty 10

## 2017-05-20 MED ORDER — CAPECITABINE 500 MG PO TABS: ORAL_TABLET | ORAL | 0 refills | Status: DC

## 2017-05-20 NOTE — Telephone Encounter (Signed)
Oral Oncology Pharmacist Encounter  Received new prescription for Xeloda for the treatment of stage IV rectal cancer in conjunction with radiation, planned duration 5 weeks.  Labs from 05/20/17 assessed, no baseline abnormalities relevant to the start of Xeloda.  Current medication list in Epic reviewed, no DDIs with Xeloda were identified  Prescription has been e-scribed to the [facility] for benefits analysis and approval. PA has been completed.  I spoke with patient in person for overview of new oral chemotherapy medication: Xeloda following her provider visit on 05/20/17   Counseled patient on administration, dosing, side effects, safe handling, and monitoring. Provided patient with education sheet. Patient will take three500 mg tablets and one 150 mg tablet twice a day for 5 days each week during radiation.  Side effects include but not limited to: N/V/D, hand-foot syndrome, and fatigue.    Reviewed with patient importance of keeping a medication schedule and plan for any missed doses.  Tamara Hall voiced understanding and appreciation. All questions answered.  Oral Oncology Clinic will continue to follow for insurance authorization, copayment issues, initial counseling and start date.  Patient knows to call the office with questions or concerns. Oral Oncology Clinic will continue to follow.  Thank you,  Nuala Alpha, PharmD, BCPS 05/20/2017  9:56 AM Oral Oncology Clinic [phone]

## 2017-05-20 NOTE — Progress Notes (Signed)
[facility] day:  05/20/2017    Chief Complaint: Tamara Hall is a 61 y.o. female with stage IV rectal cancer who is seen for review of scans and assessment prior to cycle #5 FOLFIRI chemotherapy.  HPI:  The patient was last seen in the medical oncology clinic on 05/06/2017.  At that time, she had nausea associated with chemotherapy. She had no further rectal bleeding.  She had not had her restaging CT scan (oral contrast caused emesis).  She received cycle #4 FOLFIRI.  Abdomen and pelvic CT scan on 05/16/2017 revealed persistent but decreased asymmetric wall thickening in the rectum.  The medial segment left liver lesion was smaller (2.7 x 2.6 cm to 1.9 x 1.6 cm). A tiny lesion seen in segment VI of the liver on previous MRI was not evident on today's CT scan.  The left lower lobe pulmonary nodule was smaller (9 mm to 6 mm).  There was no new or progressive findings in the abdomen and pelvis.  During the interim, she has done well.  She has some nausea relived by ondansetron and Ativan.  She has mild (3 out of 10) rectal pain.  She denies any rectal bleeding.  She has been anxious about her scans.   Past Medical History:  Diagnosis Date  . Cancer ([place])   . Hypertension   . Pneumonia    20+ years ago    Past Surgical History:  Procedure Laterality Date  . ABDOMINAL HYSTERECTOMY    . BREAST SURGERY    . COLONOSCOPY WITH PROPOFOL N/A 01/14/2017   Procedure: COLONOSCOPY WITH PROPOFOL;  Surgeon: Lucilla Lame, MD;  Location: [facility] ENDOSCOPY;  Service: Endoscopy;  Laterality: N/A;  . ELBOW SURGERY Right    Has had 4 surgeries on right elbow.  Marland Kitchen HAND SURGERY Right 2008  . JOINT REPLACEMENT    . PORTA CATH INSERTION N/A 01/29/2017   Procedure: Glori Luis Cath Insertion;  Surgeon: Algernon Huxley, MD;  Location: [facility];  Service: Cardiovascular;  Laterality: N/A;  . TONSILLECTOMY      Family History  Problem Relation Age of Onset  . Lung  cancer Mother   . Lung cancer Father   . Brain cancer Maternal Grandmother     Social History:  reports that she has been smoking Cigarettes.  She has a 80.00 pack-year smoking history. She has never used smokeless tobacco. She reports that she does not drink alcohol or use drugs.  She lives in [place].  She has 3 children (ages 56, 48, and 61).  She smokes 2 packs a day.  She started smoking at age 84.  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  [name] is her significant other.  She is alone today.  Allergies: No Known Allergies  Current Medications: Current Outpatient Prescriptions  Medication Sig Dispense Refill  . acetaminophen (TYLENOL) 325 MG tablet Take 2 tablets (650 mg total) by mouth every 6 (six) hours as needed for mild pain (or Fever >/= 101). (Patient taking differently: Take 650 mg by mouth 2 (two) times daily as needed for mild pain (or Fever >/= 101). )    . busPIRone (BUSPAR) 7.5 MG tablet Take 7.5 mg by mouth.    . Hydrocodone-Acetaminophen 7.5-300 MG TABS Take 1 tablet by mouth every 6 (six) hours as needed (pain). 60 each 0  . lidocaine-prilocaine (EMLA) cream Apply 1 application topically  as needed. Apply small amount to port site at least 1 hour prior to it being accessed, cover with plastic wrap 30 g 1  . LORazepam (ATIVAN) 0.5 MG tablet Take 1 tablet (0.5 mg total) by mouth every 6 (six) hours as needed. For nausea or vomiting 30 tablet 1  . Magnesium Hydroxide (MILK OF MAGNESIA PO) Take 30 mLs by mouth at bedtime.     . ondansetron (ZOFRAN) 8 MG tablet Take 1 tablet (8 mg total) by mouth every 8 (eight) hours as needed. 30 tablet 2  . capecitabine (XELODA) 150 MG tablet Take one 150 mg pill twice a day for 5 days each week during radiation with three 500 mg tablets 50 tablet 0  . capecitabine (XELODA) 500 MG tablet Take three  500 mg pills twice a day for 5 days each week during radiation with one 150 mg tablet 150  tablet 0   No current facility-administered medications for this visit.     Review of Systems:  GENERAL:  Feels "good".  No fevers or sweats.  Weight stable. PERFORMANCE STATUS (ECOG):  1 HEENT:  No visual changes, runny nose, sore throat, mouth sores or tenderness. Lungs: No shortness of breath or cough.  No hemoptysis. Cardiac:  No chest pain, palpitations, orthopnea, or PND.  Elevated BP due to anxiety. GI:  Eating.  Nausea controlled with Ativan and ondansetron.  No vomiting, constipation or melena.  No further rectal bleeding. GU:  No urgency, frequency, dysuria, or hematuria. Musculoskeletal:  No back pain.  No joint pain.  No muscle tenderness. Extremities:  No pain or swelling. Skin:  No rashes or skin changes. Neuro:  No headache, numbness or weakness, balance or coordination issues. Endocrine:  No diabetes, thyroid issues, hot flashes or night sweats. Psych:  Stress/anxiety.  No depression. Pain: Rectal pain (3 out of 10). Review of systems:  All other systems reviewed and found to be negative.  Physical Exam: Blood pressure (!) 146/76, pulse 85, temperature (!) 97 F (36.1 C), temperature source Tympanic, resp. rate 18, weight 186 lb 7 oz (84.6 kg). GENERAL:  Well developed, well nourished, woman sitting comfortably in the oncology clinic in no acute distress.   MENTAL STATUS:  Alert and oriented to person, place and time. HEAD:Long grayhair in a pony tail. Normocephalic, atraumatic, face symmetric, no Cushingoid features. EYES:Blueeyes. Pupils equal round and reactive to light and accomodation. No conjunctivitis or scleral icterus. XEN:[id number] clear without lesion. Tonguenormal. Mucous membranes moist. RESPIRATORY:Clear to auscultationwithout rales, wheezes or rhonchi. CARDIOVASCULAR:Regular rate andrhythmwithout murmur, rub or gallop. ABDOMEN:Soft, non-tender with active bowel soundsand no hepatosplenomegaly. No masses. SKIN: No rashes,  ulcers or lesions. EXTREMITIES: No edema, no skin discoloration or tenderness. No palpable cords. LYMPHNODES: No palpable cervical, supraclavicular, axillary or inguinal adenopathy  NEUROLOGICAL: Unremarkable. PSYCH: Appropriate.   Infusion on 05/20/2017  Component Date Value Ref Range Status  . WBC 05/20/2017 9.1  3.6 - 11.0 K/uL Final  . RBC 05/20/2017 3.62* 3.80 - 5.20 MIL/uL Final  . Hemoglobin 05/20/2017 11.9* 12.0 - 16.0 g/dL Final  . HCT 05/20/2017 33.8* 35.0 - 47.0 % Final  . MCV 05/20/2017 93.6  80.0 - 100.0 fL Final  . MCH 05/20/2017 33.0  26.0 - 34.0 pg Final  . MCHC 05/20/2017 35.3  32.0 - 36.0 g/dL Final  . RDW 05/20/2017 20.1* 11.5 - 14.5 % Final  . Platelets 05/20/2017 186  150 - 440 K/uL Final  . Neutrophils Relative % 05/20/2017 78  % Final  .  Neutro Abs 05/20/2017 7.1* 1.4 - 6.5 K/uL Final  . Lymphocytes Relative 05/20/2017 15  % Final  . Lymphs Abs 05/20/2017 1.4  1.0 - 3.6 K/uL Final  . Monocytes Relative 05/20/2017 4  % Final  . Monocytes Absolute 05/20/2017 0.4  0.2 - 0.9 K/uL Final  . Eosinophils Relative 05/20/2017 2  % Final  . Eosinophils Absolute 05/20/2017 0.2  0 - 0.7 K/uL Final  . Basophils Relative 05/20/2017 1  % Final  . Basophils Absolute 05/20/2017 0.0  0 - 0.1 K/uL Final  . Sodium 05/20/2017 139  135 - 145 mmol/L Final  . Potassium 05/20/2017 3.7  3.5 - 5.1 mmol/L Final  . Chloride 05/20/2017 107  101 - 111 mmol/L Final  . CO2 05/20/2017 26  22 - 32 mmol/L Final  . Glucose, Bld 05/20/2017 119* 65 - 99 mg/dL Final  . BUN 05/20/2017 8  6 - 20 mg/dL Final  . Creatinine, Ser 05/20/2017 0.85  0.44 - 1.00 mg/dL Final  . Calcium 05/20/2017 8.8* 8.9 - 10.3 mg/dL Final  . Total Protein 05/20/2017 6.5  6.5 - 8.1 g/dL Final  . Albumin 05/20/2017 3.9  3.5 - 5.0 g/dL Final  . AST 05/20/2017 29  15 - 41 U/L Final  . ALT 05/20/2017 23  14 - 54 U/L Final  . Alkaline Phosphatase 05/20/2017 119  38 - 126 U/L Final  . Total Bilirubin 05/20/2017 0.5  0.3 -  1.2 mg/dL Final  . GFR calc non Af Amer 05/20/2017 >60  >60 mL/min Final  . GFR calc Af Amer 05/20/2017 >60  >60 mL/min Final   Comment: (NOTE) The eGFR has been calculated using the CKD EPI equation. This calculation has not been validated in all clinical situations. eGFR's persistently <60 mL/min signify possible Chronic Kidney Disease.   . Anion gap 05/20/2017 6  5 - 15 Final  . CEA 05/20/2017 9.5* 0.0 - 4.7 ng/mL Final   Comment: (NOTE)       Roche ECLIA methodology       Nonsmokers  <3.9                                     Smokers     <5.6 Performed At: [facility] [place], [place] [id number] Lindon Romp MD TM:[id number]   . Magnesium 05/20/2017 1.8  1.7 - 2.4 mg/dL Final    Assessment:  Tamara Hall is a 61 y.o. female with clinical stage T3N1M1 rectal cancer.   She presented with a 9 month history of rectal bleeding.  Colonoscopy on 01/14/2017 revealed a frond-like/villous non-obstructing large mass in the rectum. The mass was non-circumferential.  Biopsies revealed high grade dysplasia within an adenoma (? sampling error).  CEA was 31.6 on 01/15/2017.  Abdomen and pelvic CT on 01/13/2017 revealed irregular thickening of the rectum suspicious for carcinoma.  There was a 1.6 cm hypoattenuating lesion in the right hepatic lobe worrisome for metastatic disease.  Chest CT on 01/15/2017 revealed a 9 mm noncalcified left lower lobe pulmonary nodule (new).  Pelvic MRI on 01/16/2017 revealed rectal adenocarcinoma T3N1.  There was extension beyond the muscularis propria (17 mm).  There were mesorectal nodes >= 5 mm.  There was adenopathy on the left side of the mesorectum, including a 9 mm lesion.  The distance from the tumor to the anal sphincter was 8 mm.  PET scan on 01/27/2017 revealed  intense metabolic activity associated rectal mass consists with primary rectal carcinoma.  There was a hypermetabolic metastatic lesion to the central LEFT hepatic lobe.   There was metabolic activity associated with small LEFT lower lobe nodule which was most consistent with pulmonary metastasis.  Liver MRI on 01/31/2017 revealed a 2.6 x 2.7 x 2.1 cm lesion in segment VIII of the liver near the junction with segment IVa c/w a metastatic lesion.  There was a 1.1 x 0.8 cm suspected metastatic lesion peripherally in segment VI of the liver.  CT guided liver biopsy at [facility] on 02/07/2017 revealed malignant cells c/w metastatic colorectal adenocarcinoma.  Tumor was sent for  MMR/MSI, KRAS/NRAS, and BRAF.  She is scheduled to have liver directed therapy (ablation) at [facility] on 03/19/2017.  CEA has been followed: 31.6 on 01/15/2017, 38.1 on 02/18/2017, 25.1 on 03/18/2017, 11.8 on 04/08/2017, 9.2 on 04/22/2017, and 9.5 on 05/20/2017.  She is s/p 2 cycle of FOLFOX  (02/18/2017 - 03/04/2017).  She developed transient cold induced neuropathy.  She developed wheezing and hypoxia with cycle #2 felt secondary to oxaliplatin.  She was treated with oxygen by face mask, Benadryl, hydrocortisone, and nebulized albuterol.    She is s/p 4 cycles of FOLFIRI (03/24/2017 - 04/22/2017) with Neulasta support.  She has had nausea.  Diarrhea is controlled with Imodium.  Abdomen and pelvic CT on 05/16/2017 revealed persistent but decreased asymmetric wall thickening in the rectum.  The medial segment left liver lesion was smaller (2.7 x 2.6 cm to 1.9 x 1.6 cm). A tiny lesion seen in segment VI of the liver on previous MRI was not evident on today's CT scan.  The left lower lobe pulmonary nodule was smaller (9 mm to 6 mm).  There was no new or progressive findings in the abdomen and pelvis.  She has an 80 pack year smoking history.  She is smoking.  Symptomatically, she has nausea associated with chemotherapy. She has had no further rectal bleeding.  Exam is stable.  Plan: 1.  Labs today: CBC with diff, CMP, Mg, CEA. 2.  Discuss results of CT scan- improvement in metastatic disease.  Discuss  conversation with Dr. Rolla Etienne, colorectal surgeon at [facility].  She prefers to have patient back to her center for directed therapy at lung and liver lesion now.  Patient would then proceed with concurrent Xeloda or 5FU with radiation prior to definitive surgery.  Discuss obtaining preauth for Xeloda.  Patient in agreement with plan. 3.  Patient to be seen by Dr. Cecil Cobbs for lung/liver directed therapy (Dr Cecil Cobbs to arrange). 4.  No chemotherapy today. 5.  Preauth Xeloda 825 mg/m2 BID x 5 weeks with radiation. 6.  Consult radiation oncology 7.  RTC in 2 weeks for MD assessment.   Lequita Asal, MD  05/20/2017

## 2017-05-20 NOTE — Progress Notes (Signed)
Patient here today for CT scan results.  States she is still having rectal pain 3/10.  Having intermittent nausea.  Nothing today.  Otherwise, no complaints.  States she is anxious about her scan results.

## 2017-05-20 NOTE — Telephone Encounter (Addendum)
Oral Oncology Patient Advocate Encounter  Received notification from Christella Scheuermann that prior authorization for Xeloda is required.  PA submitted on CoverMyMeds Key [id number] 150MG  & [id number] 500mg  Status is pending  Oral Oncology Clinic will continue to follow.  [place] Patient Advocate 05/20/2017 11:41 AM

## 2017-05-21 LAB — CEA: CEA: 9.5 ng/mL — ABNORMAL HIGH (ref 0.0–4.7)

## 2017-05-22 ENCOUNTER — Inpatient Hospital Stay: Payer: Managed Care, Other (non HMO)

## 2017-05-23 NOTE — Telephone Encounter (Signed)
Oral Oncology Patient Advocate Encounter  Prior Authorization for Xeloda has been approved. Patient's copay is $0.00. Her insurance will only allow for 30days to be filled at a time, this will cover her first 4 weeks of radiation. She will have to complete a second fill for her last (5th) week of radiation.   Per insurance requirement, the second will will have to go through mail order with her insurance pharmacy. [facility] will send the second will to the specified pharmacy prior to second fill date at that time.   Spoke with patient to let her know the above information.  PA# 150mg  PA # [id number] and 500mg  PA # [id number] Effective dates: 05/23/17 through 05/23/18 we had to do Prior Approval over the phone.   [place] Patient Advocate 05/23/2017 1:17 AM

## 2017-05-27 DIAGNOSIS — C7802 Secondary malignant neoplasm of left lung: Secondary | ICD-10-CM | POA: Insufficient documentation

## 2017-05-28 ENCOUNTER — Other Ambulatory Visit: Payer: Self-pay | Admitting: *Deleted

## 2017-05-28 ENCOUNTER — Ambulatory Visit
Admission: RE | Admit: 2017-05-28 | Discharge: 2017-05-28 | Disposition: A | Discharge status: Home / Self Care | Payer: Managed Care, Other (non HMO) | Source: Ambulatory Visit | Attending: Radiation Oncology | Admitting: Radiation Oncology

## 2017-05-28 ENCOUNTER — Encounter: Payer: Self-pay | Admitting: Radiation Oncology

## 2017-05-28 VITALS — BP 141/70 | HR 82 | Temp 98.2°F | Resp 20 | Wt 186.7 lb

## 2017-05-28 DIAGNOSIS — I10 Essential (primary) hypertension: Secondary | ICD-10-CM | POA: Insufficient documentation

## 2017-05-28 DIAGNOSIS — Z510 Encounter for antineoplastic radiation therapy: Secondary | ICD-10-CM | POA: Insufficient documentation

## 2017-05-28 DIAGNOSIS — F17210 Nicotine dependence, cigarettes, uncomplicated: Secondary | ICD-10-CM | POA: Diagnosis not present

## 2017-05-28 DIAGNOSIS — C20 Malignant neoplasm of rectum: Secondary | ICD-10-CM | POA: Insufficient documentation

## 2017-05-28 DIAGNOSIS — C787 Secondary malignant neoplasm of liver and intrahepatic bile duct: Secondary | ICD-10-CM | POA: Insufficient documentation

## 2017-05-28 DIAGNOSIS — Z8701 Personal history of pneumonia (recurrent): Secondary | ICD-10-CM | POA: Insufficient documentation

## 2017-05-28 DIAGNOSIS — C7800 Secondary malignant neoplasm of unspecified lung: Secondary | ICD-10-CM | POA: Insufficient documentation

## 2017-05-28 DIAGNOSIS — Z5111 Encounter for antineoplastic chemotherapy: Secondary | ICD-10-CM

## 2017-05-28 DIAGNOSIS — Z79899 Other long term (current) drug therapy: Secondary | ICD-10-CM | POA: Diagnosis not present

## 2017-05-28 MED FILL — CAPECITABINE 500 MG TABLET: 500 | 28 days supply | Qty: 125 | Fill #0

## 2017-05-28 NOTE — Consult Note (Signed)
NEW PATIENT EVALUATION  Name: Tamara Hall  MRN: [id number]  Date:   05/28/2017     DOB: 08/10/1956   This 61 y.o. 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  REFERRING PHYSICIAN: Lequita Asal, MD  CHIEF COMPLAINT:  Chief Complaint  Patient presents with  . Rectal Cancer    Inital Evaluation    DIAGNOSIS: The encounter diagnosis was Rectal cancer ([place]).   PREVIOUS INVESTIGATIONS:  MRI scans and CT scans and PET/CT scans reviewed Pathology reports reviewed Clinical notes reviewed  HPI: Patient is a 61 year old female originally presented with a one-year history of rectal bleeding was found to have a low lying rectal mass biopsy positive for high-grade dysplasia. Workup including PET CT scan and review at [facility] demonstrated hypermetabolic mass in the liver which was biopsied with cytology consistent with metastatic adenocarcinoma of the rectum. She also a small not accessible left lung lesion which was hypermetabolic and may be a pulmonary metastasis from her rectal cancer. Tumor was low lying 8 mm from the anus. MRI scan of the pelvis stage this is a T3 N1 lesion. Patient has been on4 cycles of FOLFIRI (03/24/2017 - 04/22/2017) with Neulasta support. Liver lesion has decreased in size as well as slight decrease in the pulmonary lesion. She's been seen at [facility] for surgical resection with recommendation to go ahead with radioablation therapy of her liver metastasis followed by concurrent chemoradiation in a neoadjuvant setting prior to surgical resection of her rectal cancer. Patient is fairly weak and worn out although otherwise is doing well. She's having no significant rectal bleeding diarrhea or abdominal pain at this time. She's referred to radiation oncology for consideration of treatment.    PLANNED TREATMENT REGIMEN: Concurrent chemoradiation prior to surgical resection of rectal cancer  PAST MEDICAL HISTORY:  has a past medical history of Cancer ([place]); Hypertension; and Pneumonia.    PAST SURGICAL HISTORY:  Past Surgical History:  Procedure Laterality Date  . ABDOMINAL HYSTERECTOMY    . BREAST SURGERY    . COLONOSCOPY WITH PROPOFOL N/A 01/14/2017   Procedure: COLONOSCOPY WITH PROPOFOL;  Surgeon: Lucilla Lame, MD;  Location: [facility] ENDOSCOPY;  Service: Endoscopy;  Laterality: N/A;  . ELBOW SURGERY Right    Has had 4 surgeries on right elbow.  Marland Kitchen HAND SURGERY Right 2008  . JOINT REPLACEMENT    . PORTA CATH INSERTION N/A 01/29/2017   Procedure: Glori Luis Cath Insertion;  Surgeon: Algernon Huxley, MD;  Location: [facility];  Service: Cardiovascular;  Laterality: N/A;  . TONSILLECTOMY      FAMILY HISTORY: family history includes Brain cancer in her maternal grandmother; Lung cancer in her father and mother.  SOCIAL HISTORY:  reports that she has been smoking Cigarettes.  She has a 80.00 pack-year smoking history. She has never used smokeless tobacco. She reports that she does not drink alcohol or use drugs.  ALLERGIES: Patient has no known allergies.  MEDICATIONS:  Current Outpatient Prescriptions  Medication Sig Dispense Refill  . acetaminophen (TYLENOL) 325 MG tablet Take 2 tablets (650 mg total) by mouth every 6 (six) hours as needed for mild pain (or Fever >/= 101). (Patient taking differently: Take 650 mg by mouth 2 (two) times daily as needed for mild pain (or Fever >/= 101). )    . busPIRone (BUSPAR) 7.5 MG tablet Take 7.5  mg by mouth.    . capecitabine (XELODA) 150 MG tablet Take one 150 mg pill twice a day for 5 days each week during radiation with three 500 mg tablets 50 tablet 0  . capecitabine (XELODA) 500 MG tablet Take three  500 mg pills twice a day for 5 days each week during radiation with one 150 mg tablet 150 tablet 0  . Hydrocodone-Acetaminophen 7.5-300 MG  TABS Take 1 tablet by mouth every 6 (six) hours as needed (pain). 60 each 0  . lidocaine-prilocaine (EMLA) cream Apply 1 application topically as needed. Apply small amount to port site at least 1 hour prior to it being accessed, cover with plastic wrap 30 g 1  . LORazepam (ATIVAN) 0.5 MG tablet Take 1 tablet (0.5 mg total) by mouth every 6 (six) hours as needed. For nausea or vomiting 30 tablet 1  . Magnesium Hydroxide (MILK OF MAGNESIA PO) Take 30 mLs by mouth at bedtime.     . ondansetron (ZOFRAN) 8 MG tablet Take 1 tablet (8 mg total) by mouth every 8 (eight) hours as needed. 30 tablet 2   No current facility-administered medications for this encounter.     ECOG PERFORMANCE STATUS:  0 - Asymptomatic  REVIEW OF SYSTEMS: Except for the fatigue Patient denies any weight loss, fatigue, weakness, fever, chills or night sweats. Patient denies any loss of vision, blurred vision. Patient denies any ringing  of the ears or hearing loss. No irregular heartbeat. Patient denies heart murmur or history of fainting. Patient denies any chest pain or pain radiating to her upper extremities. Patient denies any shortness of breath, difficulty breathing at night, cough or hemoptysis. Patient denies any swelling in the lower legs. Patient denies any nausea vomiting, vomiting of blood, or coffee ground material in the vomitus. Patient denies any stomach pain. Patient states has had normal bowel movements no significant constipation or diarrhea. Patient denies any dysuria, hematuria or significant nocturia. Patient denies any problems walking, swelling in the joints or loss of balance. Patient denies any skin changes, loss of hair or loss of weight. Patient denies any excessive worrying or anxiety or significant depression. Patient denies any problems with insomnia. Patient denies excessive thirst, polyuria, polydipsia. Patient denies any swollen glands, patient denies easy bruising or easy bleeding. Patient denies any  recent infections, allergies or URI. Patient "s visual fields have not changed significantly in recent time.    PHYSICAL EXAM: BP (!) 141/70   Pulse 82   Temp 98.2 F (36.8 C)   Resp 20   Wt 186 lb 11.7 oz (84.7 kg)   BMI 29.25 kg/m  Well-developed well-nourished patient in NAD. HEENT reveals PERLA, EOMI, discs not visualized.  Oral cavity is clear. No oral mucosal lesions are identified. Neck is clear without evidence of cervical or supraclavicular adenopathy. Lungs are clear to A&P. Cardiac examination is essentially unremarkable with regular rate and rhythm without murmur rub or thrill. Abdomen is benign with no organomegaly or masses noted. Motor sensory and DTR levels are equal and symmetric in the upper and lower extremities. Cranial nerves II through XII are grossly intact. Proprioception is intact. No peripheral adenopathy or edema is identified. No motor or sensory levels are noted. Crude visual fields are within normal range.  LABORATORY DATA: Cytology and pathology reports from both [facility] and [facility] are reviewed and compatible with the above-stated findings    RADIOLOGY RESULTS: MRI scans PET/CT scans and CT scans all reviewed and compatible above-stated findings  IMPRESSION: Patient with stage for rectal cancer with response to chemotherapy for concurrent chemoradiation prior to surgical resection of her rectal cancer as well as radiofrequency ablation of her liver metastasis in 61 year old female  PLAN: At this time she is scheduled next 2 weeks for her radiofrequency ablation at [facility]. Within start concurrent chemoradiation. Would plan on delivering 4500 cGy to her pelvis boosting the area of primary tumor involvement another 540 cGy prior to surgical resection. Risks and benefits of treatment including possible diarrhea possible increased lower urinary tract symptoms fatigue alteration of blood counts skin reaction all were discussed in detail with the patient. She seems to  comprehend my treatment plan well. I've put office CT simulation till after her ablation and given appointment in about 2 weeks for that procedure. I also discussed with medical oncology plans for her lung lesion although the small size and slight hypermetabolic activity in this lesion we can follow at this time and possibly due SB RT in the future should this progress.  I would like to take this opportunity to thank you for allowing me to participate in the care of your patient.Armstead Peaks., MD

## 2017-05-29 MED ORDER — HYDROCODONE-ACETAMINOPHEN 7.5-300 MG PO TABS: 1.0000 | ORAL_TABLET | Freq: Four times a day (QID) | ORAL | 0 refills | Status: DC | PRN

## 2017-05-29 MED FILL — CAPECITABINE 150 MG TABS: 150 | 28 days supply | Qty: 40 | Fill #0

## 2017-06-03 ENCOUNTER — Ambulatory Visit: Payer: Managed Care, Other (non HMO) | Admitting: Hematology and Oncology

## 2017-06-10 ENCOUNTER — Ambulatory Visit
Admission: RE | Admit: 2017-06-10 | Discharge: 2017-06-10 | Disposition: A | Discharge status: Home / Self Care | Payer: Managed Care, Other (non HMO) | Source: Ambulatory Visit | Attending: Radiation Oncology | Admitting: Radiation Oncology

## 2017-06-10 DIAGNOSIS — C20 Malignant neoplasm of rectum: Secondary | ICD-10-CM | POA: Diagnosis not present

## 2017-06-12 DIAGNOSIS — C20 Malignant neoplasm of rectum: Secondary | ICD-10-CM | POA: Diagnosis not present

## 2017-06-13 NOTE — Telephone Encounter (Signed)
Oral Oncology Patient Advocate Encounter   Called Tamara Hall to make sure they got our faxed prescription. They did get it and it will be shipped today to patients home. This is her 5 of 5. We were able to process the first four weeks at [facility].    [place] Patient Advocate [phone] 06/13/2017 10:05 AM

## 2017-06-16 ENCOUNTER — Other Ambulatory Visit: Payer: Self-pay | Admitting: *Deleted

## 2017-06-16 ENCOUNTER — Telehealth: Payer: Self-pay | Admitting: *Deleted

## 2017-06-16 DIAGNOSIS — C20 Malignant neoplasm of rectum: Secondary | ICD-10-CM

## 2017-06-16 DIAGNOSIS — C787 Secondary malignant neoplasm of liver and intrahepatic bile duct: Secondary | ICD-10-CM

## 2017-06-16 DIAGNOSIS — Z5111 Encounter for antineoplastic chemotherapy: Secondary | ICD-10-CM

## 2017-06-16 MED ORDER — HYDROCODONE-ACETAMINOPHEN 7.5-300 MG PO TABS: 1.0000 | ORAL_TABLET | Freq: Four times a day (QID) | ORAL | 0 refills | Status: DC | PRN

## 2017-06-16 NOTE — Telephone Encounter (Signed)
Erroneous entry

## 2017-06-17 ENCOUNTER — Telehealth: Payer: Self-pay | Admitting: Pharmacist

## 2017-06-17 ENCOUNTER — Other Ambulatory Visit: Payer: Self-pay | Admitting: Hematology and Oncology

## 2017-06-17 ENCOUNTER — Ambulatory Visit
Admission: RE | Admit: 2017-06-17 | Discharge: 2017-06-17 | Disposition: A | Discharge status: Home / Self Care | Payer: Managed Care, Other (non HMO) | Source: Ambulatory Visit | Attending: Radiation Oncology | Admitting: Radiation Oncology

## 2017-06-17 DIAGNOSIS — C20 Malignant neoplasm of rectum: Secondary | ICD-10-CM | POA: Diagnosis not present

## 2017-06-17 NOTE — Telephone Encounter (Signed)
Oral Chemotherapy Pharmacist Encounter   Attempted to reach patient for follow up on oral medication: Xeloda. Patient started radiation today. No answer. Left VM for patient to call back with any questions or issues.   Darl Pikes, PharmD, BCPS Hematology/Oncology Clinical Pharmacist [facility] [place] [facility] [phone]  06/17/2017 3:31 PM

## 2017-06-18 ENCOUNTER — Ambulatory Visit
Admission: RE | Admit: 2017-06-18 | Discharge: 2017-06-18 | Disposition: A | Discharge status: Home / Self Care | Payer: Managed Care, Other (non HMO) | Source: Ambulatory Visit | Attending: Radiation Oncology | Admitting: Radiation Oncology

## 2017-06-18 ENCOUNTER — Encounter: Payer: Self-pay | Admitting: Pharmacist

## 2017-06-18 ENCOUNTER — Inpatient Hospital Stay: Payer: Managed Care, Other (non HMO) | Attending: Hematology and Oncology

## 2017-06-18 DIAGNOSIS — R197 Diarrhea, unspecified: Secondary | ICD-10-CM | POA: Insufficient documentation

## 2017-06-18 DIAGNOSIS — C787 Secondary malignant neoplasm of liver and intrahepatic bile duct: Secondary | ICD-10-CM | POA: Insufficient documentation

## 2017-06-18 DIAGNOSIS — Z923 Personal history of irradiation: Secondary | ICD-10-CM | POA: Insufficient documentation

## 2017-06-18 DIAGNOSIS — F17210 Nicotine dependence, cigarettes, uncomplicated: Secondary | ICD-10-CM | POA: Insufficient documentation

## 2017-06-18 DIAGNOSIS — G629 Polyneuropathy, unspecified: Secondary | ICD-10-CM | POA: Insufficient documentation

## 2017-06-18 DIAGNOSIS — R0902 Hypoxemia: Secondary | ICD-10-CM | POA: Insufficient documentation

## 2017-06-18 DIAGNOSIS — R1031 Right lower quadrant pain: Secondary | ICD-10-CM | POA: Insufficient documentation

## 2017-06-18 DIAGNOSIS — Z800 Family history of malignant neoplasm of digestive organs: Secondary | ICD-10-CM | POA: Insufficient documentation

## 2017-06-18 DIAGNOSIS — Z9221 Personal history of antineoplastic chemotherapy: Secondary | ICD-10-CM | POA: Insufficient documentation

## 2017-06-18 DIAGNOSIS — Z8701 Personal history of pneumonia (recurrent): Secondary | ICD-10-CM | POA: Insufficient documentation

## 2017-06-18 DIAGNOSIS — R0789 Other chest pain: Secondary | ICD-10-CM | POA: Insufficient documentation

## 2017-06-18 DIAGNOSIS — K30 Functional dyspepsia: Secondary | ICD-10-CM | POA: Insufficient documentation

## 2017-06-18 DIAGNOSIS — Z79899 Other long term (current) drug therapy: Secondary | ICD-10-CM | POA: Insufficient documentation

## 2017-06-18 DIAGNOSIS — C20 Malignant neoplasm of rectum: Secondary | ICD-10-CM | POA: Diagnosis not present

## 2017-06-18 DIAGNOSIS — Z801 Family history of malignant neoplasm of trachea, bronchus and lung: Secondary | ICD-10-CM | POA: Insufficient documentation

## 2017-06-18 DIAGNOSIS — I10 Essential (primary) hypertension: Secondary | ICD-10-CM | POA: Insufficient documentation

## 2017-06-18 NOTE — Progress Notes (Signed)
Oral Chemotherapy Pharmacist Encounter  Spoke with patient face to face prior to her first radiation appt today. Reviewed the Xeloda administration instructions with Tamara Hall. She reported starting her medication this morning.   Xeloda start date: 06/18/17  She is doing well so far and knows to call me with any issues.  Thank you,  Darl Pikes, PharmD, BCPS Hematology/Oncology Clinical Pharmacist [facility] [place] [phone]  06/18/2017 10:57 AM

## 2017-06-19 ENCOUNTER — Ambulatory Visit
Admission: RE | Admit: 2017-06-19 | Discharge: 2017-06-19 | Disposition: A | Discharge status: Home / Self Care | Payer: Managed Care, Other (non HMO) | Source: Ambulatory Visit | Attending: Radiation Oncology | Admitting: Radiation Oncology

## 2017-06-19 ENCOUNTER — Inpatient Hospital Stay: Payer: Managed Care, Other (non HMO)

## 2017-06-19 ENCOUNTER — Inpatient Hospital Stay (HOSPITAL_BASED_OUTPATIENT_CLINIC_OR_DEPARTMENT_OTHER): Payer: Managed Care, Other (non HMO) | Admitting: Hematology and Oncology

## 2017-06-19 ENCOUNTER — Other Ambulatory Visit: Payer: Self-pay | Admitting: *Deleted

## 2017-06-19 VITALS — BP 131/68 | HR 82 | Temp 96.0°F | Resp 18 | Wt 187.2 lb

## 2017-06-19 DIAGNOSIS — Z801 Family history of malignant neoplasm of trachea, bronchus and lung: Secondary | ICD-10-CM | POA: Diagnosis not present

## 2017-06-19 DIAGNOSIS — Z79899 Other long term (current) drug therapy: Secondary | ICD-10-CM

## 2017-06-19 DIAGNOSIS — C787 Secondary malignant neoplasm of liver and intrahepatic bile duct: Secondary | ICD-10-CM

## 2017-06-19 DIAGNOSIS — I10 Essential (primary) hypertension: Secondary | ICD-10-CM

## 2017-06-19 DIAGNOSIS — R1031 Right lower quadrant pain: Secondary | ICD-10-CM

## 2017-06-19 DIAGNOSIS — R0789 Other chest pain: Secondary | ICD-10-CM | POA: Diagnosis not present

## 2017-06-19 DIAGNOSIS — C20 Malignant neoplasm of rectum: Secondary | ICD-10-CM | POA: Diagnosis not present

## 2017-06-19 DIAGNOSIS — K30 Functional dyspepsia: Secondary | ICD-10-CM | POA: Diagnosis not present

## 2017-06-19 DIAGNOSIS — Z9221 Personal history of antineoplastic chemotherapy: Secondary | ICD-10-CM | POA: Diagnosis not present

## 2017-06-19 DIAGNOSIS — R0902 Hypoxemia: Secondary | ICD-10-CM

## 2017-06-19 DIAGNOSIS — R197 Diarrhea, unspecified: Secondary | ICD-10-CM | POA: Diagnosis not present

## 2017-06-19 DIAGNOSIS — Z7189 Other specified counseling: Secondary | ICD-10-CM

## 2017-06-19 DIAGNOSIS — G629 Polyneuropathy, unspecified: Secondary | ICD-10-CM

## 2017-06-19 DIAGNOSIS — F17210 Nicotine dependence, cigarettes, uncomplicated: Secondary | ICD-10-CM | POA: Diagnosis not present

## 2017-06-19 DIAGNOSIS — Z923 Personal history of irradiation: Secondary | ICD-10-CM | POA: Diagnosis not present

## 2017-06-19 DIAGNOSIS — Z800 Family history of malignant neoplasm of digestive organs: Secondary | ICD-10-CM | POA: Diagnosis not present

## 2017-06-19 DIAGNOSIS — Z8701 Personal history of pneumonia (recurrent): Secondary | ICD-10-CM | POA: Diagnosis not present

## 2017-06-19 DIAGNOSIS — C7802 Secondary malignant neoplasm of left lung: Secondary | ICD-10-CM

## 2017-06-19 LAB — COMPREHENSIVE METABOLIC PANEL
ALT: 14 U/L (ref 14–54)
AST: 20 U/L (ref 15–41)
Albumin: 3.8 g/dL (ref 3.5–5.0)
Alkaline Phosphatase: 91 U/L (ref 38–126)
Anion gap: 8 (ref 5–15)
BUN: 7 mg/dL (ref 6–20)
CO2: 25 mmol/L (ref 22–32)
Calcium: 8.8 mg/dL — ABNORMAL LOW (ref 8.9–10.3)
Chloride: 103 mmol/L (ref 101–111)
Creatinine, Ser: 0.71 mg/dL (ref 0.44–1.00)
GFR calc Af Amer: >60 mL/min (ref >60)
GFR calc non Af Amer: >60 mL/min (ref >60)
Glucose, Bld: 108 mg/dL — ABNORMAL HIGH (ref 65–99)
Potassium: 4 mmol/L (ref 3.5–5.1)
Sodium: 136 mmol/L (ref 135–145)
Total Bilirubin: 0.3 mg/dL (ref 0.3–1.2)
Total Protein: 6.7 g/dL (ref 6.5–8.1)

## 2017-06-19 LAB — CBC WITH DIFFERENTIAL/PLATELET
Basophils Absolute: 0 10*3/uL (ref 0–0.1)
Basophils Relative: 0 %
Eosinophils Absolute: 0.2 10*3/uL (ref 0–0.7)
Eosinophils Relative: 3 %
HCT: 35.3 % (ref 35.0–47.0)
Hemoglobin: 12.6 g/dL (ref 12.0–16.0)
Lymphocytes Relative: 24 %
Lymphs Abs: 1.7 10*3/uL (ref 1.0–3.6)
MCH: 33.5 pg (ref 26.0–34.0)
MCHC: 35.6 g/dL (ref 32.0–36.0)
MCV: 94.1 fL (ref 80.0–100.0)
Monocytes Absolute: 0.5 10*3/uL (ref 0.2–0.9)
Monocytes Relative: 7 %
Neutro Abs: 4.8 10*3/uL (ref 1.4–6.5)
Neutrophils Relative %: 66 %
Platelets: 367 10*3/uL (ref 150–440)
RBC: 3.75 MIL/uL — ABNORMAL LOW (ref 3.80–5.20)
RDW: 15.4 % — ABNORMAL HIGH (ref 11.5–14.5)
WBC: 7.2 10*3/uL (ref 3.6–11.0)

## 2017-06-19 MED ORDER — OMEPRAZOLE 20 MG PO CPDR: 20.0000 mg | DELAYED_RELEASE_CAPSULE | Freq: Every day | ORAL | 0 refills | Status: DC

## 2017-06-19 NOTE — Progress Notes (Signed)
Patient states she started her Xeloda yesterday.  After taking it she had significant indigestion. She took one of her husband's omeprazole tablets and it helped. States it intensified with every pill she took.  Patient would like prescription for omeprazole or other medication that would help.

## 2017-06-19 NOTE — Progress Notes (Signed)
[name] day:  06/19/2017   Chief Complaint: Tamara Hall is a 61 y.o. female with stage IV rectal cancer who is seen for review of scans and assessment during concurrent Xeloda and radiation.  HPI:  The patient was last seen in the medical oncology clinic on 05/20/2017.  At that time, interval CT scans were reviewed.  Metastatic disease had improved.  Dr. Rolla Etienne, colorectal surgeon at [facility] recommended lung and liver directed therapy.  She was then to begin concurrent chemotherapy and radiation prior to planned surgery.  She was seen by Dr. Baruch Gouty of radiation oncology on 05/28/2017.  She is scheduled to receive 4500 cGy to her pelvis boosting the area of primary tumor involvement another 540 cGy prior to surgical resection. Discussion was also held regarding possible SBRT of the lung lesion in the future.  She began radiation on 06/18/2017.  Plans are for 28 treatments  She underwent CT guided microwave ablation of the liver lesion on 06/06/2017.  Symptomatically, patient having LEFT chest wall pain and RIGHT lower quadrant abdominal pain following liver ablation. Patient taking Xeloda as prescribed. Complained of some indigestion after taking the medication; denies other symptoms. Patient with not fevers, sweats, weight loss, or bleeding. She is eating well; no weight loss.    Past Medical History:  Diagnosis Date  . Cancer ([place])   . Hypertension   . Pneumonia    20+ years ago    Past Surgical History:  Procedure Laterality Date  . ABDOMINAL HYSTERECTOMY    . BREAST SURGERY    . COLONOSCOPY WITH PROPOFOL N/A 01/14/2017   Procedure: COLONOSCOPY WITH PROPOFOL;  Surgeon: Lucilla Lame, MD;  Location: [facility] ENDOSCOPY;  Service: Endoscopy;  Laterality: N/A;  . ELBOW SURGERY Right    Has had 4 surgeries on right elbow.  Marland Kitchen HAND SURGERY Right 2008  . JOINT REPLACEMENT    . PORTA CATH INSERTION N/A 01/29/2017   Procedure: Glori Luis Cath  Insertion;  Surgeon: Algernon Huxley, MD;  Location: [facility];  Service: Cardiovascular;  Laterality: N/A;  . TONSILLECTOMY      Family History  Problem Relation Age of Onset  . Lung cancer Mother   . Lung cancer Father   . Brain cancer Maternal Grandmother     Social History:  reports that she has been smoking Cigarettes.  She has a 80.00 pack-year smoking history. She has never used smokeless tobacco. She reports that she does not drink alcohol or use drugs.  She lives in [place].  She has 3 children (ages 25, 9, and 25).  She smokes 2 packs a day.  She started smoking at age 38.  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  [name] is her significant other.  She is alone today.  Allergies: No Known Allergies  Current Medications: Current Outpatient Prescriptions  Medication Sig Dispense Refill  . acetaminophen (TYLENOL) 325 MG tablet Take 2 tablets (650 mg total) by mouth every 6 (six) hours as needed for mild pain (or Fever >/= 101). (Patient taking differently: Take 650 mg by mouth 2 (two) times daily as needed for mild pain (or Fever >/= 101). )    . busPIRone (BUSPAR) 7.5 MG tablet Take 7.5 mg by mouth.    . capecitabine (XELODA) 150 MG tablet Take one 150 mg pill twice a day for 5 days each week during radiation with three  500 mg tablets 50 tablet 0  . capecitabine (XELODA) 500 MG tablet Take three  500 mg pills twice a day for 5 days each week during radiation with one 150 mg tablet 150 tablet 0  . Hydrocodone-Acetaminophen 7.5-300 MG TABS Take 1 tablet by mouth every 6 (six) hours as needed (pain). 60 each 0  . lidocaine-prilocaine (EMLA) cream APPLY A SMALL AMOUNT TO PORT SITE AT LEAST 1 HOUR BEFORE BEING ACCESSED. COVER IN PLASTIC WRAP. 30 g 1  . LORazepam (ATIVAN) 0.5 MG tablet Take 1 tablet (0.5 mg total) by mouth every 6 (six) hours as needed. For nausea or vomiting 30 tablet 1  . Magnesium Hydroxide (MILK OF MAGNESIA  PO) Take 30 mLs by mouth at bedtime.     . ondansetron (ZOFRAN) 8 MG tablet Take 1 tablet (8 mg total) by mouth every 8 (eight) hours as needed. 30 tablet 2   No current facility-administered medications for this visit.     Review of Systems:  GENERAL:  Feels "good".  No fevers or sweats.  Weight stable. PERFORMANCE STATUS (ECOG):  1 HEENT:  No visual changes, runny nose, sore throat, mouth sores or tenderness. Lungs: No shortness of breath or cough.  No hemoptysis. Cardiac:  No chest pain, palpitations, orthopnea, or PND.  Elevated BP due to anxiety. GI:  Eating well.  Nausea controlled with Ativan and ondansetron.  No vomiting, constipation or melena.  No further rectal bleeding. GU:  No urgency, frequency, dysuria, or hematuria. Musculoskeletal:  No back pain.  No joint pain.  No muscle tenderness. Extremities:  No pain or swelling. Skin:  No rashes or skin changes. Neuro:  No headache, numbness or weakness, balance or coordination issues. Endocrine:  No diabetes, thyroid issues, hot flashes or night sweats. Psych:  Stress/anxiety.  No depression. Pain: Rectal pain (3 out of 10). Review of systems:  All other systems reviewed and found to be negative.  Physical Exam: Blood pressure 131/68, pulse 82, temperature (!) 96 F (35.6 C), temperature source Tympanic, resp. rate 18, weight 187 lb 4 oz (84.9 kg). GENERAL:  Well developed, well nourished, woman sitting comfortably in the oncology clinic in no acute distress.   MENTAL STATUS:  Alert and oriented to person, place and time. HEAD:Long grayhair in a pony tail. Normocephalic, atraumatic, face symmetric, no Cushingoid features. EYES:Blueeyes. Pupils equal round and reactive to light and accomodation. No conjunctivitis or scleral icterus. ZHG:DJMEQASTMH clear without lesion. Tonguenormal. Mucous membranes moist. RESPIRATORY:Clear to auscultationwithout rales, wheezes or rhonchi. CARDIOVASCULAR:Regular rate  andrhythmwithout murmur, rub or gallop. ABDOMEN:Tenderness over ablation sites.Soft;  active bowel soundsand no hepatosplenomegaly. No masses. SKIN: No rashes, ulcers or lesions. EXTREMITIES: No edema, no skin discoloration or tenderness. No palpable cords. LYMPHNODES: No palpable cervical, supraclavicular, axillary or inguinal adenopathy  NEUROLOGICAL: Unremarkable. PSYCH: Appropriate.   No visits with results within 3 Day(s) from this visit.  Latest known visit with results is:  Infusion on 05/20/2017  Component Date Value Ref Range Status  . WBC 05/20/2017 9.1  3.6 - 11.0 K/uL Final  . RBC 05/20/2017 3.62* 3.80 - 5.20 MIL/uL Final  . Hemoglobin 05/20/2017 11.9* 12.0 - 16.0 g/dL Final  . HCT 05/20/2017 33.8* 35.0 - 47.0 % Final  . MCV 05/20/2017 93.6  80.0 - 100.0 fL Final  . MCH 05/20/2017 33.0  26.0 - 34.0 pg Final  . MCHC 05/20/2017 35.3  32.0 - 36.0 g/dL Final  . RDW 05/20/2017 20.1* 11.5 - 14.5 % Final  . Platelets 05/20/2017  186  150 - 440 K/uL Final  . Neutrophils Relative % 05/20/2017 78  % Final  . Neutro Abs 05/20/2017 7.1* 1.4 - 6.5 K/uL Final  . Lymphocytes Relative 05/20/2017 15  % Final  . Lymphs Abs 05/20/2017 1.4  1.0 - 3.6 K/uL Final  . Monocytes Relative 05/20/2017 4  % Final  . Monocytes Absolute 05/20/2017 0.4  0.2 - 0.9 K/uL Final  . Eosinophils Relative 05/20/2017 2  % Final  . Eosinophils Absolute 05/20/2017 0.2  0 - 0.7 K/uL Final  . Basophils Relative 05/20/2017 1  % Final  . Basophils Absolute 05/20/2017 0.0  0 - 0.1 K/uL Final  . Sodium 05/20/2017 139  135 - 145 mmol/L Final  . Potassium 05/20/2017 3.7  3.5 - 5.1 mmol/L Final  . Chloride 05/20/2017 107  101 - 111 mmol/L Final  . CO2 05/20/2017 26  22 - 32 mmol/L Final  . Glucose, Bld 05/20/2017 119* 65 - 99 mg/dL Final  . BUN 05/20/2017 8  6 - 20 mg/dL Final  . Creatinine, Ser 05/20/2017 0.85  0.44 - 1.00 mg/dL Final  . Calcium 05/20/2017 8.8* 8.9 - 10.3 mg/dL Final  . Total Protein  05/20/2017 6.5  6.5 - 8.1 g/dL Final  . Albumin 05/20/2017 3.9  3.5 - 5.0 g/dL Final  . AST 05/20/2017 29  15 - 41 U/L Final  . ALT 05/20/2017 23  14 - 54 U/L Final  . Alkaline Phosphatase 05/20/2017 119  38 - 126 U/L Final  . Total Bilirubin 05/20/2017 0.5  0.3 - 1.2 mg/dL Final  . GFR calc non Af Amer 05/20/2017 >60  >60 mL/min Final  . GFR calc Af Amer 05/20/2017 >60  >60 mL/min Final   Comment: (NOTE) The eGFR has been calculated using the CKD EPI equation. This calculation has not been validated in all clinical situations. eGFR's persistently <60 mL/min signify possible Chronic Kidney Disease.   . Anion gap 05/20/2017 6  5 - 15 Final  . CEA 05/20/2017 9.5* 0.0 - 4.7 ng/mL Final   Comment: (NOTE)       Roche ECLIA methodology       Nonsmokers  <3.9                                     Smokers     <5.6 Performed At: [facility] [place], [place] [id number] Lindon Romp MD TD:[id number]   . Magnesium 05/20/2017 1.8  1.7 - 2.4 mg/dL Final    Assessment:  Tamara Hall is a 61 y.o. female with clinical stage T3N1M1 rectal cancer.   She presented with a 9 month history of rectal bleeding.  Colonoscopy on 01/14/2017 revealed a frond-like/villous non-obstructing large mass in the rectum. The mass was non-circumferential.  Biopsies revealed high grade dysplasia within an adenoma (? sampling error).  CEA was 31.6 on 01/15/2017.  Abdomen and pelvic CT on 01/13/2017 revealed irregular thickening of the rectum suspicious for carcinoma.  There was a 1.6 cm hypoattenuating lesion in the right hepatic lobe worrisome for metastatic disease.  Chest CT on 01/15/2017 revealed a 9 mm noncalcified left lower lobe pulmonary nodule (new).  Pelvic MRI on 01/16/2017 revealed rectal adenocarcinoma T3N1.  There was extension beyond the muscularis propria (17 mm).  There were mesorectal nodes >= 5 mm.  There was adenopathy on the left side of the mesorectum, including a 9 mm lesion.  The distance from the tumor to the anal sphincter was 8 mm.  PET scan on 01/27/2017 revealed intense metabolic activity associated rectal mass consists with primary rectal carcinoma.  There was a hypermetabolic metastatic lesion to the central LEFT hepatic lobe.  There was metabolic activity associated with small LEFT lower lobe nodule which was most consistent with pulmonary metastasis.  Liver MRI on 01/31/2017 revealed a 2.6 x 2.7 x 2.1 cm lesion in segment VIII of the liver near the junction with segment IVa c/w a metastatic lesion.  There was a 1.1 x 0.8 cm suspected metastatic lesion peripherally in segment VI of the liver.  CT guided liver [facility] at [facility] on 02/07/2017 revealed malignant cells c/w metastatic colorectal adenocarcinoma.  Tumor was sent for  MMR/MSI, KRAS/NRAS, and BRAF.  She is scheduled to have liver directed therapy (ablation) at [facility] on 03/19/2017.  CEA has been followed: 31.6 on 01/15/2017, 38.1 on 02/18/2017, 25.1 on 03/18/2017, 11.8 on 04/08/2017, 9.2 on 04/22/2017, 9.5 on 05/20/2017, and 9.6 on 06/19/2017.  She is s/p 2 cycle of FOLFOX  (02/18/2017 - 03/04/2017).  She developed transient cold induced neuropathy.  She developed wheezing and hypoxia with cycle #2 felt secondary to oxaliplatin.  She was treated with oxygen by face mask, Benadryl, hydrocortisone, and nebulized albuterol.    She is s/p 4 cycles of FOLFIRI (03/24/2017 - 04/22/2017) with Neulasta support.  She has had nausea.  Diarrhea is controlled with Imodium.  Abdomen and pelvic CT on 05/16/2017 revealed persistent but decreased asymmetric wall thickening in the rectum.  The medial segment left liver lesion was smaller (2.7 x 2.6 cm to 1.9 x 1.6 cm). A tiny lesion seen in segment VI of the liver on previous MRI was not evident on today's CT scan.  The left lower lobe pulmonary nodule was smaller (9 mm to 6 mm).  There was no new or progressive findings in the abdomen and pelvis.  She underwent CT guided microwave  ablation of the liver lesion on 06/06/2017.  She began radiation on 06/18/2017.  She received daily Xeloda with radiation.  She has an 80 pack year smoking history.  She is smoking.  Symptomatically, patient doing well. She had some indigestion after taking her Xeloda.  Exam is stable.   Plan: 1.  Labs today: CBC with diff, CMP, CEA. 2.  Continue Xeloda as previously prescribed. 3.  Continue daily radiation.  4.  Rx: omeprazole 68m PO daily. 5.  RTC in 1 week for MD assessment, labs (CBC with diff, CMP); make weekly appointments for MD and labs x 5.    BHonor Loh NP  06/19/2017, 2:25 PM   I saw and evaluated the patient, participating in the key portions of the service and reviewing pertinent diagnostic studies and records.  I reviewed the nurse practitioner's note and agree with the findings and the plan.  The assessment and plan were discussed with the patient.  Multiple questions were asked by the patient and answered.   MLequita Asal MD 06/19/2017

## 2017-06-20 ENCOUNTER — Ambulatory Visit
Admission: RE | Admit: 2017-06-20 | Discharge: 2017-06-20 | Disposition: A | Discharge status: Home / Self Care | Payer: Managed Care, Other (non HMO) | Source: Ambulatory Visit | Attending: Radiation Oncology | Admitting: Radiation Oncology

## 2017-06-20 DIAGNOSIS — C20 Malignant neoplasm of rectum: Secondary | ICD-10-CM | POA: Diagnosis not present

## 2017-06-20 LAB — CEA: CEA: 9.6 ng/mL — ABNORMAL HIGH (ref 0.0–4.7)

## 2017-06-23 ENCOUNTER — Ambulatory Visit: Admission: RE | Admit: 2017-06-23 | Payer: Managed Care, Other (non HMO) | Source: Ambulatory Visit

## 2017-06-24 ENCOUNTER — Ambulatory Visit
Admission: RE | Admit: 2017-06-24 | Discharge: 2017-06-24 | Disposition: A | Discharge status: Home / Self Care | Payer: Managed Care, Other (non HMO) | Source: Ambulatory Visit | Attending: Radiation Oncology | Admitting: Radiation Oncology

## 2017-06-24 DIAGNOSIS — C20 Malignant neoplasm of rectum: Secondary | ICD-10-CM | POA: Diagnosis not present

## 2017-06-25 ENCOUNTER — Ambulatory Visit
Admission: RE | Admit: 2017-06-25 | Discharge: 2017-06-25 | Disposition: A | Discharge status: Home / Self Care | Payer: Managed Care, Other (non HMO) | Source: Ambulatory Visit | Attending: Radiation Oncology | Admitting: Radiation Oncology

## 2017-06-25 DIAGNOSIS — C20 Malignant neoplasm of rectum: Secondary | ICD-10-CM | POA: Diagnosis not present

## 2017-06-26 ENCOUNTER — Ambulatory Visit: Payer: Managed Care, Other (non HMO)

## 2017-06-26 ENCOUNTER — Inpatient Hospital Stay: Payer: Managed Care, Other (non HMO)

## 2017-06-26 ENCOUNTER — Inpatient Hospital Stay: Payer: Managed Care, Other (non HMO) | Admitting: Hematology and Oncology

## 2017-06-27 ENCOUNTER — Ambulatory Visit: Payer: Managed Care, Other (non HMO) | Admitting: Hematology and Oncology

## 2017-06-27 ENCOUNTER — Ambulatory Visit
Admission: RE | Admit: 2017-06-27 | Discharge: 2017-06-27 | Disposition: A | Discharge status: Home / Self Care | Payer: Managed Care, Other (non HMO) | Source: Ambulatory Visit | Attending: Radiation Oncology | Admitting: Radiation Oncology

## 2017-06-27 ENCOUNTER — Other Ambulatory Visit: Payer: Managed Care, Other (non HMO)

## 2017-06-27 DIAGNOSIS — C20 Malignant neoplasm of rectum: Secondary | ICD-10-CM | POA: Diagnosis not present

## 2017-06-30 ENCOUNTER — Inpatient Hospital Stay (HOSPITAL_BASED_OUTPATIENT_CLINIC_OR_DEPARTMENT_OTHER): Payer: Managed Care, Other (non HMO) | Admitting: Hematology and Oncology

## 2017-06-30 ENCOUNTER — Encounter: Payer: Self-pay | Admitting: Emergency Medicine

## 2017-06-30 ENCOUNTER — Inpatient Hospital Stay: Payer: Managed Care, Other (non HMO)

## 2017-06-30 ENCOUNTER — Encounter: Payer: Self-pay | Admitting: Hematology and Oncology

## 2017-06-30 ENCOUNTER — Ambulatory Visit
Admission: RE | Admit: 2017-06-30 | Discharge: 2017-06-30 | Disposition: A | Discharge status: Home / Self Care | Payer: Managed Care, Other (non HMO) | Source: Ambulatory Visit | Attending: Radiation Oncology | Admitting: Radiation Oncology

## 2017-06-30 ENCOUNTER — Other Ambulatory Visit: Payer: Managed Care, Other (non HMO)

## 2017-06-30 ENCOUNTER — Emergency Department
Admission: EM | Admit: 2017-06-30 | Discharge: 2017-06-30 | Disposition: A | Discharge status: Home / Self Care | Payer: Managed Care, Other (non HMO) | Attending: Emergency Medicine | Admitting: Emergency Medicine

## 2017-06-30 ENCOUNTER — Other Ambulatory Visit: Payer: Self-pay | Admitting: *Deleted

## 2017-06-30 VITALS — BP 145/69 | HR 90 | Temp 98.3°F | Resp 28 | Wt 186.4 lb

## 2017-06-30 DIAGNOSIS — I10 Essential (primary) hypertension: Secondary | ICD-10-CM | POA: Insufficient documentation

## 2017-06-30 DIAGNOSIS — Z79899 Other long term (current) drug therapy: Secondary | ICD-10-CM

## 2017-06-30 DIAGNOSIS — Z800 Family history of malignant neoplasm of digestive organs: Secondary | ICD-10-CM

## 2017-06-30 DIAGNOSIS — C7802 Secondary malignant neoplasm of left lung: Secondary | ICD-10-CM

## 2017-06-30 DIAGNOSIS — Z801 Family history of malignant neoplasm of trachea, bronchus and lung: Secondary | ICD-10-CM

## 2017-06-30 DIAGNOSIS — R0789 Other chest pain: Secondary | ICD-10-CM

## 2017-06-30 DIAGNOSIS — Z8701 Personal history of pneumonia (recurrent): Secondary | ICD-10-CM

## 2017-06-30 DIAGNOSIS — F17210 Nicotine dependence, cigarettes, uncomplicated: Secondary | ICD-10-CM | POA: Insufficient documentation

## 2017-06-30 DIAGNOSIS — R197 Diarrhea, unspecified: Secondary | ICD-10-CM | POA: Diagnosis not present

## 2017-06-30 DIAGNOSIS — G629 Polyneuropathy, unspecified: Secondary | ICD-10-CM

## 2017-06-30 DIAGNOSIS — R0902 Hypoxemia: Secondary | ICD-10-CM

## 2017-06-30 DIAGNOSIS — C787 Secondary malignant neoplasm of liver and intrahepatic bile duct: Secondary | ICD-10-CM

## 2017-06-30 DIAGNOSIS — Z9221 Personal history of antineoplastic chemotherapy: Secondary | ICD-10-CM

## 2017-06-30 DIAGNOSIS — R062 Wheezing: Secondary | ICD-10-CM

## 2017-06-30 DIAGNOSIS — J208 Acute bronchitis due to other specified organisms: Secondary | ICD-10-CM | POA: Diagnosis not present

## 2017-06-30 DIAGNOSIS — C20 Malignant neoplasm of rectum: Secondary | ICD-10-CM | POA: Diagnosis not present

## 2017-06-30 DIAGNOSIS — K30 Functional dyspepsia: Secondary | ICD-10-CM

## 2017-06-30 DIAGNOSIS — Z7189 Other specified counseling: Secondary | ICD-10-CM

## 2017-06-30 DIAGNOSIS — R1031 Right lower quadrant pain: Secondary | ICD-10-CM

## 2017-06-30 DIAGNOSIS — Z923 Personal history of irradiation: Secondary | ICD-10-CM

## 2017-06-30 LAB — CBC WITH DIFFERENTIAL/PLATELET
Basophils Absolute: 0 10*3/uL (ref 0–0.1)
Basophils Relative: 0 %
Eosinophils Absolute: 0.1 10*3/uL (ref 0–0.7)
Eosinophils Relative: 2 %
HCT: 36.9 % (ref 35.0–47.0)
Hemoglobin: 12.9 g/dL (ref 12.0–16.0)
Lymphocytes Relative: 21 %
Lymphs Abs: 1.1 10*3/uL (ref 1.0–3.6)
MCH: 32.9 pg (ref 26.0–34.0)
MCHC: 34.8 g/dL (ref 32.0–36.0)
MCV: 94.6 fL (ref 80.0–100.0)
Monocytes Absolute: 0.3 10*3/uL (ref 0.2–0.9)
Monocytes Relative: 6 %
Neutro Abs: 3.6 10*3/uL (ref 1.4–6.5)
Neutrophils Relative %: 71 %
Platelets: 176 10*3/uL (ref 150–440)
RBC: 3.91 MIL/uL (ref 3.80–5.20)
RDW: 15.1 % — ABNORMAL HIGH (ref 11.5–14.5)
WBC: 5.1 10*3/uL (ref 3.6–11.0)

## 2017-06-30 LAB — COMPREHENSIVE METABOLIC PANEL
ALT: 12 U/L — ABNORMAL LOW (ref 14–54)
AST: 19 U/L (ref 15–41)
Albumin: 3.8 g/dL (ref 3.5–5.0)
Alkaline Phosphatase: 89 U/L (ref 38–126)
Anion gap: 8 (ref 5–15)
BUN: 11 mg/dL (ref 6–20)
CO2: 25 mmol/L (ref 22–32)
Calcium: 9 mg/dL (ref 8.9–10.3)
Chloride: 105 mmol/L (ref 101–111)
Creatinine, Ser: 0.7 mg/dL (ref 0.44–1.00)
GFR calc Af Amer: >60 mL/min (ref >60)
GFR calc non Af Amer: >60 mL/min (ref >60)
Glucose, Bld: 134 mg/dL — ABNORMAL HIGH (ref 65–99)
Potassium: 3.7 mmol/L (ref 3.5–5.1)
Sodium: 138 mmol/L (ref 135–145)
Total Bilirubin: 0.4 mg/dL (ref 0.3–1.2)
Total Protein: 6.8 g/dL (ref 6.5–8.1)

## 2017-06-30 LAB — MAGNESIUM: Magnesium: 1.9 mg/dL (ref 1.7–2.4)

## 2017-06-30 MED ORDER — PREDNISONE 10 MG (21) PO TBPK: ORAL_TABLET | ORAL | 0 refills | Status: DC

## 2017-06-30 MED ORDER — ALBUTEROL SULFATE HFA 108 (90 BASE) MCG/ACT IN AERS: 2.0000 | INHALATION_SPRAY | Freq: Four times a day (QID) | RESPIRATORY_TRACT | 2 refills | Status: DC | PRN

## 2017-06-30 MED ORDER — AZITHROMYCIN 250 MG PO TABS: ORAL_TABLET | ORAL | 0 refills | Status: AC

## 2017-06-30 MED ORDER — ALBUTEROL SULFATE (2.5 MG/3ML) 0.083% IN NEBU
2.5000 mg | INHALATION_SOLUTION | Freq: Once | RESPIRATORY_TRACT | Status: AC
  Administered 2017-06-30: 2.5 mg via RESPIRATORY_TRACT
  Filled 2017-06-30: qty 3

## 2017-06-30 NOTE — Discharge Instructions (Signed)
Take medication as prescribed. Return to emergency department if symptoms worsen and follow-up with PCP as needed.   °

## 2017-06-30 NOTE — ED Notes (Signed)
See triage note   Presents with cough and congestion   States sx's started last week  No fever  No resp distress noted on arrival

## 2017-06-30 NOTE — Progress Notes (Signed)
Here for follow up. Stated x 3 days she has noticed bright Frank blood w BMs -approx 3 BM per day. Stated no blood noted unless she has BM no pain w BM. Stated she has rectal area pain today -level 2 more intense w sitting.

## 2017-06-30 NOTE — ED Triage Notes (Signed)
Patient presents to ED via POV from home with c/o congestion and cough since Wednesday. Patient talking on cell phone during triage.

## 2017-06-30 NOTE — Progress Notes (Signed)
[facility] day:  06/30/2017   Chief Complaint: Tamara Hall is a 61 y.o. female with stage IV rectal cancer who is seen for weekly assessment on concurrent Xeloda and radiation.  HPI:  The patient was last seen in the medical oncology clinic on 06/19/2017.  At that time, she had mild residual discomfort s/p microwave ablation of the liver lesion.  She had started radiation and Xeloda on 06/18/2017.  She was tolerating treatment well.  During the interim, patient has been "ok". Patient has had a non-productive cough for the the last 4-5 days. Patient with sinus congestion. She denies fever. Patient continues to smoke; currently smoking 1/2 pack of cigarettes per day. Patient with some shortness of breath. She continues on Xeloda with no perceived side effects. She is have 2-3 loose stools a day; improved from 5-6 earlier on in her therapy. Patient is having some rectal bleeding; describes it as "spotting". Patient eating well; no weight loss.  Previously prescribed Prilosec has been effective in managing her acid reflux symptoms.   Patient started radiation on 06/18/2017. She missed treatment on 06/23/2017 and 06/26/2017.   Past Medical History:  Diagnosis Date  . Cancer ([place])   . Hypertension   . Pneumonia    20+ years ago    Past Surgical History:  Procedure Laterality Date  . ABDOMINAL HYSTERECTOMY    . BREAST SURGERY    . COLONOSCOPY WITH PROPOFOL N/A 01/14/2017   Procedure: COLONOSCOPY WITH PROPOFOL;  Surgeon: Lucilla Lame, MD;  Location: [facility];  Service: Endoscopy;  Laterality: N/A;  . ELBOW SURGERY Right    Has had 4 surgeries on right elbow.  Marland Kitchen HAND SURGERY Right 2008  . JOINT REPLACEMENT    . PORTA CATH INSERTION N/A 01/29/2017   Procedure: Glori Luis Cath Insertion;  Surgeon: Algernon Huxley, MD;  Location: [facility];  Service: Cardiovascular;  Laterality: N/A;  . TONSILLECTOMY      Family History  Problem Relation  Age of Onset  . Lung cancer Mother   . Lung cancer Father   . Brain cancer Maternal Grandmother     Social History:  reports that she has been smoking Cigarettes.  She has a 80.00 pack-year smoking history. She has never used smokeless tobacco. She reports that she does not drink alcohol or use drugs.  She lives in [place].  She has 3 children (ages 54, 108, and 55).  She smokes 2 packs a day.  She started smoking at age 9.  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  [name] is her significant other.  She is alone today.  Allergies: No Known Allergies  Current Medications: Current Outpatient Prescriptions  Medication Sig Dispense Refill  . capecitabine (XELODA) 150 MG tablet Take one 150 mg pill twice a day for 5 days each week during radiation with three 500 mg tablets 50 tablet 0  . capecitabine (XELODA) 500 MG tablet Take three  500 mg pills twice a day for 5 days each week during radiation with one 150 mg tablet 150 tablet 0  . Hydrocodone-Acetaminophen 7.5-300 MG TABS Take 1 tablet by mouth every 6 (six) hours as needed (pain). 60 each 0  . omeprazole (PRILOSEC) 20 MG capsule Take 1 capsule (20 mg total) by mouth daily. 30 capsule 0  . ondansetron (ZOFRAN) 8 MG tablet Take 1 tablet (8 mg total) by mouth every  8 (eight) hours as needed. 30 tablet 2  . acetaminophen (TYLENOL) 325 MG tablet Take 2 tablets (650 mg total) by mouth every 6 (six) hours as needed for mild pain (or Fever >/= 101). (Patient not taking: Reported on 06/30/2017)    . busPIRone (BUSPAR) 7.5 MG tablet Take 7.5 mg by mouth.    . lidocaine-prilocaine (EMLA) cream APPLY A SMALL AMOUNT TO PORT SITE AT LEAST 1 HOUR BEFORE BEING ACCESSED. COVER IN PLASTIC WRAP. (Patient not taking: Reported on 06/30/2017) 30 g 1  . LORazepam (ATIVAN) 0.5 MG tablet Take 1 tablet (0.5 mg total) by mouth every 6 (six) hours as needed. For nausea or vomiting (Patient not taking: Reported on  06/30/2017) 30 tablet 1  . Magnesium Hydroxide (MILK OF MAGNESIA PO) Take 30 mLs by mouth at bedtime.      No current facility-administered medications for this visit.     Review of Systems:  GENERAL:  Feels "good".  No fevers or sweats.  Weight stable. PERFORMANCE STATUS (ECOG):  1 HEENT:  No visual changes, runny nose, sore throat, mouth sores or tenderness. Lungs: Shortness of breath. Cough.  No hemoptysis. Cardiac:  No chest pain, palpitations, orthopnea, or PND.  Elevated BP due to anxiety. GI:  Eating well.  Nausea controlled with Ativan and ondansetron.  No vomiting, constipation or melena.  Some rectal bleeding; spotting. GU:  No urgency, frequency, dysuria, or hematuria. Musculoskeletal:  No back pain.  No joint pain.  No muscle tenderness. Extremities:  No pain or swelling. Skin:  No rashes or skin changes. Neuro:  No headache, numbness or weakness, balance or coordination issues. Endocrine:  No diabetes, thyroid issues, hot flashes or night sweats. Psych:  Stress/anxiety.  No depression. Pain: Rectal pain (2 out of 10). Review of systems:  All other systems reviewed and found to be negative.  Physical Exam: Blood pressure (!) 145/69, pulse 90, temperature 98.3 F (36.8 C), temperature source Oral, resp. rate 18, weight 186 lb 6.4 oz (84.6 kg). GENERAL:  Well developed, well nourished, woman sitting comfortably in the oncology clinic in no acute distress.   MENTAL STATUS:  Alert and oriented to person, place and time. HEAD:Long grayhair in a pony tail. Normocephalic, atraumatic, face symmetric, no Cushingoid features. EYES:Blueeyes. Pupils equal round and reactive to light and accomodation. No conjunctivitis or scleral icterus. ULA:GTXMIWOEHO clear without lesion. Tonguenormal. Mucous membranes moist. RESPIRATORY:Scattered wheezing. No rales or rhonchi. CARDIOVASCULAR:Regular rate andrhythmwithout murmur, rub or gallop. ABDOMEN:Tenderness over ablation  sites.Soft;  active bowel soundsand no hepatosplenomegaly. No masses. SKIN: No rashes, ulcers or lesions. EXTREMITIES: No edema, no skin discoloration or tenderness. No palpable cords. LYMPHNODES: No palpable cervical, supraclavicular, axillary or inguinal adenopathy  NEUROLOGICAL: Unremarkable. PSYCH: Appropriate.   Appointment on 06/30/2017  Component Date Value Ref Range Status  . WBC 06/30/2017 5.1  3.6 - 11.0 K/uL Final  . RBC 06/30/2017 3.91  3.80 - 5.20 MIL/uL Final  . Hemoglobin 06/30/2017 12.9  12.0 - 16.0 g/dL Final  . HCT 06/30/2017 36.9  35.0 - 47.0 % Final  . MCV 06/30/2017 94.6  80.0 - 100.0 fL Final  . MCH 06/30/2017 32.9  26.0 - 34.0 pg Final  . MCHC 06/30/2017 34.8  32.0 - 36.0 g/dL Final  . RDW 06/30/2017 15.1* 11.5 - 14.5 % Final  . Platelets 06/30/2017 176  150 - 440 K/uL Final  . Neutrophils Relative % 06/30/2017 71  % Final  . Neutro Abs 06/30/2017 3.6  1.4 - 6.5 K/uL Final  .  Lymphocytes Relative 06/30/2017 21  % Final  . Lymphs Abs 06/30/2017 1.1  1.0 - 3.6 K/uL Final  . Monocytes Relative 06/30/2017 6  % Final  . Monocytes Absolute 06/30/2017 0.3  0.2 - 0.9 K/uL Final  . Eosinophils Relative 06/30/2017 2  % Final  . Eosinophils Absolute 06/30/2017 0.1  0 - 0.7 K/uL Final  . Basophils Relative 06/30/2017 0  % Final  . Basophils Absolute 06/30/2017 0.0  0 - 0.1 K/uL Final  . Sodium 06/30/2017 138  135 - 145 mmol/L Final  . Potassium 06/30/2017 3.7  3.5 - 5.1 mmol/L Final  . Chloride 06/30/2017 105  101 - 111 mmol/L Final  . CO2 06/30/2017 25  22 - 32 mmol/L Final  . Glucose, Bld 06/30/2017 134* 65 - 99 mg/dL Final  . BUN 06/30/2017 11  6 - 20 mg/dL Final  . Creatinine, Ser 06/30/2017 0.70  0.44 - 1.00 mg/dL Final  . Calcium 06/30/2017 9.0  8.9 - 10.3 mg/dL Final  . Total Protein 06/30/2017 6.8  6.5 - 8.1 g/dL Final  . Albumin 06/30/2017 3.8  3.5 - 5.0 g/dL Final  . AST 06/30/2017 19  15 - 41 U/L Final  . ALT 06/30/2017 12* 14 - 54 U/L Final  .  Alkaline Phosphatase 06/30/2017 89  38 - 126 U/L Final  . Total Bilirubin 06/30/2017 0.4  0.3 - 1.2 mg/dL Final  . GFR calc non Af Amer 06/30/2017 >60  >60 mL/min Final  . GFR calc Af Amer 06/30/2017 >60  >60 mL/min Final   Comment: (NOTE) The eGFR has been calculated using the CKD EPI equation. This calculation has not been validated in all clinical situations. eGFR's persistently <60 mL/min signify possible Chronic Kidney Disease.   . Anion gap 06/30/2017 8  5 - 15 Final  . Magnesium 06/30/2017 1.9  1.7 - 2.4 mg/dL Final    Assessment:  Tamara Hall is a 61 y.o. female with clinical stage T3N1M1 rectal cancer.   She presented with a 9 month history of rectal bleeding.  Colonoscopy on 01/14/2017 revealed a frond-like/villous non-obstructing large mass in the rectum. The mass was non-circumferential.  Biopsies revealed high grade dysplasia within an adenoma (? sampling error).  CEA was 31.6 on 01/15/2017.  Abdomen and pelvic CT on 01/13/2017 revealed irregular thickening of the rectum suspicious for carcinoma.  There was a 1.6 cm hypoattenuating lesion in the right hepatic lobe worrisome for metastatic disease.  Chest CT on 01/15/2017 revealed a 9 mm noncalcified left lower lobe pulmonary nodule (new).  Pelvic MRI on 01/16/2017 revealed rectal adenocarcinoma T3N1.  There was extension beyond the muscularis propria (17 mm).  There were mesorectal nodes >= 5 mm.  There was adenopathy on the left side of the mesorectum, including a 9 mm lesion.  The distance from the tumor to the anal sphincter was 8 mm.  PET scan on 01/27/2017 revealed intense metabolic activity associated rectal mass consists with primary rectal carcinoma.  There was a hypermetabolic metastatic lesion to the central LEFT hepatic lobe.  There was metabolic activity associated with small LEFT lower lobe nodule which was most consistent with pulmonary metastasis.  Liver MRI on 01/31/2017 revealed a 2.6 x 2.7 x 2.1 cm lesion in  segment VIII of the liver near the junction with segment IVa c/w a metastatic lesion.  There was a 1.1 x 0.8 cm suspected metastatic lesion peripherally in segment VI of the liver.  CT guided liver biopsy at [facility] on 02/07/2017 revealed  malignant cells c/w metastatic colorectal adenocarcinoma.  Tumor was sent for  MMR/MSI, KRAS/NRAS, and BRAF.  She is scheduled to have liver directed therapy (ablation) at [facility] on 03/19/2017.  CEA has been followed: 31.6 on 01/15/2017, 38.1 on 02/18/2017, 25.1 on 03/18/2017, 11.8 on 04/08/2017, 9.2 on 04/22/2017, 9.5 on 05/20/2017, and 9.6 on 06/19/2017.  She is s/p 2 cycle of FOLFOX  (02/18/2017 - 03/04/2017).  She developed transient cold induced neuropathy.  She developed wheezing and hypoxia with cycle #2 felt secondary to oxaliplatin.  She was treated with oxygen by face mask, Benadryl, hydrocortisone, and nebulized albuterol.    She is s/p 4 cycles of FOLFIRI (03/24/2017 - 04/22/2017) with Neulasta support.  She has had nausea.  Diarrhea is controlled with Imodium.  Abdomen and pelvic CT on 05/16/2017 revealed persistent but decreased asymmetric wall thickening in the rectum.  The medial segment left liver lesion was smaller (2.7 x 2.6 cm to 1.9 x 1.6 cm). A tiny lesion seen in segment VI of the liver on previous MRI was not evident on today's CT scan.  The left lower lobe pulmonary nodule was smaller (9 mm to 6 mm).  There was no new or progressive findings in the abdomen and pelvis.  She underwent CT guided microwave ablation of the liver lesion on 06/06/2017.  She began radiation on 06/18/2017.  She receives daily Xeloda with radiation. She missed treatment on 06/23/2017 and 06/26/2017.  She has an 80 pack year smoking history.  She is smoking.  Symptomatically, her reflux is better with the Prilosec. Patient having some minor rectal bleeding described as spotting.  She has audible wheezing; SPO2 98% on room air.  Plan: 1.  Labs today: CBC with diff, CMP,  Mg. 2.  Continue Xeloda as prescribed.  3.  Continue radiation treatments as previously scheduled.  4.  Patient to ER for further evaluation and treatment. Patient needs STAT CXR followed by SVN treatments, IV steroids, and possible antibiotics. Dr. Mike Gip called and spoke with ED provider about transfer plan of care to their department.  5.  RTC in 1 week for MD assessment, labs (CBC with diff, CMP); make weekly appointments for MD and labs x 5.    Honor Loh, NP  06/30/2017, 11:10 AM   I saw and evaluated the patient, participating in the key portions of the service and reviewing pertinent diagnostic studies and records.  I reviewed the nurse practitioner's note and agree with the findings and the plan.  The assessment and plan were discussed with the patient.  A few *questions were asked by the patient and answered.   Lequita Asal, MD 06/30/2017, 11:10 AM

## 2017-06-30 NOTE — ED Provider Notes (Signed)
[facility] Emergency Department Provider Note   ____________________________________________   I have reviewed the triage vital signs and the nursing notes.   HISTORY  Chief Complaint Cough    HPI Tamara Hall is a 61 y.o. female source department with cough cold congestion and wheezing that began 2-3 days ago. Patient is a cancer patient with the [facility]. Patient currently receiving radiation and chemo for colon and liver cancer. She had an appointment this morning and her provider noted the above symptoms. Her provider was concerned about the above symptoms and requested sh come to the emergency department to be evaluated. Patient denies fevers, chills, nausea, vomiting or body aches. Patient denies fever, chills, headache, vision changes, chest pain, chest tightness, abdominal pain, nausea and vomiting.  Past Medical History:  Diagnosis Date  . Cancer ([name])   . Hypertension   . Pneumonia    20+ years ago    Patient Active Problem List   Diagnosis Date Noted  . 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 Nausea without vomiting 05/06/2017  . Liver metastases ([place]) 02/25/2017  . Goals of care, counseling/discussion 02/18/2017  . Encounter for antineoplastic chemotherapy 02/18/2017  . Cancer related pain 02/16/2017  . Tobacco use 02/06/2017  . Abnormal findings-gastrointestinal tract   . Rectum neoplasm   . Rectal bleeding 01/13/2017  . Rectal cancer [facility])     Past Surgical History:  Procedure Laterality Date  . ABDOMINAL HYSTERECTOMY    . BREAST SURGERY    . COLONOSCOPY WITH PROPOFOL N/A 01/14/2017   Procedure: COLONOSCOPY WITH PROPOFOL;  Surgeon: Lucilla Lame, MD;  Location: [facility] ENDOSCOPY;  Service: Endoscopy;  Laterality: N/A;  . ELBOW SURGERY Right    Has had 4 surgeries on right elbow.  Marland Kitchen HAND SURGERY Right 2008  . JOINT REPLACEMENT    . PORTA CATH INSERTION N/A 01/29/2017   Procedure: Glori Luis Cath  Insertion;  Surgeon: Algernon Huxley, MD;  Location: [place] [facility];  Service: Cardiovascular;  Laterality: N/A;  . TONSILLECTOMY      Prior to Admission medications   Medication Sig Start Date End Date Taking? Authorizing Provider  acetaminophen (TYLENOL) 325 MG tablet Take 2 tablets (650 mg total) by mouth every 6 (six) hours as needed for mild pain (or Fever >/= 101). Patient not taking: Reported on 06/30/2017 01/15/17   Dustin Flock, MD  albuterol (PROVENTIL HFA;VENTOLIN HFA) 108 (90 Base) MCG/ACT inhaler Inhale 2 puffs into the lungs every 6 (six) hours as needed for wheezing or shortness of breath. 06/30/17   Manie Bealer M, PA-C  azithromycin (ZITHROMAX Z-PAK) 250 MG tablet Take 2 tablets (500 mg) on  Day 1,  followed by 1 tablet (250 mg) once daily on Days 2 through 5. 06/30/17 07/05/17  Kashauna Celmer M, PA-C  busPIRone (BUSPAR) 7.5 MG tablet Take 7.5 mg by mouth. 02/06/17 02/06/18  [provider]  capecitabine (XELODA) 150 MG tablet Take one 150 mg pill twice a day for 5 days each week during radiation with three 500 mg tablets 05/20/17   Lequita Asal, MD  capecitabine (XELODA) 500 MG tablet Take three  500 mg pills twice a day for 5 days each week during radiation with one 150 mg tablet 05/20/17   Lequita Asal, MD  Hydrocodone-Acetaminophen 7.5-300 MG TABS Take 1 tablet by mouth every 6 (six) hours as needed (pain). 06/16/17   Lequita Asal, MD  lidocaine-prilocaine (EMLA) cream APPLY A SMALL AMOUNT  TO PORT SITE AT LEAST 1 HOUR BEFORE BEING ACCESSED. COVER IN PLASTIC WRAP. Patient not taking: Reported on 06/30/2017 06/17/17   Lequita Asal, MD  LORazepam (ATIVAN) 0.5 MG tablet Take 1 tablet (0.5 mg total) by mouth every 6 (six) hours as needed. For nausea or vomiting Patient not taking: Reported on 06/30/2017 05/01/17   Lequita Asal, MD  Magnesium Hydroxide (MILK OF MAGNESIA PO) Take 30 mLs by mouth at bedtime.     [provider]  omeprazole  (PRILOSEC) 20 MG capsule Take 1 capsule (20 mg total) by mouth daily. 06/19/17   Karen Kitchens, NP  ondansetron (ZOFRAN) 8 MG tablet Take 1 tablet (8 mg total) by mouth every 8 (eight) hours as needed. 04/22/17   Lequita Asal, MD  predniSONE (STERAPRED UNI-PAK 21 TAB) 10 MG (21) TBPK tablet Take 6 tablets on day 1. Take 5 tablets on day 2. Take 4 tablets on day 3. Take 3 tablets on day 4. Take 2 tablets on day 5. Take 1 tablets on day 6. 06/30/17   Jourdon Zimmerle M, PA-C    Allergies Patient has no known allergies.  Family History  Problem Relation Age of Onset  . Lung cancer Mother   . Lung cancer Father   . Brain cancer Maternal Grandmother     Social History Social History  Substance Use Topics  . Smoking status: Current Every Day Smoker    Packs/day: 2.00    Years: 40.00    Types: Cigarettes  . Smokeless tobacco: Never Used  . Alcohol use No    Review of Systems Constitutional: Negative for fever/chills Eyes: No visual changes. ENT:  Negative for sore throat and for difficulty swallowing Cardiovascular: Denies chest pain. Respiratory: Positive for cough. Shortness of breath. Bilateral wheezing.  Gastrointestinal: No abdominal pain.  No nausea, vomiting, diarrhea. Musculoskeletal: Negative for back pain. Skin: Negative for rash. Neurological: Negative for headaches. Able to ambulate. ____________________________________________   PHYSICAL EXAM:  VITAL SIGNS: ED Triage Vitals  Enc Vitals Group     BP 06/30/17 1144 (!) 142/67     Pulse Rate 06/30/17 1144 90     Resp 06/30/17 1144 17     Temp 06/30/17 1144 98.3 F (36.8 C)     Temp Source 06/30/17 1144 Oral     SpO2 06/30/17 1144 98 %     Weight 06/30/17 1145 186 lb (84.4 kg)     Height 06/30/17 1145 5\' 7"  (1.702 m)     Head Circumference --      Peak Flow --      Pain Score --      Pain Loc --      Pain Edu? --      Excl. in [place]? --     Constitutional: Alert and oriented. Well appearing and in no acute  distress.  Eyes: Conjunctivae are normal. PERRL. EOMI  Head: Normocephalic and atraumatic. ENT:      Ears: Canals clear. TMs intact bilaterally.      Nose: No congestion/rhinnorhea.      Mouth/Throat: Mucous membranes are moist. Oropharynx Nonerythematous or edematous. Tonsils  Neck:Supple. No thyromegaly. No stridor.  Cardiovascular: Normal rate, regular rhythm. Normal S1 and S2.  Good peripheral circulation. Respiratory: Normal respiratory effort without tachypnea or retractions. Lungs CTAB. Bilateral lung field mild wheezing all fields on expiration. Good air entry to the bases with no decreased or absent breath sounds. Hematological/Lymphatic/Immunological: No cervical lymphadenopathy. Cardiovascular: Normal rate, regular rhythm. Normal distal  pulses. Gastrointestinal: Bowel sounds 4 quadrants. Soft and nontender to palpation. Musculoskeletal: Nontender with normal range of motion in all extremities. Neurologic: Normal speech and language.  Skin:  Skin is warm, dry and intact. No rash noted. Psychiatric: Mood and affect are normal. Speech and behavior are normal. Patient exhibits appropriate insight and judgement.  ____________________________________________   LABS (all labs ordered are listed, but only abnormal results are displayed)  Labs Reviewed - No data to display ____________________________________________  EKG none ____________________________________________  RADIOLOGY none ____________________________________________   PROCEDURES  Procedure(s) performed: no    Critical Care performed: no ____________________________________________   INITIAL IMPRESSION / ASSESSMENT AND PLAN / ED COURSE  Pertinent labs & imaging results that were available during my care of the patient were reviewed by me and considered in my medical decision making (see chart for details).  Patient presents to emergency department with cough, wheezing and exertional  SOB History and  physical exam findings are reassuring symptoms are consistent with acute bronchitis and intermittent wheezing. Patient responded well to nebulized albuterol given during the course of care in the emergency department. Patient will be prescribed presently taper, azithromycin and albuterol inhaler for symptom management. Patient advised to follow up with PCP as needed, oncologist as needed or return to the emergency department if symptoms return or worsen. Patient informed of clinical course, understand medical decision-making process, and agree with plan.  ____________________________________________   FINAL CLINICAL IMPRESSION(S) / ED DIAGNOSES  Final diagnoses:  Acute bronchitis due to other specified organisms  Wheezing       NEW MEDICATIONS STARTED DURING THIS VISIT:  Discharge Medication List as of 06/30/2017  1:53 PM    START taking these medications   Details  albuterol (PROVENTIL HFA;VENTOLIN HFA) 108 (90 Base) MCG/ACT inhaler Inhale 2 puffs into the lungs every 6 (six) hours as needed for wheezing or shortness of breath., Starting Mon 06/30/2017, Print    azithromycin (ZITHROMAX Z-PAK) 250 MG tablet Take 2 tablets (500 mg) on  Day 1,  followed by 1 tablet (250 mg) once daily on Days 2 through 5., Print    predniSONE (STERAPRED UNI-PAK 21 TAB) 10 MG (21) TBPK tablet Take 6 tablets on day 1. Take 5 tablets on day 2. Take 4 tablets on day 3. Take 3 tablets on day 4. Take 2 tablets on day 5. Take 1 tablets on day 6., Print         Note:  This document was prepared using Dragon voice recognition software and may include unintentional dictation errors.    Jerolyn Shin, PA-C 06/30/17 1646    Harvest Dark, MD 07/01/17 2017

## 2017-07-01 ENCOUNTER — Ambulatory Visit
Admission: RE | Admit: 2017-07-01 | Discharge: 2017-07-01 | Disposition: A | Discharge status: Home / Self Care | Payer: Managed Care, Other (non HMO) | Source: Ambulatory Visit | Attending: Radiation Oncology | Admitting: Radiation Oncology

## 2017-07-01 ENCOUNTER — Inpatient Hospital Stay: Payer: Managed Care, Other (non HMO)

## 2017-07-01 DIAGNOSIS — C20 Malignant neoplasm of rectum: Secondary | ICD-10-CM | POA: Diagnosis not present

## 2017-07-02 ENCOUNTER — Ambulatory Visit
Admission: RE | Admit: 2017-07-02 | Discharge: 2017-07-02 | Disposition: A | Discharge status: Home / Self Care | Payer: Managed Care, Other (non HMO) | Source: Ambulatory Visit | Attending: Radiation Oncology | Admitting: Radiation Oncology

## 2017-07-02 DIAGNOSIS — C20 Malignant neoplasm of rectum: Secondary | ICD-10-CM | POA: Diagnosis not present

## 2017-07-03 ENCOUNTER — Ambulatory Visit
Admission: RE | Admit: 2017-07-03 | Discharge: 2017-07-03 | Disposition: A | Discharge status: Home / Self Care | Payer: Managed Care, Other (non HMO) | Source: Ambulatory Visit | Attending: Radiation Oncology | Admitting: Radiation Oncology

## 2017-07-03 DIAGNOSIS — C20 Malignant neoplasm of rectum: Secondary | ICD-10-CM | POA: Diagnosis not present

## 2017-07-04 ENCOUNTER — Inpatient Hospital Stay: Payer: Managed Care, Other (non HMO)

## 2017-07-04 ENCOUNTER — Inpatient Hospital Stay: Payer: Managed Care, Other (non HMO) | Admitting: Hematology and Oncology

## 2017-07-04 ENCOUNTER — Ambulatory Visit
Admission: RE | Admit: 2017-07-04 | Discharge: 2017-07-04 | Disposition: A | Discharge status: Home / Self Care | Payer: Managed Care, Other (non HMO) | Source: Ambulatory Visit | Attending: Radiation Oncology | Admitting: Radiation Oncology

## 2017-07-04 ENCOUNTER — Other Ambulatory Visit: Payer: Self-pay | Admitting: Urgent Care

## 2017-07-04 DIAGNOSIS — C20 Malignant neoplasm of rectum: Secondary | ICD-10-CM | POA: Diagnosis not present

## 2017-07-04 DIAGNOSIS — C787 Secondary malignant neoplasm of liver and intrahepatic bile duct: Secondary | ICD-10-CM

## 2017-07-04 DIAGNOSIS — Z5111 Encounter for antineoplastic chemotherapy: Secondary | ICD-10-CM

## 2017-07-04 MED ORDER — HYDROCODONE-ACETAMINOPHEN 7.5-300 MG PO TABS: 1.0000 | ORAL_TABLET | Freq: Four times a day (QID) | ORAL | 0 refills | Status: DC | PRN

## 2017-07-07 ENCOUNTER — Ambulatory Visit: Payer: Managed Care, Other (non HMO)

## 2017-07-07 ENCOUNTER — Other Ambulatory Visit: Payer: Managed Care, Other (non HMO)

## 2017-07-08 ENCOUNTER — Other Ambulatory Visit: Payer: Self-pay | Admitting: *Deleted

## 2017-07-08 ENCOUNTER — Inpatient Hospital Stay: Payer: Managed Care, Other (non HMO) | Attending: Hematology and Oncology

## 2017-07-08 ENCOUNTER — Ambulatory Visit: Payer: Managed Care, Other (non HMO)

## 2017-07-08 ENCOUNTER — Other Ambulatory Visit: Payer: Self-pay

## 2017-07-08 ENCOUNTER — Inpatient Hospital Stay: Payer: Managed Care, Other (non HMO)

## 2017-07-08 ENCOUNTER — Inpatient Hospital Stay (HOSPITAL_BASED_OUTPATIENT_CLINIC_OR_DEPARTMENT_OTHER): Payer: Managed Care, Other (non HMO) | Admitting: Hematology and Oncology

## 2017-07-08 VITALS — BP 116/65 | HR 75 | Temp 96.1°F | Resp 18 | Wt 188.2 lb

## 2017-07-08 DIAGNOSIS — F17210 Nicotine dependence, cigarettes, uncomplicated: Secondary | ICD-10-CM | POA: Insufficient documentation

## 2017-07-08 DIAGNOSIS — K219 Gastro-esophageal reflux disease without esophagitis: Secondary | ICD-10-CM

## 2017-07-08 DIAGNOSIS — R197 Diarrhea, unspecified: Secondary | ICD-10-CM | POA: Diagnosis not present

## 2017-07-08 DIAGNOSIS — Z79899 Other long term (current) drug therapy: Secondary | ICD-10-CM

## 2017-07-08 DIAGNOSIS — C20 Malignant neoplasm of rectum: Secondary | ICD-10-CM

## 2017-07-08 DIAGNOSIS — Z801 Family history of malignant neoplasm of trachea, bronchus and lung: Secondary | ICD-10-CM | POA: Diagnosis not present

## 2017-07-08 DIAGNOSIS — C7802 Secondary malignant neoplasm of left lung: Secondary | ICD-10-CM

## 2017-07-08 DIAGNOSIS — C787 Secondary malignant neoplasm of liver and intrahepatic bile duct: Secondary | ICD-10-CM | POA: Insufficient documentation

## 2017-07-08 DIAGNOSIS — Z9221 Personal history of antineoplastic chemotherapy: Secondary | ICD-10-CM | POA: Diagnosis not present

## 2017-07-08 DIAGNOSIS — Z803 Family history of malignant neoplasm of breast: Secondary | ICD-10-CM | POA: Diagnosis not present

## 2017-07-08 DIAGNOSIS — I10 Essential (primary) hypertension: Secondary | ICD-10-CM | POA: Insufficient documentation

## 2017-07-08 DIAGNOSIS — E876 Hypokalemia: Secondary | ICD-10-CM | POA: Diagnosis not present

## 2017-07-08 DIAGNOSIS — Z8701 Personal history of pneumonia (recurrent): Secondary | ICD-10-CM | POA: Diagnosis not present

## 2017-07-08 DIAGNOSIS — R531 Weakness: Secondary | ICD-10-CM | POA: Diagnosis not present

## 2017-07-08 DIAGNOSIS — Z923 Personal history of irradiation: Secondary | ICD-10-CM | POA: Diagnosis not present

## 2017-07-08 LAB — COMPREHENSIVE METABOLIC PANEL
ALT: 13 U/L — ABNORMAL LOW (ref 14–54)
AST: 18 U/L (ref 15–41)
Albumin: 3.6 g/dL (ref 3.5–5.0)
Alkaline Phosphatase: 87 U/L (ref 38–126)
Anion gap: 6 (ref 5–15)
BUN: 10 mg/dL (ref 6–20)
CO2: 27 mmol/L (ref 22–32)
Calcium: 8.6 mg/dL — ABNORMAL LOW (ref 8.9–10.3)
Chloride: 100 mmol/L — ABNORMAL LOW (ref 101–111)
Creatinine, Ser: 0.7 mg/dL (ref 0.44–1.00)
GFR calc Af Amer: >60 mL/min (ref >60)
GFR calc non Af Amer: >60 mL/min (ref >60)
Glucose, Bld: 112 mg/dL — ABNORMAL HIGH (ref 65–99)
Potassium: 3.3 mmol/L — ABNORMAL LOW (ref 3.5–5.1)
Sodium: 133 mmol/L — ABNORMAL LOW (ref 135–145)
Total Bilirubin: 0.4 mg/dL (ref 0.3–1.2)
Total Protein: 6.4 g/dL — ABNORMAL LOW (ref 6.5–8.1)

## 2017-07-08 LAB — C DIFFICILE QUICK SCREEN W PCR REFLEX
C Diff antigen: NEGATIVE
C Diff interpretation: NOT DETECTED
C Diff toxin: NEGATIVE

## 2017-07-08 LAB — CBC WITH DIFFERENTIAL/PLATELET
Basophils Absolute: 0 10*3/uL (ref 0–0.1)
Basophils Relative: 0 %
Eosinophils Absolute: 0.2 10*3/uL (ref 0–0.7)
Eosinophils Relative: 5 %
HCT: 35.2 % (ref 35.0–47.0)
Hemoglobin: 12.4 g/dL (ref 12.0–16.0)
Lymphocytes Relative: 14 %
Lymphs Abs: 0.6 10*3/uL — ABNORMAL LOW (ref 1.0–3.6)
MCH: 33.8 pg (ref 26.0–34.0)
MCHC: 35.3 g/dL (ref 32.0–36.0)
MCV: 95.7 fL (ref 80.0–100.0)
Monocytes Absolute: 0.3 10*3/uL (ref 0.2–0.9)
Monocytes Relative: 6 %
Neutro Abs: 3.4 10*3/uL (ref 1.4–6.5)
Neutrophils Relative %: 75 %
Platelets: 190 10*3/uL (ref 150–440)
RBC: 3.68 MIL/uL — ABNORMAL LOW (ref 3.80–5.20)
RDW: 15.5 % — ABNORMAL HIGH (ref 11.5–14.5)
WBC: 4.6 10*3/uL (ref 3.6–11.0)

## 2017-07-08 LAB — MAGNESIUM: Magnesium: 2 mg/dL (ref 1.7–2.4)

## 2017-07-08 MED ORDER — DIPHENOXYLATE-ATROPINE 2.5-0.025 MG PO TABS: 1.0000 | ORAL_TABLET | Freq: Four times a day (QID) | ORAL | 0 refills | Status: DC | PRN

## 2017-07-08 MED ORDER — POTASSIUM CHLORIDE CRYS ER 20 MEQ PO TBCR: 20.0000 meq | EXTENDED_RELEASE_TABLET | Freq: Every day | ORAL | 0 refills | Status: DC

## 2017-07-08 NOTE — Progress Notes (Signed)
[facility] day:  07/08/2017  Chief Complaint: Tamara Hall is a 61 y.o. female with stage IV rectal cancer who is seen for assessment on week #3 concurrent Xeloda and radiation.  HPI:  The patient was last seen in the medical oncology clinic on 06/30/2017.  At that time, her reflux was better with the Prilosec. She described some minor rectal bleeding described as spotting.  Exam revealed audible wheezing.  She was referred to the ER for management.  She was diagnosed with acute bronchitis.  She received nebulized albuterol.  She was prescribed azithromycin, prednisone taper, and an albuterol inhaler.  She has continued to receive daily radiation (Monday - Friday) and Xeloda.  She did not receive radiation yesterday.  She describes significant diarrhea.  She had 27 bowel movements on Saturday, 07/05/2017, and 14 bowel movements on 07/06/2017.  She is using Imodium 4/day.  She had one episode of emesis yesterday.  She is on the [employer].  Eating causes diarrhea.  She denies any dizziness or lightheadedness.  She notes cramping in her hands and legs.  She is taking Sitz baths and using Desitin powder.  Stools has some blood and mucus.   Past Medical History:  Diagnosis Date  . Cancer ([place])   . Hypertension   . Pneumonia    20+ years ago    Past Surgical History:  Procedure Laterality Date  . ABDOMINAL HYSTERECTOMY    . BREAST SURGERY    . COLONOSCOPY WITH PROPOFOL N/A 01/14/2017   Procedure: COLONOSCOPY WITH PROPOFOL;  Surgeon: Lucilla Lame, MD;  Location: [facility] ENDOSCOPY;  Service: Endoscopy;  Laterality: N/A;  . ELBOW SURGERY Right    Has had 4 surgeries on right elbow.  Marland Kitchen HAND SURGERY Right 2008  . JOINT REPLACEMENT    . PORTA CATH INSERTION N/A 01/29/2017   Procedure: Glori Luis Cath Insertion;  Surgeon: Algernon Huxley, MD;  Location: [facility];  Service: Cardiovascular;  Laterality: N/A;  . TONSILLECTOMY      Family History   Problem Relation Age of Onset  . Lung cancer Mother   . Lung cancer Father   . Brain cancer Maternal Grandmother     Social History:  reports that she has been smoking Cigarettes.  She has a 80.00 pack-year smoking history. She has never used smokeless tobacco. She reports that she does not drink alcohol or use drugs.  She lives in [place].  She has 3 children (ages 12, 74, and 56).  She smokes 2 packs a day.  She started smoking at age 48.  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  [name] is her significant other.  She is alone today.  Allergies: No Known Allergies  Current Medications: Current Outpatient Prescriptions  Medication Sig Dispense Refill  . albuterol (PROVENTIL HFA;VENTOLIN HFA) 108 (90 Base) MCG/ACT inhaler Inhale 2 puffs into the lungs every 6 (six) hours as needed for wheezing or shortness of breath. 1 Inhaler 2  . busPIRone (BUSPAR) 7.5 MG tablet Take 7.5 mg by mouth.    . capecitabine (XELODA) 150 MG tablet Take one 150 mg pill twice a day for 5 days each week during radiation with three 500 mg tablets 50 tablet 0  . capecitabine (XELODA) 500 MG tablet Take three  500 mg pills twice a day for 5 days each week during radiation with one 150 mg tablet 150 tablet  0  . omeprazole (PRILOSEC) 20 MG capsule Take 1 capsule (20 mg total) by mouth daily. 30 capsule 0  . ondansetron (ZOFRAN) 8 MG tablet Take 1 tablet (8 mg total) by mouth every 8 (eight) hours as needed. 30 tablet 2  . acetaminophen (TYLENOL) 325 MG tablet Take 2 tablets (650 mg total) by mouth every 6 (six) hours as needed for mild pain (or Fever >/= 101). (Patient not taking: Reported on 06/30/2017)    . diphenoxylate-atropine (LOMOTIL) 2.5-0.025 MG tablet Take 1 tablet by mouth 4 (four) times daily as needed for diarrhea or loose stools. 30 tablet 0  . Hydrocodone-Acetaminophen 7.5-300 MG TABS Take 1 tablet by mouth every 6 (six) hours as needed (pain). 30 each 0   . lidocaine-prilocaine (EMLA) cream APPLY A SMALL AMOUNT TO PORT SITE AT LEAST 1 HOUR BEFORE BEING ACCESSED. COVER IN PLASTIC WRAP. (Patient not taking: Reported on 06/30/2017) 30 g 1  . LORazepam (ATIVAN) 0.5 MG tablet Take 1 tablet (0.5 mg total) by mouth every 6 (six) hours as needed. For nausea or vomiting (Patient not taking: Reported on 06/30/2017) 30 tablet 1  . Magnesium Hydroxide (MILK OF MAGNESIA PO) Take 30 mLs by mouth at bedtime.     . potassium chloride SA (K-DUR,KLOR-CON) 20 MEQ tablet Take 1 tablet (20 mEq total) by mouth daily. 20 tablet 0   No current facility-administered medications for this visit.     Review of Systems:  GENERAL:  Fatigue.  No fevers or sweats.  Weight up 2 pounds. PERFORMANCE STATUS (ECOG):  1 HEENT:  No visual changes, runny nose, sore throat, mouth sores or tenderness. Lungs:  No shortness of breath. No cough.  No hemoptysis. Cardiac:  No chest pain, palpitations, orthopnea, or PND.  Elevated BP due to anxiety. GI:  Diarrhea (see HPI).  Nausea.  Emesis x 1 yesterday.  Noconstipation or melena.  Some rectal bleeding and mucus. GU:  No urgency, frequency, dysuria, or hematuria. Musculoskeletal:  No back pain.  No joint pain.  No muscle tenderness. Extremities:  Cramping in hands and legs.  No pain or swelling. Skin:  No rashes or skin changes. Neuro:  No headache, numbness or weakness, balance or coordination issues. Endocrine:  No diabetes, thyroid issues, hot flashes or night sweats. Psych:  Stress/anxiety.  No depression. Pain: No pain. Review of systems:  All other systems reviewed and found to be negative.  Physical Exam: Blood pressure 116/65, pulse 75, temperature (!) 96.1 F (35.6 C), temperature source Tympanic, resp. rate 18, weight 188 lb 3 oz (85.4 kg). GENERAL:  Slightly fatigued appearing woman sitting comfortably in the oncology clinic in no acute distress.   MENTAL STATUS:  Alert and oriented to person, place and time. HEAD:Long  grayhair in a pony tail. Normocephalic, atraumatic, face symmetric, no Cushingoid features. EYES:Blueeyes. Pupils equal round and reactive to light and accomodation. No conjunctivitis or scleral icterus. EHO:ZYYQMGNOIB clear without lesion. Tonguenormal. Mucous membranes moist. RESPIRATORY:Clear to auscultation without rales, wheezes or rhonchi. CARDIOVASCULAR:Regular rate andrhythmwithout murmur, rub or gallop. ABDOMEN:Soft, non-tender with active bowel soundsand no hepatosplenomegaly. No masses. RECTAL;  No skin breakdown.  External hemorrhoids. SKIN: No palmar or plantar skin changes.  No rashes, ulcers or lesions. EXTREMITIES: No edema, no skin discoloration or tenderness. No palpable cords. LYMPHNODES: No palpable cervical, supraclavicular, axillary or inguinal adenopathy  NEUROLOGICAL: Unremarkable. PSYCH: Appropriate.   Appointment on 07/08/2017  Component Date Value Ref Range Status  . Magnesium 07/08/2017 2.0  1.7 - 2.4 mg/dL Final  .  C Diff antigen 07/08/2017 NEGATIVE  NEGATIVE Final  . C Diff toxin 07/08/2017 NEGATIVE  NEGATIVE Final  . C Diff interpretation 07/08/2017 No C. difficile detected.   Final  Orders Only on 07/08/2017  Component Date Value Ref Range Status  . WBC 07/08/2017 4.6  3.6 - 11.0 K/uL Final  . RBC 07/08/2017 3.68* 3.80 - 5.20 MIL/uL Final  . Hemoglobin 07/08/2017 12.4  12.0 - 16.0 g/dL Final  . HCT 07/08/2017 35.2  35.0 - 47.0 % Final  . MCV 07/08/2017 95.7  80.0 - 100.0 fL Final  . MCH 07/08/2017 33.8  26.0 - 34.0 pg Final  . MCHC 07/08/2017 35.3  32.0 - 36.0 g/dL Final  . RDW 07/08/2017 15.5* 11.5 - 14.5 % Final  . Platelets 07/08/2017 190  150 - 440 K/uL Final  . Neutrophils Relative % 07/08/2017 75  % Final  . Neutro Abs 07/08/2017 3.4  1.4 - 6.5 K/uL Final  . Lymphocytes Relative 07/08/2017 14  % Final  . Lymphs Abs 07/08/2017 0.6* 1.0 - 3.6 K/uL Final  . Monocytes Relative 07/08/2017 6  % Final  . Monocytes Absolute  07/08/2017 0.3  0.2 - 0.9 K/uL Final  . Eosinophils Relative 07/08/2017 5  % Final  . Eosinophils Absolute 07/08/2017 0.2  0 - 0.7 K/uL Final  . Basophils Relative 07/08/2017 0  % Final  . Basophils Absolute 07/08/2017 0.0  0 - 0.1 K/uL Final  . Sodium 07/08/2017 133* 135 - 145 mmol/L Final  . Potassium 07/08/2017 3.3* 3.5 - 5.1 mmol/L Final  . Chloride 07/08/2017 100* 101 - 111 mmol/L Final  . CO2 07/08/2017 27  22 - 32 mmol/L Final  . Glucose, Bld 07/08/2017 112* 65 - 99 mg/dL Final  . BUN 07/08/2017 10  6 - 20 mg/dL Final  . Creatinine, Ser 07/08/2017 0.70  0.44 - 1.00 mg/dL Final  . Calcium 07/08/2017 8.6* 8.9 - 10.3 mg/dL Final  . Total Protein 07/08/2017 6.4* 6.5 - 8.1 g/dL Final  . Albumin 07/08/2017 3.6  3.5 - 5.0 g/dL Final  . AST 07/08/2017 18  15 - 41 U/L Final  . ALT 07/08/2017 13* 14 - 54 U/L Final  . Alkaline Phosphatase 07/08/2017 87  38 - 126 U/L Final  . Total Bilirubin 07/08/2017 0.4  0.3 - 1.2 mg/dL Final  . GFR calc non Af Amer 07/08/2017 >60  >60 mL/min Final  . GFR calc Af Amer 07/08/2017 >60  >60 mL/min Final   Comment: (NOTE) The eGFR has been calculated using the CKD EPI equation. This calculation has not been validated in all clinical situations. eGFR's persistently <60 mL/min signify possible Chronic Kidney Disease.   . Anion gap 07/08/2017 6  5 - 15 Final    Assessment:  Tamara Hall is a 61 y.o. female with clinical stage T3N1M1 rectal cancer.   She presented with a 9 month history of rectal bleeding.  Colonoscopy on 01/14/2017 revealed a frond-like/villous non-obstructing large mass in the rectum. The mass was non-circumferential.  Biopsies revealed high grade dysplasia within an adenoma (? sampling error).  CEA was 31.6 on 01/15/2017.  Abdomen and pelvic CT on 01/13/2017 revealed irregular thickening of the rectum suspicious for carcinoma.  There was a 1.6 cm hypoattenuating lesion in the right hepatic lobe worrisome for metastatic disease.  Chest CT  on 01/15/2017 revealed a 9 mm noncalcified left lower lobe pulmonary nodule (new).  Pelvic MRI on 01/16/2017 revealed rectal adenocarcinoma T3N1.  There was extension beyond the muscularis  propria (17 mm).  There were mesorectal nodes >= 5 mm.  There was adenopathy on the left side of the mesorectum, including a 9 mm lesion.  The distance from the tumor to the anal sphincter was 8 mm.  PET scan on 01/27/2017 revealed intense metabolic activity associated rectal mass consists with primary rectal carcinoma.  There was a hypermetabolic metastatic lesion to the central LEFT hepatic lobe.  There was metabolic activity associated with small LEFT lower lobe nodule which was most consistent with pulmonary metastasis.  Liver MRI on 01/31/2017 revealed a 2.6 x 2.7 x 2.1 cm lesion in segment VIII of the liver near the junction with segment IVa c/w a metastatic lesion.  There was a 1.1 x 0.8 cm suspected metastatic lesion peripherally in segment VI of the liver.  CT guided liver biopsy at [facility] on 02/07/2017 revealed malignant cells c/w metastatic colorectal adenocarcinoma.  Tumor was sent for  MMR/MSI, KRAS/NRAS, and BRAF.  She is scheduled to have liver directed therapy (ablation) at [facility] on 03/19/2017.  CEA has been followed: 31.6 on 01/15/2017, 38.1 on 02/18/2017, 25.1 on 03/18/2017, 11.8 on 04/08/2017, 9.2 on 04/22/2017, 9.5 on 05/20/2017, and 9.6 on 06/19/2017.  She is s/p 2 cycle of FOLFOX  (02/18/2017 - 03/04/2017).  She developed transient cold induced neuropathy.  She developed wheezing and hypoxia with cycle #2 felt secondary to oxaliplatin.  She was treated with oxygen by face mask, Benadryl, hydrocortisone, and nebulized albuterol.    She is s/p 4 cycles of FOLFIRI (03/24/2017 - 04/22/2017) with Neulasta support.   Abdomen and pelvic CT on 05/16/2017 revealed persistent but decreased asymmetric wall thickening in the rectum.  The medial segment left liver lesion was smaller (2.7 x 2.6 cm to 1.9 x 1.6  cm). A tiny lesion seen in segment VI of the liver on previous MRI was not evident on today's CT scan.  The left lower lobe pulmonary nodule was smaller (9 mm to 6 mm).  There was no new or progressive findings in the abdomen and pelvis.  She underwent CT guided microwave ablation of the liver lesion on 06/06/2017.    She began radiation on 06/18/2017.  She receives daily Xeloda with radiation. She missed treatment on 06/23/2017, 06/26/2017, and 07/07/2017.  She has an 80 pack year smoking history.  She is smoking.  She was treated for bronchitis and reactive airway disease on 06/30/2017.  She completed azithromycin and a prednisone pulse.  Symptomatically, she has grade III diarrhea.  Exam is stable.  She is not orthostatic.  She has hypokalemia.  Plan: 1.  Labs today: CBC with diff, CMP, Mg. 2.  Hold Xeloda.  3.  Hold radiation.  Notify radiation oncology. 4.  Stool for C diff and GI panel by PCR (ensure no infectious etiology). 5.  Discuss importance of fluids, caloric intake, and management of diarrhea. 6.  Rx: Lomotil. 7.  Rx: potassium chloride 20 meq po BID today then 20 meq a day. 8.  RTC on 07/11/2017 for MD assessment and labs (BMP).  Addendum:  Unable to provide patient with IVF and potassium in infusion center until infectious diarrhea ruled out.  Patient feels comfortable managing diarrhea at home.  She is to call if diarrhea persists, dehydration or any concerns.   Lequita Asal, MD  07/08/2017

## 2017-07-08 NOTE — Progress Notes (Signed)
Patient states she has had massive diarrhea over the past week.  States on Saturday she had 27 stools. On Sunday she had 15 stools.  She is taking imodium 4 X day. States her legs and hands are cramping badly.  Patient is eating crackers, rice, bananas.  Not eating anything fried, spicy, fruit, lettuce, etc.

## 2017-07-09 ENCOUNTER — Ambulatory Visit: Payer: Managed Care, Other (non HMO)

## 2017-07-09 ENCOUNTER — Telehealth: Payer: Self-pay | Admitting: *Deleted

## 2017-07-09 NOTE — Telephone Encounter (Signed)
Called patient to inform her that c-diff is negative.  Patient states her diarrhea is better.

## 2017-07-09 NOTE — Telephone Encounter (Signed)
-----   Message from Lequita Asal, MD sent at 07/08/2017  4:22 PM EDT ----- Regarding: C diff negative  She can go to the infusion center when she needs to!  ----- Message ----- From: [place]: 07/08/2017   2:53 PM To: Lequita Asal, MD

## 2017-07-10 ENCOUNTER — Ambulatory Visit: Payer: Managed Care, Other (non HMO)

## 2017-07-10 ENCOUNTER — Inpatient Hospital Stay: Payer: Managed Care, Other (non HMO)

## 2017-07-10 ENCOUNTER — Inpatient Hospital Stay: Payer: Managed Care, Other (non HMO) | Admitting: Hematology and Oncology

## 2017-07-11 ENCOUNTER — Ambulatory Visit: Payer: Managed Care, Other (non HMO)

## 2017-07-11 ENCOUNTER — Inpatient Hospital Stay: Payer: Managed Care, Other (non HMO) | Attending: Hematology and Oncology

## 2017-07-11 ENCOUNTER — Other Ambulatory Visit: Payer: Self-pay | Admitting: *Deleted

## 2017-07-11 ENCOUNTER — Inpatient Hospital Stay (HOSPITAL_BASED_OUTPATIENT_CLINIC_OR_DEPARTMENT_OTHER): Payer: Managed Care, Other (non HMO) | Admitting: Hematology and Oncology

## 2017-07-11 VITALS — BP 148/74 | HR 81 | Temp 96.7°F | Resp 18 | Wt 184.4 lb

## 2017-07-11 DIAGNOSIS — C20 Malignant neoplasm of rectum: Secondary | ICD-10-CM

## 2017-07-11 DIAGNOSIS — Z8701 Personal history of pneumonia (recurrent): Secondary | ICD-10-CM

## 2017-07-11 DIAGNOSIS — T451X5A Adverse effect of antineoplastic and immunosuppressive drugs, initial encounter: Secondary | ICD-10-CM

## 2017-07-11 DIAGNOSIS — Z808 Family history of malignant neoplasm of other organs or systems: Secondary | ICD-10-CM

## 2017-07-11 DIAGNOSIS — F17210 Nicotine dependence, cigarettes, uncomplicated: Secondary | ICD-10-CM | POA: Insufficient documentation

## 2017-07-11 DIAGNOSIS — R634 Abnormal weight loss: Secondary | ICD-10-CM | POA: Insufficient documentation

## 2017-07-11 DIAGNOSIS — K521 Toxic gastroenteritis and colitis: Secondary | ICD-10-CM

## 2017-07-11 DIAGNOSIS — I10 Essential (primary) hypertension: Secondary | ICD-10-CM | POA: Insufficient documentation

## 2017-07-11 DIAGNOSIS — Z79899 Other long term (current) drug therapy: Secondary | ICD-10-CM | POA: Diagnosis not present

## 2017-07-11 DIAGNOSIS — R197 Diarrhea, unspecified: Secondary | ICD-10-CM | POA: Diagnosis not present

## 2017-07-11 DIAGNOSIS — Z801 Family history of malignant neoplasm of trachea, bronchus and lung: Secondary | ICD-10-CM | POA: Diagnosis not present

## 2017-07-11 DIAGNOSIS — C7802 Secondary malignant neoplasm of left lung: Secondary | ICD-10-CM

## 2017-07-11 DIAGNOSIS — R208 Other disturbances of skin sensation: Secondary | ICD-10-CM | POA: Diagnosis not present

## 2017-07-11 DIAGNOSIS — C787 Secondary malignant neoplasm of liver and intrahepatic bile duct: Secondary | ICD-10-CM

## 2017-07-11 LAB — BASIC METABOLIC PANEL
Anion gap: 8 (ref 5–15)
BUN: 12 mg/dL (ref 6–20)
CO2: 26 mmol/L (ref 22–32)
Calcium: 8.9 mg/dL (ref 8.9–10.3)
Chloride: 103 mmol/L (ref 101–111)
Creatinine, Ser: 0.65 mg/dL (ref 0.44–1.00)
GFR calc Af Amer: >60 mL/min (ref >60)
GFR calc non Af Amer: >60 mL/min (ref >60)
Glucose, Bld: 119 mg/dL — ABNORMAL HIGH (ref 65–99)
Potassium: 4.1 mmol/L (ref 3.5–5.1)
Sodium: 137 mmol/L (ref 135–145)

## 2017-07-11 MED ORDER — HYDROCODONE-ACETAMINOPHEN 7.5-300 MG PO TABS: 1.0000 | ORAL_TABLET | Freq: Four times a day (QID) | ORAL | 0 refills | Status: DC | PRN

## 2017-07-11 NOTE — Progress Notes (Signed)
[facility] day:  07/11/2017   Chief Complaint: Tamara Hall is a 61 y.o. female with stage IV rectal cancer who is seen for reassessment after holding Xeloda for grade III diarrhea.  HPI:  The patient was last seen in the medical oncology clinic on 07/08/2017.  At that time, she described grade III diarrhea (27 stools/day).  Potassium was 3.3.  Hydration was well maintained.  C diff was negative.  GI pathogen panel is pending.  Fluids were encouraged.  We discussed a BRAT diet.  Potassium was prescribed.  Xeloda and radiation were held.  We discussed the use of Imodium.  A prescription for Lomotil was provided.  Symptomatically, patient's diarrhea has markedly improved with the use of Lomotil. She is only stooling 4 times a day, as compared to the 25. Prior to starting her Xeloda and radiation, patient would have a formed bowel movement 3 times a day. Patient's radiation is currently on hold. She will see Dr. Baruch Gouty again on 07/15/2017 for reassessment. Patient is maintaining a BRAT diet. She has lost 4 pounds. Patient is having some external burning around her vagina. She states, "I am raw from the radiation".   Past Medical History:  Diagnosis Date  . Cancer ([place])   . Hypertension   . Pneumonia    20+ years ago    Past Surgical History:  Procedure Laterality Date  . ABDOMINAL HYSTERECTOMY    . BREAST SURGERY    . COLONOSCOPY WITH PROPOFOL N/A 01/14/2017   Procedure: COLONOSCOPY WITH PROPOFOL;  Surgeon: Lucilla Lame, MD;  Location: [facility] ENDOSCOPY;  Service: Endoscopy;  Laterality: N/A;  . ELBOW SURGERY Right    Has had 4 surgeries on right elbow.  Marland Kitchen HAND SURGERY Right 2008  . JOINT REPLACEMENT    . PORTA CATH INSERTION N/A 01/29/2017   Procedure: Glori Luis Cath Insertion;  Surgeon: Algernon Huxley, MD;  Location: [facility];  Service: Cardiovascular;  Laterality: N/A;  . TONSILLECTOMY      Family History  Problem Relation Age of Onset   . Lung cancer Mother   . Lung cancer Father   . Brain cancer Maternal Grandmother     Social History:  reports that she has been smoking Cigarettes.  She has a 80.00 pack-year smoking history. She has never used smokeless tobacco. She reports that she does not drink alcohol or use drugs.  She lives in [place].  She has 3 children (ages 49, 23, and 56).  She smokes 2 packs a day.  She started smoking at age 37.  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  [name] is her significant other.  She is alone today.  Allergies: No Known Allergies  Current Medications: Current Outpatient Prescriptions  Medication Sig Dispense Refill  . albuterol (PROVENTIL HFA;VENTOLIN HFA) 108 (90 Base) MCG/ACT inhaler Inhale 2 puffs into the lungs every 6 (six) hours as needed for wheezing or shortness of breath. 1 Inhaler 2  . busPIRone (BUSPAR) 7.5 MG tablet Take 7.5 mg by mouth.    . capecitabine (XELODA) 150 MG tablet Take one 150 mg pill twice a day for 5 days each week during radiation with three 500 mg tablets 50 tablet 0  . capecitabine (XELODA) 500 MG tablet Take three  500 mg pills twice a day for 5 days each week during radiation with one 150 mg tablet 150 tablet 0  .  diphenoxylate-atropine (LOMOTIL) 2.5-0.025 MG tablet Take 1 tablet by mouth 4 (four) times daily as needed for diarrhea or loose stools. 30 tablet 0  . Hydrocodone-Acetaminophen 7.5-300 MG TABS Take 1 tablet by mouth every 6 (six) hours as needed (pain). 30 each 0  . Magnesium Hydroxide (MILK OF MAGNESIA PO) Take 30 mLs by mouth at bedtime.     Marland Kitchen omeprazole (PRILOSEC) 20 MG capsule Take 1 capsule (20 mg total) by mouth daily. 30 capsule 0  . ondansetron (ZOFRAN) 8 MG tablet Take 1 tablet (8 mg total) by mouth every 8 (eight) hours as needed. 30 tablet 2  . potassium chloride SA (K-DUR,KLOR-CON) 20 MEQ tablet Take 1 tablet (20 mEq total) by mouth daily. 20 tablet 0  . acetaminophen (TYLENOL)  325 MG tablet Take 2 tablets (650 mg total) by mouth every 6 (six) hours as needed for mild pain (or Fever >/= 101). (Patient not taking: Reported on 06/30/2017)    . lidocaine-prilocaine (EMLA) cream APPLY A SMALL AMOUNT TO PORT SITE AT LEAST 1 HOUR BEFORE BEING ACCESSED. COVER IN PLASTIC WRAP. (Patient not taking: Reported on 06/30/2017) 30 g 1  . LORazepam (ATIVAN) 0.5 MG tablet Take 1 tablet (0.5 mg total) by mouth every 6 (six) hours as needed. For nausea or vomiting (Patient not taking: Reported on 06/30/2017) 30 tablet 1   No current facility-administered medications for this visit.     Review of Systems:  GENERAL:  Feels "better".  No fevers or sweats.  Weight down 4 pounds. PERFORMANCE STATUS (ECOG):  1 HEENT:  No visual changes, runny nose, sore throat, mouth sores or tenderness. Lungs:  No shortness of breath. No cough.  No hemoptysis. Cardiac:  No chest pain, palpitations, orthopnea, or PND.  Elevated BP due to anxiety. GI:  Diarrhea, improved.  No nausea or vomiting.  No constipation or melena.  Some rectal bleeding. GU:  No urgency, frequency, dysuria, or hematuria.  External irritation secondary to radiation. Musculoskeletal:  No back pain.  No joint pain.  No muscle tenderness. Extremities:  Resolution of cramps.  No pain or swelling. Skin:  No rashes or skin changes. Neuro:  No headache, numbness or weakness, balance or coordination issues. Endocrine:  No diabetes, thyroid issues, hot flashes or night sweats. Psych:  Stress/anxiety.  No depression. Pain: No pain. Review of systems:  All other systems reviewed and found to be negative.  Physical Exam: Blood pressure (!) 148/74, pulse 81, temperature (!) 96.7 F (35.9 C), temperature source Tympanic, resp. rate 18, weight 184 lb 6 oz (83.6 kg). GENERAL:  Well developed, well nourished, woman sitting comfortably in the oncology clinic in no acute distress.   MENTAL STATUS:  Alert and oriented to person, place and  time. HEAD:Long grayhair in a pony tail. Normocephalic, atraumatic, face symmetric, no Cushingoid features. EYES:Blueeyes. Pupils equal round and reactive to light and accomodation. No conjunctivitis or scleral icterus. IHK:[id number] clear without lesion. Tonguenormal. Mucous membranes moist. RESPIRATORY:Clear to ausculation without rales or rhonchi. CARDIOVASCULAR:Regular rate andrhythmwithout murmur, rub or gallop. ABDOMEN: Soft, non-tender with active bowel soundsand no hepatosplenomegaly. No masses. SKIN: No palmar changes.  No rashes, ulcers or lesions. EXTREMITIES: No edema, no skin discoloration or tenderness. No palpable cords. LYMPHNODES: No palpable cervical, supraclavicular, axillary or inguinal adenopathy  NEUROLOGICAL: Unremarkable. PSYCH: Appropriate.   Appointment on 07/11/2017  Component Date Value Ref Range Status  . Sodium 07/11/2017 137  135 - 145 mmol/L Final  . Potassium 07/11/2017 4.1  3.5 - 5.1 mmol/L Final  .  Chloride 07/11/2017 103  101 - 111 mmol/L Final  . CO2 07/11/2017 26  22 - 32 mmol/L Final  . Glucose, Bld 07/11/2017 119* 65 - 99 mg/dL Final  . BUN 07/11/2017 12  6 - 20 mg/dL Final  . Creatinine, Ser 07/11/2017 0.65  0.44 - 1.00 mg/dL Final  . Calcium 07/11/2017 8.9  8.9 - 10.3 mg/dL Final  . GFR calc non Af Amer 07/11/2017 >60  >60 mL/min Final  . GFR calc Af Amer 07/11/2017 >60  >60 mL/min Final   Comment: (NOTE) The eGFR has been calculated using the CKD EPI equation. This calculation has not been validated in all clinical situations. eGFR's persistently <60 mL/min signify possible Chronic Kidney Disease.   . Anion gap 07/11/2017 8  5 - 15 Final    Assessment:  TEMIKA SUTPHIN is a 61 y.o. female with clinical stage T3N1M1 rectal cancer.   She presented with a 9 month history of rectal bleeding.  Colonoscopy on 01/14/2017 revealed a frond-like/villous non-obstructing large mass in the rectum. The mass was  non-circumferential.  Biopsies revealed high grade dysplasia within an adenoma (? sampling error).  CEA was 31.6 on 01/15/2017.  Abdomen and pelvic CT on 01/13/2017 revealed irregular thickening of the rectum suspicious for carcinoma.  There was a 1.6 cm hypoattenuating lesion in the right hepatic lobe worrisome for metastatic disease.  Chest CT on 01/15/2017 revealed a 9 mm noncalcified left lower lobe pulmonary nodule (new).  Pelvic MRI on 01/16/2017 revealed rectal adenocarcinoma T3N1.  There was extension beyond the muscularis propria (17 mm).  There were mesorectal nodes >= 5 mm.  There was adenopathy on the left side of the mesorectum, including a 9 mm lesion.  The distance from the tumor to the anal sphincter was 8 mm.  PET scan on 01/27/2017 revealed intense metabolic activity associated rectal mass consists with primary rectal carcinoma.  There was a hypermetabolic metastatic lesion to the central LEFT hepatic lobe.  There was metabolic activity associated with small LEFT lower lobe nodule which was most consistent with pulmonary metastasis.  Liver MRI on 01/31/2017 revealed a 2.6 x 2.7 x 2.1 cm lesion in segment VIII of the liver near the junction with segment IVa c/w a metastatic lesion.  There was a 1.1 x 0.8 cm suspected metastatic lesion peripherally in segment VI of the liver.  CT guided liver biopsy at [facility] on 02/07/2017 revealed malignant cells c/w metastatic colorectal adenocarcinoma.  Tumor was sent for  MMR/MSI, KRAS/NRAS, and BRAF.  She is scheduled to have liver directed therapy (ablation) at [facility] on 03/19/2017.  CEA has been followed: 31.6 on 01/15/2017, 38.1 on 02/18/2017, 25.1 on 03/18/2017, 11.8 on 04/08/2017, 9.2 on 04/22/2017, 9.5 on 05/20/2017, and 9.6 on 06/19/2017.  She is s/p 2 cycle of FOLFOX  (02/18/2017 - 03/04/2017).  She developed transient cold induced neuropathy.  She developed wheezing and hypoxia with cycle #2 felt secondary to oxaliplatin.  She was treated with  oxygen by face mask, Benadryl, hydrocortisone, and nebulized albuterol.    She is s/p 4 cycles of FOLFIRI (03/24/2017 - 04/22/2017) with Neulasta support.  Diarrhea was controlled with Imodium.  Abdomen and pelvic CT on 05/16/2017 revealed persistent but decreased asymmetric wall thickening in the rectum.  The medial segment left liver lesion was smaller (2.7 x 2.6 cm to 1.9 x 1.6 cm). A tiny lesion seen in segment VI of the liver on previous MRI was not evident on today's CT scan.  The left lower lobe  pulmonary nodule was smaller (9 mm to 6 mm).  There was no new or progressive findings in the abdomen and pelvis.  She underwent CT guided microwave ablation of the liver lesion on 06/06/2017.    She began radiation on 06/18/2017.  She receives daily Xeloda with radiation. She missed treatment on 06/23/2017 and 06/26/2017.  She developed grade III diarrhea.  Radiation has been on hold since 07/07/2017.  She is on Lomotil.  She has an 80 pack year smoking history.  She is smoking.  She was treated for bronchitis and reactive airway disease on 06/30/2017.  She completed azithromycin and a prednisone pulse.  Symptomatically, she is feeling better. Her diarrhea has resolved. She has 4 stools/day.  She has some minor burning around her vagina; describes as being "raw".  Exam is stable. Potassium 4.1.  Plan: 1.  Labs today: BMP. 2.  Discuss reinstitution of radiation and Xeloda at 25% dose reduction when diarrhea a grade 0 or 1 (< 4 stools above baseline). 3.  Potassium better despite loose stools. She is on 20 mEq of supplemental potassium daily. Will decrease to 10 mEq daily. 4.  RTC on 07/15/2017 for MD assessment and labs (CBC with diff, CMP, Mg).   Honor Loh, NP  07/11/2017, 9:39 AM   I saw and evaluated the patient, participating in the key portions of the service and reviewing pertinent diagnostic studies and records.  I reviewed the nurse practitioner's note and agree with the findings and  the plan.  The assessment and plan were discussed with the patient.  A few questions were asked by the patient and answered.   Lequita Asal, MD 07/11/2017, 9:39 AM

## 2017-07-11 NOTE — Progress Notes (Signed)
Pt returns to office today for follow up regarding diarrhea.  Which is resolving.  Pt reports four stools a day with more formed consistency.  Lomotil has helped.

## 2017-07-12 ENCOUNTER — Encounter: Payer: Self-pay | Admitting: Hematology and Oncology

## 2017-07-14 ENCOUNTER — Ambulatory Visit: Payer: Managed Care, Other (non HMO)

## 2017-07-14 ENCOUNTER — Other Ambulatory Visit: Payer: Managed Care, Other (non HMO)

## 2017-07-15 ENCOUNTER — Ambulatory Visit: Admission: RE | Admit: 2017-07-15 | Payer: Managed Care, Other (non HMO) | Source: Ambulatory Visit

## 2017-07-15 ENCOUNTER — Ambulatory Visit: Payer: Managed Care, Other (non HMO)

## 2017-07-15 ENCOUNTER — Telehealth: Payer: Self-pay | Admitting: *Deleted

## 2017-07-15 ENCOUNTER — Inpatient Hospital Stay (HOSPITAL_BASED_OUTPATIENT_CLINIC_OR_DEPARTMENT_OTHER): Payer: Managed Care, Other (non HMO) | Admitting: Hematology and Oncology

## 2017-07-15 ENCOUNTER — Inpatient Hospital Stay: Payer: Managed Care, Other (non HMO)

## 2017-07-15 ENCOUNTER — Encounter: Payer: Self-pay | Admitting: Hematology and Oncology

## 2017-07-15 ENCOUNTER — Ambulatory Visit
Admission: RE | Admit: 2017-07-15 | Discharge: 2017-07-15 | Disposition: A | Discharge status: Home / Self Care | Payer: Managed Care, Other (non HMO) | Source: Ambulatory Visit | Attending: Radiation Oncology | Admitting: Radiation Oncology

## 2017-07-15 VITALS — BP 151/72 | HR 71 | Temp 95.9°F | Resp 18 | Wt 186.6 lb

## 2017-07-15 DIAGNOSIS — Z7189 Other specified counseling: Secondary | ICD-10-CM

## 2017-07-15 DIAGNOSIS — C20 Malignant neoplasm of rectum: Secondary | ICD-10-CM

## 2017-07-15 DIAGNOSIS — Z79899 Other long term (current) drug therapy: Secondary | ICD-10-CM

## 2017-07-15 DIAGNOSIS — F17210 Nicotine dependence, cigarettes, uncomplicated: Secondary | ICD-10-CM

## 2017-07-15 DIAGNOSIS — K219 Gastro-esophageal reflux disease without esophagitis: Secondary | ICD-10-CM

## 2017-07-15 DIAGNOSIS — Z8701 Personal history of pneumonia (recurrent): Secondary | ICD-10-CM

## 2017-07-15 DIAGNOSIS — Z9221 Personal history of antineoplastic chemotherapy: Secondary | ICD-10-CM

## 2017-07-15 DIAGNOSIS — I10 Essential (primary) hypertension: Secondary | ICD-10-CM

## 2017-07-15 DIAGNOSIS — C7802 Secondary malignant neoplasm of left lung: Secondary | ICD-10-CM

## 2017-07-15 DIAGNOSIS — C787 Secondary malignant neoplasm of liver and intrahepatic bile duct: Secondary | ICD-10-CM | POA: Diagnosis not present

## 2017-07-15 DIAGNOSIS — E876 Hypokalemia: Secondary | ICD-10-CM | POA: Diagnosis not present

## 2017-07-15 DIAGNOSIS — Z923 Personal history of irradiation: Secondary | ICD-10-CM

## 2017-07-15 DIAGNOSIS — R197 Diarrhea, unspecified: Secondary | ICD-10-CM | POA: Diagnosis not present

## 2017-07-15 DIAGNOSIS — T451X5A Adverse effect of antineoplastic and immunosuppressive drugs, initial encounter: Secondary | ICD-10-CM

## 2017-07-15 DIAGNOSIS — K521 Toxic gastroenteritis and colitis: Secondary | ICD-10-CM

## 2017-07-15 LAB — COMPREHENSIVE METABOLIC PANEL
ALT: 17 U/L (ref 14–54)
AST: 26 U/L (ref 15–41)
Albumin: 3.8 g/dL (ref 3.5–5.0)
Alkaline Phosphatase: 85 U/L (ref 38–126)
Anion gap: 7 (ref 5–15)
BUN: 9 mg/dL (ref 6–20)
CO2: 28 mmol/L (ref 22–32)
Calcium: 9.1 mg/dL (ref 8.9–10.3)
Chloride: 104 mmol/L (ref 101–111)
Creatinine, Ser: 0.71 mg/dL (ref 0.44–1.00)
GFR calc Af Amer: >60 mL/min (ref >60)
GFR calc non Af Amer: >60 mL/min (ref >60)
Glucose, Bld: 150 mg/dL — ABNORMAL HIGH (ref 65–99)
Potassium: 3.8 mmol/L (ref 3.5–5.1)
Sodium: 139 mmol/L (ref 135–145)
Total Bilirubin: 0.4 mg/dL (ref 0.3–1.2)
Total Protein: 6.6 g/dL (ref 6.5–8.1)

## 2017-07-15 LAB — CBC WITH DIFFERENTIAL/PLATELET
Basophils Absolute: 0 10*3/uL (ref 0–0.1)
Basophils Relative: 0 %
Eosinophils Absolute: 0.1 10*3/uL (ref 0–0.7)
Eosinophils Relative: 2 %
HCT: 37.3 % (ref 35.0–47.0)
Hemoglobin: 12.9 g/dL (ref 12.0–16.0)
Lymphocytes Relative: 15 %
Lymphs Abs: 0.7 10*3/uL — ABNORMAL LOW (ref 1.0–3.6)
MCH: 33.7 pg (ref 26.0–34.0)
MCHC: 34.5 g/dL (ref 32.0–36.0)
MCV: 97.6 fL (ref 80.0–100.0)
Monocytes Absolute: 0.3 10*3/uL (ref 0.2–0.9)
Monocytes Relative: 6 %
Neutro Abs: 3.6 10*3/uL (ref 1.4–6.5)
Neutrophils Relative %: 77 %
Platelets: 196 10*3/uL (ref 150–440)
RBC: 3.82 MIL/uL (ref 3.80–5.20)
RDW: 16 % — ABNORMAL HIGH (ref 11.5–14.5)
WBC: 4.7 10*3/uL (ref 3.6–11.0)

## 2017-07-15 NOTE — Progress Notes (Signed)
Here for follow up. Feeling better -see follow up area documentation re BM ,and rectal pain

## 2017-07-15 NOTE — Telephone Encounter (Signed)
Patient came to clinic for lab/MD visit.  Patient left the room before wrap up was done.  Per MD patient is cleared to start radiation.  Lynnae Sandhoff, RN in radiation to inform her.  She states they can work patient in today.  Called patient on her cell phone but was unable to get her.  Called Kim back to inform her patient had left and was unreachable.  She states patient can come tomorrow.  Patient will also need to start Xeloda the same day she starts back on radiation.  If I do not hear back from patient, will call later to give specific instructions for tomorrow.

## 2017-07-15 NOTE — Progress Notes (Signed)
[facility] day:  07/15/2017   Chief Complaint: Tamara Hall is a 61 y.o. female with stage IV rectal cancer who is seen for reassessment after holding Xeloda for grade III diarrhea.  HPI:  The patient was last seen in the medical oncology clinic on 07/11/2017.  At that time, her diarrhea had markedly improved.  She was stooling 4 times a day.  Radiation was on hold.  She was on a [employer].  She was taking Lomotil.  During the interim, patient has been doing "much better". Patient's diarrhea has improved. She had 2 loose stools yesterday and used 1 Lomotil tablet. Patient is scheduled to resume radiation tomorrow.  Patient denies any bleeding.  She denies B symptoms.  She is eating well.  She has gained 2 pounds.    Past Medical History:  Diagnosis Date  . Cancer ([place])   . Hypertension   . Pneumonia    20+ years ago    Past Surgical History:  Procedure Laterality Date  . ABDOMINAL HYSTERECTOMY    . BREAST SURGERY    . COLONOSCOPY WITH PROPOFOL N/A 01/14/2017   Procedure: COLONOSCOPY WITH PROPOFOL;  Surgeon: Lucilla Lame, MD;  Location: [facility] ENDOSCOPY;  Service: Endoscopy;  Laterality: N/A;  . ELBOW SURGERY Right    Has had 4 surgeries on right elbow.  Marland Kitchen HAND SURGERY Right 2008  . JOINT REPLACEMENT    . PORTA CATH INSERTION N/A 01/29/2017   Procedure: Glori Luis Cath Insertion;  Surgeon: Algernon Huxley, MD;  Location: [facility];  Service: Cardiovascular;  Laterality: N/A;  . TONSILLECTOMY      Family History  Problem Relation Age of Onset  . Lung cancer Mother   . Lung cancer Father   . Brain cancer Maternal Grandmother     Social History:  reports that she has been smoking Cigarettes.  She has a 80.00 pack-year smoking history. She has never used smokeless tobacco. She reports that she does not drink alcohol or use drugs.  She lives in [place].  She has 3 children (ages 62, 18, and 30).  She smokes 2 packs a day.  She started smoking  at age 89.  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  Tamara Hall is her significant other.  She is alone today.  Allergies: No Known Allergies  Current Medications: Current Outpatient Prescriptions  Medication Sig Dispense Refill  . calcium-vitamin D (OSCAL WITH D) 500-200 MG-UNIT tablet Take 1 tablet by mouth daily with breakfast.    . diphenoxylate-atropine (LOMOTIL) 2.5-0.025 MG tablet Take 1 tablet by mouth 4 (four) times daily as needed for diarrhea or loose stools. 30 tablet 0  . Hydrocodone-Acetaminophen 7.5-300 MG TABS Take 1 tablet by mouth every 6 (six) hours as needed (pain). 30 each 0  . omeprazole (PRILOSEC) 20 MG capsule Take 1 capsule (20 mg total) by mouth daily. 30 capsule 0  . ondansetron (ZOFRAN) 8 MG tablet Take 1 tablet (8 mg total) by mouth every 8 (eight) hours as needed. 30 tablet 2  . potassium chloride SA (K-DUR,KLOR-CON) 20 MEQ tablet Take 1 tablet (20 mEq total) by mouth daily. (Patient taking differently: Take 20 mEq by mouth daily. ) 20 tablet 0  . acetaminophen (TYLENOL) 325 MG tablet Take 2 tablets (650 mg total) by mouth every 6 (six) hours as needed for mild pain (or Fever >/= 101). (Patient not taking:  Reported on 06/30/2017)    . albuterol (PROVENTIL HFA;VENTOLIN HFA) 108 (90 Base) MCG/ACT inhaler Inhale 2 puffs into the lungs every 6 (six) hours as needed for wheezing or shortness of breath. (Patient not taking: Reported on 07/15/2017) 1 Inhaler 2  . busPIRone (BUSPAR) 7.5 MG tablet Take 7.5 mg by mouth.    . capecitabine (XELODA) 150 MG tablet Take one 150 mg pill twice a day for 5 days each week during radiation with three 500 mg tablets (Patient not taking: Reported on 07/15/2017) 50 tablet 0  . capecitabine (XELODA) 500 MG tablet Take three  500 mg pills twice a day for 5 days each week during radiation with one 150 mg tablet (Patient not taking: Reported on 07/15/2017) 150 tablet 0  . lidocaine-prilocaine  (EMLA) cream APPLY A SMALL AMOUNT TO PORT SITE AT LEAST 1 HOUR BEFORE BEING ACCESSED. COVER IN PLASTIC WRAP. (Patient not taking: Reported on 06/30/2017) 30 g 1  . LORazepam (ATIVAN) 0.5 MG tablet Take 1 tablet (0.5 mg total) by mouth every 6 (six) hours as needed. For nausea or vomiting (Patient not taking: Reported on 06/30/2017) 30 tablet 1  . Magnesium Hydroxide (MILK OF MAGNESIA PO) Take 30 mLs by mouth at bedtime.      No current facility-administered medications for this visit.     Review of Systems:  GENERAL:  Feels "better".  No fevers or sweats.  Weight gain of 2 pounds. PERFORMANCE STATUS (ECOG):  1 HEENT:  No visual changes, runny nose, sore throat, mouth sores or tenderness. Lungs:  No shortness of breath. No cough.  No hemoptysis. Cardiac:  No chest pain, palpitations, orthopnea, or PND.  Elevated BP due to anxiety. GI:  Diarrhea, improved (see HPI).  No nausea or vomiting.  No constipation or melena.  Minimal rectal bleeding. GU:  No urgency, frequency, dysuria, or hematuria.  External irritation secondary to radiation. Musculoskeletal:  No back pain.  No joint pain.  No muscle tenderness. Extremities:  Resolution of cramps.  No pain or swelling. Skin:  No rashes or skin changes. Neuro:  No headache, numbness or weakness, balance or coordination issues. Endocrine:  No diabetes, thyroid issues, hot flashes or night sweats. Psych:  Stress/anxiety.  No depression. Pain: No pain. Review of systems:  All other systems reviewed and found to be negative.  Physical Exam: Blood pressure (!) 151/72, pulse 71, temperature (!) 95.9 F (35.5 C), temperature source Tympanic, resp. rate 18, weight 186 lb 9.6 oz (84.6 kg). GENERAL:  Well developed, well nourished, woman sitting comfortably in the oncology clinic in no acute distress.   MENTAL STATUS:  Alert and oriented to person, place and time. HEAD:Wearing a black scarf.  Blonde graying hair. Normocephalic, atraumatic, face symmetric,  no Cushingoid features. EYES:Blueeyes. Pupils equal round and reactive to light and accomodation. No conjunctivitis or scleral icterus. QQP:YPPJKDTOIZ clear without lesion. Tonguenormal. Mucous membranes moist. RESPIRATORY:Clear to ausculation without rales or rhonchi. CARDIOVASCULAR:Regular rate andrhythmwithout murmur, rub or gallop. ABDOMEN: Soft, non-tender with active bowel soundsand no hepatosplenomegaly. No masses. SKIN: No palmar changes.  No rashes, ulcers or lesions. EXTREMITIES: No edema, no skin discoloration or tenderness. No palpable cords. LYMPHNODES: No palpable cervical, supraclavicular, axillary or inguinal adenopathy  NEUROLOGICAL: Unremarkable. PSYCH: Appropriate.   Appointment on 07/15/2017  Component Date Value Ref Range Status  . WBC 07/15/2017 4.7  3.6 - 11.0 K/uL Final  . RBC 07/15/2017 3.82  3.80 - 5.20 MIL/uL Final  . Hemoglobin 07/15/2017 12.9  12.0 - 16.0 g/dL  Final  . HCT 07/15/2017 37.3  35.0 - 47.0 % Final  . MCV 07/15/2017 97.6  80.0 - 100.0 fL Final  . MCH 07/15/2017 33.7  26.0 - 34.0 pg Final  . MCHC 07/15/2017 34.5  32.0 - 36.0 g/dL Final  . RDW 07/15/2017 16.0* 11.5 - 14.5 % Final  . Platelets 07/15/2017 196  150 - 440 K/uL Final  . Neutrophils Relative % 07/15/2017 77  % Final  . Neutro Abs 07/15/2017 3.6  1.4 - 6.5 K/uL Final  . Lymphocytes Relative 07/15/2017 15  % Final  . Lymphs Abs 07/15/2017 0.7* 1.0 - 3.6 K/uL Final  . Monocytes Relative 07/15/2017 6  % Final  . Monocytes Absolute 07/15/2017 0.3  0.2 - 0.9 K/uL Final  . Eosinophils Relative 07/15/2017 2  % Final  . Eosinophils Absolute 07/15/2017 0.1  0 - 0.7 K/uL Final  . Basophils Relative 07/15/2017 0  % Final  . Basophils Absolute 07/15/2017 0.0  0 - 0.1 K/uL Final  . Sodium 07/15/2017 139  135 - 145 mmol/L Final  . Potassium 07/15/2017 3.8  3.5 - 5.1 mmol/L Final  . Chloride 07/15/2017 104  101 - 111 mmol/L Final  . CO2 07/15/2017 28  22 - 32 mmol/L Final   . Glucose, Bld 07/15/2017 150* 65 - 99 mg/dL Final  . BUN 07/15/2017 9  6 - 20 mg/dL Final  . Creatinine, Ser 07/15/2017 0.71  0.44 - 1.00 mg/dL Final  . Calcium 07/15/2017 9.1  8.9 - 10.3 mg/dL Final  . Total Protein 07/15/2017 6.6  6.5 - 8.1 g/dL Final  . Albumin 07/15/2017 3.8  3.5 - 5.0 g/dL Final  . AST 07/15/2017 26  15 - 41 U/L Final  . ALT 07/15/2017 17  14 - 54 U/L Final  . Alkaline Phosphatase 07/15/2017 85  38 - 126 U/L Final  . Total Bilirubin 07/15/2017 0.4  0.3 - 1.2 mg/dL Final  . GFR calc non Af Amer 07/15/2017 >60  >60 mL/min Final  . GFR calc Af Amer 07/15/2017 >60  >60 mL/min Final   Comment: (NOTE) The eGFR has been calculated using the CKD EPI equation. This calculation has not been validated in all clinical situations. eGFR's persistently <60 mL/min signify possible Chronic Kidney Disease.   . Anion gap 07/15/2017 7  5 - 15 Final    Assessment:  RUSSIE GULLEDGE is a 61 y.o. female with clinical stage T3N1M1 rectal cancer.   She presented with a 9 month history of rectal bleeding.  Colonoscopy on 01/14/2017 revealed a frond-like/villous non-obstructing large mass in the rectum. The mass was non-circumferential.  Biopsies revealed high grade dysplasia within an adenoma (? sampling error).  CEA was 31.6 on 01/15/2017.  Abdomen and pelvic CT on 01/13/2017 revealed irregular thickening of the rectum suspicious for carcinoma.  There was a 1.6 cm hypoattenuating lesion in the right hepatic lobe worrisome for metastatic disease.  Chest CT on 01/15/2017 revealed a 9 mm noncalcified left lower lobe pulmonary nodule (new).  Pelvic MRI on 01/16/2017 revealed rectal adenocarcinoma T3N1.  There was extension beyond the muscularis propria (17 mm).  There were mesorectal nodes >= 5 mm.  There was adenopathy on the left side of the mesorectum, including a 9 mm lesion.  The distance from the tumor to the anal sphincter was 8 mm.  PET scan on 01/27/2017 revealed intense metabolic  activity associated rectal mass consists with primary rectal carcinoma.  There was a hypermetabolic metastatic lesion to the central LEFT  hepatic lobe.  There was metabolic activity associated with small LEFT lower lobe nodule which was most consistent with pulmonary metastasis.  Liver MRI on 01/31/2017 revealed a 2.6 x 2.7 x 2.1 cm lesion in segment VIII of the liver near the junction with segment IVa c/w a metastatic lesion.  There was a 1.1 x 0.8 cm suspected metastatic lesion peripherally in segment VI of the liver.  CT [facility] on 02/07/2017 revealed malignant cells c/w metastatic colorectal adenocarcinoma.  Tumor was sent for  MMR/MSI, KRAS/NRAS, and BRAF.  She is scheduled to have liver directed therapy (ablation) at [facility] on 03/19/2017.  CEA has been followed: 31.6 on 01/15/2017, 38.1 on 02/18/2017, 25.1 on 03/18/2017, 11.8 on 04/08/2017, 9.2 on 04/22/2017, 9.5 on 05/20/2017, and 9.6 on 06/19/2017.  She is s/p 2 cycle of FOLFOX  (02/18/2017 - 03/04/2017).  She developed transient cold induced neuropathy.  She developed wheezing and hypoxia with cycle #2 felt secondary to oxaliplatin.  She was treated with oxygen by face mask, Benadryl, hydrocortisone, and nebulized albuterol.    She is s/p 4 cycles of FOLFIRI (03/24/2017 - 04/22/2017) with Neulasta support.  Diarrhea was controlled with Imodium.  Abdomen and pelvic CT on 05/16/2017 revealed persistent but decreased asymmetric wall thickening in the rectum.  The medial segment left liver lesion was smaller (2.7 x 2.6 cm to 1.9 x 1.6 cm). A tiny lesion seen in segment VI of the liver on previous MRI was not evident on today's CT scan.  The left lower lobe pulmonary nodule was smaller (9 mm to 6 mm).  There was no new or progressive findings in the abdomen and pelvis.  She underwent CT guided microwave ablation of the liver lesion on 06/06/2017.    She began radiation on 06/18/2017.  She receives daily Xeloda with radiation. She  missed treatment on 06/23/2017 and 06/26/2017.  She developed grade III diarrhea.  Radiation has been on hold since 07/07/2017.  She is on Lomotil.  She has an 80 pack year smoking history.  She is smoking.  She was treated for bronchitis and reactive airway disease on 06/30/2017.  She completed azithromycin and a prednisone pulse.  Symptomatically, she is feeling better. Her diarrhea has improved.  She had 2 soft stools yesterday.  Exam is stable. Potassium is 3.8  Plan: 1.  Labs today: CBC with diff, CMP. 2.  Follow up with radiation oncology as scheduled on 07/16/2017. 3.  Discuss reinstitution of radiation and Xeloda at 25% dose reduction (1650 mg to 1150 mg) as diarrhea a grade 0 or 1 (< 4 stools above baseline). 4.  RTC on weekly on Tuesdays as scheduled.   Honor Loh, NP  07/15/2017, 10:07 AM   I saw and evaluated the patient, participating in the key portions of the service and reviewing pertinent diagnostic studies and records.  I reviewed the nurse practitioner's note and agree with the findings and the plan.  The assessment and plan were discussed with the patient.  A few questions were asked by the patient and answered.   Lequita Asal, MD 07/15/2017, 10:07 AM

## 2017-07-16 ENCOUNTER — Ambulatory Visit
Admission: RE | Admit: 2017-07-16 | Discharge: 2017-07-16 | Disposition: A | Discharge status: Home / Self Care | Payer: Managed Care, Other (non HMO) | Source: Ambulatory Visit | Attending: Radiation Oncology | Admitting: Radiation Oncology

## 2017-07-16 ENCOUNTER — Ambulatory Visit: Payer: Managed Care, Other (non HMO)

## 2017-07-16 DIAGNOSIS — C20 Malignant neoplasm of rectum: Secondary | ICD-10-CM | POA: Diagnosis not present

## 2017-07-17 ENCOUNTER — Inpatient Hospital Stay: Payer: Managed Care, Other (non HMO)

## 2017-07-17 ENCOUNTER — Other Ambulatory Visit: Payer: Self-pay | Admitting: *Deleted

## 2017-07-17 ENCOUNTER — Inpatient Hospital Stay: Payer: Managed Care, Other (non HMO) | Admitting: Hematology and Oncology

## 2017-07-17 ENCOUNTER — Ambulatory Visit
Admission: RE | Admit: 2017-07-17 | Discharge: 2017-07-17 | Disposition: A | Discharge status: Home / Self Care | Payer: Managed Care, Other (non HMO) | Source: Ambulatory Visit | Attending: Radiation Oncology | Admitting: Radiation Oncology

## 2017-07-17 DIAGNOSIS — C20 Malignant neoplasm of rectum: Secondary | ICD-10-CM

## 2017-07-17 DIAGNOSIS — C787 Secondary malignant neoplasm of liver and intrahepatic bile duct: Secondary | ICD-10-CM

## 2017-07-17 MED ORDER — HYDROCODONE-ACETAMINOPHEN 7.5-300 MG PO TABS: 1.0000 | ORAL_TABLET | Freq: Four times a day (QID) | ORAL | 0 refills | Status: DC | PRN

## 2017-07-17 NOTE — Telephone Encounter (Signed)
  Hold Xeloda and radiation.  Use Lomotil prn.  Reassess next week.  M

## 2017-07-17 NOTE — Telephone Encounter (Signed)
Xeloda dose checked per request of Dr Mike Gip, patient reports that she is taking the reduced dose and that she is taking radiation therapy. Please advise if she can take Lomotil

## 2017-07-17 NOTE — Telephone Encounter (Signed)
Patient informed of doctor response and radiation therapy informed as well. Patient repeated back to me

## 2017-07-17 NOTE — Telephone Encounter (Signed)
Patient reports that she was instructed to stop her Lomotil for diarrhea, She has not taken any for several days and she has had 3 watery stools this morning already. Asking if she can restart Lomotil again before it gets out of hand again. She is also requesting refill of her Norco on Friday

## 2017-07-18 ENCOUNTER — Ambulatory Visit: Payer: Managed Care, Other (non HMO)

## 2017-07-19 ENCOUNTER — Other Ambulatory Visit: Payer: Self-pay | Admitting: Urgent Care

## 2017-07-21 ENCOUNTER — Ambulatory Visit: Payer: Managed Care, Other (non HMO)

## 2017-07-22 ENCOUNTER — Inpatient Hospital Stay (HOSPITAL_BASED_OUTPATIENT_CLINIC_OR_DEPARTMENT_OTHER): Payer: Managed Care, Other (non HMO) | Admitting: Hematology and Oncology

## 2017-07-22 ENCOUNTER — Ambulatory Visit: Payer: Managed Care, Other (non HMO)

## 2017-07-22 ENCOUNTER — Other Ambulatory Visit: Payer: Self-pay

## 2017-07-22 ENCOUNTER — Inpatient Hospital Stay: Payer: Managed Care, Other (non HMO)

## 2017-07-22 ENCOUNTER — Encounter: Payer: Self-pay | Admitting: Hematology and Oncology

## 2017-07-22 VITALS — BP 157/78 | HR 84 | Temp 95.4°F | Resp 18 | Wt 186.3 lb

## 2017-07-22 DIAGNOSIS — Z803 Family history of malignant neoplasm of breast: Secondary | ICD-10-CM

## 2017-07-22 DIAGNOSIS — I10 Essential (primary) hypertension: Secondary | ICD-10-CM

## 2017-07-22 DIAGNOSIS — Z8701 Personal history of pneumonia (recurrent): Secondary | ICD-10-CM

## 2017-07-22 DIAGNOSIS — R531 Weakness: Secondary | ICD-10-CM

## 2017-07-22 DIAGNOSIS — R197 Diarrhea, unspecified: Secondary | ICD-10-CM

## 2017-07-22 DIAGNOSIS — F17210 Nicotine dependence, cigarettes, uncomplicated: Secondary | ICD-10-CM

## 2017-07-22 DIAGNOSIS — C787 Secondary malignant neoplasm of liver and intrahepatic bile duct: Secondary | ICD-10-CM

## 2017-07-22 DIAGNOSIS — Z79899 Other long term (current) drug therapy: Secondary | ICD-10-CM

## 2017-07-22 DIAGNOSIS — C20 Malignant neoplasm of rectum: Secondary | ICD-10-CM

## 2017-07-22 DIAGNOSIS — C7802 Secondary malignant neoplasm of left lung: Secondary | ICD-10-CM | POA: Diagnosis not present

## 2017-07-22 DIAGNOSIS — T451X5A Adverse effect of antineoplastic and immunosuppressive drugs, initial encounter: Secondary | ICD-10-CM

## 2017-07-22 DIAGNOSIS — Z7189 Other specified counseling: Secondary | ICD-10-CM

## 2017-07-22 DIAGNOSIS — E876 Hypokalemia: Secondary | ICD-10-CM

## 2017-07-22 DIAGNOSIS — Z923 Personal history of irradiation: Secondary | ICD-10-CM | POA: Diagnosis not present

## 2017-07-22 DIAGNOSIS — Z801 Family history of malignant neoplasm of trachea, bronchus and lung: Secondary | ICD-10-CM

## 2017-07-22 DIAGNOSIS — K219 Gastro-esophageal reflux disease without esophagitis: Secondary | ICD-10-CM

## 2017-07-22 DIAGNOSIS — Z9221 Personal history of antineoplastic chemotherapy: Secondary | ICD-10-CM

## 2017-07-22 DIAGNOSIS — K521 Toxic gastroenteritis and colitis: Secondary | ICD-10-CM

## 2017-07-22 LAB — CBC WITH DIFFERENTIAL/PLATELET
Basophils Absolute: 0 10*3/uL (ref 0–0.1)
Basophils Relative: 0 %
Eosinophils Absolute: 0.1 10*3/uL (ref 0–0.7)
Eosinophils Relative: 3 %
HCT: 36.4 % (ref 35.0–47.0)
Hemoglobin: 12.8 g/dL (ref 12.0–16.0)
Lymphocytes Relative: 16 %
Lymphs Abs: 0.6 10*3/uL — ABNORMAL LOW (ref 1.0–3.6)
MCH: 33.9 pg (ref 26.0–34.0)
MCHC: 35.1 g/dL (ref 32.0–36.0)
MCV: 96.7 fL (ref 80.0–100.0)
Monocytes Absolute: 0.3 10*3/uL (ref 0.2–0.9)
Monocytes Relative: 8 %
Neutro Abs: 2.8 10*3/uL (ref 1.4–6.5)
Neutrophils Relative %: 73 %
Platelets: 216 10*3/uL (ref 150–440)
RBC: 3.76 MIL/uL — ABNORMAL LOW (ref 3.80–5.20)
RDW: 16.3 % — ABNORMAL HIGH (ref 11.5–14.5)
WBC: 3.9 10*3/uL (ref 3.6–11.0)

## 2017-07-22 LAB — COMPREHENSIVE METABOLIC PANEL
ALT: 16 U/L (ref 14–54)
AST: 21 U/L (ref 15–41)
Albumin: 3.9 g/dL (ref 3.5–5.0)
Alkaline Phosphatase: 87 U/L (ref 38–126)
Anion gap: 6 (ref 5–15)
BUN: 9 mg/dL (ref 6–20)
CO2: 25 mmol/L (ref 22–32)
Calcium: 8.9 mg/dL (ref 8.9–10.3)
Chloride: 105 mmol/L (ref 101–111)
Creatinine, Ser: 0.73 mg/dL (ref 0.44–1.00)
GFR calc Af Amer: >60 mL/min (ref >60)
GFR calc non Af Amer: >60 mL/min (ref >60)
Glucose, Bld: 130 mg/dL — ABNORMAL HIGH (ref 65–99)
Potassium: 3.5 mmol/L (ref 3.5–5.1)
Sodium: 136 mmol/L (ref 135–145)
Total Bilirubin: 0.4 mg/dL (ref 0.3–1.2)
Total Protein: 6.7 g/dL (ref 6.5–8.1)

## 2017-07-22 MED ORDER — DIPHENOXYLATE-ATROPINE 2.5-0.025 MG PO TABS: 1.0000 | ORAL_TABLET | Freq: Four times a day (QID) | ORAL | 0 refills | Status: DC | PRN

## 2017-07-22 MED ORDER — HYDROCODONE-ACETAMINOPHEN 7.5-300 MG PO TABS: 1.0000 | ORAL_TABLET | Freq: Four times a day (QID) | ORAL | 0 refills | Status: DC | PRN

## 2017-07-22 NOTE — Progress Notes (Signed)
[facility] day:  07/22/2017   Chief Complaint: Tamara Hall is a 61 y.o. female with stage IV rectal cancer who is seen for weekly assessment on Xeloda and radiation.  HPI:  The patient was last seen in the medical oncology clinic on 07/15/2017.  At that time, she was feeling better. Her diarrhea had improved.  She had 2 soft stools the day prior.  Exam was stable. Potassium was 3.8.    She received radiation from 07/15/2017 - 07/17/2017.  Xeloda was decreased to 1150 mg BID.  She contacted the clinic on 07/18/2017 regarding increased diarrhea.  Treatment was held.  Patient upset citing that she "wants to get all of this over with". Patient notes that she has continued diarrhea. She is using Lomotil x 4 times a day. Diarrhea was initially watery.  With Lomotil, stool quality has improved.  She describes 2 "soft serve" stools yesterday.  Patient only able to have 3 of her scheduled radiation treatments last week.  Patient otherwise is doing "ok". She is weak from "all of the diarrhea". Patient denies any hematochezia or melena. She is eating and drinking well, with no demonstrated weight loss.    Past Medical History:  Diagnosis Date  . Cancer ([place])   . Hypertension   . Pneumonia    20+ years ago    Past Surgical History:  Procedure Laterality Date  . ABDOMINAL HYSTERECTOMY    . BREAST SURGERY    . COLONOSCOPY WITH PROPOFOL N/A 01/14/2017   Procedure: COLONOSCOPY WITH PROPOFOL;  Surgeon: Lucilla Lame, MD;  Location: [facility] ENDOSCOPY;  Service: Endoscopy;  Laterality: N/A;  . ELBOW SURGERY Right    Has had 4 surgeries on right elbow.  Marland Kitchen HAND SURGERY Right 2008  . JOINT REPLACEMENT    . PORTA CATH INSERTION N/A 01/29/2017   Procedure: Glori Luis Cath Insertion;  Surgeon: Algernon Huxley, MD;  Location: [facility];  Service: Cardiovascular;  Laterality: N/A;  . TONSILLECTOMY      Family History  Problem Relation Age of Onset  . Lung cancer  Mother   . Lung cancer Father   . Brain cancer Maternal Grandmother     Social History:  reports that she has been smoking Cigarettes.  She has a 80.00 pack-year smoking history. She has never used smokeless tobacco. She reports that she does not drink alcohol or use drugs.  She is smoking 1/2 packs/day.  She lives in [place].  She has 3 children (ages 51, 46, and 54).  She smokes 2 packs a day.  She started smoking at age 80.  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  [name] is her significant other.  She is alone today.  Allergies: No Known Allergies  Current Medications: Current Outpatient Prescriptions  Medication Sig Dispense Refill  . calcium-vitamin D (OSCAL WITH D) 500-200 MG-UNIT tablet Take 1 tablet by mouth daily with breakfast.    . capecitabine (XELODA) 150 MG tablet Take one 150 mg pill twice a day for 5 days each week during radiation with three 500 mg tablets 50 tablet 0  . capecitabine (XELODA) 500 MG tablet Take three  500 mg pills twice a day for 5 days each week during radiation with one 150 mg tablet 150 tablet 0  . diphenoxylate-atropine (LOMOTIL) 2.5-0.025 MG tablet Take 1 tablet by mouth 4 (four) times daily as needed for diarrhea  or loose stools. 30 tablet 0  . omeprazole (PRILOSEC) 20 MG capsule TAKE 1 CAPSULE BY MOUTH EVERY DAY 30 capsule 3  . ondansetron (ZOFRAN) 8 MG tablet Take 1 tablet (8 mg total) by mouth every 8 (eight) hours as needed. 30 tablet 2  . acetaminophen (TYLENOL) 325 MG tablet Take 2 tablets (650 mg total) by mouth every 6 (six) hours as needed for mild pain (or Fever >/= 101). (Patient not taking: Reported on 06/30/2017)    . albuterol (PROVENTIL HFA;VENTOLIN HFA) 108 (90 Base) MCG/ACT inhaler Inhale 2 puffs into the lungs every 6 (six) hours as needed for wheezing or shortness of breath. (Patient not taking: Reported on 07/15/2017) 1 Inhaler 2  . busPIRone (BUSPAR) 7.5 MG tablet Take 7.5 mg by  mouth.    . Hydrocodone-Acetaminophen 7.5-300 MG TABS Take 1 tablet by mouth every 6 (six) hours as needed (pain). (Patient not taking: Reported on 07/22/2017) 30 each 0  . lidocaine-prilocaine (EMLA) cream APPLY A SMALL AMOUNT TO PORT SITE AT LEAST 1 HOUR BEFORE BEING ACCESSED. COVER IN PLASTIC WRAP. (Patient not taking: Reported on 06/30/2017) 30 g 1  . LORazepam (ATIVAN) 0.5 MG tablet Take 1 tablet (0.5 mg total) by mouth every 6 (six) hours as needed. For nausea or vomiting (Patient not taking: Reported on 06/30/2017) 30 tablet 1  . Magnesium Hydroxide (MILK OF MAGNESIA PO) Take 30 mLs by mouth at bedtime.     . potassium chloride SA (K-DUR,KLOR-CON) 20 MEQ tablet Take 1 tablet (20 mEq total) by mouth daily. (Patient not taking: Reported on 07/22/2017) 20 tablet 0   No current facility-administered medications for this visit.     Review of Systems:  GENERAL:  Feels "weak".  No fevers or sweats.  Weight stable. PERFORMANCE STATUS (ECOG):  1 HEENT:  No visual changes, runny nose, sore throat, mouth sores or tenderness. Lungs:  No shortness of breath. No cough.  No hemoptysis. Cardiac:  No chest pain, palpitations, orthopnea, or PND.  Elevated BP due to anxiety. GI:  Diarrhea (see HPI).  No nausea or vomiting.  No constipation or melena.  Minimal rectal bleeding. GU:  No urgency, frequency, dysuria, or hematuria.  External irritation secondary to radiation. Musculoskeletal:  No back pain.  No joint pain.  No muscle tenderness. Extremities:  No pain or swelling. Skin:  No rashes or skin changes. Neuro:  No headache, numbness or weakness, balance or coordination issues. Endocrine:  No diabetes, thyroid issues, hot flashes or night sweats. Psych:  Stress/anxiety.  No depression. Pain: No pain. Review of systems:  All other systems reviewed and found to be negative.  Physical Exam: Blood pressure (!) 157/78, pulse 84, temperature (!) 95.4 F (35.2 C), temperature source Tympanic, resp. rate  18, weight 186 lb 4.8 oz (84.5 kg). GENERAL:  Well developed, well nourished, woman sitting comfortably in the oncology clinic in no acute distress.  She smells of smoke. MENTAL STATUS:  Alert and oriented to person, place and time. HEAD:Short blonde graying hair. Normocephalic, atraumatic, face symmetric, no Cushingoid features. EYES:Blueeyes. Pupils equal round and reactive to light and accomodation. No conjunctivitis or scleral icterus. YHC:WCBJSEGBTD clear without lesion. Tonguenormal. Mucous membranes moist. RESPIRATORY:Clear to ausculation without rales or rhonchi. CARDIOVASCULAR:Regular rate andrhythmwithout murmur, rub or gallop. ABDOMEN: Soft, non-tender with active bowel soundsand no hepatosplenomegaly. No masses. SKIN: No palmar changes.  No rashes, ulcers or lesions. EXTREMITIES: No edema, no skin discoloration or tenderness. No palpable cords. LYMPHNODES: No palpable cervical, supraclavicular, axillary or inguinal  adenopathy  NEUROLOGICAL: Unremarkable. PSYCH: Appropriate.   Appointment on 07/22/2017  Component Date Value Ref Range Status  . WBC 07/22/2017 3.9  3.6 - 11.0 K/uL Final  . RBC 07/22/2017 3.76* 3.80 - 5.20 MIL/uL Final  . Hemoglobin 07/22/2017 12.8  12.0 - 16.0 g/dL Final  . HCT 07/22/2017 36.4  35.0 - 47.0 % Final  . MCV 07/22/2017 96.7  80.0 - 100.0 fL Final  . MCH 07/22/2017 33.9  26.0 - 34.0 pg Final  . MCHC 07/22/2017 35.1  32.0 - 36.0 g/dL Final  . RDW 07/22/2017 16.3* 11.5 - 14.5 % Final  . Platelets 07/22/2017 216  150 - 440 K/uL Final  . Neutrophils Relative % 07/22/2017 73  % Final  . Neutro Abs 07/22/2017 2.8  1.4 - 6.5 K/uL Final  . Lymphocytes Relative 07/22/2017 16  % Final  . Lymphs Abs 07/22/2017 0.6* 1.0 - 3.6 K/uL Final  . Monocytes Relative 07/22/2017 8  % Final  . Monocytes Absolute 07/22/2017 0.3  0.2 - 0.9 K/uL Final  . Eosinophils Relative 07/22/2017 3  % Final  . Eosinophils Absolute 07/22/2017 0.1  0 - 0.7  K/uL Final  . Basophils Relative 07/22/2017 0  % Final  . Basophils Absolute 07/22/2017 0.0  0 - 0.1 K/uL Final  . Sodium 07/22/2017 136  135 - 145 mmol/L Final  . Potassium 07/22/2017 3.5  3.5 - 5.1 mmol/L Final  . Chloride 07/22/2017 105  101 - 111 mmol/L Final  . CO2 07/22/2017 25  22 - 32 mmol/L Final  . Glucose, Bld 07/22/2017 130* 65 - 99 mg/dL Final  . BUN 07/22/2017 9  6 - 20 mg/dL Final  . Creatinine, Ser 07/22/2017 0.73  0.44 - 1.00 mg/dL Final  . Calcium 07/22/2017 8.9  8.9 - 10.3 mg/dL Final  . Total Protein 07/22/2017 6.7  6.5 - 8.1 g/dL Final  . Albumin 07/22/2017 3.9  3.5 - 5.0 g/dL Final  . AST 07/22/2017 21  15 - 41 U/L Final  . ALT 07/22/2017 16  14 - 54 U/L Final  . Alkaline Phosphatase 07/22/2017 87  38 - 126 U/L Final  . Total Bilirubin 07/22/2017 0.4  0.3 - 1.2 mg/dL Final  . GFR calc non Af Amer 07/22/2017 >60  >60 mL/min Final  . GFR calc Af Amer 07/22/2017 >60  >60 mL/min Final   Comment: (NOTE) The eGFR has been calculated using the CKD EPI equation. This calculation has not been validated in all clinical situations. eGFR's persistently <60 mL/min signify possible Chronic Kidney Disease.   . Anion gap 07/22/2017 6  5 - 15 Final    Assessment:  Tamara Hall is a 61 y.o. female with clinical stage T3N1M1 rectal cancer.   She presented with a 9 month history of rectal bleeding.  Colonoscopy on 01/14/2017 revealed a frond-like/villous non-obstructing large mass in the rectum. The mass was non-circumferential.  Biopsies revealed high grade dysplasia within an adenoma (? sampling error).  CEA was 31.6 on 01/15/2017.  Abdomen and pelvic CT on 01/13/2017 revealed irregular thickening of the rectum suspicious for carcinoma.  There was a 1.6 cm hypoattenuating lesion in the right hepatic lobe worrisome for metastatic disease.  Chest CT on 01/15/2017 revealed a 9 mm noncalcified left lower lobe pulmonary nodule (new).  Pelvic MRI on 01/16/2017 revealed rectal  adenocarcinoma T3N1.  There was extension beyond the muscularis propria (17 mm).  There were mesorectal nodes >= 5 mm.  There was adenopathy on the left side  of the mesorectum, including a 9 mm lesion.  The distance from the tumor to the anal sphincter was 8 mm.  PET scan on 01/27/2017 revealed intense metabolic activity associated rectal mass consists with primary rectal carcinoma.  There was a hypermetabolic metastatic lesion to the central LEFT hepatic lobe.  There was metabolic activity associated with small LEFT lower lobe nodule which was most consistent with pulmonary metastasis.  Liver MRI on 01/31/2017 revealed a 2.6 x 2.7 x 2.1 cm lesion in segment VIII of the liver near the junction with segment IVa c/w a metastatic lesion.  There was a 1.1 x 0.8 cm suspected metastatic lesion peripherally in segment VI of the liver.  CT guided liver biopsy at [facility] on 02/07/2017 revealed malignant cells c/w metastatic colorectal adenocarcinoma.  Tumor was sent for  MMR/MSI, KRAS/NRAS, and BRAF.  She is scheduled to have liver directed therapy (ablation) at [facility] on 03/19/2017.  CEA has been followed: 31.6 on 01/15/2017, 38.1 on 02/18/2017, 25.1 on 03/18/2017, 11.8 on 04/08/2017, 9.2 on 04/22/2017, 9.5 on 05/20/2017, and 9.6 on 06/19/2017.  She is s/p 2 cycle of FOLFOX  (02/18/2017 - 03/04/2017).  She developed transient cold induced neuropathy.  She developed wheezing and hypoxia with cycle #2 felt secondary to oxaliplatin.  She was treated with oxygen by face mask, Benadryl, hydrocortisone, and nebulized albuterol.    She is s/p 4 cycles of FOLFIRI (03/24/2017 - 04/22/2017) with Neulasta support.  Diarrhea was controlled with Imodium.  Abdomen and pelvic CT on 05/16/2017 revealed persistent but decreased asymmetric wall thickening in the rectum.  The medial segment left liver lesion was smaller (2.7 x 2.6 cm to 1.9 x 1.6 cm). A tiny lesion seen in segment VI of the liver on previous MRI was not evident on  today's CT scan.  The left lower lobe pulmonary nodule was smaller (9 mm to 6 mm).  There was no new or progressive findings in the abdomen and pelvis.  She underwent CT guided microwave ablation of the liver lesion on 06/06/2017.    She began radiation on 06/18/2017.  She receives daily Xeloda with radiation. She missed treatment on 06/23/2017 and 06/26/2017.  She developed grade III diarrhea.  Radiation has been on hold since 07/07/2017.  She received radiation from 07/15/2017 - 07/17/2017.  Xeloda was decreased to 1150 mg BID.  She is on Lomotil 4 times/day.  She has an 80 pack year smoking history.  She is smoking.  She was treated for bronchitis and reactive airway disease on 06/30/2017.  She completed azithromycin and a prednisone pulse.  Symptomatically, she feels weak. Her diarrhea has returned.  She is using Lomotil at least 4 times a day. Patient able to eat and drink normally. Exam is stable. Potassium is 3.5.  Plan: 1.  Labs today: CBC with diff, CMP. 2.  Stool for GI panel by PCR and C.diff by PCR. 3.  Discuss diarrhea. Patient using Lomotil every 6 hours. Instructed to taper to off and to start using Loperamide prn. Patient encouraged to increase fluid intake to prevent dehydration. 4.  Refill Rx: Lomotil 2.5-0.025mg tabs q4h PRN (disp # 30) 5.  Refill Rx: Norco 7.5/325mg q4h PRN (disp # 60). Previously written Rx destroyed, as patient never picked it up. 6.  Continue potassium and calcium supplementation. 7.  Hold radiation treatments and Xeloda until advised my medical oncology.  8.  RTC as already scheduled for MD and labs.    Honor Loh, NP  07/22/2017, 9:41 AM  I saw and evaluated the patient, participating in the key portions of the service and reviewing pertinent diagnostic studies and records. I reviewed the nurse practitioner's note and agree with the findings and the plan.  The assessment and plan were discussed with the patient.  A few questions were asked by the  patient and answered.   Lequita Asal, MD 07/22/2017, 9:41 AM

## 2017-07-22 NOTE — Progress Notes (Signed)
Here for follow up. SEE follow up note for diarrhea related to radiation. Went to radiation on Tues 10/9  Wed 10/10 and 10/11. Then did not go Fri or Monday 10/15 after speaking w Dr Mike Gip staff. Here for evaluation of sx.

## 2017-07-23 ENCOUNTER — Ambulatory Visit: Payer: Managed Care, Other (non HMO)

## 2017-07-24 ENCOUNTER — Ambulatory Visit: Payer: Managed Care, Other (non HMO)

## 2017-07-24 ENCOUNTER — Ambulatory Visit: Payer: Managed Care, Other (non HMO) | Admitting: Hematology and Oncology

## 2017-07-24 ENCOUNTER — Other Ambulatory Visit: Payer: Managed Care, Other (non HMO)

## 2017-07-25 ENCOUNTER — Ambulatory Visit: Payer: Managed Care, Other (non HMO)

## 2017-07-26 ENCOUNTER — Ambulatory Visit: Payer: Managed Care, Other (non HMO)

## 2017-07-28 ENCOUNTER — Ambulatory Visit: Payer: Managed Care, Other (non HMO)

## 2017-07-29 ENCOUNTER — Inpatient Hospital Stay: Payer: Managed Care, Other (non HMO) | Admitting: Hematology and Oncology

## 2017-07-29 ENCOUNTER — Inpatient Hospital Stay: Payer: Managed Care, Other (non HMO)

## 2017-07-29 ENCOUNTER — Ambulatory Visit
Admission: RE | Admit: 2017-07-29 | Discharge: 2017-07-29 | Disposition: A | Discharge status: Home / Self Care | Payer: Managed Care, Other (non HMO) | Source: Ambulatory Visit | Attending: Radiation Oncology | Admitting: Radiation Oncology

## 2017-07-29 ENCOUNTER — Ambulatory Visit: Payer: Managed Care, Other (non HMO)

## 2017-07-29 VITALS — BP 123/77 | HR 76 | Temp 95.6°F | Resp 18 | Wt 185.6 lb

## 2017-07-29 DIAGNOSIS — Z7189 Other specified counseling: Secondary | ICD-10-CM

## 2017-07-29 DIAGNOSIS — C20 Malignant neoplasm of rectum: Secondary | ICD-10-CM

## 2017-07-29 DIAGNOSIS — C7802 Secondary malignant neoplasm of left lung: Secondary | ICD-10-CM

## 2017-07-29 DIAGNOSIS — G893 Neoplasm related pain (acute) (chronic): Secondary | ICD-10-CM

## 2017-07-29 DIAGNOSIS — C787 Secondary malignant neoplasm of liver and intrahepatic bile duct: Secondary | ICD-10-CM

## 2017-07-29 LAB — COMPREHENSIVE METABOLIC PANEL
ALT: 13 U/L — ABNORMAL LOW (ref 14–54)
AST: 22 U/L (ref 15–41)
Albumin: 4 g/dL (ref 3.5–5.0)
Alkaline Phosphatase: 87 U/L (ref 38–126)
Anion gap: 11 (ref 5–15)
BUN: 13 mg/dL (ref 6–20)
CO2: 24 mmol/L (ref 22–32)
Calcium: 9.3 mg/dL (ref 8.9–10.3)
Chloride: 102 mmol/L (ref 101–111)
Creatinine, Ser: 0.72 mg/dL (ref 0.44–1.00)
GFR calc Af Amer: >60 mL/min (ref >60)
GFR calc non Af Amer: >60 mL/min (ref >60)
Glucose, Bld: 125 mg/dL — ABNORMAL HIGH (ref 65–99)
Potassium: 4.2 mmol/L (ref 3.5–5.1)
Sodium: 137 mmol/L (ref 135–145)
Total Bilirubin: 0.4 mg/dL (ref 0.3–1.2)
Total Protein: 6.8 g/dL (ref 6.5–8.1)

## 2017-07-29 LAB — CBC WITH DIFFERENTIAL/PLATELET
Basophils Absolute: 0 10*3/uL (ref 0–0.1)
Basophils Relative: 0 %
Eosinophils Absolute: 0.1 10*3/uL (ref 0–0.7)
Eosinophils Relative: 2 %
HCT: 38.7 % (ref 35.0–47.0)
Hemoglobin: 13.4 g/dL (ref 12.0–16.0)
Lymphocytes Relative: 16 %
Lymphs Abs: 0.7 10*3/uL — ABNORMAL LOW (ref 1.0–3.6)
MCH: 33.4 pg (ref 26.0–34.0)
MCHC: 34.7 g/dL (ref 32.0–36.0)
MCV: 96.3 fL (ref 80.0–100.0)
Monocytes Absolute: 0.4 10*3/uL (ref 0.2–0.9)
Monocytes Relative: 9 %
Neutro Abs: 3.3 10*3/uL (ref 1.4–6.5)
Neutrophils Relative %: 73 %
Platelets: 220 10*3/uL (ref 150–440)
RBC: 4.02 MIL/uL (ref 3.80–5.20)
RDW: 15.8 % — ABNORMAL HIGH (ref 11.5–14.5)
WBC: 4.4 10*3/uL (ref 3.6–11.0)

## 2017-07-29 NOTE — Progress Notes (Signed)
[facility] day:  07/29/2017   Chief Complaint: Tamara Hall is a 61 y.o. female with stage IV rectal cancer who is seen for weekly assessment during neoadjuvant Xeloda and radiation.  HPI:  The patient was last seen in the medical oncology clinic on 07/22/2017.  At that time, she felt weak. Her diarrhea had returned.  She was using Lomotil at least 4 times a day. Patient able to eat and drink normally. Exam was stable. Potassium was 3.5.  Stool studies were requested.  Radiation and Xeloda were on hold.  During the interim, patient is doing well. Her previously reported diarrhea has markedly improved. She is not using the Lomotil.  Patient is only having to use Loperamide intermittently. She has not had any bowel movements today. Patient denies any bleeding; no hematochezia, melena, or vaginal bleeding. She has no B symptoms. Patient eating well, with no weight loss noted.    Past Medical History:  Diagnosis Date  . Cancer ([place])   . Hypertension   . Pneumonia    20+ years ago    Past Surgical History:  Procedure Laterality Date  . ABDOMINAL HYSTERECTOMY    . BREAST SURGERY    . COLONOSCOPY WITH PROPOFOL N/A 01/14/2017   Procedure: COLONOSCOPY WITH PROPOFOL;  Surgeon: Lucilla Lame, MD;  Location: [facility] ENDOSCOPY;  Service: Endoscopy;  Laterality: N/A;  . ELBOW SURGERY Right    Has had 4 surgeries on right elbow.  Marland Kitchen HAND SURGERY Right 2008  . JOINT REPLACEMENT    . PORTA CATH INSERTION N/A 01/29/2017   Procedure: Glori Luis Cath Insertion;  Surgeon: Algernon Huxley, MD;  Location: [facility];  Service: Cardiovascular;  Laterality: N/A;  . TONSILLECTOMY      Family History  Problem Relation Age of Onset  . Lung cancer Mother   . Lung cancer Father   . Brain cancer Maternal Grandmother     Social History:  reports that she has been smoking Cigarettes.  She has a 80.00 pack-year smoking history. She has never used smokeless tobacco. She  reports that she does not drink alcohol or use drugs.  She is smoking 1/2 packs/day.  She lives in [place].  She has 3 children (ages 30, 29, and 79).  She smokes 2 packs a day.  She started smoking at age 85.  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  [name] is her significant other.  She is alone today.  Allergies: No Known Allergies  Current Medications: Current Outpatient Prescriptions  Medication Sig Dispense Refill  . albuterol (PROVENTIL HFA;VENTOLIN HFA) 108 (90 Base) MCG/ACT inhaler Inhale 2 puffs into the lungs every 6 (six) hours as needed for wheezing or shortness of breath. 1 Inhaler 2  . calcium-vitamin D (OSCAL WITH D) 500-200 MG-UNIT tablet Take 1 tablet by mouth daily with breakfast.    . Hydrocodone-Acetaminophen 7.5-300 MG TABS Take 1 tablet by mouth every 6 (six) hours as needed (pain). 60 each 0  . lidocaine-prilocaine (EMLA) cream APPLY A SMALL AMOUNT TO PORT SITE AT LEAST 1 HOUR BEFORE BEING ACCESSED. COVER IN PLASTIC WRAP. 30 g 1  . omeprazole (PRILOSEC) 20 MG capsule TAKE 1 CAPSULE BY MOUTH EVERY DAY 30 capsule 3  . ondansetron (ZOFRAN) 8 MG tablet Take 1 tablet (8 mg total) by mouth every 8 (eight) hours as needed. 30 tablet 2  . potassium chloride SA (K-DUR,KLOR-CON)  20 MEQ tablet Take 1 tablet (20 mEq total) by mouth daily. 20 tablet 0  . capecitabine (XELODA) 150 MG tablet Take one 150 mg pill twice a day for 5 days each week during radiation with three 500 mg tablets (Patient not taking: Reported on 07/29/2017) 50 tablet 0  . capecitabine (XELODA) 500 MG tablet Take three  500 mg pills twice a day for 5 days each week during radiation with one 150 mg tablet (Patient not taking: Reported on 07/29/2017) 150 tablet 0  . LORazepam (ATIVAN) 0.5 MG tablet Take 1 tablet (0.5 mg total) by mouth every 6 (six) hours as needed. For nausea or vomiting (Patient not taking: Reported on 06/30/2017) 30 tablet 1  . Magnesium  Hydroxide (MILK OF MAGNESIA PO) Take 30 mLs by mouth at bedtime.      No current facility-administered medications for this visit.     Review of Systems:  GENERAL:  Feels "well".  No fevers or sweats.  Weight stable. PERFORMANCE STATUS (ECOG):  1 HEENT:  No visual changes, runny nose, sore throat, mouth sores or tenderness. Lungs:  No shortness of breath. No cough.  No hemoptysis. Cardiac:  No chest pain, palpitations, orthopnea, or PND.  Elevated BP due to anxiety. GI:  Diarrhea (resolved).  No nausea or vomiting.  No constipation or melena.  Minimal rectal bleeding. GU:  No urgency, frequency, dysuria, or hematuria.  External irritation secondary to radiation. Musculoskeletal:  No back pain.  No joint pain.  No muscle tenderness. Extremities:  No pain or swelling. Skin:  No rashes or skin changes. Neuro:  No headache, numbness or weakness, balance or coordination issues. Endocrine:  No diabetes, thyroid issues, hot flashes or night sweats. Psych:  Stress/anxiety.  No depression. Pain: No pain. Review of systems:  All other systems reviewed and found to be negative.  Physical Exam: Blood pressure 123/77, pulse 76, temperature (!) 95.6 F (35.3 C), temperature source Tympanic, resp. rate 18, weight 185 lb 9 oz (84.2 kg). GENERAL:  Well developed, well nourished, woman sitting comfortably in the oncology clinic in no acute distress.  She smells of smoke. MENTAL STATUS:  Alert and oriented to person, place and time. HEAD:Short blonde graying hair. Normocephalic, atraumatic, face symmetric, no Cushingoid features. EYES:Blueeyes. Pupils equal round and reactive to light and accomodation. No conjunctivitis or scleral icterus. JOA:[id number] clear without lesion. Tonguenormal. Mucous membranes moist. RESPIRATORY:Clear to ausculation without rales or rhonchi. CARDIOVASCULAR:Regular rate andrhythmwithout murmur, rub or gallop. ABDOMEN: Soft, non-tender with active bowel  soundsand no hepatosplenomegaly. No masses. SKIN: No palmar changes.  No rashes, ulcers or lesions. EXTREMITIES: No edema, no skin discoloration or tenderness. No palpable cords. LYMPHNODES: No palpable cervical, supraclavicular, axillary or inguinal adenopathy  NEUROLOGICAL: Unremarkable. PSYCH: Appropriate.   Appointment on 07/29/2017  Component Date Value Ref Range Status  . WBC 07/29/2017 4.4  3.6 - 11.0 K/uL Final  . RBC 07/29/2017 4.02  3.80 - 5.20 MIL/uL Final  . Hemoglobin 07/29/2017 13.4  12.0 - 16.0 g/dL Final  . HCT 07/29/2017 38.7  35.0 - 47.0 % Final  . MCV 07/29/2017 96.3  80.0 - 100.0 fL Final  . MCH 07/29/2017 33.4  26.0 - 34.0 pg Final  . MCHC 07/29/2017 34.7  32.0 - 36.0 g/dL Final  . RDW 07/29/2017 15.8* 11.5 - 14.5 % Final  . Platelets 07/29/2017 220  150 - 440 K/uL Final  . Neutrophils Relative % 07/29/2017 73  % Final  . Neutro Abs 07/29/2017 3.3  1.4 -  6.5 K/uL Final  . Lymphocytes Relative 07/29/2017 16  % Final  . Lymphs Abs 07/29/2017 0.7* 1.0 - 3.6 K/uL Final  . Monocytes Relative 07/29/2017 9  % Final  . Monocytes Absolute 07/29/2017 0.4  0.2 - 0.9 K/uL Final  . Eosinophils Relative 07/29/2017 2  % Final  . Eosinophils Absolute 07/29/2017 0.1  0 - 0.7 K/uL Final  . Basophils Relative 07/29/2017 0  % Final  . Basophils Absolute 07/29/2017 0.0  0 - 0.1 K/uL Final  . Sodium 07/29/2017 137  135 - 145 mmol/L Final  . Potassium 07/29/2017 4.2  3.5 - 5.1 mmol/L Final  . Chloride 07/29/2017 102  101 - 111 mmol/L Final  . CO2 07/29/2017 24  22 - 32 mmol/L Final  . Glucose, Bld 07/29/2017 125* 65 - 99 mg/dL Final  . BUN 07/29/2017 13  6 - 20 mg/dL Final  . Creatinine, Ser 07/29/2017 0.72  0.44 - 1.00 mg/dL Final  . Calcium 07/29/2017 9.3  8.9 - 10.3 mg/dL Final  . Total Protein 07/29/2017 6.8  6.5 - 8.1 g/dL Final  . Albumin 07/29/2017 4.0  3.5 - 5.0 g/dL Final  . AST 07/29/2017 22  15 - 41 U/L Final  . ALT 07/29/2017 13* 14 - 54 U/L Final  . Alkaline  Phosphatase 07/29/2017 87  38 - 126 U/L Final  . Total Bilirubin 07/29/2017 0.4  0.3 - 1.2 mg/dL Final  . GFR calc non Af Amer 07/29/2017 >60  >60 mL/min Final  . GFR calc Af Amer 07/29/2017 >60  >60 mL/min Final   Comment: (NOTE) The eGFR has been calculated using the CKD EPI equation. This calculation has not been validated in all clinical situations. eGFR's persistently <60 mL/min signify possible Chronic Kidney Disease.   . Anion gap 07/29/2017 11  5 - 15 Final    Assessment:  Tamara Hall is a 61 y.o. female with clinical stage T3N1M1 rectal cancer.   She presented with a 9 month history of rectal bleeding.  Colonoscopy on 01/14/2017 revealed a frond-like/villous non-obstructing large mass in the rectum. The mass was non-circumferential.  Biopsies revealed high grade dysplasia within an adenoma (? sampling error).  CEA was 31.6 on 01/15/2017.  Abdomen and pelvic CT on 01/13/2017 revealed irregular thickening of the rectum suspicious for carcinoma.  There was a 1.6 cm hypoattenuating lesion in the right hepatic lobe worrisome for metastatic disease.  Chest CT on 01/15/2017 revealed a 9 mm noncalcified left lower lobe pulmonary nodule (new).  Pelvic MRI on 01/16/2017 revealed rectal adenocarcinoma T3N1.  There was extension beyond the muscularis propria (17 mm).  There were mesorectal nodes >= 5 mm.  There was adenopathy on the left side of the mesorectum, including a 9 mm lesion.  The distance from the tumor to the anal sphincter was 8 mm.  PET scan on 01/27/2017 revealed intense metabolic activity associated rectal mass consists with primary rectal carcinoma.  There was a hypermetabolic metastatic lesion to the central LEFT hepatic lobe.  There was metabolic activity associated with small LEFT lower lobe nodule which was most consistent with pulmonary metastasis.  Liver MRI on 01/31/2017 revealed a 2.6 x 2.7 x 2.1 cm lesion in segment VIII of the liver near the junction with segment IVa  c/w a metastatic lesion.  There was a 1.1 x 0.8 cm suspected metastatic lesion peripherally in segment VI of the liver.  CT guided liver biopsy at [facility] on 02/07/2017 revealed malignant cells c/w metastatic colorectal adenocarcinoma.  Tumor was sent for  MMR/MSI, KRAS/NRAS, and BRAF.  She is scheduled to have liver directed therapy (ablation) at [facility] on 03/19/2017.  CEA has been followed: 31.6 on 01/15/2017, 38.1 on 02/18/2017, 25.1 on 03/18/2017, 11.8 on 04/08/2017, 9.2 on 04/22/2017, 9.5 on 05/20/2017, and 9.6 on 06/19/2017.  She is s/p 2 cycle of FOLFOX  (02/18/2017 - 03/04/2017).  She developed transient cold induced neuropathy.  She developed wheezing and hypoxia with cycle #2 felt secondary to oxaliplatin.  She was treated with oxygen by face mask, Benadryl, hydrocortisone, and nebulized albuterol.    She is s/p 4 cycles of FOLFIRI (03/24/2017 - 04/22/2017) with Neulasta support.  Diarrhea was controlled with Imodium.  Abdomen and pelvic CT on 05/16/2017 revealed persistent but decreased asymmetric wall thickening in the rectum.  The medial segment left liver lesion was smaller (2.7 x 2.6 cm to 1.9 x 1.6 cm). A tiny lesion seen in segment VI of the liver on previous MRI was not evident on today's CT scan.  The left lower lobe pulmonary nodule was smaller (9 mm to 6 mm).  There was no new or progressive findings in the abdomen and pelvis.  She underwent CT guided microwave ablation of the liver lesion on 06/06/2017.    She began radiation on 06/18/2017.  She receives daily Xeloda with radiation. She missed treatment on 06/23/2017 and 06/26/2017.  She developed grade III diarrhea.  Radiation has been on hold since 07/07/2017.  She received radiation from 07/15/2017 - 07/17/2017.  Xeloda was decreased to 1150 mg BID.  She was on Lomotil 4 times/day.  Xeloda has been on hold.  She has an 80 pack year smoking history.  She is smoking.  She was treated for bronchitis and reactive airway disease on  06/30/2017.  She completed azithromycin and a prednisone pulse.  Symptomatically, she feels well.  Her diarrhea has resolved.   She is eating and drinking normally. Exam is stable. Potassium is 4.2  Plan: 1.  Labs today: CBC with diff, CMP. 2.  Discuss restarting radiation treatments. Patient to restart treatments today.  3.  Discuss reinitiation of reduced dose Xeloda. 5.  Patient to call clinic if diarrhea returns. 6.  Continue potassium and calcium supplementation. 7.  RTC as already scheduled for MD and labs.    Honor Loh, NP  07/29/2017, 10:40 AM   I saw and evaluated the patient, participating in the key portions of the service and reviewing pertinent diagnostic studies and records. I reviewed the nurse practitioner's note and agree with the findings and the plan.  The assessment and plan were discussed with the patient.  A few questions were asked by the patient and answered.   Lequita Asal, MD 07/29/2017, 10:40 AM

## 2017-07-29 NOTE — Progress Notes (Signed)
Patient states she has had a good week.  Bowels are regular.  Offers no complaints today.

## 2017-07-30 ENCOUNTER — Ambulatory Visit
Admission: RE | Admit: 2017-07-30 | Discharge: 2017-07-30 | Disposition: A | Discharge status: Home / Self Care | Payer: Managed Care, Other (non HMO) | Source: Ambulatory Visit | Attending: Radiation Oncology | Admitting: Radiation Oncology

## 2017-07-30 ENCOUNTER — Ambulatory Visit: Payer: Managed Care, Other (non HMO)

## 2017-07-30 DIAGNOSIS — C20 Malignant neoplasm of rectum: Secondary | ICD-10-CM | POA: Diagnosis not present

## 2017-07-31 ENCOUNTER — Ambulatory Visit
Admission: RE | Admit: 2017-07-31 | Discharge: 2017-07-31 | Disposition: A | Discharge status: Home / Self Care | Payer: Managed Care, Other (non HMO) | Source: Ambulatory Visit | Attending: Radiation Oncology | Admitting: Radiation Oncology

## 2017-07-31 ENCOUNTER — Ambulatory Visit: Payer: Managed Care, Other (non HMO)

## 2017-07-31 DIAGNOSIS — C20 Malignant neoplasm of rectum: Secondary | ICD-10-CM | POA: Diagnosis not present

## 2017-08-01 ENCOUNTER — Ambulatory Visit
Admission: RE | Admit: 2017-08-01 | Discharge: 2017-08-01 | Disposition: A | Discharge status: Home / Self Care | Payer: Managed Care, Other (non HMO) | Source: Ambulatory Visit | Attending: Radiation Oncology | Admitting: Radiation Oncology

## 2017-08-01 DIAGNOSIS — C20 Malignant neoplasm of rectum: Secondary | ICD-10-CM | POA: Diagnosis not present

## 2017-08-03 ENCOUNTER — Encounter: Payer: Self-pay | Admitting: Hematology and Oncology

## 2017-08-04 ENCOUNTER — Ambulatory Visit
Admission: RE | Admit: 2017-08-04 | Discharge: 2017-08-04 | Disposition: A | Discharge status: Home / Self Care | Payer: Managed Care, Other (non HMO) | Source: Ambulatory Visit | Attending: Radiation Oncology | Admitting: Radiation Oncology

## 2017-08-04 DIAGNOSIS — C20 Malignant neoplasm of rectum: Secondary | ICD-10-CM | POA: Diagnosis not present

## 2017-08-05 ENCOUNTER — Inpatient Hospital Stay: Payer: Managed Care, Other (non HMO)

## 2017-08-05 ENCOUNTER — Other Ambulatory Visit: Payer: Self-pay | Admitting: *Deleted

## 2017-08-05 ENCOUNTER — Inpatient Hospital Stay (HOSPITAL_BASED_OUTPATIENT_CLINIC_OR_DEPARTMENT_OTHER): Payer: Managed Care, Other (non HMO) | Admitting: Hematology and Oncology

## 2017-08-05 ENCOUNTER — Ambulatory Visit
Admission: RE | Admit: 2017-08-05 | Discharge: 2017-08-05 | Disposition: A | Discharge status: Home / Self Care | Payer: Managed Care, Other (non HMO) | Source: Ambulatory Visit | Attending: Radiation Oncology | Admitting: Radiation Oncology

## 2017-08-05 ENCOUNTER — Ambulatory Visit: Payer: Managed Care, Other (non HMO)

## 2017-08-05 VITALS — BP 158/74 | HR 88 | Temp 95.7°F | Resp 18 | Wt 185.9 lb

## 2017-08-05 DIAGNOSIS — Z9221 Personal history of antineoplastic chemotherapy: Secondary | ICD-10-CM

## 2017-08-05 DIAGNOSIS — Z8701 Personal history of pneumonia (recurrent): Secondary | ICD-10-CM

## 2017-08-05 DIAGNOSIS — E876 Hypokalemia: Secondary | ICD-10-CM | POA: Diagnosis not present

## 2017-08-05 DIAGNOSIS — C20 Malignant neoplasm of rectum: Secondary | ICD-10-CM

## 2017-08-05 DIAGNOSIS — Z7189 Other specified counseling: Secondary | ICD-10-CM

## 2017-08-05 DIAGNOSIS — C787 Secondary malignant neoplasm of liver and intrahepatic bile duct: Secondary | ICD-10-CM

## 2017-08-05 DIAGNOSIS — I10 Essential (primary) hypertension: Secondary | ICD-10-CM | POA: Diagnosis not present

## 2017-08-05 DIAGNOSIS — Z801 Family history of malignant neoplasm of trachea, bronchus and lung: Secondary | ICD-10-CM

## 2017-08-05 DIAGNOSIS — Z79899 Other long term (current) drug therapy: Secondary | ICD-10-CM | POA: Diagnosis not present

## 2017-08-05 DIAGNOSIS — F17210 Nicotine dependence, cigarettes, uncomplicated: Secondary | ICD-10-CM

## 2017-08-05 DIAGNOSIS — R197 Diarrhea, unspecified: Secondary | ICD-10-CM | POA: Diagnosis not present

## 2017-08-05 DIAGNOSIS — C7802 Secondary malignant neoplasm of left lung: Secondary | ICD-10-CM | POA: Diagnosis not present

## 2017-08-05 DIAGNOSIS — K219 Gastro-esophageal reflux disease without esophagitis: Secondary | ICD-10-CM | POA: Diagnosis not present

## 2017-08-05 DIAGNOSIS — R531 Weakness: Secondary | ICD-10-CM

## 2017-08-05 DIAGNOSIS — Z923 Personal history of irradiation: Secondary | ICD-10-CM

## 2017-08-05 DIAGNOSIS — G893 Neoplasm related pain (acute) (chronic): Secondary | ICD-10-CM

## 2017-08-05 DIAGNOSIS — Z803 Family history of malignant neoplasm of breast: Secondary | ICD-10-CM

## 2017-08-05 LAB — CBC WITH DIFFERENTIAL/PLATELET
Basophils Absolute: 0 10*3/uL (ref 0–0.1)
Basophils Relative: 0 %
Eosinophils Absolute: 0.1 10*3/uL (ref 0–0.7)
Eosinophils Relative: 2 %
HCT: 40.8 % (ref 35.0–47.0)
Hemoglobin: 14 g/dL (ref 12.0–16.0)
Lymphocytes Relative: 12 %
Lymphs Abs: 0.5 10*3/uL — ABNORMAL LOW (ref 1.0–3.6)
MCH: 33.3 pg (ref 26.0–34.0)
MCHC: 34.3 g/dL (ref 32.0–36.0)
MCV: 97.1 fL (ref 80.0–100.0)
Monocytes Absolute: 0.3 10*3/uL (ref 0.2–0.9)
Monocytes Relative: 7 %
Neutro Abs: 3.1 10*3/uL (ref 1.4–6.5)
Neutrophils Relative %: 79 %
Platelets: 234 10*3/uL (ref 150–440)
RBC: 4.2 MIL/uL (ref 3.80–5.20)
RDW: 15.9 % — ABNORMAL HIGH (ref 11.5–14.5)
WBC: 4 10*3/uL (ref 3.6–11.0)

## 2017-08-05 LAB — COMPREHENSIVE METABOLIC PANEL
ALT: 14 U/L (ref 14–54)
AST: 23 U/L (ref 15–41)
Albumin: 4.2 g/dL (ref 3.5–5.0)
Alkaline Phosphatase: 85 U/L (ref 38–126)
Anion gap: 9 (ref 5–15)
BUN: 14 mg/dL (ref 6–20)
CO2: 25 mmol/L (ref 22–32)
Calcium: 9.4 mg/dL (ref 8.9–10.3)
Chloride: 103 mmol/L (ref 101–111)
Creatinine, Ser: 0.78 mg/dL (ref 0.44–1.00)
GFR calc Af Amer: >60 mL/min (ref >60)
GFR calc non Af Amer: >60 mL/min (ref >60)
Glucose, Bld: 153 mg/dL — ABNORMAL HIGH (ref 65–99)
Potassium: 4.2 mmol/L (ref 3.5–5.1)
Sodium: 137 mmol/L (ref 135–145)
Total Bilirubin: 0.5 mg/dL (ref 0.3–1.2)
Total Protein: 7 g/dL (ref 6.5–8.1)

## 2017-08-05 MED ORDER — HYDROCODONE-ACETAMINOPHEN 7.5-300 MG PO TABS: 1.0000 | ORAL_TABLET | Freq: Four times a day (QID) | ORAL | 0 refills | Status: DC | PRN

## 2017-08-05 NOTE — Progress Notes (Signed)
[facility]-  Cancer Center  Clinic day:  08/05/2017   Chief Complaint: Tamara Hall is a 61 y.o. female with stage IV rectal cancer who is seen for weekly assessment during neoadjuvant Xeloda and radiation.  HPI:  The patient was last seen in the medical oncology clinic on 07/29/2017.  At that time, she felt well.  Her diarrhea had resolved.   She was eating and drinking normally. Exam was stable. Potassium was 4.2.  Radiation and Xeloda were restarted.  During the interim, patient notes that she is doing "really good". Patient complains of a "little bit of back pain" x 3 days. Patient taking Hydrocodone 7.5/325mg x 4 tabs a day. Patient continues to smoke 1/2 pack of cigarettes a day. Of note, the exam room has a heavy odor of smoke. She states, "I used to smoke 3 packs a day, so I am doing better". Patient notes marked improvement in her bowels. She is not using Loperamide or Lomotil at this point. Patient continues on her Xeloda with no other perceived side effects. She finishes radiation therapy treatments on 08/13/2017 per her report.    Past Medical History:  Diagnosis Date  . Cancer (HCC)   . Hypertension   . Pneumonia    20+ years ago    Past Surgical History:  Procedure Laterality Date  . ABDOMINAL HYSTERECTOMY    . BREAST SURGERY    . COLONOSCOPY WITH PROPOFOL N/A 01/14/2017   Procedure: COLONOSCOPY WITH PROPOFOL;  Surgeon: Darren Wohl, MD;  Location: [facility] ENDOSCOPY;  Service: Endoscopy;  Laterality: N/A;  . ELBOW SURGERY Right    Has had 4 surgeries on right elbow.  . HAND SURGERY Right 2008  . JOINT REPLACEMENT    . PORTA CATH INSERTION N/A 01/29/2017   Procedure: Porta Cath Insertion;  Surgeon: Jason S Dew, MD;  Location: [facility] INVASIVE CV LAB;  Service: Cardiovascular;  Laterality: N/A;  . TONSILLECTOMY      Family History  Problem Relation Age of Onset  . Lung cancer Mother   . Lung cancer Father   . Brain cancer Maternal Grandmother      Social History:  reports that she has been smoking Cigarettes.  She has a 80.00 pack-year smoking history. She has never used smokeless tobacco. She reports that she does not drink alcohol or use drugs.  She is smoking 1/2 packs/day.  She lives in [place].  She has 3 children (ages 44, 41, and 38).  She smokes 2 packs a day.  She started smoking at age 13.  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  Tamara Hall is her significant other.  She is alone today.  Allergies: No Known Allergies  Current Medications: Current Outpatient Prescriptions  Medication Sig Dispense Refill  . calcium-vitamin D (OSCAL WITH D) 500-200 MG-UNIT tablet Take 1 tablet by mouth daily with breakfast.    . capecitabine (XELODA) 150 MG tablet Take one 150 mg pill twice a day for 5 days each week during radiation with three 500 mg tablets 50 tablet 0  . capecitabine (XELODA) 500 MG tablet Take three  500 mg pills twice a day for 5 days each week during radiation with one 150 mg tablet 150 tablet 0  . Hydrocodone-Acetaminophen 7.5-300 MG TABS Take 1 tablet by mouth every 6 (six) hours as needed (pain). 60 each 0  . lidocaine-prilocaine (EMLA) cream APPLY A SMALL AMOUNT TO PORT SITE AT   LEAST 1 HOUR BEFORE BEING ACCESSED. COVER IN PLASTIC WRAP. 30 g 1  . omeprazole (PRILOSEC) 20 MG capsule TAKE 1 CAPSULE BY MOUTH EVERY DAY 30 capsule 3  . ondansetron (ZOFRAN) 8 MG tablet Take 1 tablet (8 mg total) by mouth every 8 (eight) hours as needed. 30 tablet 2  . potassium chloride SA (K-DUR,KLOR-CON) 20 MEQ tablet Take 1 tablet (20 mEq total) by mouth daily. 20 tablet 0  . albuterol (PROVENTIL HFA;VENTOLIN HFA) 108 (90 Base) MCG/ACT inhaler Inhale 2 puffs into the lungs every 6 (six) hours as needed for wheezing or shortness of breath. (Patient not taking: Reported on 08/05/2017) 1 Inhaler 2  . LORazepam (ATIVAN) 0.5 MG tablet Take 1 tablet (0.5 mg total) by mouth every 6 (six) hours as  needed. For nausea or vomiting (Patient not taking: Reported on 06/30/2017) 30 tablet 1  . Magnesium Hydroxide (MILK OF MAGNESIA PO) Take 30 mLs by mouth at bedtime.      No current facility-administered medications for this visit.     Review of Systems:  GENERAL:  Feels "well".  No fevers or sweats.  Weight stable. PERFORMANCE STATUS (ECOG):  1 HEENT:  No visual changes, runny nose, sore throat, mouth sores or tenderness. Lungs:  No shortness of breath. No cough.  No hemoptysis. Cardiac:  No chest pain, palpitations, orthopnea, or PND.  Elevated BP due to anxiety. GI:  No nausea, vomiting, diarrhea, constipation or melena.  No rectal bleeding. GU:  No urgency, frequency, dysuria, or hematuria.  External irritation secondary to radiation. Musculoskeletal:  Little back pain.  No joint pain.  No muscle tenderness. Extremities:  No pain or swelling. Skin:  No rashes or skin changes. Neuro:  No headache, numbness or weakness, balance or coordination issues. Endocrine:  No diabetes, thyroid issues, hot flashes or night sweats. Psych:  Stress/anxiety.  No depression. Pain: Back pain. Review of systems:  All other systems reviewed and found to be negative.  Physical Exam: Blood pressure (!) 158/74, pulse 88, temperature (!) 95.7 F (35.4 C), temperature source Tympanic, resp. rate 18, weight 185 lb 14.4 oz (84.3 kg). GENERAL:  Well developed, well nourished, woman sitting comfortably in the oncology clinic in no acute distress.  She smells of smoke. MENTAL STATUS:  Alert and oriented to person, place and time. HEAD:Short blonde graying hair. Normocephalic, atraumatic, face symmetric, no Cushingoid features. EYES:Blueeyes. Pupils equal round and reactive to light and accomodation. No conjunctivitis or scleral icterus. DGL:OVFIEPPIRJ clear without lesion. Tonguenormal. Mucous membranes moist. RESPIRATORY:Clear to ausculation without rales or rhonchi. CARDIOVASCULAR:Regular rate  andrhythmwithout murmur, rub or gallop. ABDOMEN: Soft, non-tender with active bowel soundsand no hepatosplenomegaly. No masses. SKIN: No palmar changes.  No rashes, ulcers or lesions. EXTREMITIES: No edema, no skin discoloration or tenderness. No palpable cords. LYMPHNODES: No palpable cervical, supraclavicular, axillary or inguinal adenopathy  NEUROLOGICAL: Unremarkable. PSYCH: Appropriate.   Appointment on 08/05/2017  Component Date Value Ref Range Status  . WBC 08/05/2017 4.0  3.6 - 11.0 K/uL Final  . RBC 08/05/2017 4.20  3.80 - 5.20 MIL/uL Final  . Hemoglobin 08/05/2017 14.0  12.0 - 16.0 g/dL Final  . HCT 08/05/2017 40.8  35.0 - 47.0 % Final  . MCV 08/05/2017 97.1  80.0 - 100.0 fL Final  . MCH 08/05/2017 33.3  26.0 - 34.0 pg Final  . MCHC 08/05/2017 34.3  32.0 - 36.0 g/dL Final  . RDW 08/05/2017 15.9* 11.5 - 14.5 % Final  . Platelets 08/05/2017 234  150 - 440  K/uL Final  . Neutrophils Relative % 08/05/2017 79  % Final  . Neutro Abs 08/05/2017 3.1  1.4 - 6.5 K/uL Final  . Lymphocytes Relative 08/05/2017 12  % Final  . Lymphs Abs 08/05/2017 0.5* 1.0 - 3.6 K/uL Final  . Monocytes Relative 08/05/2017 7  % Final  . Monocytes Absolute 08/05/2017 0.3  0.2 - 0.9 K/uL Final  . Eosinophils Relative 08/05/2017 2  % Final  . Eosinophils Absolute 08/05/2017 0.1  0 - 0.7 K/uL Final  . Basophils Relative 08/05/2017 0  % Final  . Basophils Absolute 08/05/2017 0.0  0 - 0.1 K/uL Final  . Sodium 08/05/2017 137  135 - 145 mmol/L Final  . Potassium 08/05/2017 4.2  3.5 - 5.1 mmol/L Final  . Chloride 08/05/2017 103  101 - 111 mmol/L Final  . CO2 08/05/2017 25  22 - 32 mmol/L Final  . Glucose, Bld 08/05/2017 153* 65 - 99 mg/dL Final  . BUN 08/05/2017 14  6 - 20 mg/dL Final  . Creatinine, Ser 08/05/2017 0.78  0.44 - 1.00 mg/dL Final  . Calcium 08/05/2017 9.4  8.9 - 10.3 mg/dL Final  . Total Protein 08/05/2017 7.0  6.5 - 8.1 g/dL Final  . Albumin 08/05/2017 4.2  3.5 - 5.0 g/dL Final  . AST  08/05/2017 23  15 - 41 U/L Final  . ALT 08/05/2017 14  14 - 54 U/L Final  . Alkaline Phosphatase 08/05/2017 85  38 - 126 U/L Final  . Total Bilirubin 08/05/2017 0.5  0.3 - 1.2 mg/dL Final  . GFR calc non Af Amer 08/05/2017 >60  >60 mL/min Final  . GFR calc Af Amer 08/05/2017 >60  >60 mL/min Final   Comment: (NOTE) The eGFR has been calculated using the CKD EPI equation. This calculation has not been validated in all clinical situations. eGFR's persistently <60 mL/min signify possible Chronic Kidney Disease.   . Anion gap 08/05/2017 9  5 - 15 Final    Assessment:  Tamara Hall is a 61 y.o. female with clinical stage T3N1M1 rectal cancer.   She presented with a 9 month history of rectal bleeding.  Colonoscopy on 01/14/2017 revealed a frond-like/villous non-obstructing large mass in the rectum. The mass was non-circumferential.  Biopsies revealed high grade dysplasia within an adenoma (? sampling error).  CEA was 31.6 on 01/15/2017.  Abdomen and pelvic CT on 01/13/2017 revealed irregular thickening of the rectum suspicious for carcinoma.  There was a 1.6 cm hypoattenuating lesion in the right hepatic lobe worrisome for metastatic disease.  Chest CT on 01/15/2017 revealed a 9 mm noncalcified left lower lobe pulmonary nodule (new).  Pelvic MRI on 01/16/2017 revealed rectal adenocarcinoma T3N1.  There was extension beyond the muscularis propria (17 mm).  There were mesorectal nodes >= 5 mm.  There was adenopathy on the left side of the mesorectum, including a 9 mm lesion.  The distance from the tumor to the anal sphincter was 8 mm.  PET scan on 01/27/2017 revealed intense metabolic activity associated rectal mass consists with primary rectal carcinoma.  There was a hypermetabolic metastatic lesion to the central LEFT hepatic lobe.  There was metabolic activity associated with small LEFT lower lobe nodule which was most consistent with pulmonary metastasis.  Liver MRI on 01/31/2017 revealed a 2.6 x  2.7 x 2.1 cm lesion in segment VIII of the liver near the junction with segment IVa c/w a metastatic lesion.  There was a 1.1 x 0.8 cm suspected metastatic lesion peripherally in   segment VI of the liver.  CT guided liver biopsy at [facility] on 02/07/2017 revealed malignant cells c/w metastatic colorectal adenocarcinoma.  Tumor was sent for  MMR/MSI, KRAS/NRAS, and BRAF.  She is scheduled to have liver directed therapy (ablation) at [facility] on 03/19/2017.  CEA has been followed: 31.6 on 01/15/2017, 38.1 on 02/18/2017, 25.1 on 03/18/2017, 11.8 on 04/08/2017, 9.2 on 04/22/2017, 9.5 on 05/20/2017, and 9.6 on 06/19/2017.  She is s/p 2 cycle of FOLFOX  (02/18/2017 - 03/04/2017).  She developed transient cold induced neuropathy.  She developed wheezing and hypoxia with cycle #2 felt secondary to oxaliplatin.  She was treated with oxygen by face mask, Benadryl, hydrocortisone, and nebulized albuterol.    She is s/p 4 cycles of FOLFIRI (03/24/2017 - 04/22/2017) with Neulasta support.  Diarrhea was controlled with Imodium.  Abdomen and pelvic CT on 05/16/2017 revealed persistent but decreased asymmetric wall thickening in the rectum.  The medial segment left liver lesion was smaller (2.7 x 2.6 cm to 1.9 x 1.6 cm). A tiny lesion seen in segment VI of the liver on previous MRI was not evident on today's CT scan.  The left lower lobe pulmonary nodule was smaller (9 mm to 6 mm).  There was no new or progressive findings in the abdomen and pelvis.  She underwent CT guided microwave ablation of the liver lesion on 06/06/2017.    She began radiation on 06/18/2017.  She receives daily Xeloda with radiation. She missed treatment on 06/23/2017 and 06/26/2017.  She developed grade III diarrhea.  Radiation has been on hold since 07/07/2017.  She received radiation from 07/15/2017 - 07/17/2017.  Xeloda was decreased to 1150 mg BID.  She was on Lomotil 4 times/day.  Xeloda has been on hold.  She has an 80 pack year smoking history.   She is smoking.  She was treated for bronchitis and reactive airway disease on 06/30/2017.  She completed azithromycin and a prednisone pulse.  Symptomatically, she feels well.  Her diarrhea has resolved.  Patient has had lower back pain x 3 days. She is eating and drinking normally. Exam is stable. She continues to smoke.  Potassium is 4.2  Plan: 1.  Labs today: CBC with diff, CMP. 2.  Continue Xeloda as prescribed.  3.  Continue to follow up with radiation oncology as scheduled.  4.  Patient to call clinic if diarrhea returns. 5.  Continue calcium supplementation. 6.  Refill Rx: hydrocodone 7.5/325mg q6h PRN (Disp #60) 7.  RTC on 08/12/2017 for MD assessment and labs (CBC with diff, CMP)    Bryan Gray, NP  08/05/2017, 9:51 AM   I saw and evaluated the patient, participating in the key portions of the service and reviewing pertinent diagnostic studies and records. I reviewed the nurse practitioner's note and agree with the findings and the plan.  The assessment and plan were discussed with the patient.  A few questions were asked by the patient and answered.   Melissa C Corcoran, MD 08/05/2017, 9:51 AM   

## 2017-08-05 NOTE — Progress Notes (Signed)
Here for follow up. C/o back pain x 3 d-see follow up eval

## 2017-08-06 ENCOUNTER — Ambulatory Visit
Admission: RE | Admit: 2017-08-06 | Discharge: 2017-08-06 | Disposition: A | Discharge status: Home / Self Care | Payer: Managed Care, Other (non HMO) | Source: Ambulatory Visit | Attending: Radiation Oncology | Admitting: Radiation Oncology

## 2017-08-06 ENCOUNTER — Ambulatory Visit: Payer: Managed Care, Other (non HMO)

## 2017-08-06 DIAGNOSIS — C20 Malignant neoplasm of rectum: Secondary | ICD-10-CM | POA: Diagnosis not present

## 2017-08-07 ENCOUNTER — Ambulatory Visit: Payer: Managed Care, Other (non HMO)

## 2017-08-07 ENCOUNTER — Ambulatory Visit
Admission: RE | Admit: 2017-08-07 | Discharge: 2017-08-07 | Disposition: A | Discharge status: Home / Self Care | Payer: Managed Care, Other (non HMO) | Source: Ambulatory Visit | Attending: Radiation Oncology | Admitting: Radiation Oncology

## 2017-08-07 DIAGNOSIS — C20 Malignant neoplasm of rectum: Secondary | ICD-10-CM | POA: Diagnosis not present

## 2017-08-08 ENCOUNTER — Ambulatory Visit
Admission: RE | Admit: 2017-08-08 | Discharge: 2017-08-08 | Disposition: A | Discharge status: Home / Self Care | Payer: Managed Care, Other (non HMO) | Source: Ambulatory Visit | Attending: Radiation Oncology | Admitting: Radiation Oncology

## 2017-08-08 ENCOUNTER — Ambulatory Visit: Payer: Managed Care, Other (non HMO)

## 2017-08-08 DIAGNOSIS — C20 Malignant neoplasm of rectum: Secondary | ICD-10-CM | POA: Diagnosis not present

## 2017-08-09 ENCOUNTER — Encounter: Payer: Self-pay | Admitting: Hematology and Oncology

## 2017-08-11 ENCOUNTER — Ambulatory Visit
Admission: RE | Admit: 2017-08-11 | Discharge: 2017-08-11 | Disposition: A | Discharge status: Home / Self Care | Payer: Managed Care, Other (non HMO) | Source: Ambulatory Visit | Attending: Radiation Oncology | Admitting: Radiation Oncology

## 2017-08-11 DIAGNOSIS — C20 Malignant neoplasm of rectum: Secondary | ICD-10-CM | POA: Diagnosis not present

## 2017-08-12 ENCOUNTER — Inpatient Hospital Stay (HOSPITAL_BASED_OUTPATIENT_CLINIC_OR_DEPARTMENT_OTHER): Payer: Managed Care, Other (non HMO) | Admitting: Hematology and Oncology

## 2017-08-12 ENCOUNTER — Ambulatory Visit
Admission: RE | Admit: 2017-08-12 | Discharge: 2017-08-12 | Disposition: A | Discharge status: Home / Self Care | Payer: Managed Care, Other (non HMO) | Source: Ambulatory Visit | Attending: Radiation Oncology | Admitting: Radiation Oncology

## 2017-08-12 ENCOUNTER — Inpatient Hospital Stay: Payer: Managed Care, Other (non HMO) | Attending: Hematology and Oncology

## 2017-08-12 VITALS — BP 144/75 | HR 79 | Temp 96.0°F | Resp 18 | Wt 188.1 lb

## 2017-08-12 DIAGNOSIS — R197 Diarrhea, unspecified: Secondary | ICD-10-CM | POA: Insufficient documentation

## 2017-08-12 DIAGNOSIS — F17210 Nicotine dependence, cigarettes, uncomplicated: Secondary | ICD-10-CM | POA: Insufficient documentation

## 2017-08-12 DIAGNOSIS — C787 Secondary malignant neoplasm of liver and intrahepatic bile duct: Secondary | ICD-10-CM | POA: Insufficient documentation

## 2017-08-12 DIAGNOSIS — Z7189 Other specified counseling: Secondary | ICD-10-CM

## 2017-08-12 DIAGNOSIS — Z801 Family history of malignant neoplasm of trachea, bronchus and lung: Secondary | ICD-10-CM | POA: Diagnosis not present

## 2017-08-12 DIAGNOSIS — G629 Polyneuropathy, unspecified: Secondary | ICD-10-CM | POA: Diagnosis not present

## 2017-08-12 DIAGNOSIS — M545 Low back pain: Secondary | ICD-10-CM | POA: Insufficient documentation

## 2017-08-12 DIAGNOSIS — C20 Malignant neoplasm of rectum: Secondary | ICD-10-CM | POA: Insufficient documentation

## 2017-08-12 DIAGNOSIS — Z809 Family history of malignant neoplasm, unspecified: Secondary | ICD-10-CM

## 2017-08-12 DIAGNOSIS — Z923 Personal history of irradiation: Secondary | ICD-10-CM | POA: Insufficient documentation

## 2017-08-12 DIAGNOSIS — Z8701 Personal history of pneumonia (recurrent): Secondary | ICD-10-CM | POA: Insufficient documentation

## 2017-08-12 DIAGNOSIS — Z79899 Other long term (current) drug therapy: Secondary | ICD-10-CM

## 2017-08-12 DIAGNOSIS — C7802 Secondary malignant neoplasm of left lung: Secondary | ICD-10-CM

## 2017-08-12 DIAGNOSIS — I10 Essential (primary) hypertension: Secondary | ICD-10-CM | POA: Diagnosis not present

## 2017-08-12 DIAGNOSIS — G893 Neoplasm related pain (acute) (chronic): Secondary | ICD-10-CM

## 2017-08-12 LAB — COMPREHENSIVE METABOLIC PANEL
ALT: 13 U/L — ABNORMAL LOW (ref 14–54)
AST: 19 U/L (ref 15–41)
Albumin: 3.8 g/dL (ref 3.5–5.0)
Alkaline Phosphatase: 80 U/L (ref 38–126)
Anion gap: 7 (ref 5–15)
BUN: 11 mg/dL (ref 6–20)
CO2: 25 mmol/L (ref 22–32)
Calcium: 9.2 mg/dL (ref 8.9–10.3)
Chloride: 102 mmol/L (ref 101–111)
Creatinine, Ser: 0.74 mg/dL (ref 0.44–1.00)
GFR calc Af Amer: >60 mL/min (ref >60)
GFR calc non Af Amer: >60 mL/min (ref >60)
Glucose, Bld: 144 mg/dL — ABNORMAL HIGH (ref 65–99)
Potassium: 3.8 mmol/L (ref 3.5–5.1)
Sodium: 134 mmol/L — ABNORMAL LOW (ref 135–145)
Total Bilirubin: 0.3 mg/dL (ref 0.3–1.2)
Total Protein: 6.8 g/dL (ref 6.5–8.1)

## 2017-08-12 LAB — CBC WITH DIFFERENTIAL/PLATELET
Basophils Absolute: 0 10*3/uL (ref 0–0.1)
Basophils Relative: 0 %
Eosinophils Absolute: 0.1 10*3/uL (ref 0–0.7)
Eosinophils Relative: 3 %
HCT: 39.3 % (ref 35.0–47.0)
Hemoglobin: 13.5 g/dL (ref 12.0–16.0)
Lymphocytes Relative: 12 %
Lymphs Abs: 0.5 10*3/uL — ABNORMAL LOW (ref 1.0–3.6)
MCH: 33 pg (ref 26.0–34.0)
MCHC: 34.4 g/dL (ref 32.0–36.0)
MCV: 95.9 fL (ref 80.0–100.0)
Monocytes Absolute: 0.3 10*3/uL (ref 0.2–0.9)
Monocytes Relative: 7 %
Neutro Abs: 3.2 10*3/uL (ref 1.4–6.5)
Neutrophils Relative %: 78 %
Platelets: 226 10*3/uL (ref 150–440)
RBC: 4.1 MIL/uL (ref 3.80–5.20)
RDW: 16.7 % — ABNORMAL HIGH (ref 11.5–14.5)
WBC: 4 10*3/uL (ref 3.6–11.0)

## 2017-08-12 NOTE — Progress Notes (Signed)
Patient states she is having some pain 3/10 in her abdomen this morning.  States it feels like menstrual cramps.

## 2017-08-12 NOTE — Progress Notes (Signed)
[facility] day:  08/12/2017   Chief Complaint: Tamara Hall is a 61 y.o. female with stage IV rectal cancer who is seen for weekly assessment during neoadjuvant Xeloda and radiation.  HPI:  The patient was last seen in the medical oncology clinic on 08/05/2017.  At that time, she was doing well.  She noted some low back pain.  She denied any diarrhea.  Radiation and Xeloda continued.  Smoking cessation was encouraged.  During the interim, patient has continued to improve overall. She continues her prescribed Xeloda and radiation therapy treatments. She is scheduled to complete radiation on 08/15/2017.  Patient is to follow up with Dr. Cecil Cobbs following radiation for further discussion regarding surgical intervention. Additionally, patient has repeat imaging scheduled with Dr. Fenton Malling on 08/25/2017. Patient's previously reported diarrhea has resolved. She has daily "loose stools". Patient denies any bleeding; no hematochezia, melena, or vaginal bleeding. She has not experienced any B symptoms or infections. Patient is eating well, with no demonstrated weight loss.    Past Medical History:  Diagnosis Date  . Cancer ([place])   . Hypertension   . Pneumonia    20+ years ago    Past Surgical History:  Procedure Laterality Date  . ABDOMINAL HYSTERECTOMY    . BREAST SURGERY    . ELBOW SURGERY Right    Has had 4 surgeries on right elbow.  Marland Kitchen HAND SURGERY Right 2008  . JOINT REPLACEMENT    . TONSILLECTOMY      Family History  Problem Relation Age of Onset  . Lung cancer Mother   . Lung cancer Father   . Brain cancer Maternal Grandmother     Social History:  reports that she has been smoking cigarettes.  She has a 80.00 pack-year smoking history. she has never used smokeless tobacco. She reports that she does not drink alcohol or use drugs.  She is smoking 1/2 packs/day.  She lives in [place].  She has 3 children (ages 82, 2, and 33).  She smokes  2 packs a day.  She started smoking at age 27.  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  [name] is her significant other.  She is alone today.  Allergies: No Known Allergies  Current Medications: Current Outpatient Medications  Medication Sig Dispense Refill  . calcium-vitamin D (OSCAL WITH D) 500-200 MG-UNIT tablet Take 1 tablet by mouth daily with breakfast.    . capecitabine (XELODA) 150 MG tablet Take one 150 mg pill twice a day for 5 days each week during radiation with three 500 mg tablets 50 tablet 0  . capecitabine (XELODA) 500 MG tablet Take three  500 mg pills twice a day for 5 days each week during radiation with one 150 mg tablet 150 tablet 0  . Hydrocodone-Acetaminophen 7.5-300 MG TABS Take 1 tablet by mouth every 6 (six) hours as needed (pain). 60 each 0  . lidocaine-prilocaine (EMLA) cream APPLY A SMALL AMOUNT TO PORT SITE AT LEAST 1 HOUR BEFORE BEING ACCESSED. COVER IN PLASTIC WRAP. 30 g 1  . omeprazole (PRILOSEC) 20 MG capsule TAKE 1 CAPSULE BY MOUTH EVERY DAY 30 capsule 3  . ondansetron (ZOFRAN) 8 MG tablet Take 1 tablet (8 mg total) by mouth every 8 (eight) hours as needed. 30 tablet 2  . albuterol (PROVENTIL HFA;VENTOLIN HFA) 108 (90 Base) MCG/ACT inhaler Inhale 2 puffs into the lungs every 6 (six)  hours as needed for wheezing or shortness of breath. (Patient not taking: Reported on 08/05/2017) 1 Inhaler 2  . LORazepam (ATIVAN) 0.5 MG tablet Take 1 tablet (0.5 mg total) by mouth every 6 (six) hours as needed. For nausea or vomiting (Patient not taking: Reported on 06/30/2017) 30 tablet 1  . Magnesium Hydroxide (MILK OF MAGNESIA PO) Take 30 mLs by mouth at bedtime.     . potassium chloride SA (K-DUR,KLOR-CON) 20 MEQ tablet Take 1 tablet (20 mEq total) by mouth daily. (Patient not taking: Reported on 08/12/2017) 20 tablet 0   No current facility-administered medications for this visit.     Review of Systems:  GENERAL:   Feels "well".  No fevers or sweats.  Weight up 3 pounds PERFORMANCE STATUS (ECOG):  1 HEENT:  No visual changes, runny nose, sore throat, mouth sores or tenderness. Lungs:  No shortness of breath. No cough.  No hemoptysis. Cardiac:  No chest pain, palpitations, orthopnea, or PND.  Elevated BP due to anxiety. GI:  Little loose stool.  Eating well.  No nausea, vomiting, diarrhea, constipation or melena.  No rectal bleeding. GU:  No urgency, frequency, dysuria, or hematuria.  External irritation secondary to radiation. Musculoskeletal:  Little back pain.  No joint pain.  No muscle tenderness. Extremities:  No pain or swelling. Skin:  No rashes or skin changes. Neuro:  No headache, numbness or weakness, balance or coordination issues. Endocrine:  No diabetes, thyroid issues, hot flashes or night sweats. Psych:  Stress/anxiety.  No depression. Pain: No back pain. Review of systems:  All other systems reviewed and found to be negative.  Physical Exam: Blood pressure (!) 144/75, pulse 79, temperature (!) 96 F (35.6 C), resp. rate 18, weight 188 lb 1 oz (85.3 kg). GENERAL:  Well developed, well nourished, woman sitting comfortably in the oncology clinic in no acute distress.  She smells of smoke. MENTAL STATUS:  Alert and oriented to person, place and time. HEAD:Short blonde graying hair. Normocephalic, atraumatic, face symmetric, no Cushingoid features. EYES:Blueeyes. Pupils equal round and reactive to light and accomodation. No conjunctivitis or scleral icterus. WVP:XTGGYIRSWN clear without lesion. Tonguenormal. Mucous membranes moist. RESPIRATORY:Clear to ausculation without rales or rhonchi. CARDIOVASCULAR:Regular rate andrhythmwithout murmur, rub or gallop. ABDOMEN: Soft, non-tender with active bowel soundsand no hepatosplenomegaly. No masses. SKIN: No palmar changes.  No rashes, ulcers or lesions. EXTREMITIES: No edema, no skin discoloration or tenderness. No  palpable cords. LYMPHNODES: No palpable cervical, supraclavicular, axillary or inguinal adenopathy  NEUROLOGICAL: Unremarkable. PSYCH: Appropriate.   Appointment on 08/12/2017  Component Date Value Ref Range Status  . Sodium 08/12/2017 134* 135 - 145 mmol/L Final  . Potassium 08/12/2017 3.8  3.5 - 5.1 mmol/L Final  . Chloride 08/12/2017 102  101 - 111 mmol/L Final  . CO2 08/12/2017 25  22 - 32 mmol/L Final  . Glucose, Bld 08/12/2017 144* 65 - 99 mg/dL Final  . BUN 08/12/2017 11  6 - 20 mg/dL Final  . Creatinine, Ser 08/12/2017 0.74  0.44 - 1.00 mg/dL Final  . Calcium 08/12/2017 9.2  8.9 - 10.3 mg/dL Final  . Total Protein 08/12/2017 6.8  6.5 - 8.1 g/dL Final  . Albumin 08/12/2017 3.8  3.5 - 5.0 g/dL Final  . AST 08/12/2017 19  15 - 41 U/L Final  . ALT 08/12/2017 13* 14 - 54 U/L Final  . Alkaline Phosphatase 08/12/2017 80  38 - 126 U/L Final  . Total Bilirubin 08/12/2017 0.3  0.3 - 1.2 mg/dL Final  .  GFR calc non Af Amer 08/12/2017 >60  >60 mL/min Final  . GFR calc Af Amer 08/12/2017 >60  >60 mL/min Final   Comment: (NOTE) The eGFR has been calculated using the CKD EPI equation. This calculation has not been validated in all clinical situations. eGFR's persistently <60 mL/min signify possible Chronic Kidney Disease.   . Anion gap 08/12/2017 7  5 - 15 Final  . WBC 08/12/2017 4.0  3.6 - 11.0 K/uL Final  . RBC 08/12/2017 4.10  3.80 - 5.20 MIL/uL Final  . Hemoglobin 08/12/2017 13.5  12.0 - 16.0 g/dL Final  . HCT 08/12/2017 39.3  35.0 - 47.0 % Final  . MCV 08/12/2017 95.9  80.0 - 100.0 fL Final  . MCH 08/12/2017 33.0  26.0 - 34.0 pg Final  . MCHC 08/12/2017 34.4  32.0 - 36.0 g/dL Final  . RDW 08/12/2017 16.7* 11.5 - 14.5 % Final  . Platelets 08/12/2017 226  150 - 440 K/uL Final  . Neutrophils Relative % 08/12/2017 78  % Final  . Neutro Abs 08/12/2017 3.2  1.4 - 6.5 K/uL Final  . Lymphocytes Relative 08/12/2017 12  % Final  . Lymphs Abs 08/12/2017 0.5* 1.0 - 3.6 K/uL Final  .  Monocytes Relative 08/12/2017 7  % Final  . Monocytes Absolute 08/12/2017 0.3  0.2 - 0.9 K/uL Final  . Eosinophils Relative 08/12/2017 3  % Final  . Eosinophils Absolute 08/12/2017 0.1  0 - 0.7 K/uL Final  . Basophils Relative 08/12/2017 0  % Final  . Basophils Absolute 08/12/2017 0.0  0 - 0.1 K/uL Final    Assessment:  Tamara Hall is a 61 y.o. female with clinical stage T3N1M1 rectal cancer.   She presented with a 9 month history of rectal bleeding.  Colonoscopy on 01/14/2017 revealed a frond-like/villous non-obstructing large mass in the rectum. The mass was non-circumferential.  Biopsies revealed high grade dysplasia within an adenoma (? sampling error).  CEA was 31.6 on 01/15/2017.  Abdomen and pelvic CT on 01/13/2017 revealed irregular thickening of the rectum suspicious for carcinoma.  There was a 1.6 cm hypoattenuating lesion in the right hepatic lobe worrisome for metastatic disease.  Chest CT on 01/15/2017 revealed a 9 mm noncalcified left lower lobe pulmonary nodule (new).  Pelvic MRI on 01/16/2017 revealed rectal adenocarcinoma T3N1.  There was extension beyond the muscularis propria (17 mm).  There were mesorectal nodes >= 5 mm.  There was adenopathy on the left side of the mesorectum, including a 9 mm lesion.  The distance from the tumor to the anal sphincter was 8 mm.  PET scan on 01/27/2017 revealed intense metabolic activity associated rectal mass consists with primary rectal carcinoma.  There was a hypermetabolic metastatic lesion to the central LEFT hepatic lobe.  There was metabolic activity associated with small LEFT lower lobe nodule which was most consistent with pulmonary metastasis.  Liver MRI on 01/31/2017 revealed a 2.6 x 2.7 x 2.1 cm lesion in segment VIII of the liver near the junction with segment IVa c/w a metastatic lesion.  There was a 1.1 x 0.8 cm suspected metastatic lesion peripherally in segment VI of the liver.  CT guided liver biopsy at [facility] on 02/07/2017  revealed malignant cells c/w metastatic colorectal adenocarcinoma.  Tumor was sent for  MMR/MSI, KRAS/NRAS, and BRAF.  She is scheduled to have liver directed therapy (ablation) at [facility] on 03/19/2017.  CEA has been followed: 31.6 on 01/15/2017, 38.1 on 02/18/2017, 25.1 on 03/18/2017, 11.8 on 04/08/2017, 9.2 on  04/22/2017, 9.5 on 05/20/2017, and 9.6 on 06/19/2017.  She is s/p 2 cycle of FOLFOX  (02/18/2017 - 03/04/2017).  She developed transient cold induced neuropathy.  She developed wheezing and hypoxia with cycle #2 felt secondary to oxaliplatin.  She was treated with oxygen by face mask, Benadryl, hydrocortisone, and nebulized albuterol.    She is s/p 4 cycles of FOLFIRI (03/24/2017 - 04/22/2017) with Neulasta support.  Diarrhea was controlled with Imodium.  Abdomen and pelvic CT on 05/16/2017 revealed persistent but decreased asymmetric wall thickening in the rectum.  The medial segment left liver lesion was smaller (2.7 x 2.6 cm to 1.9 x 1.6 cm). A tiny lesion seen in segment VI of the liver on previous MRI was not evident on today's CT scan.  The left lower lobe pulmonary nodule was smaller (9 mm to 6 mm).  There was no new or progressive findings in the abdomen and pelvis.  She underwent CT guided microwave ablation of the liver lesion on 06/06/2017.    She began radiation on 06/18/2017.  She receives daily Xeloda with radiation. She missed treatment on 06/23/2017 and 06/26/2017.  She developed grade III diarrhea.  Radiation has been on hold since 07/07/2017.  She received radiation from 07/15/2017 - 07/17/2017.  Xeloda was decreased to 1150 mg BID.  Radiation completes on 08/15/2017.  She has an 80 pack year smoking history.  She is smoking.  She was treated for bronchitis and reactive airway disease on 06/30/2017.  She completed azithromycin and a prednisone pulse.  Symptomatically, she feels well.  Her diarrhea has resolved.   She is eating and drinking normally. Exam is stable. She  continues to smoke.  Potassium is 3.8.  Plan: 1.  Labs today: CBC with diff, CMP. 2.  Continue Xeloda until radiation complete.  3.  Follow up with radiation oncology as scheduled.  4.  Patient to call clinic if diarrhea returns. 5.  Discuss pain control. Patient notes that her pain is well controlled on the current dose of Hydrocodone 7.5/325mg. Continue dose PRN. 6.  Schedule chest CT without contrast on 08/15/2017. 7.  Dr. Mike Gip spoke with Dr. Lanelle Bal via phone today. Plan of care reviewed. Imaging scheduled with Dr. Fenton Malling on 08/25/2017. Dr. Lanelle Bal to add additional studies (MRI) when the patient comes in. Patient is scheduled to follow up with Dr. Lanelle Bal on 09/12/2017 to discuss surgical intervention.  8.  RTC in 1 week for MD assessment and to review the results of chest CT.    Honor Loh, NP  08/12/2017, 2:39 PM   I saw and evaluated the patient, participating in the key portions of the service and reviewing pertinent diagnostic studies and records. I reviewed the nurse practitioner's note and agree with the findings and the plan.  The assessment and plan were discussed with the patient.  A few questions were asked by the patient and answered.   Lequita Asal, MD 08/12/2017, 2:39 PM

## 2017-08-13 ENCOUNTER — Ambulatory Visit
Admission: RE | Admit: 2017-08-13 | Discharge: 2017-08-13 | Disposition: A | Discharge status: Home / Self Care | Payer: Managed Care, Other (non HMO) | Source: Ambulatory Visit | Attending: Radiation Oncology | Admitting: Radiation Oncology

## 2017-08-13 DIAGNOSIS — C20 Malignant neoplasm of rectum: Secondary | ICD-10-CM | POA: Diagnosis not present

## 2017-08-14 ENCOUNTER — Ambulatory Visit
Admission: RE | Admit: 2017-08-14 | Discharge: 2017-08-14 | Disposition: A | Discharge status: Home / Self Care | Payer: Managed Care, Other (non HMO) | Source: Ambulatory Visit | Attending: Radiation Oncology | Admitting: Radiation Oncology

## 2017-08-14 DIAGNOSIS — C20 Malignant neoplasm of rectum: Secondary | ICD-10-CM | POA: Diagnosis not present

## 2017-08-15 ENCOUNTER — Ambulatory Visit
Admission: RE | Admit: 2017-08-15 | Discharge: 2017-08-15 | Disposition: A | Discharge status: Home / Self Care | Payer: Managed Care, Other (non HMO) | Source: Ambulatory Visit | Attending: Radiation Oncology | Admitting: Radiation Oncology

## 2017-08-15 DIAGNOSIS — C20 Malignant neoplasm of rectum: Secondary | ICD-10-CM | POA: Diagnosis not present

## 2017-08-17 ENCOUNTER — Encounter: Payer: Self-pay | Admitting: Hematology and Oncology

## 2017-08-19 ENCOUNTER — Ambulatory Visit: Payer: Managed Care, Other (non HMO)

## 2017-08-19 ENCOUNTER — Other Ambulatory Visit: Payer: Self-pay | Admitting: Urgent Care

## 2017-08-19 ENCOUNTER — Ambulatory Visit
Admission: RE | Admit: 2017-08-19 | Discharge: 2017-08-19 | Disposition: A | Discharge status: Home / Self Care | Payer: Managed Care, Other (non HMO) | Source: Ambulatory Visit | Attending: Urgent Care | Admitting: Urgent Care

## 2017-08-19 DIAGNOSIS — J438 Other emphysema: Secondary | ICD-10-CM | POA: Diagnosis not present

## 2017-08-19 DIAGNOSIS — I700 Atherosclerosis of aorta: Secondary | ICD-10-CM | POA: Insufficient documentation

## 2017-08-19 DIAGNOSIS — C20 Malignant neoplasm of rectum: Secondary | ICD-10-CM | POA: Diagnosis not present

## 2017-08-19 DIAGNOSIS — I2510 Atherosclerotic heart disease of native coronary artery without angina pectoris: Secondary | ICD-10-CM | POA: Diagnosis not present

## 2017-08-19 DIAGNOSIS — R911 Solitary pulmonary nodule: Secondary | ICD-10-CM | POA: Diagnosis not present

## 2017-08-19 MED ORDER — IOPAMIDOL (ISOVUE-300) INJECTION 61%
75.0000 mL | Freq: Once | INTRAVENOUS | Status: AC | PRN
  Administered 2017-08-19: 75 mL via INTRAVENOUS

## 2017-08-20 ENCOUNTER — Other Ambulatory Visit: Payer: Self-pay | Admitting: *Deleted

## 2017-08-20 DIAGNOSIS — C787 Secondary malignant neoplasm of liver and intrahepatic bile duct: Secondary | ICD-10-CM

## 2017-08-20 DIAGNOSIS — C20 Malignant neoplasm of rectum: Secondary | ICD-10-CM

## 2017-08-20 MED ORDER — HYDROCODONE-ACETAMINOPHEN 7.5-300 MG PO TABS: 1.0000 | ORAL_TABLET | Freq: Four times a day (QID) | ORAL | 0 refills | Status: DC | PRN

## 2017-08-21 ENCOUNTER — Ambulatory Visit: Payer: Managed Care, Other (non HMO) | Admitting: Hematology and Oncology

## 2017-08-21 ENCOUNTER — Ambulatory Visit: Payer: Managed Care, Other (non HMO)

## 2017-08-25 ENCOUNTER — Inpatient Hospital Stay (HOSPITAL_BASED_OUTPATIENT_CLINIC_OR_DEPARTMENT_OTHER): Payer: Managed Care, Other (non HMO) | Admitting: Hematology and Oncology

## 2017-08-25 VITALS — BP 153/90 | HR 80 | Temp 96.4°F | Resp 20 | Wt 187.2 lb

## 2017-08-25 DIAGNOSIS — G629 Polyneuropathy, unspecified: Secondary | ICD-10-CM

## 2017-08-25 DIAGNOSIS — C7802 Secondary malignant neoplasm of left lung: Secondary | ICD-10-CM

## 2017-08-25 DIAGNOSIS — Z8701 Personal history of pneumonia (recurrent): Secondary | ICD-10-CM | POA: Diagnosis not present

## 2017-08-25 DIAGNOSIS — C787 Secondary malignant neoplasm of liver and intrahepatic bile duct: Secondary | ICD-10-CM

## 2017-08-25 DIAGNOSIS — Z801 Family history of malignant neoplasm of trachea, bronchus and lung: Secondary | ICD-10-CM | POA: Diagnosis not present

## 2017-08-25 DIAGNOSIS — Z809 Family history of malignant neoplasm, unspecified: Secondary | ICD-10-CM | POA: Diagnosis not present

## 2017-08-25 DIAGNOSIS — R197 Diarrhea, unspecified: Secondary | ICD-10-CM | POA: Diagnosis not present

## 2017-08-25 DIAGNOSIS — M545 Low back pain: Secondary | ICD-10-CM

## 2017-08-25 DIAGNOSIS — C20 Malignant neoplasm of rectum: Secondary | ICD-10-CM

## 2017-08-25 DIAGNOSIS — Z923 Personal history of irradiation: Secondary | ICD-10-CM

## 2017-08-25 DIAGNOSIS — I10 Essential (primary) hypertension: Secondary | ICD-10-CM

## 2017-08-25 DIAGNOSIS — Z7189 Other specified counseling: Secondary | ICD-10-CM

## 2017-08-25 DIAGNOSIS — Z79899 Other long term (current) drug therapy: Secondary | ICD-10-CM | POA: Diagnosis not present

## 2017-08-25 DIAGNOSIS — F17210 Nicotine dependence, cigarettes, uncomplicated: Secondary | ICD-10-CM

## 2017-08-25 NOTE — Progress Notes (Addendum)
[facility] day:  08/25/2017   Chief Complaint: Tamara Hall is a 61 y.o. female with stage IV rectal cancer who is seen for review of interval chest CT and assessment post neoadjuvant Xeloda and radiation.  HPI:  The patient was last seen in the medical oncology clinic on 08/12/2017.  At that time, she felt well.  Her diarrhea had resolved.   She was eating and drinking normally. Exam was stable. She continued to smoke.  Potassium was 3.8.    She completed radiation and Xeloda on 08/15/2017.  Chest CT with contrast on 08/19/2017 revealed left lower lobe 6 mm solid pulmonary nodule, decreased since 01/15/2017 chest CT. Stable 7 mm right lower lobe pulmonary nodule.  There was no new or progressive metastatic disease in the chest.  The low-attenuation ablation defect in the superior left liver lobe, was incompletely evaluated.   During the interim, patient has been doing well. She denies any physical concerns. She has not had any recurrent diarrhea. Patient continues to smoke. She has not experienced any B symptoms or interval infections. Patient has scheduled imaging [facility] on 09/04/2017. She is scheduled to follow up with Dr. Cecil Cobbs on 09/12/2017.   Past Medical History:  Diagnosis Date  . Cancer ([place])   . Hypertension   . Pneumonia    20+ years ago    Past Surgical History:  Procedure Laterality Date  . ABDOMINAL HYSTERECTOMY    . BREAST SURGERY    . COLONOSCOPY WITH PROPOFOL N/A 01/14/2017   Performed by Lucilla Lame, MD at [facility]  . ELBOW SURGERY Right    Has had 4 surgeries on right elbow.  Marland Kitchen HAND SURGERY Right 2008  . JOINT REPLACEMENT    . Porta Cath Insertion N/A 01/29/2017   Performed by Algernon Huxley, MD at [facility]  . TONSILLECTOMY      Family History  Problem Relation Age of Onset  . Lung cancer Mother   . Lung cancer Father   . Brain cancer Maternal Grandmother     Social History:  reports that  she has been smoking cigarettes.  She has a 80.00 pack-year smoking history. she has never used smokeless tobacco. She reports that she does not drink alcohol or use drugs.  She is smoking 1/2 packs/day.  She lives in [place].  She has 3 children (ages 109, 45, and 65).  She smokes 2 packs a day.  She started smoking at age 66.  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  [name] is her significant other.  She is alone today.  Allergies: No Known Allergies  Current Medications: Current Outpatient Medications  Medication Sig Dispense Refill  . albuterol (PROVENTIL HFA;VENTOLIN HFA) 108 (90 Base) MCG/ACT inhaler Inhale 2 puffs into the lungs every 6 (six) hours as needed for wheezing or shortness of breath. 1 Inhaler 2  . calcium-vitamin D (OSCAL WITH D) 500-200 MG-UNIT tablet Take 1 tablet by mouth daily with breakfast.    . capecitabine (XELODA) 150 MG tablet Take one 150 mg pill twice a day for 5 days each week during radiation with three 500 mg tablets 50 tablet 0  . capecitabine (XELODA) 500 MG tablet Take three  500 mg pills twice a day for 5 days each week during radiation with one 150 mg tablet 150 tablet 0  . Hydrocodone-Acetaminophen 7.5-300 MG TABS Take 1 tablet  every 6 (six) hours as needed by mouth (pain). 60 each 0  . lidocaine-prilocaine (EMLA) cream APPLY A SMALL AMOUNT TO PORT SITE AT LEAST 1 HOUR BEFORE BEING ACCESSED. COVER IN PLASTIC WRAP. 30 g 1  . LORazepam (ATIVAN) 0.5 MG tablet Take 1 tablet (0.5 mg total) by mouth every 6 (six) hours as needed. For nausea or vomiting 30 tablet 1  . Magnesium Hydroxide (MILK OF MAGNESIA PO) Take 30 mLs by mouth at bedtime.     Marland Kitchen omeprazole (PRILOSEC) 20 MG capsule TAKE 1 CAPSULE BY MOUTH EVERY DAY 30 capsule 3  . ondansetron (ZOFRAN) 8 MG tablet Take 1 tablet (8 mg total) by mouth every 8 (eight) hours as needed. 30 tablet 2  . potassium chloride SA (K-DUR,KLOR-CON) 20 MEQ tablet Take 1 tablet  (20 mEq total) by mouth daily. 20 tablet 0   No current facility-administered medications for this visit.     Review of Systems:  GENERAL:  Feels "good".  No fevers or sweats.  Weight down 1 pound PERFORMANCE STATUS (ECOG):  1 HEENT:  No visual changes, runny nose, sore throat, mouth sores or tenderness. Lungs:  No shortness of breath. No cough.  No hemoptysis. Cardiac:  No chest pain, palpitations, orthopnea, or PND.  Elevated BP due to anxiety. GI:  Eating well.  No nausea, vomiting, diarrhea, constipation or melena.  No rectal bleeding. GU:  No urgency, frequency, dysuria, or hematuria.  External irritation secondary to radiation. Musculoskeletal:  Little back pain.  No joint pain.  No muscle tenderness. Extremities:  No pain or swelling. Skin:  No rashes or skin changes. Neuro:  No headache, numbness or weakness, balance or coordination issues. Endocrine:  No diabetes, thyroid issues, hot flashes or night sweats. Psych:  Stress/anxiety.  No depression. Pain: No back pain. Review of systems:  All other systems reviewed and found to be negative.  Physical Exam: Blood pressure (!) 153/90, pulse 80, temperature (!) 96.4 F (35.8 C), temperature source Tympanic, resp. rate 20, weight 187 lb 4 oz (84.9 kg). GENERAL:  Well developed, well nourished, woman sitting comfortably in the oncology clinic in no acute distress.  She smells of smoke. MENTAL STATUS:  Alert and oriented to person, place and time. HEAD:Short blonde graying hair. Normocephalic, atraumatic, face symmetric, no Cushingoid features. EYES:Blueeyes. No conjunctivitis or scleral icterus. NEUROLOGICAL: Unremarkable. PSYCH: Appropriate.   No visits with results within 3 Day(s) from this visit.  Latest known visit with results is:  Appointment on 08/12/2017  Component Date Value Ref Range Status  . Sodium 08/12/2017 134* 135 - 145 mmol/L Final  . Potassium 08/12/2017 3.8  3.5 - 5.1 mmol/L Final  . Chloride  08/12/2017 102  101 - 111 mmol/L Final  . CO2 08/12/2017 25  22 - 32 mmol/L Final  . Glucose, Bld 08/12/2017 144* 65 - 99 mg/dL Final  . BUN 08/12/2017 11  6 - 20 mg/dL Final  . Creatinine, Ser 08/12/2017 0.74  0.44 - 1.00 mg/dL Final  . Calcium 08/12/2017 9.2  8.9 - 10.3 mg/dL Final  . Total Protein 08/12/2017 6.8  6.5 - 8.1 g/dL Final  . Albumin 08/12/2017 3.8  3.5 - 5.0 g/dL Final  . AST 08/12/2017 19  15 - 41 U/L Final  . ALT 08/12/2017 13* 14 - 54 U/L Final  . Alkaline Phosphatase 08/12/2017 80  38 - 126 U/L Final  . Total Bilirubin 08/12/2017 0.3  0.3 - 1.2 mg/dL Final  . GFR calc non Af Amer 08/12/2017 >  60  >60 mL/min Final  . GFR calc Af Amer 08/12/2017 >60  >60 mL/min Final   Comment: (NOTE) The eGFR has been calculated using the CKD EPI equation. This calculation has not been validated in all clinical situations. eGFR's persistently <60 mL/min signify possible Chronic Kidney Disease.   . Anion gap 08/12/2017 7  5 - 15 Final  . WBC 08/12/2017 4.0  3.6 - 11.0 K/uL Final  . RBC 08/12/2017 4.10  3.80 - 5.20 MIL/uL Final  . Hemoglobin 08/12/2017 13.5  12.0 - 16.0 g/dL Final  . HCT 08/12/2017 39.3  35.0 - 47.0 % Final  . MCV 08/12/2017 95.9  80.0 - 100.0 fL Final  . MCH 08/12/2017 33.0  26.0 - 34.0 pg Final  . MCHC 08/12/2017 34.4  32.0 - 36.0 g/dL Final  . RDW 08/12/2017 16.7* 11.5 - 14.5 % Final  . Platelets 08/12/2017 226  150 - 440 K/uL Final  . Neutrophils Relative % 08/12/2017 78  % Final  . Neutro Abs 08/12/2017 3.2  1.4 - 6.5 K/uL Final  . Lymphocytes Relative 08/12/2017 12  % Final  . Lymphs Abs 08/12/2017 0.5* 1.0 - 3.6 K/uL Final  . Monocytes Relative 08/12/2017 7  % Final  . Monocytes Absolute 08/12/2017 0.3  0.2 - 0.9 K/uL Final  . Eosinophils Relative 08/12/2017 3  % Final  . Eosinophils Absolute 08/12/2017 0.1  0 - 0.7 K/uL Final  . Basophils Relative 08/12/2017 0  % Final  . Basophils Absolute 08/12/2017 0.0  0 - 0.1 K/uL Final    Assessment:  Tamara Hall is a 61 y.o. female with clinical stage T3N1M1 rectal cancer.   She presented with a 9 month history of rectal bleeding.  Colonoscopy on 01/14/2017 revealed a frond-like/villous non-obstructing large mass in the rectum. The mass was non-circumferential.  Biopsies revealed high grade dysplasia within an adenoma (? sampling error).  CEA was 31.6 on 01/15/2017.  Abdomen and pelvic CT on 01/13/2017 revealed irregular thickening of the rectum suspicious for carcinoma.  There was a 1.6 cm hypoattenuating lesion in the right hepatic lobe worrisome for metastatic disease.  Chest CT on 01/15/2017 revealed a 9 mm noncalcified left lower lobe pulmonary nodule (new).  Pelvic MRI on 01/16/2017 revealed rectal adenocarcinoma T3N1.  There was extension beyond the muscularis propria (17 mm).  There were mesorectal nodes >= 5 mm.  There was adenopathy on the left side of the mesorectum, including a 9 mm lesion.  The distance from the tumor to the anal sphincter was 8 mm.  PET scan on 01/27/2017 revealed intense metabolic activity associated rectal mass consists with primary rectal carcinoma.  There was a hypermetabolic metastatic lesion to the central LEFT hepatic lobe.  There was metabolic activity associated with small LEFT lower lobe nodule which was most consistent with pulmonary metastasis.  Liver MRI on 01/31/2017 revealed a 2.6 x 2.7 x 2.1 cm lesion in segment VIII of the liver near the junction with segment IVa c/w a metastatic lesion.  There was a 1.1 x 0.8 cm suspected metastatic lesion peripherally in segment VI of the liver.  CT guided liver biopsy at [facility] on 02/07/2017 revealed malignant cells c/w metastatic colorectal adenocarcinoma.  Tumor was sent for  MMR/MSI, KRAS/NRAS, and BRAF.  She is scheduled to have liver directed therapy (ablation) at [facility] on 03/19/2017.  CEA has been followed: 31.6 on 01/15/2017, 38.1 on 02/18/2017, 25.1 on 03/18/2017, 11.8 on 04/08/2017, 9.2 on 04/22/2017, 9.5 on 05/20/2017,  and 9.6  on 06/19/2017.  She is s/p 2 cycle of FOLFOX  (02/18/2017 - 03/04/2017).  She developed transient cold induced neuropathy.  She developed wheezing and hypoxia with cycle #2 felt secondary to oxaliplatin.  She was treated with oxygen by face mask, Benadryl, hydrocortisone, and nebulized albuterol.    She is s/p 4 cycles of FOLFIRI (03/24/2017 - 04/22/2017) with Neulasta support.  Diarrhea was controlled with Imodium.  Abdomen and pelvic CT on 05/16/2017 revealed persistent but decreased asymmetric wall thickening in the rectum.  The medial segment left liver lesion was smaller (2.7 x 2.6 cm to 1.9 x 1.6 cm). A tiny lesion seen in segment VI of the liver on previous MRI was not evident on today's CT scan.  The left lower lobe pulmonary nodule was smaller (9 mm to 6 mm).  There was no new or progressive findings in the abdomen and pelvis.  She underwent CT guided microwave ablation of the liver lesion on 06/06/2017.    She began radiation on 06/18/2017.  She receives daily Xeloda with radiation. She missed treatment on 06/23/2017 and 06/26/2017.  She developed grade III diarrhea.  Radiation has been on hold since 07/07/2017.  She received radiation from 07/15/2017 - 07/17/2017.  Xeloda was decreased to 1150 mg BID.  Radiation and Xeloda completed on 08/15/2017.  Chest CT with contrast on 08/19/2017 revealed left lower lobe 6 mm solid pulmonary nodule, decreased since 01/15/2017 chest CT. Stable 7 mm right lower lobe pulmonary nodule.  There was no new or progressive metastatic disease in the chest.  The low-attenuation ablation defect in the superior left liver lobe, was incompletely evaluated.  She has an 80 pack year smoking history.  She is smoking.  She was treated for bronchitis and reactive airway disease on 06/30/2017.  She completed azithromycin and a prednisone pulse.  Symptomatically, she feels well.  Her diarrhea has resolved.  Exam is stable.    Plan: 1.  Review interim chest CT-  6 mm LLL pulmonary nodule decreased and 7 mm RLL pulmonary nodule stable. 2.  Follow up with [facility] as previously scheduled. Plan of care reviewed. Imaging scheduled on 09/04/2017. Patient is scheduled to follow up with Dr. Lanelle Bal on 09/12/2017 to discuss surgical intervention.  3.  RTC after surgery. Patient will need to call for appointment.    Honor Loh, NP  08/25/2017, 3:25 PM   I saw and evaluated the patient, participating in the key portions of the service and reviewing pertinent diagnostic studies and records. I reviewed the nurse practitioner's note and agree with the findings and the plan.  The assessment and plan were discussed with the patient.  A few questions were asked by the patient and answered.   Lequita Asal, MD 08/25/2017, 3:25 PM

## 2017-08-25 NOTE — Progress Notes (Signed)
Patient here today for CT results. 

## 2017-08-28 ENCOUNTER — Encounter: Payer: Self-pay | Admitting: Hematology and Oncology

## 2017-09-03 ENCOUNTER — Inpatient Hospital Stay: Payer: Managed Care, Other (non HMO)

## 2017-09-03 DIAGNOSIS — C20 Malignant neoplasm of rectum: Secondary | ICD-10-CM | POA: Diagnosis not present

## 2017-09-03 MED ORDER — HEPARIN SOD (PORK) LOCK FLUSH 100 UNIT/ML IV SOLN
500.0000 [IU] | INTRAVENOUS | Status: AC | PRN
  Administered 2017-09-03: 500 [IU]

## 2017-09-03 MED ORDER — SODIUM CHLORIDE 0.9% FLUSH
10.0000 mL | INTRAVENOUS | Status: AC | PRN
  Administered 2017-09-03: 10 mL
  Filled 2017-09-03: qty 10

## 2017-09-22 ENCOUNTER — Ambulatory Visit: Payer: Managed Care, Other (non HMO) | Admitting: Radiation Oncology

## 2017-09-22 ENCOUNTER — Telehealth: Payer: Self-pay | Admitting: Hematology and Oncology

## 2017-09-22 NOTE — Telephone Encounter (Signed)
Port Flush added every 6 weeks, beginning 10/20/16, per Rosa/schd msg. Appts conf with patient.

## 2017-10-07 HISTORY — PX: LIVER BIOPSY: SHX301

## 2017-10-07 HISTORY — PX: COLON SURGERY: SHX602

## 2017-10-07 HISTORY — PX: OTHER SURGICAL HISTORY: SHX169

## 2017-10-20 ENCOUNTER — Inpatient Hospital Stay: Payer: Self-pay | Attending: Hematology and Oncology

## 2017-10-20 ENCOUNTER — Ambulatory Visit: Payer: Self-pay | Attending: Radiation Oncology | Admitting: Radiation Oncology

## 2018-01-06 ENCOUNTER — Ambulatory Visit: Payer: Self-pay

## 2018-01-12 ENCOUNTER — Inpatient Hospital Stay: Payer: Medicaid Other | Attending: Hematology and Oncology

## 2018-01-12 DIAGNOSIS — C787 Secondary malignant neoplasm of liver and intrahepatic bile duct: Secondary | ICD-10-CM | POA: Insufficient documentation

## 2018-01-12 DIAGNOSIS — Z933 Colostomy status: Secondary | ICD-10-CM | POA: Insufficient documentation

## 2018-01-12 DIAGNOSIS — Z720 Tobacco use: Secondary | ICD-10-CM | POA: Insufficient documentation

## 2018-01-12 DIAGNOSIS — J984 Other disorders of lung: Secondary | ICD-10-CM | POA: Insufficient documentation

## 2018-01-12 DIAGNOSIS — C20 Malignant neoplasm of rectum: Secondary | ICD-10-CM | POA: Insufficient documentation

## 2018-01-12 DIAGNOSIS — R109 Unspecified abdominal pain: Secondary | ICD-10-CM | POA: Insufficient documentation

## 2018-01-22 ENCOUNTER — Other Ambulatory Visit: Payer: Self-pay | Admitting: Urgent Care

## 2018-01-22 ENCOUNTER — Telehealth: Payer: Self-pay | Admitting: *Deleted

## 2018-01-22 DIAGNOSIS — C7802 Secondary malignant neoplasm of left lung: Secondary | ICD-10-CM

## 2018-01-22 DIAGNOSIS — C787 Secondary malignant neoplasm of liver and intrahepatic bile duct: Secondary | ICD-10-CM

## 2018-01-22 DIAGNOSIS — R198 Other specified symptoms and signs involving the digestive system and abdomen: Secondary | ICD-10-CM

## 2018-01-22 DIAGNOSIS — C20 Malignant neoplasm of rectum: Secondary | ICD-10-CM

## 2018-01-22 DIAGNOSIS — E876 Hypokalemia: Secondary | ICD-10-CM

## 2018-01-22 NOTE — Telephone Encounter (Signed)
Patient called and stated that her Doctor from [facility] said that it was time for her to be seen by Dr.Corcoran,  because she was told that it was time her to have a scan.  However, she was unsure of what type of scan she needed to have. Patient hasn't been seen here since 08/25/17. She asked if someone could please give her a call @ [phone].

## 2018-01-23 ENCOUNTER — Telehealth: Payer: Self-pay | Admitting: *Deleted

## 2018-01-23 NOTE — Telephone Encounter (Signed)
Per Gaspar Bidding to schedule Lab/MD after PET scan. All appts were scheduled as requested. Patient is aware of Location, Time and Dates of her appts.

## 2018-01-27 ENCOUNTER — Encounter: Admission: RE | Admit: 2018-01-27 | Payer: Medicaid Other | Source: Ambulatory Visit

## 2018-01-30 ENCOUNTER — Other Ambulatory Visit: Payer: Self-pay | Admitting: *Deleted

## 2018-01-30 DIAGNOSIS — C20 Malignant neoplasm of rectum: Secondary | ICD-10-CM

## 2018-02-01 NOTE — Progress Notes (Signed)
[facility] day:  02/02/2018   Chief Complaint: Tamara Hall is a 62 y.o. 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   HPI:  The patient was last seen in the medical oncology clinic on 08/25/2017.  At that time, she was doing well. Diarrhea was controlled. Exam was stable. She was scheduled for follow up imaging and evaluation with Dr. Cecil Cobbs at [facility].   Patient underwent MRI of her pelvis at [facility] on 09/03/2017 that demonstrated a partial response of the primary tumor and extramural disease. Post treatment category ymrT T3b. There was an overall decrease in size in the previously seen perirectal node. Hypointense changes at the site of prior tumor focally contact buts does not definitely invade the LEFT levator ani, which favors treated disease over viable tumor invading the pelvic floor musculature. Tumor contacts, but does not definitely invade the internal anal sphincter. No evidence of residual or recurrent hepatic disease following microwave ablation.   She was seen in follow up consult on 09/11/2017 by Dr. Rolla Tamara to review imaging and discuss treatment options. APR with permanent colostomy was recommended, however patient declined. Dr. Cecil Cobbs advised patient that this was her best option for a cure, however patient continued to decline citing that she wanted time to consider her options.  Patient was seen again on 10/02/2017 by Dr. Cecil Cobbs. APR with permanent colostomy was again recommended to gain local control of patient's tumor. Goals of treatment discussed at length, and patient reminded that the main focus was a curative intent. Patient ultimately decided to proceed with the recommended procedure. Evaluation by CVTS was also recommended for further evaluation of a wedge resection of the patient's  known LEFT lung lesion.    Patient was seen in consult on 10/14/2017 by Dr. Waldemar Dickens (CVTS) for surgical opinion regarding known lung lesion. Discussed VATS wedge resection for metastatic rectal cancer vs. LEFT lower lobectomy if found to be a new lung primary. Repeat CT imaging of the chest and PFTs were recommended.   CT imaging of the chest without contrast on 10/14/2017 that demonstrated a decrease in overall size of LEFT lung lesion. A 6 mm (previously 8 mm) LLL pulmonary nodule was described. There was no other evidence of metastasis within the chest.   Patient seen in consult on 10/16/2017 by Dr. Roetta Sessions (palliative care). Discussed pain management, anxiety, and depression. Patient was started on venlafaxine XR 75 mg daily for neuropathic pain, and tizanidine 2 mg qhs for muscle spasms. Dr. Carman Ching elected to continue the prescribed Norco 7.5 mg QID PRN. Palliative care to continue to follow. She was also referred to counselor for discuss narcotic and nicotine dependence.   On 10/24/2017, patient underwent the planned APR with descending colostomy and VRAM flap. She underwent a VATS wedge resection to her LEFT lung. 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 Final diagnosis was pT3N0M1a rectal adenocarcinoma with metastasis to left lower lobe of the lung. Chest tube was removed POD-2. Blood counts dropped on POD-3, requiring the transfusion of 1 unit of PRBCs. She developed an ileus, however refused NGT.  Ileus persisted until POD-8, at which time she regained bowel function. JP drains removed POD-11 (11/04/2017) and patient was discharged home.    Patient was seen in post-operative consult by Dr. Michel Harrow on  11/13/2017. She was doing well overall. Patient dealing with increased anxiety and unmanaged pain. She was ordered to be seen by palliative care that day for pain management  recommendations. Dr. Lillia Dallas restarted patient on Cymbalta 30 mg daily. Gabapentin was increased to 400 mg TID x 1 week, then patient was to increase to 500 mg TID. She was restarted on Norco 7.5/300 mg q6h PRN.   Patient was seen again on 11/24/2017 by Dr. Cecil Cobbs. There were concerns related to her ostomy. Patient was having fevers and increased pain. She was given topical lidocaine and a 3 day follow up visit. On 11/2017, patient was not improving. CT scan was ordered due to concerns for ostomy ischemia vs. infection. CT imaging demonstrated postsurgical changes from APR. There was focal wall thickening and stranding of the stoma, a nonspecific finding that could potentially be related to ischemia. There was a rim-enhancing fluid collection in the anterior abdominal wall deep to the laparotomy incision containing gas that was suspicious for abscess. A second rim-enhancing fluid collection in the presacral space also likely represented a small abscess. She was admitted for ostomy revision and further surgical evaluation.   Patient underwent surgical ostomy revision on 11/28/2017.  Pathology showed focal mucosal erosion and fat necrosis c/w ischemia. There was extensive underlying fat necrosis. No dysplasia or carcinoma was identified. Colostomy site was relocated. Patient developed a post-operative peri-incisional abscess on POD-8, which was treated with oral Augmentin BID x 7 days. Wound packed BID while admitted, and continued at time of discharge. She was discharged home on POD-11 with plans for outpatient follow up with surgical oncology.   Post-operative follow up following ostomy revision and relocation was on 12/11/2017. Mid-abdominal wound was not healing as expected. Patient had not been packing wound as advised. She was re-educated on packing and advised to continue BID until seen for 2 week follow up. Followed up on 12/18/2017 with Dr. Cecil Cobbs. Patient was improving overall. Wounds were  healing. Patient continued to have poorly controlled pain. She refused to see palliative care anymore citing that they "hurt her feelings" by always thinking she was depressed and that she shouldn't be in pain. She was encouraged to follow up and patient, while reluctant, ultimately agreed. Dr. Lillia Dallas saw patient on 12/25/2017 and provided a 2 week prescription for Roxicodone IR 5 mg q4 PRN. Gabapentin was increased to 600 mg TID x 1 week, then patient to increase to 900 mg TID.   Patient developed a second SSTI on 01/07/2018. She once again was having fevers and copious amounts of green drainage from her wounds. She was seen in follow up on 01/08/2018 by Binnie Rail, FNP at which time she was prescribed Bactrim DS BID x 10 days. She was encouraged to continue to pack her wounds BID. Palliative care saw patient and recommended tapering narcotics. Venlafaxine dose was increased, however patient notes that she has discontinued it because of side effects (headaches).   Wound packing was discontinued on 01/29/2018. Patient to follow up with Dr. Cecil Cobbs in June or July.   Patient was referred to local pain management clinic on 01/27/2018.   In the interim, patient continues to have significant pain. She states, "It is just like labor pains some times. Sometimes by pain is a level 15". Pain rated 4/10 in the clinic today. She denies B symptoms. Patient is eating well. Weight is down 11 pounds.   Patient stopped smoking on 10/24/2017.   Past Medical History:  Diagnosis Date  . Cancer ([place])   .  Hypertension   . Pneumonia    20+ years ago    Past Surgical History:  Procedure Laterality Date  . ABDOMINAL HYSTERECTOMY    . BREAST SURGERY    . COLONOSCOPY WITH PROPOFOL N/A 01/14/2017   Procedure: COLONOSCOPY WITH PROPOFOL;  Surgeon: Lucilla Lame, MD;  Location: [facility] ENDOSCOPY;  Service: Endoscopy;  Laterality: N/A;  . ELBOW SURGERY Right    Has had 4 surgeries on right elbow.  Marland Kitchen HAND SURGERY  Right 2008  . JOINT REPLACEMENT    . PORTA CATH INSERTION N/A 01/29/2017   Procedure: Glori Luis Cath Insertion;  Surgeon: Algernon Huxley, MD;  Location: [place] [facility];  Service: Cardiovascular;  Laterality: N/A;  . TONSILLECTOMY      Family History  Problem Relation Age of Onset  . Lung cancer Mother   . Lung cancer Father   . Brain cancer Maternal Grandmother     Social History:  reports that she has been smoking cigarettes.  She has a 80.00 pack-year smoking history. She has never used smokeless tobacco. She reports that she does not drink alcohol or use drugs.  She was smoking 1/2 packs/day.  She stopped smoking 10/24/2017.  She lives in [place].  She has 3 children (ages 37, 54, and 28).  She smoked 2 packs a day.  She started smoking at age 58. Stopped smoking on 10/24/2017.  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  [name] is her significant other.  She is alone today.  Allergies: No Known Allergies  Current Medications: Current Outpatient Medications  Medication Sig Dispense Refill  . calcium-vitamin D (OSCAL WITH D) 500-200 MG-UNIT tablet Take 1 tablet by mouth daily with breakfast.    . LORazepam (ATIVAN) 0.5 MG tablet Take 1 tablet (0.5 mg total) by mouth every 6 (six) hours as needed. For nausea or vomiting 30 tablet 1  . omeprazole (PRILOSEC) 20 MG capsule TAKE 1 CAPSULE BY MOUTH EVERY DAY 30 capsule 3  . ondansetron (ZOFRAN) 8 MG tablet Take 1 tablet (8 mg total) by mouth every 8 (eight) hours as needed. 30 tablet 2  . albuterol (PROVENTIL HFA;VENTOLIN HFA) 108 (90 Base) MCG/ACT inhaler Inhale 2 puffs into the lungs every 6 (six) hours as needed for wheezing or shortness of breath. (Patient not taking: Reported on 02/02/2018) 1 Inhaler 2  . capecitabine (XELODA) 150 MG tablet Take one 150 mg pill twice a day for 5 days each week during radiation with three 500 mg tablets (Patient not taking: Reported on 02/02/2018) 50 tablet 0   . capecitabine (XELODA) 500 MG tablet Take three  500 mg pills twice a day for 5 days each week during radiation with one 150 mg tablet (Patient not taking: Reported on 02/02/2018) 150 tablet 0  . Hydrocodone-Acetaminophen 7.5-300 MG TABS Take 1 tablet every 6 (six) hours as needed by mouth (pain). (Patient not taking: Reported on 02/02/2018) 60 each 0  . lidocaine-prilocaine (EMLA) cream APPLY A SMALL AMOUNT TO PORT SITE AT LEAST 1 HOUR BEFORE BEING ACCESSED. COVER IN PLASTIC WRAP. (Patient not taking: Reported on 02/02/2018) 30 g 1  . Magnesium Hydroxide (MILK OF MAGNESIA PO) Take 30 mLs by mouth at bedtime.     . potassium chloride SA (K-DUR,KLOR-CON) 20 MEQ tablet Take 1 tablet (20 mEq total) by mouth daily. (Patient not taking: Reported on 02/02/2018) 20 tablet 0   No current facility-administered medications for this visit.  Review of Systems:  GENERAL:  Feels "ok".  Chronic pain issues.  No fevers, sweats.  Weight down 11 pounds since 08/25/2018. PERFORMANCE STATUS (ECOG):  2 HEENT:  No visual changes, runny nose, sore throat, mouth sores or tenderness. Lungs: No shortness of breath or cough.  No hemoptysis. Cardiac:  No chest pain, palpitations, orthopnea, or PND. GI:  Eating well.  No nausea, vomiting, diarrhea, constipation, melena or hematochezia. GU:  No urgency, frequency, dysuria, or hematuria. Musculoskeletal:  No back pain.  No joint pain.  No muscle tenderness. Extremities:  No pain or swelling. Skin:  No rashes or skin changes. Neuro:  No headache, numbness or weakness, balance or coordination issues. Endocrine:  No diabetes, thyroid issues, hot flashes or night sweats. Psych:  No mood changes, depression or anxiety. Pain:  Abdominal pain, post-op (4 out of 10). Review of systems:  All other systems reviewed and found to be negative.   Physical Exam: Blood pressure 120/68, pulse 84, temperature (!) 97.3 F (36.3 C), temperature source Tympanic, resp. rate 18, height 5'  7" (1.702 m), weight 176 lb 12.8 oz (80.2 kg), SpO2 98 %. GENERAL:  Well developed, well nourished, woman sitting comfortably in the exam room in no acute distress. MENTAL STATUS:  Alert and oriented to person, place and time. HEAD:  Short blonde graying hair.  Normocephalic, atraumatic, face symmetric, no Cushingoid features. EYES:  Blue eyes.  Pupils equal round and reactive to light and accomodation.  No conjunctivitis or scleral icterus. ENT:  Oropharynx clear without lesion.  Tongue normal. Mucous membranes moist.  RESPIRATORY:  Clear to auscultation without rales, wheezes or rhonchi. CARDIOVASCULAR:  Regular rate and rhythm without murmur, rub or gallop. ABDOMEN:  Soft, tender to palpation upper quadrant near incision without guarding or rebound tenderness. Active bowel sounds and no hepatosplenomegaly.  No masses.  Left sided ostomy. SKIN:  Well healed abdominal incisions.  No erythema, increased warm or exudate.  No rashes, ulcers or lesions. EXTREMITIES: No edema, no skin discoloration or tenderness.  No palpable cords. LYMPH NODES: No palpable cervical, supraclavicular, axillary or inguinal adenopathy  NEUROLOGICAL: Unremarkable. PSYCH:  Appropriate.  Tearful at times.   Appointment on 02/02/2018  Component Date Value Ref Range Status  . Sodium 02/02/2018 138  135 - 145 mmol/L Final  . Potassium 02/02/2018 3.8  3.5 - 5.1 mmol/L Final  . Chloride 02/02/2018 104  101 - 111 mmol/L Final  . CO2 02/02/2018 24  22 - 32 mmol/L Final  . Glucose, Bld 02/02/2018 119* 65 - 99 mg/dL Final  . BUN 02/02/2018 9  6 - 20 mg/dL Final  . Creatinine, Ser 02/02/2018 0.70  0.44 - 1.00 mg/dL Final  . Calcium 02/02/2018 8.9  8.9 - 10.3 mg/dL Final  . Total Protein 02/02/2018 6.8  6.5 - 8.1 g/dL Final  . Albumin 02/02/2018 3.6  3.5 - 5.0 g/dL Final  . AST 02/02/2018 18  15 - 41 U/L Final  . ALT 02/02/2018 10* 14 - 54 U/L Final  . Alkaline Phosphatase 02/02/2018 92  38 - 126 U/L Final  . Total  Bilirubin 02/02/2018 0.3  0.3 - 1.2 mg/dL Final  . GFR calc non Af Amer 02/02/2018 >60  >60 mL/min Final  . GFR calc Af Amer 02/02/2018 >60  >60 mL/min Final   Comment: (NOTE) The eGFR has been calculated using the CKD EPI equation. This calculation has not been validated in all clinical situations. eGFR's persistently <60 mL/min signify possible Chronic Kidney Disease.   Marland Kitchen  Anion gap 02/02/2018 10  5 - 15 Final   Performed at [facility], [address] [place], [place] [address]  . WBC 02/02/2018 4.6  3.6 - 11.0 K/uL Final  . RBC 02/02/2018 3.90  3.80 - 5.20 MIL/uL Final  . Hemoglobin 02/02/2018 10.3* 12.0 - 16.0 g/dL Final  . HCT 02/02/2018 31.6* 35.0 - 47.0 % Final  . MCV 02/02/2018 81.0  80.0 - 100.0 fL Final  . MCH 02/02/2018 26.6  26.0 - 34.0 pg Final  . MCHC 02/02/2018 32.8  32.0 - 36.0 g/dL Final  . RDW 02/02/2018 17.6* 11.5 - 14.5 % Final  . Platelets 02/02/2018 319  150 - 440 K/uL Final  . Neutrophils Relative % 02/02/2018 71  % Final  . Neutro Abs 02/02/2018 3.2  1.4 - 6.5 K/uL Final  . Lymphocytes Relative 02/02/2018 19  % Final  . Lymphs Abs 02/02/2018 0.9* 1.0 - 3.6 K/uL Final  . Monocytes Relative 02/02/2018 8  % Final  . Monocytes Absolute 02/02/2018 0.4  0.2 - 0.9 K/uL Final  . Eosinophils Relative 02/02/2018 2  % Final  . Eosinophils Absolute 02/02/2018 0.1  0 - 0.7 K/uL Final  . Basophils Relative 02/02/2018 0  % Final  . Basophils Absolute 02/02/2018 0.0  0 - 0.1 K/uL Final   Performed at [facility], [address], [place], [place] [address]    Assessment:  Tamara Hall is a 62 y.o. female with clinical stage T3N1M1 rectal cancer.   She presented with a 9 month history of rectal bleeding.  Colonoscopy on 01/14/2017 revealed a frond-like/villous non-obstructing large mass in the rectum. The mass was non-circumferential.  Biopsies revealed high grade dysplasia within an adenoma (? sampling error).  CEA was 31.6 on 01/15/2017.  Abdomen and  pelvic CT on 01/13/2017 revealed irregular thickening of the rectum suspicious for carcinoma.  There was a 1.6 cm hypoattenuating lesion in the right hepatic lobe worrisome for metastatic disease.  Chest CT on 01/15/2017 revealed a 9 mm noncalcified left lower lobe pulmonary nodule (new).  Pelvic MRI on 01/16/2017 revealed rectal adenocarcinoma T3N1.  There was extension beyond the muscularis propria (17 mm).  There were mesorectal nodes >= 5 mm.  There was adenopathy on the left side of the mesorectum, including a 9 mm lesion.  The distance from the tumor to the anal sphincter was 8 mm.  PET scan on 01/27/2017 revealed intense metabolic activity associated rectal mass consists with primary rectal carcinoma.  There was a hypermetabolic metastatic lesion to the central LEFT hepatic lobe.  There was metabolic activity associated with small LEFT lower lobe nodule which was most consistent with pulmonary metastasis.  Liver MRI on 01/31/2017 revealed a 2.6 x 2.7 x 2.1 cm lesion in segment VIII of the liver near the junction with segment IVa c/w a metastatic lesion.  There was a 1.1 x 0.8 cm suspected metastatic lesion peripherally in segment VI of the liver.  CT guided liver biopsy at [facility] on 02/07/2017 revealed malignant cells c/w metastatic colorectal adenocarcinoma.  Tumor was sent for  MMR/MSI, KRAS/NRAS, and BRAF.  She is scheduled to have liver directed therapy (ablation) at [facility] on 03/19/2017.  CEA has been followed: 31.6 on 01/15/2017, 38.1 on 02/18/2017, 25.1 on 03/18/2017, 11.8 on 04/08/2017, 9.2 on 04/22/2017, 9.5 on 05/20/2017, and 9.6 on 06/19/2017.  She received 2 cycles of FOLFOX  (02/18/2017 - 03/04/2017).  She developed transient cold induced neuropathy.  She developed wheezing and hypoxia with cycle #  2 felt secondary to oxaliplatin.  She received 4 cycles of FOLFIRI (03/24/2017 - 04/22/2017) with Neulasta support.  Diarrhea was controlled with Imodium.  Abdomen and pelvic CT on 05/16/2017  revealed persistent but decreased asymmetric wall thickening in the rectum.  The medial segment left liver lesion was smaller (2.7 x 2.6 cm to 1.9 x 1.6 cm). A tiny lesion seen in segment VI of the liver on previous MRI was not evident on today's CT scan.  The left lower lobe pulmonary nodule was smaller (9 mm to 6 mm).  There was no new or progressive findings in the abdomen and pelvis.  She underwent CT guided microwave ablation of the liver lesion on 06/06/2017.    She began radiation on 06/18/2017.  She receives daily Xeloda with radiation. Radiation and Xeloda completed on 08/15/2017.  Chest CT with contrast on 08/19/2017 revealed left lower lobe 6 mm solid pulmonary nodule, decreased since 01/15/2017 chest CT. Stable 7 mm right lower lobe pulmonary nodule.  There was no new or progressive metastatic disease in the chest.  The low-attenuation ablation defect in the superior left liver lobe, was incompletely evaluated.  MRI of her pelvis at [facility] on 09/03/2017 demonstrated a partial response of the primary tumor and extramural disease. Post treatment category ymrT T3b. There was an overall decrease in size in the previously seen perirectal node. Hypointense changes at the site of prior tumor focally contacedt buts does not definitely invade the LEFT levator ani. Tumor contacted, but does not definitely invade the internal anal sphincter. No evidence of residual or recurrent hepatic disease following microwave ablation.   She underwent APR with descending colostomy and VRAM flap on 10/24/2017. She underwent a VATS wedge resection to her LEFT lung. 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 Final diagnosis was pT3N0M1a rectal adenocarcinoma with metastasis to left lower lobe of the lung.   She has had post-operative pain and ostomy ischemia.  She underwent surgical ostomy revision on 11/28/2017.  Pathology  showed focal mucosal erosion and fat necrosis c/w ischemia. There was extensive underlying fat necrosis. No dysplasia or carcinoma was identified. She was treated with oral Augmentin and wound packing.  She has been treated with oxycodone 5 mg po q 4 hrs prn, gabapentin 900 mg TID.  She was treated with Bactrim on 01/18/2018.  She was referred to local pain management clinic on 01/27/2018.   She has an 80 pack year smoking history.  She is smoking.  She was treated for bronchitis and reactive airway disease on 06/30/2017.  She completed azithromycin and a prednisone pulse.  Symptomatically, she has chronic post-operative abdominal pain. She has a colostomy.  Exam  Reveals tenderness at superior margin of incision without evidence of infection.   Plan: 1.  Labs today:  CBC with diff, CMP. 2.  Discuss interval events and procedures. Has undergone APR with descending colostomy, colostomy revision and relocation, and VATS wedge resection of LEFT lung.  3.  Discuss treatment. Will postpone PET secondary to prior issues with infection and opt for CT of abdomen and pelvis. 4.  Schedule port flushes today and every 6 weeks.  5.  Follow up with [facility] regarding ostomy supplies. Requesting Rx. Will need to find out where patient gets supplies.    6.  Follow up with pain management for further evaluation and management. Unsure where she was referred. Will refer to pain management from this clinic.  7.  Follow up with  surgical oncology (Dr Michel Harrow) in June as already scheduled.  8.  RTC after CT scan for MD assessment.    Honor Loh, NP  02/02/2018, 11:31 AM   I saw and evaluated the patient, participating in the key portions of the service and reviewing pertinent diagnostic studies and records. I reviewed the nurse practitioner's note and agree with the findings and the plan.  The assessment and plan were discussed with the patient.  Multiple questions were asked by the patient and answered.   Lequita Asal, MD 02/02/2018, 11:31 AM

## 2018-02-02 ENCOUNTER — Encounter: Payer: Self-pay | Admitting: Hematology and Oncology

## 2018-02-02 ENCOUNTER — Inpatient Hospital Stay (HOSPITAL_BASED_OUTPATIENT_CLINIC_OR_DEPARTMENT_OTHER): Payer: Medicaid Other | Admitting: Hematology and Oncology

## 2018-02-02 ENCOUNTER — Inpatient Hospital Stay: Payer: Medicaid Other

## 2018-02-02 VITALS — BP 120/68 | HR 84 | Temp 97.3°F | Resp 18 | Ht 67.0 in | Wt 176.8 lb

## 2018-02-02 DIAGNOSIS — Z933 Colostomy status: Secondary | ICD-10-CM | POA: Diagnosis not present

## 2018-02-02 DIAGNOSIS — Z95828 Presence of other vascular implants and grafts: Secondary | ICD-10-CM

## 2018-02-02 DIAGNOSIS — C787 Secondary malignant neoplasm of liver and intrahepatic bile duct: Secondary | ICD-10-CM | POA: Diagnosis not present

## 2018-02-02 DIAGNOSIS — C7802 Secondary malignant neoplasm of left lung: Secondary | ICD-10-CM

## 2018-02-02 DIAGNOSIS — J984 Other disorders of lung: Secondary | ICD-10-CM

## 2018-02-02 DIAGNOSIS — Z7189 Other specified counseling: Secondary | ICD-10-CM

## 2018-02-02 DIAGNOSIS — Z720 Tobacco use: Secondary | ICD-10-CM | POA: Diagnosis not present

## 2018-02-02 DIAGNOSIS — R109 Unspecified abdominal pain: Secondary | ICD-10-CM

## 2018-02-02 DIAGNOSIS — C20 Malignant neoplasm of rectum: Secondary | ICD-10-CM

## 2018-02-02 DIAGNOSIS — G893 Neoplasm related pain (acute) (chronic): Secondary | ICD-10-CM

## 2018-02-02 LAB — CBC WITH DIFFERENTIAL/PLATELET
Basophils Absolute: 0 10*3/uL (ref 0–0.1)
Basophils Relative: 0 %
Eosinophils Absolute: 0.1 10*3/uL (ref 0–0.7)
Eosinophils Relative: 2 %
HCT: 31.6 % — ABNORMAL LOW (ref 35.0–47.0)
Hemoglobin: 10.3 g/dL — ABNORMAL LOW (ref 12.0–16.0)
Lymphocytes Relative: 19 %
Lymphs Abs: 0.9 10*3/uL — ABNORMAL LOW (ref 1.0–3.6)
MCH: 26.6 pg (ref 26.0–34.0)
MCHC: 32.8 g/dL (ref 32.0–36.0)
MCV: 81 fL (ref 80.0–100.0)
Monocytes Absolute: 0.4 10*3/uL (ref 0.2–0.9)
Monocytes Relative: 8 %
Neutro Abs: 3.2 10*3/uL (ref 1.4–6.5)
Neutrophils Relative %: 71 %
Platelets: 319 10*3/uL (ref 150–440)
RBC: 3.9 MIL/uL (ref 3.80–5.20)
RDW: 17.6 % — ABNORMAL HIGH (ref 11.5–14.5)
WBC: 4.6 10*3/uL (ref 3.6–11.0)

## 2018-02-02 LAB — COMPREHENSIVE METABOLIC PANEL
ALT: 10 U/L — ABNORMAL LOW (ref 14–54)
AST: 18 U/L (ref 15–41)
Albumin: 3.6 g/dL (ref 3.5–5.0)
Alkaline Phosphatase: 92 U/L (ref 38–126)
Anion gap: 10 (ref 5–15)
BUN: 9 mg/dL (ref 6–20)
CO2: 24 mmol/L (ref 22–32)
Calcium: 8.9 mg/dL (ref 8.9–10.3)
Chloride: 104 mmol/L (ref 101–111)
Creatinine, Ser: 0.7 mg/dL (ref 0.44–1.00)
GFR calc Af Amer: >60 mL/min (ref >60)
GFR calc non Af Amer: >60 mL/min (ref >60)
Glucose, Bld: 119 mg/dL — ABNORMAL HIGH (ref 65–99)
Potassium: 3.8 mmol/L (ref 3.5–5.1)
Sodium: 138 mmol/L (ref 135–145)
Total Bilirubin: 0.3 mg/dL (ref 0.3–1.2)
Total Protein: 6.8 g/dL (ref 6.5–8.1)

## 2018-02-02 MED ORDER — SODIUM CHLORIDE 0.9% FLUSH
10.0000 mL | INTRAVENOUS | Status: AC | PRN
  Administered 2018-02-02: 10 mL
  Filled 2018-02-02: qty 10

## 2018-02-02 MED ORDER — HEPARIN SOD (PORK) LOCK FLUSH 100 UNIT/ML IV SOLN
500.0000 [IU] | INTRAVENOUS | Status: AC | PRN
  Administered 2018-02-02: 500 [IU]

## 2018-02-02 NOTE — Progress Notes (Signed)
Patient c/o abdomen and lower back pain. Mostly cramps all the time

## 2018-02-04 ENCOUNTER — Ambulatory Visit: Admission: RE | Admit: 2018-02-04 | Payer: Medicaid Other | Source: Ambulatory Visit

## 2018-02-06 ENCOUNTER — Ambulatory Visit: Payer: Medicaid Other | Admitting: Hematology and Oncology

## 2018-02-17 ENCOUNTER — Ambulatory Visit
Admission: RE | Admit: 2018-02-17 | Discharge: 2018-02-17 | Disposition: A | Discharge status: Home / Self Care | Payer: Medicaid Other | Source: Ambulatory Visit | Attending: Urgent Care | Admitting: Urgent Care

## 2018-02-17 DIAGNOSIS — C7802 Secondary malignant neoplasm of left lung: Secondary | ICD-10-CM | POA: Diagnosis not present

## 2018-02-17 DIAGNOSIS — G893 Neoplasm related pain (acute) (chronic): Secondary | ICD-10-CM | POA: Insufficient documentation

## 2018-02-17 DIAGNOSIS — Z933 Colostomy status: Secondary | ICD-10-CM | POA: Diagnosis not present

## 2018-02-17 DIAGNOSIS — C20 Malignant neoplasm of rectum: Secondary | ICD-10-CM | POA: Diagnosis present

## 2018-02-17 DIAGNOSIS — I714 Abdominal aortic aneurysm, without rupture: Secondary | ICD-10-CM | POA: Diagnosis not present

## 2018-02-17 DIAGNOSIS — C787 Secondary malignant neoplasm of liver and intrahepatic bile duct: Secondary | ICD-10-CM | POA: Diagnosis not present

## 2018-02-17 MED ORDER — IOPAMIDOL (ISOVUE-300) INJECTION 61%
100.0000 mL | Freq: Once | INTRAVENOUS | Status: AC | PRN
  Administered 2018-02-17: 100 mL via INTRAVENOUS

## 2018-02-18 ENCOUNTER — Inpatient Hospital Stay: Payer: Medicaid Other

## 2018-02-18 ENCOUNTER — Encounter: Payer: Self-pay | Admitting: Hematology and Oncology

## 2018-02-18 ENCOUNTER — Inpatient Hospital Stay: Payer: Medicaid Other | Attending: Hematology and Oncology | Admitting: Hematology and Oncology

## 2018-02-18 ENCOUNTER — Other Ambulatory Visit: Payer: Self-pay

## 2018-02-18 ENCOUNTER — Other Ambulatory Visit: Payer: Self-pay | Admitting: Urgent Care

## 2018-02-18 VITALS — BP 147/70 | HR 80 | Temp 96.4°F | Resp 18 | Wt 176.8 lb

## 2018-02-18 DIAGNOSIS — C7802 Secondary malignant neoplasm of left lung: Secondary | ICD-10-CM

## 2018-02-18 DIAGNOSIS — Z933 Colostomy status: Secondary | ICD-10-CM

## 2018-02-18 DIAGNOSIS — D649 Anemia, unspecified: Secondary | ICD-10-CM | POA: Insufficient documentation

## 2018-02-18 DIAGNOSIS — Z7189 Other specified counseling: Secondary | ICD-10-CM

## 2018-02-18 DIAGNOSIS — C20 Malignant neoplasm of rectum: Secondary | ICD-10-CM | POA: Diagnosis not present

## 2018-02-18 DIAGNOSIS — C787 Secondary malignant neoplasm of liver and intrahepatic bile duct: Secondary | ICD-10-CM | POA: Diagnosis not present

## 2018-02-18 DIAGNOSIS — Z720 Tobacco use: Secondary | ICD-10-CM | POA: Insufficient documentation

## 2018-02-18 LAB — IRON AND TIBC
IRON: 48 ug/dL (ref 28–170)
SATURATION RATIOS: 11 % (ref 10.4–31.8)
TIBC: 424 ug/dL (ref 250–450)
UIBC: 376 ug/dL

## 2018-02-18 LAB — RETICULOCYTES
RBC.: 4.09 MIL/uL (ref 3.80–5.20)
Retic Count, Absolute: 69.5 10*3/uL (ref 19.0–183.0)
Retic Ct Pct: 1.7 % (ref 0.4–3.1)

## 2018-02-18 LAB — FOLATE: Folate: 12.9 ng/mL (ref >5.9)

## 2018-02-18 LAB — FERRITIN: Ferritin: 14 ng/mL (ref 11–307)

## 2018-02-18 LAB — TSH: TSH: 1.43 u[IU]/mL (ref 0.350–4.500)

## 2018-02-18 LAB — VITAMIN B12: Vitamin B-12: 122 pg/mL — ABNORMAL LOW (ref 180–914)

## 2018-02-18 MED ORDER — GABAPENTIN 300 MG PO CAPS: 300.0000 mg | ORAL_CAPSULE | Freq: Three times a day (TID) | ORAL | 0 refills | Status: DC

## 2018-02-18 MED ORDER — OMEPRAZOLE 20 MG PO CPDR: 20.0000 mg | DELAYED_RELEASE_CAPSULE | Freq: Every day | ORAL | 1 refills | Status: DC

## 2018-02-18 NOTE — Progress Notes (Signed)
[facility] day:  02/18/2018   Chief Complaint: Tamara Hall is a 62 y.o. 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   HPI:  The patient was last seen in the medical oncology clinic on 02/02/2018.  At that time, she had chronic post-operative abdominal pain. She had a colostomy.  Exam revealed tenderness at superior margin of incision without evidence of infection. Hematocrit was 31.6, hemoglobin 10.3, and MCV 81.  She was referred to pain management.  Abdomen and pelvic CT on 02/17/2018 revealed evolution of the ablation zone in segment 4A, now measuring 3.7 x 3.1 cm, decreased. There was no convincing findings to suggest viable tumor.  She is s/p abdominoperineal resection with left lower quadrant colostomy.  There was no evidence of new/progressive metastatic disease.  She is s/p left lower lobe pulmonary wedge resection, incompletely visualized.  There was a 3.3 cm infrarenal abdominal aortic aneurysm, grossly unchanged. Recommend followup by ultrasound in 3 years  During the interim, patient is doing "ok". Her pain continues to improve. Pain rated 4/10 today in clinic. She has no acute concerns. Patient is eating ok. Weight remains stable. Patient denies B symptoms and interval infections.    Past Medical History:  Diagnosis Date  . Cancer ([place])   . Hypertension   . Pneumonia    20+ years ago    Past Surgical History:  Procedure Laterality Date  . ABDOMINAL HYSTERECTOMY    . BREAST SURGERY    . COLONOSCOPY WITH PROPOFOL N/A 01/14/2017   Procedure: COLONOSCOPY WITH PROPOFOL;  Surgeon: Lucilla Lame, MD;  Location: [facility] END[facility];  Service: Endoscopy;  Laterality: N/A;  . ELBOW SURGERY Right    Has had 4 surgeries on right elbow.  Marland Kitchen HAND SURGERY Right 2008  . JOINT REPLACEMENT    . PORTA CATH INSERTION N/A 01/29/2017   Procedure:  Glori Luis Cath Insertion;  Surgeon: Algernon Huxley, MD;  Location: [facility];  Service: Cardiovascular;  Laterality: N/A;  . TONSILLECTOMY      Family History  Problem Relation Age of Onset  . Lung cancer Mother   . Lung cancer Father   . Brain cancer Maternal Grandmother     Social History:  reports that she has been smoking cigarettes.  She has a 80.00 pack-year smoking history. She has never used smokeless tobacco. She reports that she does not drink alcohol or use drugs.  She was smoking 1/2 packs/day.  She stopped smoking 10/24/2017.  She lives in [place].  She has 3 children (ages 65, 58, and 54).  She smoked 2 packs a day.  She started smoking at age 73. Stopped smoking on 10/24/2017.  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  [name] is her significant other.  She is alone today.  Allergies: No Known Allergies  Current Medications: Current Outpatient Medications  Medication Sig Dispense Refill  . albuterol (PROVENTIL HFA;VENTOLIN HFA) 108 (90 Base) MCG/ACT inhaler Inhale 2 puffs into the lungs every 6 (six) hours as needed for wheezing or shortness of breath. 1 Inhaler 2  . calcium-vitamin D (OSCAL WITH D) 500-200 MG-UNIT tablet Take 1 tablet by mouth daily with breakfast.    . gabapentin (NEURONTIN) 300 MG capsule Take 300 mg by mouth 3 (three) times daily.    Marland Kitchen omeprazole (  PRILOSEC) 20 MG capsule TAKE 1 CAPSULE BY MOUTH EVERY DAY 30 capsule 3  . ondansetron (ZOFRAN) 8 MG tablet Take 1 tablet (8 mg total) by mouth every 8 (eight) hours as needed. 30 tablet 2  . capecitabine (XELODA) 150 MG tablet Take one 150 mg pill twice a day for 5 days each week during radiation with three 500 mg tablets (Patient not taking: Reported on 02/02/2018) 50 tablet 0  . capecitabine (XELODA) 500 MG tablet Take three  500 mg pills twice a day for 5 days each week during radiation with one 150 mg tablet (Patient not taking: Reported on 02/02/2018)  150 tablet 0  . Hydrocodone-Acetaminophen 7.5-300 MG TABS Take 1 tablet every 6 (six) hours as needed by mouth (pain). (Patient not taking: Reported on 02/02/2018) 60 each 0  . lidocaine-prilocaine (EMLA) cream APPLY A SMALL AMOUNT TO PORT SITE AT LEAST 1 HOUR BEFORE BEING ACCESSED. COVER IN PLASTIC WRAP. (Patient not taking: Reported on 02/02/2018) 30 g 1  . LORazepam (ATIVAN) 0.5 MG tablet Take 1 tablet (0.5 mg total) by mouth every 6 (six) hours as needed. For nausea or vomiting (Patient not taking: Reported on 02/18/2018) 30 tablet 1  . Magnesium Hydroxide (MILK OF MAGNESIA PO) Take 30 mLs by mouth at bedtime.     . potassium chloride SA (K-DUR,KLOR-CON) 20 MEQ tablet Take 1 tablet (20 mEq total) by mouth daily. (Patient not taking: Reported on 02/02/2018) 20 tablet 0   No current facility-administered medications for this visit.     Review of Systems  Constitutional: Negative for diaphoresis, fever, malaise/fatigue and weight loss.       Chronic pain issues.   HENT: Negative.  Negative for congestion, ear pain, hearing loss, nosebleeds, sinus pain, sore throat and tinnitus.   Eyes: Negative.  Negative for blurred vision, double vision, discharge and redness.  Respiratory: Positive for shortness of breath (exertional). Negative for cough, hemoptysis, sputum production and wheezing.   Cardiovascular: Negative.  Negative for chest pain, palpitations, orthopnea, claudication, leg swelling and PND.  Gastrointestinal: Positive for abdominal pain and heartburn (on PPI). Negative for blood in stool, constipation, diarrhea, melena, nausea and vomiting.  Genitourinary: Negative.  Negative for dysuria, frequency, hematuria and urgency.       Colostomy s/p APR  Musculoskeletal: Negative.  Negative for back pain, falls, joint pain, myalgias and neck pain.  Skin: Negative.  Negative for itching and rash.  Neurological: Negative.  Negative for dizziness, tingling, tremors, sensory change, speech change,  focal weakness, weakness and headaches.  Endo/Heme/Allergies: Negative.  Does not bruise/bleed easily.  Psychiatric/Behavioral: Negative for depression, memory loss and suicidal ideas. The patient is not nervous/anxious and does not have insomnia.   All other systems reviewed and are negative.  Physical Exam: Blood pressure (!) 147/70, pulse 80, temperature (!) 96.4 F (35.8 C), temperature source Tympanic, resp. rate 18, weight 176 lb 12.9 oz (80.2 kg). GENERAL:  Well developed, well nourished, woman sitting comfortably in the exam room in no acute distress. She gingerly gets onto table. MENTAL STATUS:  Alert and oriented to person, place and time. HEAD:  Short graying hair.  Normocephalic, atraumatic, face symmetric, no Cushingoid features. EYES:  Blue eyes.  Pupils equal round and reactive to light and accomodation.  No conjunctivitis or scleral icterus. ENT:  Oropharynx clear without lesion.  Tongue normal. Mucous membranes moist.  RESPIRATORY:  Clear to auscultation without rales, wheezes or rhonchi. CARDIOVASCULAR:  Regular rate and rhythm without murmur, rub or gallop. ABDOMEN:  Soft, tender to palpation superior margin of midline incision, old right sided ostomy incision and above current ostomy.  No guarding or rebound tenderness.  Active bowel sounds and no hepatosplenomegaly.  No masses. Left sided ostomy. SKIN:  Well healed abdominal incisions.  No evidence of infection.  No rashes, ulcers or lesions. EXTREMITIES: No edema, no skin discoloration or tenderness.  No palpable cords. LYMPH NODES: No palpable cervical, supraclavicular, axillary or inguinal adenopathy  NEUROLOGICAL: Unremarkable. PSYCH:  Appropriate.  Imaging studies: 01/13/2017:  Abdomen and pelvic CT revealed irregular thickening of the rectum suspicious for carcinoma.  There was a 1.6 cm hypoattenuating lesion in the right hepatic lobe worrisome for metastatic disease.   01/15/2017:  Chest CT revealed a 9 mm  noncalcified left lower lobe pulmonary nodule (new). 01/16/2017:  Pelvic MRI revealed rectal adenocarcinoma T3N1.  There was extension beyond the muscularis propria (17 mm).  There were mesorectal nodes >= 5 mm.  There was adenopathy on the left side of the mesorectum, including a 9 mm lesion.  The distance from the tumor to the anal sphincter was 8 mm. 01/27/2017:  PET scan revealed intense metabolic activity associated rectal mass consists with primary rectal carcinoma.  There was a hypermetabolic metastatic lesion to the central LEFT hepatic lobe.  There was metabolic activity associated with small LEFT lower lobe nodule which was most consistent with pulmonary metastasis. 01/31/2017:  Liver MRI revealed a 2.6 x 2.7 x 2.1 cm lesion in segment VIII of the liver near the junction with segment IVa c/w a metastatic lesion.  There was a 1.1 x 0.8 cm suspected metastatic lesion peripherally in segment VI of the liver.   05/16/2017:  Abdomen and pelvic CT revealed persistent but decreased asymmetric wall thickening in the rectum.  The medial segment left liver lesion was smaller (2.7 x 2.6 cm to 1.9 x 1.6 cm). A tiny lesion seen in segment VI of the liver on previous MRI was not evident on today's CT scan.  The left lower lobe pulmonary nodule was smaller (9 mm to 6 mm).  There was no new or progressive findings in the abdomen and pelvis. 08/19/2017:  Chest CT with contrast on 08/19/2017 revealed left lower lobe 6 mm solid pulmonary nodule, decreased since 01/15/2017 chest CT. Stable 7 mm right lower lobe pulmonary nodule.  There was no new or progressive metastatic disease in the chest.   09/03/2018:  Pelvic MRI at [facility] demonstrated a partial response of the primary tumor and extramural disease. Post treatment category ymrT T3b. There was an overall decrease in size in the previously seen perirectal node. Hypointense changes at the site of prior tumor focally contacedt buts does not definitely invade the LEFT  levator ani. Tumor contacted, but does not definitely invade the internal anal sphincter. No evidence of residual or recurrent hepatic disease following microwave ablation.  02/17/2018:  Abdomen and pelvic CT revealed evolution of the ablation zone in segment 4A, now measuring 3.7 x 3.1 cm, decreased. There was no findings to suggest viable tumor.  She is s/p abdominoperineal resection with left lower quadrant colostomy.  There was no evidence of new/progressive metastatic disease.  She is s/p left lower lobe pulmonary wedge resection, incompletely visualized.   No visits with results within 3 Day(s) from this visit.  Latest known visit with results is:  Infusion on 02/02/2018  Component Date Value Ref Range Status  . Sodium 02/02/2018 138  135 - 145 mmol/L Final  . Potassium 02/02/2018 3.8  3.5 -  5.1 mmol/L Final  . Chloride 02/02/2018 104  101 - 111 mmol/L Final  . CO2 02/02/2018 24  22 - 32 mmol/L Final  . Glucose, Bld 02/02/2018 119* 65 - 99 mg/dL Final  . BUN 02/02/2018 9  6 - 20 mg/dL Final  . Creatinine, Ser 02/02/2018 0.70  0.44 - 1.00 mg/dL Final  . Calcium 02/02/2018 8.9  8.9 - 10.3 mg/dL Final  . Total Protein 02/02/2018 6.8  6.5 - 8.1 g/dL Final  . Albumin 02/02/2018 3.6  3.5 - 5.0 g/dL Final  . AST 02/02/2018 18  15 - 41 U/L Final  . ALT 02/02/2018 10* 14 - 54 U/L Final  . Alkaline Phosphatase 02/02/2018 92  38 - 126 U/L Final  . Total Bilirubin 02/02/2018 0.3  0.3 - 1.2 mg/dL Final  . GFR calc non Af Amer 02/02/2018 >60  >60 mL/min Final  . GFR calc Af Amer 02/02/2018 >60  >60 mL/min Final   Comment: (NOTE) The eGFR has been calculated using the CKD EPI equation. This calculation has not been validated in all clinical situations. eGFR's persistently <60 mL/min signify possible Chronic Kidney Disease.   Georgiann Hahn gap 02/02/2018 10  5 - 15 Final   Performed at [facility], [address] [place], [place] [address]  . WBC 02/02/2018 4.6  3.6 - 11.0 K/uL Final  . RBC  02/02/2018 3.90  3.80 - 5.20 MIL/uL Final  . Hemoglobin 02/02/2018 10.3* 12.0 - 16.0 g/dL Final  . HCT 02/02/2018 31.6* 35.0 - 47.0 % Final  . MCV 02/02/2018 81.0  80.0 - 100.0 fL Final  . MCH 02/02/2018 26.6  26.0 - 34.0 pg Final  . MCHC 02/02/2018 32.8  32.0 - 36.0 g/dL Final  . RDW 02/02/2018 17.6* 11.5 - 14.5 % Final  . Platelets 02/02/2018 319  150 - 440 K/uL Final  . Neutrophils Relative % 02/02/2018 71  % Final  . Neutro Abs 02/02/2018 3.2  1.4 - 6.5 K/uL Final  . Lymphocytes Relative 02/02/2018 19  % Final  . Lymphs Abs 02/02/2018 0.9* 1.0 - 3.6 K/uL Final  . Monocytes Relative 02/02/2018 8  % Final  . Monocytes Absolute 02/02/2018 0.4  0.2 - 0.9 K/uL Final  . Eosinophils Relative 02/02/2018 2  % Final  . Eosinophils Absolute 02/02/2018 0.1  0 - 0.7 K/uL Final  . Basophils Relative 02/02/2018 0  % Final  . Basophils Absolute 02/02/2018 0.0  0 - 0.1 K/uL Final   Performed at [facility], [address], [place], [place] [address]    Assessment:  Tamara Hall is a 62 y.o. female with clinical stage T3N1M1 rectal cancer.   She presented with a 9 month history of rectal bleeding.  Colonoscopy on 01/14/2017 revealed a frond-like/villous non-obstructing large mass in the rectum. The mass was non-circumferential.  Biopsies revealed high grade dysplasia within an adenoma (? sampling error).  CEA was 31.6 on 01/15/2017.  PET scan on 01/27/2017 revealed intense metabolic activity associated rectal mass consists with primary rectal carcinoma.  There was a hypermetabolic metastatic lesion to the central LEFT hepatic lobe.  There was metabolic activity associated with small LEFT lower lobe nodule which was most consistent with pulmonary metastasis.  CT guided liver biopsy at [facility] on 02/07/2017 revealed malignant cells c/w metastatic colorectal adenocarcinoma.  Tumor was sent for  MMR/MSI, KRAS/NRAS, and BRAF.  She is scheduled to have liver directed therapy (ablation) at [facility] on  03/19/2017.  CEA has been followed: 31.6  on 01/15/2017, 38.1 on 02/18/2017, 25.1 on 03/18/2017, 11.8 on 04/08/2017, 9.2 on 04/22/2017, 9.5 on 05/20/2017, and 9.6 on 06/19/2017.  She received 2 cycles of FOLFOX  (02/18/2017 - 03/04/2017).  She developed transient cold induced neuropathy.  She developed wheezing and hypoxia with cycle #2 felt secondary to oxaliplatin.  She received 4 cycles of FOLFIRI (03/24/2017 - 04/22/2017) with Neulasta support.  Diarrhea was controlled with Imodium.  She underwent CT guided microwave ablation of the liver lesion on 06/06/2017.    She began radiation on 06/18/2017.  She receives daily Xeloda with radiation. Radiation and Xeloda completed on 08/15/2017.  She underwent APR with descending colostomy and VRAM flap on 10/24/2017. She underwent a VATS wedge resection to her LEFT lung. 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 Final diagnosis was pT3N0M1a rectal adenocarcinoma with metastasis to left lower lobe of the lung.   She has had post-operative pain and ostomy ischemia.  She underwent surgical ostomy revision on 11/28/2017.  Pathology showed focal mucosal erosion and fat necrosis c/w ischemia. There was extensive underlying fat necrosis. No dysplasia or carcinoma was identified. She was treated with oral Augmentin and wound packing.  She has been treated with oxycodone 5 mg po q 4 hrs prn, gabapentin 900 mg TID.  She was treated with Bactrim on 01/18/2018.  She was referred to local pain management clinic on 01/27/2018.   Abdomen and pelvic CT on 02/17/2018 revealed evolution of the ablation zone in segment 4A, now measuring 3.7 x 3.1 cm, decreased. There was no findings to suggest viable tumor.  She is s/p abdominoperineal resection with left lower quadrant colostomy.  There was no evidence of new/progressive metastatic disease.  She is s/p left lower lobe pulmonary wedge  resection, incompletely visualized.    She has a normocytic anemia.  She has an 80 pack year smoking history.  She is smoking.  Symptomatically, she has chronic post-operative abdominal pain. She has a colostomy.  Exam reveals tenderness around surgical sites without evidence of infection.  Hemoglobin is 10.3.  Plan: 1.  Labs today:  CEA, ferritin, iron studies, B12, folate, retic, TSH. 2.  Discuss interval abdomen and pelvic scans.  No evidence of recurrent disease. 3.  Discuss need for chest CT give prior history of metastatic disease to the chest s/p wedge resection. 4.  Discuss anemia and planned laboratory work-up. 5.  Refill Rx for omeprazole (Disp # 90 r1) 6.  Refill Rx for gabapentin 300 mg TID (Disp # 90) 7.  Schedule port flushes today and every 6 weeks.  8.  Refer to [facility]) to discuss financial concerns related to [facility] and coverage of medications and ostomy supplies.  9.  Follow up with pain management for further evaluation and management. Referred to pain management from this clinic.  10.  Follow up with surgical oncology (Dr Michel Harrow).  Case discussed with Dr. Michel Harrow who can see patient sooner.  She will review recent imaging.  11.  RTC in 3 months for MD assessment and labs (CBC with diff, CMP, CEA).   Honor Loh, NP  02/18/2018, 10:22 AM   I saw and evaluated the patient, participating in the key portions of the service and reviewing pertinent diagnostic studies and records. I reviewed the nurse practitioner's note and agree with the findings and the plan.  The assessment and plan were discussed with the patient.  Several questions were asked by the patient and answered.  Lequita Asal, MD 02/18/2018, 10:22 AM

## 2018-02-18 NOTE — Progress Notes (Signed)
Patient here for follow. No concerns voiced.

## 2018-02-19 ENCOUNTER — Telehealth: Payer: Self-pay | Admitting: *Deleted

## 2018-02-19 LAB — CEA: CEA1: 3.7 ng/mL (ref 0.0–4.7)

## 2018-02-19 NOTE — Telephone Encounter (Signed)
-----   Message from Karen Kitchens, NP sent at 02/18/2018  5:49 PM EDT ----- Regarding: Call Please let her know that her B12 is very low at 122. I have placed orders for weekly B12 injections x 6, then monthly. I have sent a message to Velna Hatchet to authorize. Once authorized, please have patient scheduled. I would like to start this week if possible.   Thanks,  Gaspar Bidding

## 2018-02-19 NOTE — Telephone Encounter (Signed)
Called patient to inform her that her B-12 is low.  Per MD she will need weekly B-12 injections, then monthly.  Asked patient if she could come for first one on Monday.  She states she can.  I will have scheduling call her with appointment time.

## 2018-02-20 ENCOUNTER — Ambulatory Visit: Payer: Medicaid Other | Admitting: Urgent Care

## 2018-02-23 ENCOUNTER — Inpatient Hospital Stay: Payer: Medicaid Other

## 2018-02-23 DIAGNOSIS — C20 Malignant neoplasm of rectum: Secondary | ICD-10-CM | POA: Diagnosis not present

## 2018-02-23 DIAGNOSIS — E876 Hypokalemia: Secondary | ICD-10-CM

## 2018-02-23 MED ORDER — CYANOCOBALAMIN 1000 MCG/ML IJ SOLN
1000.0000 ug | Freq: Once | INTRAMUSCULAR | Status: AC
  Administered 2018-02-23: 1000 ug via INTRAMUSCULAR

## 2018-03-03 ENCOUNTER — Inpatient Hospital Stay: Payer: Medicaid Other

## 2018-03-03 DIAGNOSIS — E876 Hypokalemia: Secondary | ICD-10-CM

## 2018-03-03 DIAGNOSIS — C20 Malignant neoplasm of rectum: Secondary | ICD-10-CM | POA: Diagnosis not present

## 2018-03-03 MED ORDER — CYANOCOBALAMIN 1000 MCG/ML IJ SOLN
1000.0000 ug | Freq: Once | INTRAMUSCULAR | Status: AC
  Administered 2018-03-03: 1000 ug via INTRAMUSCULAR

## 2018-03-04 ENCOUNTER — Ambulatory Visit
Admission: RE | Admit: 2018-03-04 | Discharge: 2018-03-04 | Disposition: A | Discharge status: Home / Self Care | Payer: Medicaid Other | Source: Ambulatory Visit | Attending: Urgent Care | Admitting: Urgent Care

## 2018-03-04 DIAGNOSIS — D649 Anemia, unspecified: Secondary | ICD-10-CM | POA: Diagnosis not present

## 2018-03-04 DIAGNOSIS — I2510 Atherosclerotic heart disease of native coronary artery without angina pectoris: Secondary | ICD-10-CM | POA: Diagnosis not present

## 2018-03-04 DIAGNOSIS — Z9889 Other specified postprocedural states: Secondary | ICD-10-CM | POA: Diagnosis not present

## 2018-03-04 DIAGNOSIS — C20 Malignant neoplasm of rectum: Secondary | ICD-10-CM | POA: Insufficient documentation

## 2018-03-04 DIAGNOSIS — C7802 Secondary malignant neoplasm of left lung: Secondary | ICD-10-CM | POA: Insufficient documentation

## 2018-03-04 DIAGNOSIS — I700 Atherosclerosis of aorta: Secondary | ICD-10-CM | POA: Insufficient documentation

## 2018-03-04 DIAGNOSIS — C787 Secondary malignant neoplasm of liver and intrahepatic bile duct: Secondary | ICD-10-CM | POA: Insufficient documentation

## 2018-03-09 ENCOUNTER — Inpatient Hospital Stay: Payer: Medicaid Other

## 2018-03-09 ENCOUNTER — Telehealth: Payer: Self-pay | Admitting: *Deleted

## 2018-03-09 NOTE — Telephone Encounter (Signed)
Patient asking to be called with results of CT as she does not return for 6 weeks for follow up   IMPRESSION: 1. Stable CT chest from 08/19/2017. The index right lower lobe lung nodule measuring 7 mm is unchanged. 2. No new or progressive nodularity identified. 3. Continued decrease in size of ablation defect within segment 4 a of the liver. 4. Aortic atherosclerosis and 3 vessel coronary artery atherosclerotic calcifications.   Electronically Signed   By: Kerby Moors M.D.   On: 03/04/2018 13:33

## 2018-03-09 NOTE — Telephone Encounter (Signed)
Ok to release results to patient. Results stable from previous scan done in 08/2017.

## 2018-03-10 ENCOUNTER — Inpatient Hospital Stay: Payer: Medicaid Other | Attending: Hematology and Oncology

## 2018-03-10 DIAGNOSIS — C20 Malignant neoplasm of rectum: Secondary | ICD-10-CM | POA: Insufficient documentation

## 2018-03-10 DIAGNOSIS — C7802 Secondary malignant neoplasm of left lung: Secondary | ICD-10-CM | POA: Diagnosis not present

## 2018-03-10 DIAGNOSIS — C787 Secondary malignant neoplasm of liver and intrahepatic bile duct: Secondary | ICD-10-CM | POA: Insufficient documentation

## 2018-03-10 DIAGNOSIS — Z452 Encounter for adjustment and management of vascular access device: Secondary | ICD-10-CM | POA: Insufficient documentation

## 2018-03-10 DIAGNOSIS — D649 Anemia, unspecified: Secondary | ICD-10-CM | POA: Insufficient documentation

## 2018-03-10 DIAGNOSIS — E876 Hypokalemia: Secondary | ICD-10-CM

## 2018-03-10 MED ORDER — CYANOCOBALAMIN 1000 MCG/ML IJ SOLN
1000.0000 ug | Freq: Once | INTRAMUSCULAR | Status: AC
  Administered 2018-03-10: 1000 ug via INTRAMUSCULAR

## 2018-03-10 NOTE — Telephone Encounter (Signed)
Patient informed of results.  

## 2018-03-16 ENCOUNTER — Other Ambulatory Visit: Payer: Self-pay | Admitting: Urgent Care

## 2018-03-16 ENCOUNTER — Inpatient Hospital Stay: Payer: Medicaid Other

## 2018-03-16 DIAGNOSIS — C20 Malignant neoplasm of rectum: Secondary | ICD-10-CM | POA: Diagnosis not present

## 2018-03-16 DIAGNOSIS — Z95828 Presence of other vascular implants and grafts: Secondary | ICD-10-CM

## 2018-03-16 DIAGNOSIS — E876 Hypokalemia: Secondary | ICD-10-CM

## 2018-03-16 MED ORDER — CYANOCOBALAMIN 1000 MCG/ML IJ SOLN
1000.0000 ug | Freq: Once | INTRAMUSCULAR | Status: AC
  Administered 2018-03-16: 1000 ug via INTRAMUSCULAR

## 2018-03-16 MED ORDER — SODIUM CHLORIDE 0.9% FLUSH
10.0000 mL | Freq: Once | INTRAVENOUS | Status: AC
  Administered 2018-03-16: 10 mL via INTRAVENOUS
  Filled 2018-03-16: qty 10

## 2018-03-16 MED ORDER — HEPARIN SOD (PORK) LOCK FLUSH 100 UNIT/ML IV SOLN
500.0000 [IU] | Freq: Once | INTRAVENOUS | Status: AC
  Administered 2018-03-16: 500 [IU] via INTRAVENOUS

## 2018-03-23 ENCOUNTER — Inpatient Hospital Stay: Payer: Medicaid Other

## 2018-03-23 DIAGNOSIS — C20 Malignant neoplasm of rectum: Secondary | ICD-10-CM | POA: Diagnosis not present

## 2018-03-23 DIAGNOSIS — E876 Hypokalemia: Secondary | ICD-10-CM

## 2018-03-23 MED ORDER — CYANOCOBALAMIN 1000 MCG/ML IJ SOLN
1000.0000 ug | Freq: Once | INTRAMUSCULAR | Status: AC
  Administered 2018-03-23: 1000 ug via INTRAMUSCULAR

## 2018-03-30 ENCOUNTER — Inpatient Hospital Stay: Payer: Medicaid Other

## 2018-03-30 DIAGNOSIS — C20 Malignant neoplasm of rectum: Secondary | ICD-10-CM | POA: Diagnosis not present

## 2018-03-30 DIAGNOSIS — E876 Hypokalemia: Secondary | ICD-10-CM

## 2018-03-30 MED ORDER — CYANOCOBALAMIN 1000 MCG/ML IJ SOLN
1000.0000 ug | Freq: Once | INTRAMUSCULAR | Status: AC
Start: 2018-03-30 — End: 2018-03-30
  Administered 2018-03-30: 1000 ug via INTRAMUSCULAR

## 2018-04-27 ENCOUNTER — Inpatient Hospital Stay: Payer: Self-pay | Attending: Hematology and Oncology

## 2018-04-27 ENCOUNTER — Inpatient Hospital Stay: Payer: Self-pay

## 2018-04-27 DIAGNOSIS — Z95828 Presence of other vascular implants and grafts: Secondary | ICD-10-CM

## 2018-04-27 DIAGNOSIS — C20 Malignant neoplasm of rectum: Secondary | ICD-10-CM | POA: Insufficient documentation

## 2018-04-27 DIAGNOSIS — E876 Hypokalemia: Secondary | ICD-10-CM

## 2018-04-27 DIAGNOSIS — C7802 Secondary malignant neoplasm of left lung: Secondary | ICD-10-CM | POA: Insufficient documentation

## 2018-04-27 DIAGNOSIS — C787 Secondary malignant neoplasm of liver and intrahepatic bile duct: Secondary | ICD-10-CM | POA: Insufficient documentation

## 2018-04-27 MED ORDER — CYANOCOBALAMIN 1000 MCG/ML IJ SOLN
1000.0000 ug | Freq: Once | INTRAMUSCULAR | Status: AC
  Administered 2018-04-27: 1000 ug via INTRAMUSCULAR

## 2018-04-27 MED ORDER — SODIUM CHLORIDE 0.9% FLUSH
10.0000 mL | Freq: Once | INTRAVENOUS | Status: AC
  Administered 2018-04-27: 10 mL via INTRAVENOUS
  Filled 2018-04-27: qty 10

## 2018-04-27 MED ORDER — HEPARIN SOD (PORK) LOCK FLUSH 100 UNIT/ML IV SOLN
500.0000 [IU] | Freq: Once | INTRAVENOUS | Status: AC
  Administered 2018-04-27: 500 [IU] via INTRAVENOUS

## 2018-05-10 ENCOUNTER — Other Ambulatory Visit: Payer: Self-pay

## 2018-05-10 ENCOUNTER — Emergency Department
Admission: EM | Admit: 2018-05-10 | Discharge: 2018-05-10 | Disposition: A | Discharge status: Home / Self Care | Payer: Self-pay | Attending: Emergency Medicine | Admitting: Emergency Medicine

## 2018-05-10 ENCOUNTER — Emergency Department: Payer: Self-pay

## 2018-05-10 ENCOUNTER — Encounter: Payer: Self-pay | Admitting: Emergency Medicine

## 2018-05-10 DIAGNOSIS — F17210 Nicotine dependence, cigarettes, uncomplicated: Secondary | ICD-10-CM | POA: Insufficient documentation

## 2018-05-10 DIAGNOSIS — Y92018 Other place in single-family (private) house as the place of occurrence of the external cause: Secondary | ICD-10-CM | POA: Insufficient documentation

## 2018-05-10 DIAGNOSIS — S60212A Contusion of left wrist, initial encounter: Secondary | ICD-10-CM | POA: Insufficient documentation

## 2018-05-10 DIAGNOSIS — Z79899 Other long term (current) drug therapy: Secondary | ICD-10-CM | POA: Insufficient documentation

## 2018-05-10 DIAGNOSIS — Y9389 Activity, other specified: Secondary | ICD-10-CM | POA: Insufficient documentation

## 2018-05-10 DIAGNOSIS — W19XXXA Unspecified fall, initial encounter: Secondary | ICD-10-CM | POA: Insufficient documentation

## 2018-05-10 DIAGNOSIS — I10 Essential (primary) hypertension: Secondary | ICD-10-CM | POA: Insufficient documentation

## 2018-05-10 DIAGNOSIS — Y999 Unspecified external cause status: Secondary | ICD-10-CM | POA: Insufficient documentation

## 2018-05-10 MED ORDER — HYDROCODONE-ACETAMINOPHEN 5-325 MG PO TABS: 1.0000 | ORAL_TABLET | Freq: Four times a day (QID) | ORAL | 0 refills | Status: DC | PRN

## 2018-05-10 NOTE — ED Provider Notes (Signed)
[facility] Emergency Department Provider Note  ____________________________________________   First MD Initiated Contact with Patient 05/10/18 1042     (approximate)  I have reviewed the triage vital signs and the nursing notes.   HISTORY  Chief Complaint Fall   HPI Tamara Hall is a 62 y.o. female presents to the emergency department with complaint of left wrist pain.  Patient states that she fell last evening after getting up out of bed.  She states that she was moving a chair to the side but did not pull it out completely and it stopped.  This caused patient to fall over the chair.  She denies any head injury or loss of consciousness.  She denies any previous injury to her wrist.  She rates her pain as an 8 out of 10.   Past Medical History:  Diagnosis Date  . Cancer ([place])    colon  . Hypertension   . Pneumonia    20+ years ago    Patient Active Problem List   Diagnosis Date Noted  . Diarrhea 07/11/2017  . Hypokalemia 07/08/2017  . 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 Nausea without vomiting 05/06/2017  . Liver metastases ([place]) 02/25/2017  . Goals of care, counseling/discussion 02/18/2017  . Encounter for antineoplastic chemotherapy 02/18/2017  . Cancer related pain 02/16/2017  . Tobacco use 02/06/2017  . Abnormal findings-gastrointestinal tract   . Rectum neoplasm   . Rectal bleeding 01/13/2017  . Rectal cancer [facility])     Past Surgical History:  Procedure Laterality Date  . ABDOMINAL HYSTERECTOMY    . BREAST SURGERY    . COLONOSCOPY WITH PROPOFOL N/A 01/14/2017   Procedure: COLONOSCOPY WITH PROPOFOL;  Surgeon: Lucilla Lame, MD;  Location: [facility] ENDOSCOPY;  Service: Endoscopy;  Laterality: N/A;  . ELBOW SURGERY Right    Has had 4 surgeries on right elbow.  Marland Kitchen HAND SURGERY Right 2008  . JOINT REPLACEMENT    . PORTA CATH INSERTION N/A 01/29/2017   Procedure: Glori Luis Cath Insertion;  Surgeon: Algernon Huxley, MD;   Location: [place] CV LAB;  Service: Cardiovascular;  Laterality: N/A;  . TONSILLECTOMY      Prior to Admission medications   Medication Sig Start Date End Date Taking? Authorizing Provider  albuterol (PROVENTIL HFA;VENTOLIN HFA) 108 (90 Base) MCG/ACT inhaler Inhale 2 puffs into the lungs every 6 (six) hours as needed for wheezing or shortness of breath. 06/30/17   Little, Traci M, PA-C  calcium-vitamin D (OSCAL WITH D) 500-200 MG-UNIT tablet Take 1 tablet by mouth daily with breakfast.    [provider]  capecitabine (XELODA) 150 MG tablet Take one 150 mg pill twice a day for 5 days each week during radiation with three 500 mg tablets Patient not taking: Reported on 02/02/2018 05/20/17   Lequita Asal, MD  capecitabine (XELODA) 500 MG tablet Take three  500 mg pills twice a day for 5 days each week during radiation with one 150 mg tablet Patient not taking: Reported on 02/02/2018 05/20/17   Lequita Asal, MD  gabapentin (NEURONTIN) 300 MG capsule TAKE 1 CAPSULE BY MOUTH THREE TIMES A DAY 03/16/18   Karen Kitchens, NP  HYDROcodone-acetaminophen (NORCO/VICODIN) 5-325 MG tablet Take 1 tablet by mouth every 6 (six) hours as needed for moderate pain. 05/10/18   Johnn Hai, PA-C  lidocaine-prilocaine (EMLA) cream APPLY A SMALL AMOUNT TO PORT SITE AT LEAST 1 HOUR BEFORE BEING ACCESSED. COVER IN PLASTIC WRAP.  Patient not taking: Reported on 02/02/2018 06/17/17   Lequita Asal, MD  LORazepam (ATIVAN) 0.5 MG tablet Take 1 tablet (0.5 mg total) by mouth every 6 (six) hours as needed. For nausea or vomiting Patient not taking: Reported on 02/18/2018 05/01/17   Lequita Asal, MD  Magnesium Hydroxide (MILK OF MAGNESIA PO) Take 30 mLs by mouth at bedtime.     [provider]  omeprazole (PRILOSEC) 20 MG capsule Take 1 capsule (20 mg total) by mouth daily. 02/18/18   Karen Kitchens, NP  ondansetron (ZOFRAN) 8 MG tablet Take 1 tablet (8 mg total) by mouth every 8 (eight)  hours as needed. 04/22/17   Lequita Asal, MD  potassium chloride SA (K-DUR,KLOR-CON) 20 MEQ tablet Take 1 tablet (20 mEq total) by mouth daily. Patient not taking: Reported on 02/02/2018 07/08/17   Lequita Asal, MD    Allergies Patient has no known allergies.  Family History  Problem Relation Age of Onset  . Lung cancer Mother   . Lung cancer Father   . Brain cancer Maternal Grandmother     Social History Social History   Tobacco Use  . Smoking status: Current Every Day Smoker    Packs/day: 2.00    Years: 40.00    Pack years: 80.00    Types: Cigarettes  . Smokeless tobacco: Never Used  Substance Use Topics  . Alcohol use: No  . Drug use: No    Review of Systems Constitutional: No fever/chills Eyes: No visual changes. ENT: No trauma. Cardiovascular: Denies chest pain. Respiratory: Denies shortness of breath. Musculoskeletal: Positive for left wrist pain. Skin: Negative for rash. Neurological: Negative for  focal weakness or numbness. ____________________________________________   PHYSICAL EXAM:  VITAL SIGNS: ED Triage Vitals [05/10/18 1017]  Enc Vitals Group     BP      Pulse      Resp      Temp      Temp src      SpO2      Weight 180 lb (81.6 kg)     Height 5\' 7"  (1.702 m)     Head Circumference      Peak Flow      Pain Score 8     Pain Loc      Pain Edu?      Excl. in [place]?    Constitutional: Alert and oriented. Well appearing and in no acute distress. Eyes: Conjunctivae are normal.  Head: Atraumatic. Neck: No stridor.   Cardiovascular: Normal rate, regular rhythm. Grossly normal heart sounds.  Good peripheral circulation. Respiratory: Normal respiratory effort.  No retractions. Lungs CTAB. Musculoskeletal: Left wrist is mildly tender to palpation.  There is soft tissue edema present.  No deformity.  Range of motion is restricted secondary to discomfort.  Skin is intact.  No ecchymosis or abrasions were seen.  Motor or sensory function  distal to the injury is intact.  Patient is able to make a complete fist without difficulty. Neurologic:  Normal speech and language. No gross focal neurologic deficits are appreciated.  Skin:  Skin is warm, dry and intact.  No ecchymosis or abrasions were seen. Psychiatric: Mood and affect are normal. Speech and behavior are normal.  ____________________________________________   LABS (all labs ordered are listed, but only abnormal results are displayed)  Labs Reviewed - No data to display  RADIOLOGY  ED MD interpretation:  Left wrist x-ray is negative for fracture.  Official radiology report(s): Dg Wrist Complete Left  Result Date: 05/10/2018 CLINICAL DATA:  Pt fell last night after getting up out of bed to get some cereal. Pt states she was moving a chair to the side, that stopped and patient fell over the chair. C/O medial left wrist pain. Pt states no prior injuries or surgeries to L wrist EXAM: LEFT WRIST - COMPLETE 3+ VIEW COMPARISON:  None. FINDINGS: There is no evidence of fracture or dislocation. There is no evidence of arthropathy or other focal bone abnormality. Soft tissues are unremarkable. IMPRESSION: Negative. Electronically Signed   By: Nolon Nations M.D.   On: 05/10/2018 11:25    ____________________________________________   PROCEDURES  Procedure(s) performed: None  Procedures  Critical Care performed: No  ____________________________________________   INITIAL IMPRESSION / ASSESSMENT AND PLAN / ED COURSE  As part of my medical decision making, I reviewed the following data within the electronic MEDICAL RECORD NUMBER Notes from prior ED visits and [place] Controlled Substance Database  Patient was made aware that x-ray is negative for fracture.  She is placed in a cock-up wrist splint for support.  She is to ice and elevate her wrist as needed for discomfort and swelling.  She was given a prescription for Norco 1 every 6 hours as needed for pain.  She is to  follow-up with her PCP or [facility] acute care if any continued problems.  ____________________________________________   FINAL CLINICAL IMPRESSION(S) / ED DIAGNOSES  Final diagnoses:  Contusion of left wrist, initial encounter  Fall, initial encounter     ED Discharge Orders        Ordered    HYDROcodone-acetaminophen (NORCO/VICODIN) 5-325 MG tablet  Every 6 hours PRN     05/10/18 1159       Note:  This document was prepared using Dragon voice recognition software and may include unintentional dictation errors.    Johnn Hai, PA-C 05/10/18 1339    Delman Kitten, MD 05/10/18 [phone]

## 2018-05-10 NOTE — ED Triage Notes (Signed)
Fell last night after getting up out of bed to get some cereal.  States was moving a chair to the side, that stopped and patient fell over the chair.  C/O left wrist pain.

## 2018-05-10 NOTE — Discharge Instructions (Addendum)
Ice and elevate your wrist as needed for swelling and pain.  Wear wrist splint for protection and support.  Take Norco every 6 hours as needed for pain.  Follow-up with your primary care provider if any continued problems or [facility] acute care.

## 2018-05-10 NOTE — ED Notes (Signed)
See triage note Pt c/o left wrist pain after fall

## 2018-05-20 ENCOUNTER — Inpatient Hospital Stay: Payer: Self-pay | Admitting: Urgent Care

## 2018-05-20 ENCOUNTER — Inpatient Hospital Stay: Payer: Self-pay

## 2018-05-20 NOTE — Progress Notes (Deleted)
[facility] day:  05/20/2018   Chief Complaint: Tamara Hall is a 62 y.o. female with stage IV rectal cancer s/p microwave hepatic ablation, APR with colostomy, and VATS wedge resection who is seen for a 3 month Tamara Hall.    HPI:  The patient was last seen in the medical oncology clinic on 02/18/2018.  At that time, patient was doing "okay".  Pain continued to improve status post recent APR with colostomy and VATS wedge resection.  She denied any acute complaints.  Appetite "okay"; weight stable.  Exam was grossly unremarkable be on surgical site tenderness.  Hemoglobin stable at 10.3.  CEA 3.7 (previously 9.6).  Additional lab studies were ordered.  Additional lab studies were done on 02/18/2018 revealing a ferritin of 14.  Iron saturation 11% with a TIBC of 424.  Folate normal at 12.9 ng/mL.  B12 low at 122.  Reticulocyte 1.7%.  TSH normal at 1.430 uIU/mL.  Patient was contacted by clinical staff to review lab results.  Additionally she was made aware of need to begin vitamin B12 supplementation.  Patient received weekly parenteral B12 supplementation from 02/23/2018 - 03/30/2018 (6 injections).  Patient currently receiving monthly injections, with the last being given on 04/27/2018.  Patient was seen in the ED on 05/10/2018 by Letitia Neri, PA.  Notes reviewed.  Patient sustained a mechanical fall whereby she fell over a chair.  She complained of pain in her LEFT wrist.  Diagnostic plain films demonstrated no evidence of fracture or dislocation.  Patient was placed in a cock-up wrist splint for support.  She was encouraged to ice and elevate her wrist as needed for pain and swelling.  She was prescribed a short course of Norco 5/325 mg every 6 hours as needed and advised to follow-up with her PCP or [facility] acute care for any continued problems.  Patient was seen in follow-up consult on 03/26/2018 by Dr. Rolla Etienne [facility] surgical oncology).   Notes reviewed.  Patient continued to complain of intermittent abdominal pain that was described to be similar to "labor pain".  She denied any recent infections or fevers.  Increased ostomy output required patient to be changing her bag 5-6 times a day.  Patient complained of increasing gas in her ostomy bag.  Complaints gave rise to clinical concerns for SIBO.  Hydrogen breath test was ordered. Results returned negative for bacterial overgrowth.  Patient was started on loperamide 2 mg 4 times daily as needed, gabapentin 900 mg 3 times daily, and referred to pain management Carola Rhine, MD).  Patient to follow-up with surgical oncology in 6 months.  Patient was seen in consult on 04/16/2018 by Dr. Vance Gather [facility] pain management).  Notes reviewed.  Patient to continue on previously prescribed gabapentin and duloxetine.  UDS was performed and found to be negative.  She was started on baclofen 10 mg 3 times daily and Norco 5/325 mg every 6 hours as needed.  Patient scheduled to follow-up with pain management in 1 month.    Patient was seen in follow-up by pain management Carola Rhine, MD) on 05/15/2018.  Notes reviewed. Pain was persistent despite previous interventions.  Norco was increased from 5 mg to 7.5 mg every 6 hours as needed.  Discussed potential change in therapy from gabapentin to pregabalin at next visit if pain has not improved.  Patient to follow-up again with pain management in 1 month.  In the interim,  Past Medical History:  Diagnosis Date  .  Cancer ([place])    colon  . Hypertension   . Pneumonia    20+ years ago    Past Surgical History:  Procedure Laterality Date  . ABDOMINAL HYSTERECTOMY    . BREAST SURGERY    . COLONOSCOPY WITH PROPOFOL N/A 01/14/2017   Procedure: COLONOSCOPY WITH PROPOFOL;  Surgeon: Lucilla Lame, MD;  Location: [facility] ENDOSCOPY;  Service: Endoscopy;  Laterality: N/A;  . ELBOW SURGERY Right    Has had 4 surgeries on right elbow.  Marland Kitchen HAND SURGERY Right 2008  . JOINT  REPLACEMENT    . PORTA CATH INSERTION N/A 01/29/2017   Procedure: Glori Luis Cath Insertion;  Surgeon: Algernon Huxley, MD;  Location: [facility] [place] [facility] LAB;  Service: Cardiovascular;  Laterality: N/A;  . TONSILLECTOMY      Family History  Problem Relation Age of Onset  . Lung cancer Mother   . Lung cancer Father   . Brain cancer Maternal Grandmother     Social History:  reports that she has been smoking cigarettes. She has a 80.00 pack-year smoking history. She has never used smokeless tobacco. She reports that she does not drink alcohol or use drugs.  She was smoking 1/2 packs/day.  She stopped smoking 10/24/2017.  She lives in [place].  She has 3 children (ages 42, 60, and 42).  She smoked 2 packs a day.  She started smoking at age 36. Stopped smoking on 10/24/2017.  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  [name] is her significant other.  She is alone today.  Allergies: No Known Allergies  Current Medications: Current Outpatient Medications  Medication Sig Dispense Refill  . albuterol (PROVENTIL HFA;VENTOLIN HFA) 108 (90 Base) MCG/ACT inhaler Inhale 2 puffs into the lungs every 6 (six) hours as needed for wheezing or shortness of breath. 1 Inhaler 2  . calcium-vitamin D (OSCAL WITH D) 500-200 MG-UNIT tablet Take 1 tablet by mouth daily with breakfast.    . capecitabine (XELODA) 150 MG tablet Take one 150 mg pill twice a day for 5 days each week during radiation with three 500 mg tablets (Patient not taking: Reported on 02/02/2018) 50 tablet 0  . capecitabine (XELODA) 500 MG tablet Take three  500 mg pills twice a day for 5 days each week during radiation with one 150 mg tablet (Patient not taking: Reported on 02/02/2018) 150 tablet 0  . gabapentin (NEURONTIN) 300 MG capsule TAKE 1 CAPSULE BY MOUTH THREE TIMES A DAY 90 capsule 0  . HYDROcodone-acetaminophen (NORCO/VICODIN) 5-325 MG tablet Take 1 tablet by mouth every 6 (six) hours as needed for  moderate pain. 15 tablet 0  . lidocaine-prilocaine (EMLA) cream APPLY A SMALL AMOUNT TO PORT SITE AT LEAST 1 HOUR BEFORE BEING ACCESSED. COVER IN PLASTIC WRAP. (Patient not taking: Reported on 02/02/2018) 30 g 1  . LORazepam (ATIVAN) 0.5 MG tablet Take 1 tablet (0.5 mg total) by mouth every 6 (six) hours as needed. For nausea or vomiting (Patient not taking: Reported on 02/18/2018) 30 tablet 1  . Magnesium Hydroxide (MILK OF MAGNESIA PO) Take 30 mLs by mouth at bedtime.     Marland Kitchen omeprazole (PRILOSEC) 20 MG capsule Take 1 capsule (20 mg total) by mouth daily. 90 capsule 1  . ondansetron (ZOFRAN) 8 MG tablet Take 1 tablet (8 mg total) by mouth every 8 (eight) hours as needed. 30 tablet 2  . potassium chloride SA (K-DUR,KLOR-CON) 20 MEQ tablet Take 1 tablet (20  mEq total) by mouth daily. (Patient not taking: Reported on 02/02/2018) 20 tablet 0   No current facility-administered medications for this visit.     Review of Systems  Constitutional: Negative for diaphoresis, fever, malaise/fatigue and weight loss.       Chronic pain issues; under the care of pain management services at [facility].  HENT: Negative.   Eyes: Negative.   Respiratory: Positive for shortness of breath (exertional). Negative for cough, hemoptysis and sputum production.   Cardiovascular: Negative for chest pain, palpitations, orthopnea, leg swelling and PND.  Gastrointestinal: Positive for abdominal pain and heartburn (on PPI). Negative for blood in stool, constipation, diarrhea, melena, nausea and vomiting.       Recent APR with colostomy.  Genitourinary: Negative for dysuria, frequency, hematuria and urgency.  Musculoskeletal: Negative for back pain, falls, joint pain and myalgias.  Skin: Negative for itching and rash.       [place]  Neurological: Negative for dizziness, tremors, weakness and headaches.  Endo/Heme/Allergies: Does not bruise/bleed easily.  Psychiatric/Behavioral: Negative for depression, memory loss and suicidal ideas.  The patient is not nervous/anxious and does not have insomnia.   All other systems reviewed and are negative.  Performance status (ECOG): 1 - Symptomatic but completely ambulatory  Vital Signs There were no vitals taken for this visit.  Physical Exam  Constitutional: She is oriented to person, place, and time and well-developed, well-nourished, and in no distress.  HENT:  Head: Normocephalic and atraumatic.  Chanelle Hodsdon hair.  Eyes: Pupils are equal, round, and reactive to light. EOM are normal. No scleral icterus.  Blue eyes.  Neck: Normal range of motion. Neck supple. No tracheal deviation present. No thyromegaly present.  Cardiovascular: Normal rate, regular rhythm and normal heart sounds. Exam reveals no gallop and no friction rub.  No murmur heard. Pulmonary/Chest: Effort normal and breath sounds normal. No respiratory distress. She has no wheezes. She has no rales.  Abdominal: Soft. Bowel sounds are normal. She exhibits no distension. There is no hepatosplenomegaly. There is generalized tenderness.  Colostomy in the left abdomen.  Healed midline abdominal incision.  Healed right sided ostomy incision.  Musculoskeletal: Normal range of motion. She exhibits no edema or tenderness.  Lymphadenopathy:    She has no cervical adenopathy.    She has no axillary adenopathy.       Right: No inguinal and no supraclavicular adenopathy present.       Left: No inguinal and no supraclavicular adenopathy present.  Neurological: She is alert and oriented to person, place, and time.  Skin: Skin is warm and dry. No rash noted. No erythema.  Psychiatric: Mood, affect and judgment normal.  Nursing note and vitals reviewed.   Imaging studies: 01/13/2017:  Abdomen and pelvic CT revealed irregular thickening of the rectum suspicious for carcinoma.  There was a 1.6 cm hypoattenuating lesion in the right hepatic lobe worrisome for metastatic disease.   01/15/2017:  Chest CT revealed a 9 mm noncalcified left  lower lobe pulmonary nodule (new). 01/16/2017:  Pelvic MRI revealed rectal adenocarcinoma T3N1.  There was extension beyond the muscularis propria (17 mm).  There were mesorectal nodes >= 5 mm.  There was adenopathy on the left side of the mesorectum, including a 9 mm lesion.  The distance from the tumor to the anal sphincter was 8 mm. 01/27/2017:  PET scan revealed intense metabolic activity associated rectal mass consists with primary rectal carcinoma.  There was a hypermetabolic metastatic lesion to the central LEFT hepatic lobe.  There  was metabolic activity associated with small LEFT lower lobe nodule which was most consistent with pulmonary metastasis. 01/31/2017:  Liver MRI revealed a 2.6 x 2.7 x 2.1 cm lesion in segment VIII of the liver near the junction with segment IVa c/w a metastatic lesion.  There was a 1.1 x 0.8 cm suspected metastatic lesion peripherally in segment VI of the liver.   05/16/2017:  Abdomen and pelvic CT revealed persistent but decreased asymmetric wall thickening in the rectum.  The medial segment left liver lesion was smaller (2.7 x 2.6 cm to 1.9 x 1.6 cm). A tiny lesion seen in segment VI of the liver on previous MRI was not evident on today's CT scan.  The left lower lobe pulmonary nodule was smaller (9 mm to 6 mm).  There was no new or progressive findings in the abdomen and pelvis. 08/19/2017:  Chest CT with contrast on 08/19/2017 revealed left lower lobe 6 mm solid pulmonary nodule, decreased since 01/15/2017 chest CT. Stable 7 mm right lower lobe pulmonary nodule.  There was no new or progressive metastatic disease in the chest.   09/03/2018:  Pelvic MRI at [facility] demonstrated a partial response of the primary tumor and extramural disease. Post treatment category ymrT T3b. There was an overall decrease in size in the previously seen perirectal node. Hypointense changes at the site of prior tumor focally contacedt buts does not definitely invade the LEFT levator ani. Tumor  contacted, but does not definitely invade the internal anal sphincter. No evidence of residual or recurrent hepatic disease following microwave ablation.  02/17/2018:  Abdomen and pelvic CT revealed evolution of the ablation zone in segment 4A, now measuring 3.7 x 3.1 cm, decreased. There was no findings to suggest viable tumor.  She is s/p abdominoperineal resection with left lower quadrant colostomy.  There was no evidence of new/progressive metastatic disease.  She is s/p left lower lobe pulmonary wedge resection, incompletely visualized.   No visits with results within 3 Day(s) from this visit.  Latest known visit with results is:  Office Visit on 02/18/2018  Component Date Value Ref Range Status  . TSH 02/18/2018 1.430  0.350 - 4.500 uIU/mL Final   Comment: Performed by a 3rd Generation assay with a functional sensitivity of <=0.01 uIU/mL. Performed at [facility] [address], [place], [place] [address]   . Retic Ct Pct 02/18/2018 1.7  0.4 - 3.1 % Final  . RBC. 02/18/2018 4.09  3.80 - 5.20 MIL/uL Final  . Retic Count, Absolute 02/18/2018 69.5  19.0 - 183.0 K/uL Final   Performed at [facility], [address] [place], [place] [address]  . Vitamin B-12 02/18/2018 122* 180 - 914 pg/mL Final   Comment: (NOTE) This assay is not validated for testing neonatal or myeloproliferative syndrome specimens for Vitamin B12 levels. Performed at [facility], [place] [address] [place], [place] [address]   . Folate 02/18/2018 12.9  >5.9 ng/mL Final   Performed at [facility], [address] [place], [place] [address]  . Iron 02/18/2018 48  28 - 170 ug/dL Final  . TIBC 02/18/2018 424  250 - 450 ug/dL Final  . Saturation Ratios 02/18/2018 11  10.4 - 31.8 % Final  . UIBC 02/18/2018 376  ug/dL Final   Performed at [facility], [address], [place], [place] [address]  . Ferritin 02/18/2018 14  11 - 307 ng/mL Final   Performed at [facility], [place],  [place], [place] [address]  . CEA 02/18/2018 3.7  0.0 - 4.7 ng/mL Final   Comment: (NOTE)                             Nonsmokers          <3.9                             Smokers             <5.6 Roche [employer] Electrochemiluminescence Immunoassay (ECLIA) Values obtained with different assay methods or kits cannot be used interchangeably.  Results cannot be interpreted as absolute evidence of the presence or absence of malignant disease. Performed At: [facility], [place] [id number] Rush Farmer MD CZ:[id number] Performed at [facility], [address], [place], [place] [address]     Tamara Hall:  KAYCIE PEGUES is a 62 y.o. female with clinical stage T3N1M1 rectal cancer.   She presented with a 9 month history of rectal bleeding.  Colonoscopy on 01/14/2017 revealed a frond-like/villous non-obstructing large mass in the rectum. The mass was non-circumferential.  Biopsies revealed high grade dysplasia within an adenoma (? sampling error).  CEA was 31.6 on 01/15/2017.  PET scan on 01/27/2017 revealed intense metabolic activity associated rectal mass consists with primary rectal carcinoma.  There was a hypermetabolic metastatic lesion to the central LEFT hepatic lobe.  There was metabolic activity associated with small LEFT lower lobe nodule which was most consistent with pulmonary metastasis.  CT guided liver biopsy at [facility] on 02/07/2017 revealed malignant cells c/w metastatic colorectal adenocarcinoma.  Tumor was sent for  MMR/MSI, KRAS/NRAS, and BRAF.  She is scheduled to have liver directed therapy (ablation) at [facility] on 03/19/2017.  CEA has been followed: 31.6 on 01/15/2017, 38.1 on 02/18/2017, 25.1 on 03/18/2017, 11.8 on 04/08/2017, 9.2 on 04/22/2017, 9.5 on 05/20/2017, and 9.6 on 06/19/2017.  She received 2 cycles of FOLFOX  (02/18/2017 - 03/04/2017).  She developed transient cold induced neuropathy.  She  developed wheezing and hypoxia with cycle #2 felt secondary to oxaliplatin.  She received 4 cycles of FOLFIRI (03/24/2017 - 04/22/2017) with Neulasta support.  Diarrhea was controlled with Imodium.  She underwent CT guided microwave ablation of the liver lesion on 06/06/2017.    She began radiation on 06/18/2017.  She receives daily Xeloda with radiation. Radiation and Xeloda completed on 08/15/2017.  She underwent APR with descending colostomy and VRAM flap on 10/24/2017. She underwent a VATS wedge resection to her LEFT lung. 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 Final diagnosis was pT3N0M1a rectal adenocarcinoma with metastasis to left lower lobe of the lung.   She has had post-operative pain and ostomy ischemia.  She underwent surgical ostomy revision on 11/28/2017.  Pathology showed focal mucosal erosion and fat necrosis c/w ischemia. There was extensive underlying fat necrosis. No dysplasia or carcinoma was identified. She was treated with oral Augmentin and wound packing.  She has been treated with oxycodone 5 mg po q 4 hrs prn, gabapentin 900 mg TID.  She was treated with Bactrim on 01/18/2018.  She was referred to local pain management clinic on 01/27/2018.   Abdomen and pelvic CT on 02/17/2018 revealed evolution of the ablation zone in segment 4A, now measuring 3.7 x 3.1 cm, decreased. There was no findings to suggest viable tumor.  She is s/p abdominoperineal resection with left lower quadrant colostomy.  There was no evidence of new/progressive metastatic disease.  She is s/p left lower lobe pulmonary wedge resection, incompletely visualized.    She has a normocytic anemia.  She has an 80 pack year smoking history.  She is smoking.  Symptomatically, she has chronic post-operative abdominal pain. She has a colostomy.  Exam reveals tenderness around surgical sites without evidence of infection.   Hemoglobin is 10.3.  Plan: 1. Labs today: CBC with differential, CMP, CEA 2. Rectal cancer 3. Anemia 4. B12 deficiency 5. Chronic pain 6. RTC Labs today:  CEA, ferritin, iron studies, B12, folate, retic, TSH. 2.  Discuss interval abdomen and pelvic scans.  No evidence of recurrent disease. 3.  Discuss need for chest CT give prior history of metastatic disease to the chest s/p wedge resection. 4.  Discuss anemia and planned laboratory work-up. 5.  Refill Rx for omeprazole (Disp # 90 r1) 6.  Refill Rx for gabapentin 300 mg TID (Disp # 90) 7.  Schedule port flushes today and every 6 weeks.  8.  Refer to [facility]) to discuss financial concerns related to [facility] and coverage of medications and ostomy supplies.  9.  Follow up with pain management for further evaluation and management. Referred to pain management from this clinic.  10.  Follow up with surgical oncology (Dr Michel Harrow).  Case discussed with Dr. Michel Harrow who can see patient sooner.  She will review recent imaging.  11.  RTC in 3 months for MD Tamara Hall and labs (CBC with diff, CMP, CEA).   Honor Loh, NP  05/20/2018, 7:19 AM

## 2018-05-25 ENCOUNTER — Inpatient Hospital Stay: Payer: Self-pay | Attending: Hematology and Oncology

## 2018-06-09 ENCOUNTER — Inpatient Hospital Stay: Payer: Self-pay | Attending: Hematology and Oncology

## 2018-06-14 IMAGING — MR MR PELVIS WO/W CM
7 of 9 series · 37 of 48 positions shown · IV contrast (15ML MULTIHANCE)
Comparison: Report of colonoscopy of 01/14/2017. abdominopelvic CT
of 01/13/2017.

CLINICAL DATA: Rectal mass on CT and colonoscopy.  Rectal bleeding.

EXAM:
MRI PELVIS WITHOUT AND WITH CONTRAST
TECHNIQUE: Multiplanar multisequence MR imaging of the pelvis was performed
both before and after administration of intravenous contrast. Small
amount of US gel was administered per rectum to optimize tumor
evaluation.
CONTRAST:  15mL MULTIHANCE GADOBENATE DIMEGLUMINE 529 MG/ML IV SOLN

[Series 2: T2 · sagittal · 3.0mm · 0.94mm/px · 6 of 45 slices shown (1 of 4)]
[im 1/45]
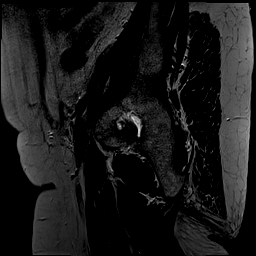
[im 9/45]
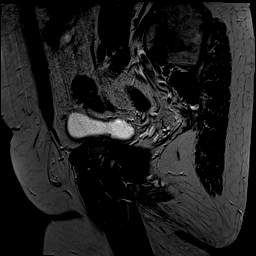
[im 18/45]
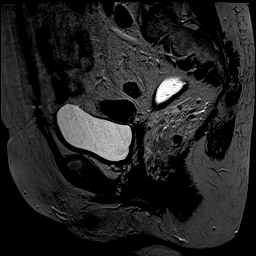
[im 27/45]
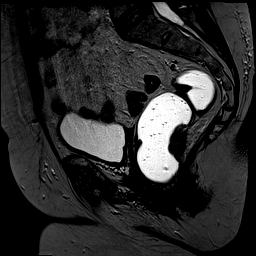
[im 36/45]
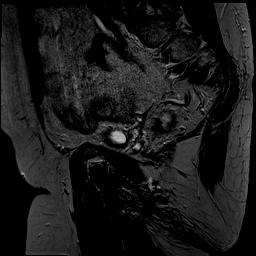
[im 45/45]
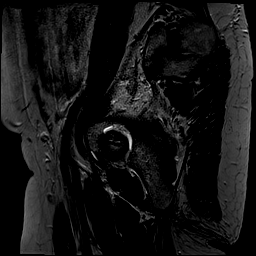

[Series 3: T2 · axial · 3.0mm · 0.47mm/px · z∈[-17,+120]mm · 6 of 47 slices shown (2 of 4)]
[im 1/47]
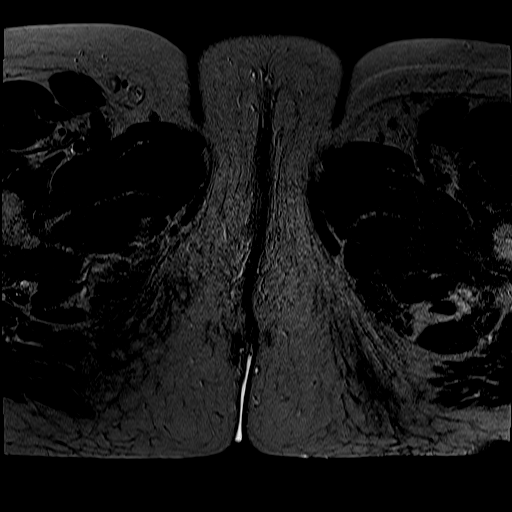
[im 10/47]
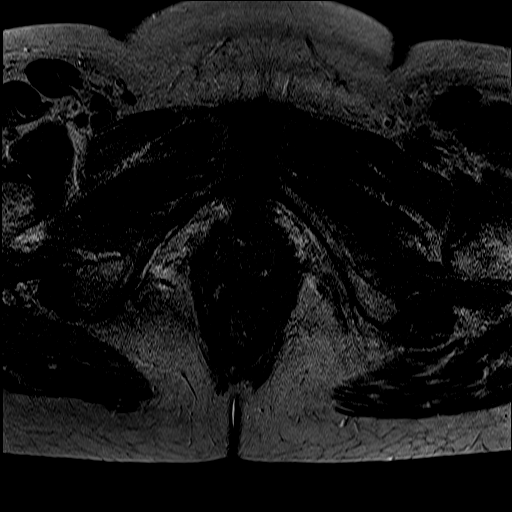
[im 19/47]
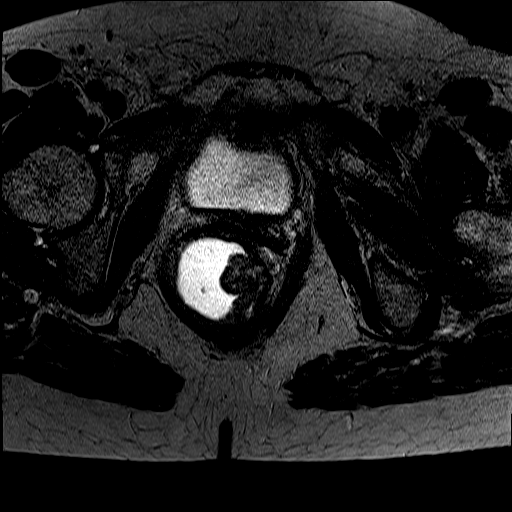
[im 28/47]
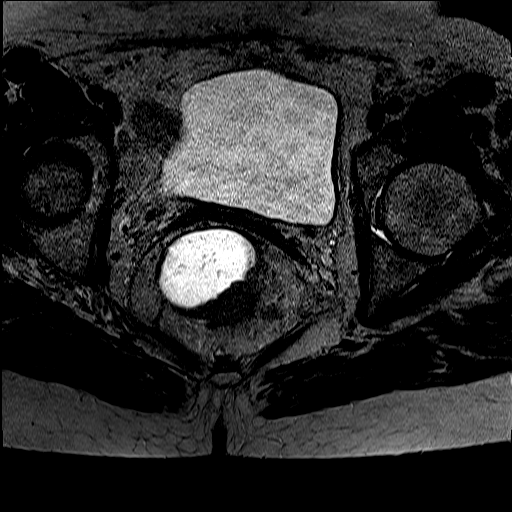
[im 37/47]
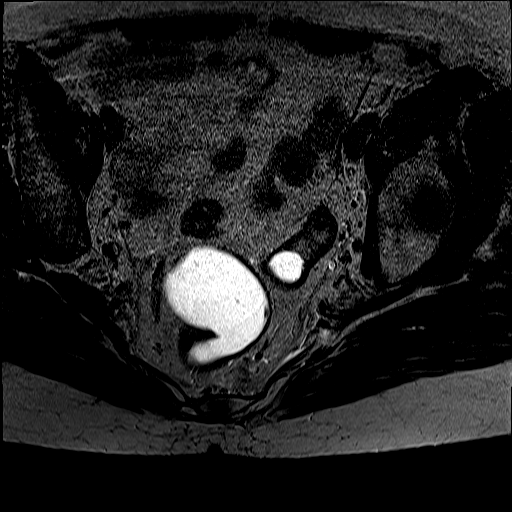
[im 47/47]
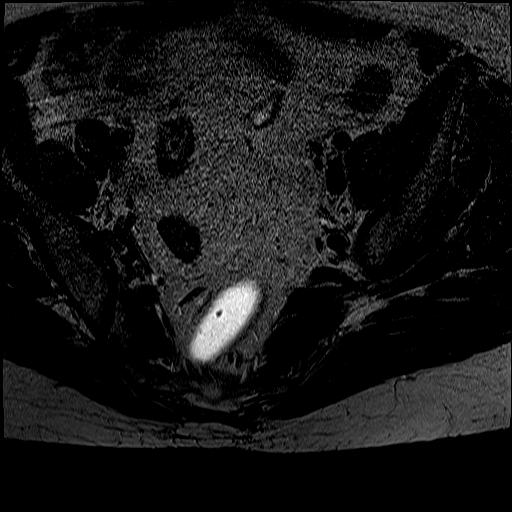

[Series 4: T2 · coronal · 3.0mm · 0.62mm/px · 4 of 33 slices shown (3 of 4)]
[im 1/33]
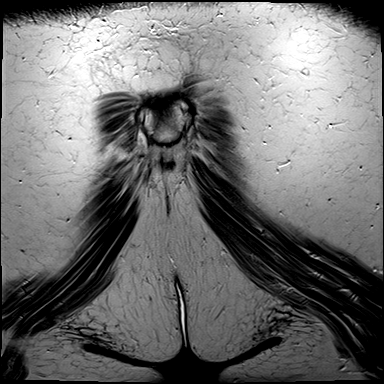
[im 11/33]
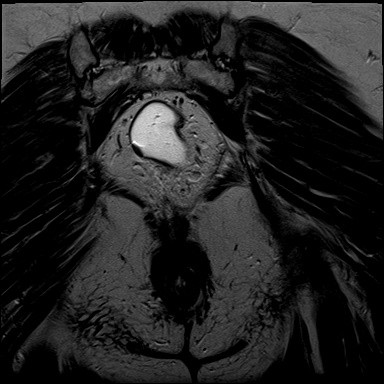
[im 22/33]
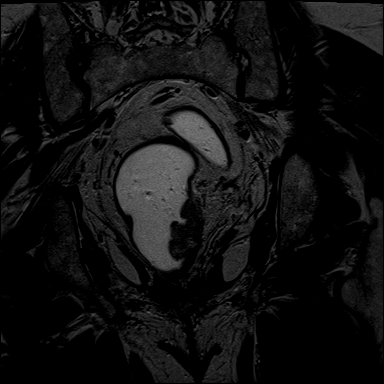
[im 33/33]
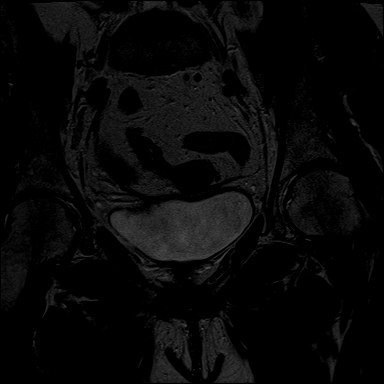

[Series 5: T1 fat-sat · axial · 5.0mm · 1.56mm/px · z∈[-69,+195]mm · 6 of 45 slices shown (1 of 3)]
[im 1/45]
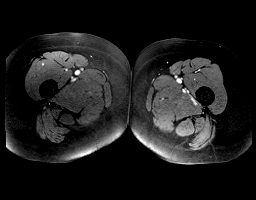
[im 9/45]
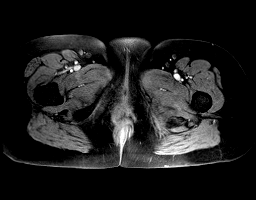
[im 18/45]
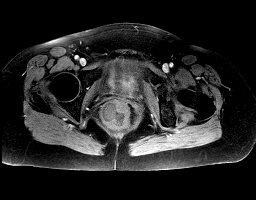
[im 27/45]
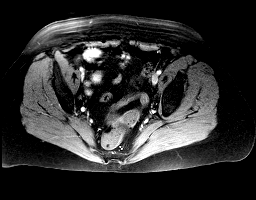
[im 36/45]
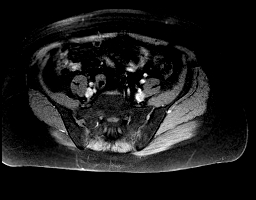
[im 45/45]
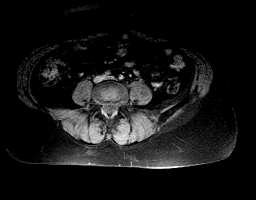

[Series 6: T2 · axial · 5.0mm · 0.78mm/px · z∈[-69,+195]mm · 6 of 45 slices shown (4 of 4)]
[im 1/45]
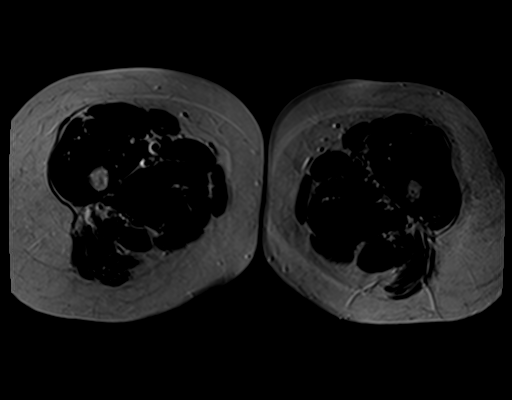
[im 9/45]
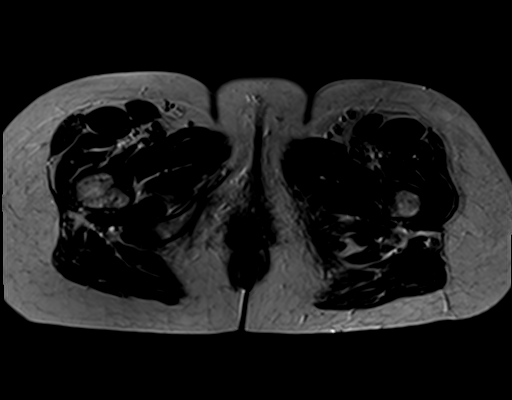
[im 18/45]
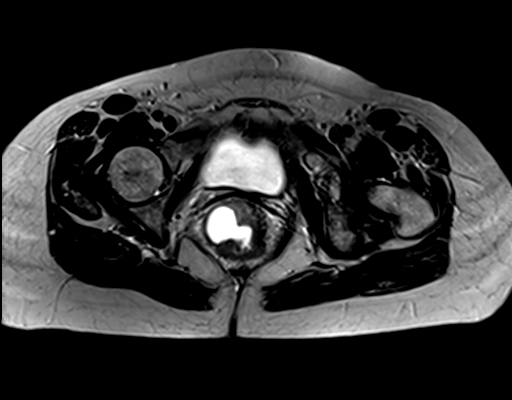
[im 27/45]
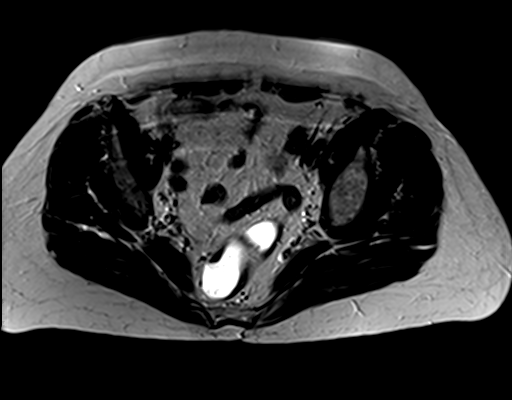
[im 36/45]
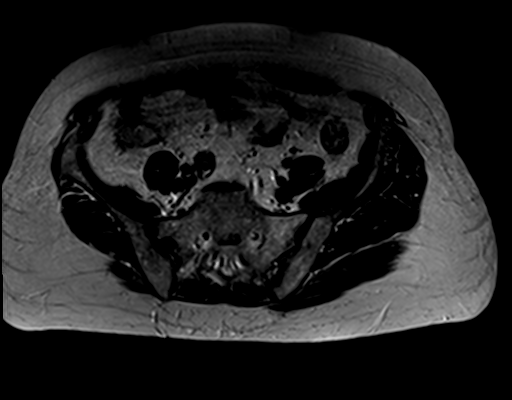
[im 45/45]
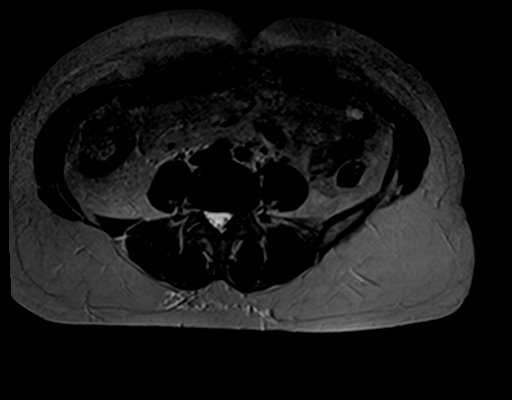

[Series 7: T1 fat-sat · axial · 5.0mm · 1.56mm/px · z∈[-69,+195]mm · 6 of 45 slices shown (2 of 3)]
[im 1/45]
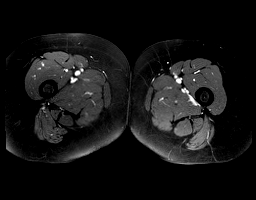
[im 9/45]
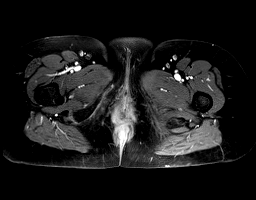
[im 18/45]
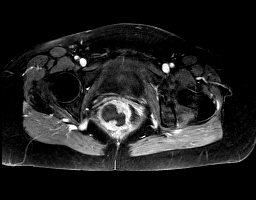
[im 27/45]
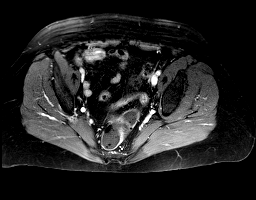
[im 36/45]
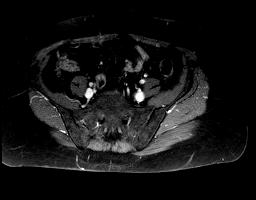
[im 45/45]
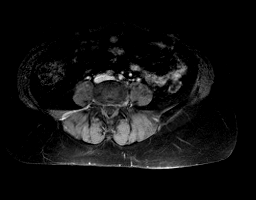

[Series 8: T1 fat-sat · axial · 3.0mm · 0.94mm/px · z∈[-19,+38]mm · 3 of 50 slices shown (3 of 3)]
[im 1/50]
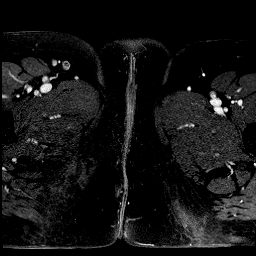
[im 10/50]
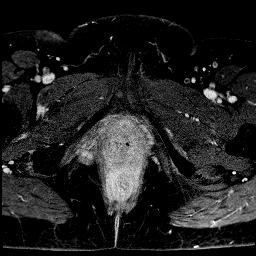
[im 20/50]
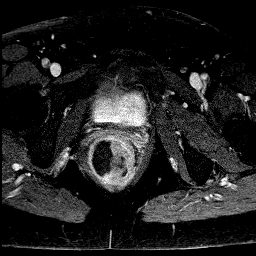

[37 of 48 positions shown; findings below may reference images not displayed]

FINDINGS: TUMOR LOCATION

Location from Anal Verge:  6.8 cm, including on image 22/series 2.

Shortest Distance from Tumor to Anal Sphincter: Not well evaluated
secondary to technique. Favored to be on the order of 8 mm,
including image 18/series 4.

TUMOR DESCRIPTION

Circumferential Extent: Covers an approximately 180 degree
circumference, centered about the 4 o'clock position. Example image
22/series 3 and image 28/series 7. Also image 20/series 8.

Craniocaudal Extent:  4.6 cm on image 22/series 2

T - CATEGORY

Extension through Muscularis Propria: Present, including at bone
image 19/series 3. T3

Maximum extension beyond Muscularis Propria:  17 mm

(early T3 = <5mm; advanced T3 = >5mm)

Extramural vascular invasion/tumor thrombus:  No

Shortest distance of any tumor/node from Mesorectal Fascia: 10 mm,
image 22/series 3

Invasion of Anterior Peritoneal Reflection:  No

Involvement of Adjacent Organs or Pelvic Sidewall Structures:  No

N - CATEGORY

Mesorectal Lymph Nodes >=5mm: Yes = N1; adenopathy in the left side
of the mesorectum, including at 9 mm on image 21/series 3

Extra-mesorectal Lymphadenopathy:  No

Other: No significant free fluid. No evidence of omental or
peritoneal disease. Normal urinary bladder. Hysterectomy. No adnexal
mass. No acute osseous abnormality. Mild pelvic floor laxity.
IMPRESSION: Rectal adenocarcinoma T stage:  T3

Rectal adenocarcinoma N stage:  N1

Distance from tumor to the anal sphincter is approximately 8 mm .

## 2018-06-22 ENCOUNTER — Inpatient Hospital Stay: Payer: Self-pay

## 2018-07-16 ENCOUNTER — Emergency Department: Payer: Self-pay

## 2018-07-16 ENCOUNTER — Other Ambulatory Visit: Payer: Self-pay

## 2018-07-16 ENCOUNTER — Emergency Department
Admission: EM | Admit: 2018-07-16 | Discharge: 2018-07-16 | Disposition: A | Discharge status: Home / Self Care | Payer: Self-pay | Attending: Emergency Medicine | Admitting: Emergency Medicine

## 2018-07-16 DIAGNOSIS — M79605 Pain in left leg: Secondary | ICD-10-CM | POA: Insufficient documentation

## 2018-07-16 DIAGNOSIS — I10 Essential (primary) hypertension: Secondary | ICD-10-CM | POA: Insufficient documentation

## 2018-07-16 DIAGNOSIS — F17210 Nicotine dependence, cigarettes, uncomplicated: Secondary | ICD-10-CM | POA: Insufficient documentation

## 2018-07-16 DIAGNOSIS — Z85038 Personal history of other malignant neoplasm of large intestine: Secondary | ICD-10-CM | POA: Insufficient documentation

## 2018-07-16 DIAGNOSIS — M5432 Sciatica, left side: Secondary | ICD-10-CM | POA: Insufficient documentation

## 2018-07-16 LAB — CBC WITH DIFFERENTIAL/PLATELET
ABS IMMATURE GRANULOCYTES: 0.28 10*3/uL — AB (ref 0.00–0.07)
Basophils Absolute: 0 10*3/uL (ref 0.0–0.1)
Basophils Relative: 0 %
Eosinophils Absolute: 0.1 10*3/uL (ref 0.0–0.5)
Eosinophils Relative: 1 %
HEMATOCRIT: 38.8 % (ref 36.0–46.0)
HEMOGLOBIN: 12.8 g/dL (ref 12.0–15.0)
Immature Granulocytes: 3 %
LYMPHS ABS: 1.1 10*3/uL (ref 0.7–4.0)
LYMPHS PCT: 12 %
MCH: 31.2 pg (ref 26.0–34.0)
MCHC: 33 g/dL (ref 30.0–36.0)
MCV: 94.6 fL (ref 80.0–100.0)
MONO ABS: 0.6 10*3/uL (ref 0.1–1.0)
Monocytes Relative: 6 %
NEUTROS ABS: 7.4 10*3/uL (ref 1.7–7.7)
Neutrophils Relative %: 78 %
Platelets: 255 10*3/uL (ref 150–400)
RBC: 4.1 MIL/uL (ref 3.87–5.11)
RDW: 14.9 % (ref 11.5–15.5)
WBC: 9.5 10*3/uL (ref 4.0–10.5)
nRBC: 0.2 % (ref 0.0–0.2)

## 2018-07-16 LAB — URINALYSIS, COMPLETE (UACMP) WITH MICROSCOPIC
BILIRUBIN URINE: NEGATIVE
Bacteria, UA: NONE SEEN
Glucose, UA: NEGATIVE mg/dL
Hgb urine dipstick: NEGATIVE
Ketones, ur: NEGATIVE mg/dL
Leukocytes, UA: NEGATIVE
NITRITE: NEGATIVE
PH: 5 (ref 5.0–8.0)
Protein, ur: NEGATIVE mg/dL
SPECIFIC GRAVITY, URINE: 1.012 (ref 1.005–1.030)
Squamous Epithelial / LPF: NONE SEEN (ref 0–5)

## 2018-07-16 LAB — COMPREHENSIVE METABOLIC PANEL
ALK PHOS: 105 U/L (ref 38–126)
ALT: 15 U/L (ref 0–44)
AST: 19 U/L (ref 15–41)
Albumin: 3.3 g/dL — ABNORMAL LOW (ref 3.5–5.0)
Anion gap: 11 (ref 5–15)
BILIRUBIN TOTAL: 0.6 mg/dL (ref 0.3–1.2)
BUN: 16 mg/dL (ref 8–23)
CALCIUM: 8.3 mg/dL — AB (ref 8.9–10.3)
CO2: 27 mmol/L (ref 22–32)
CREATININE: 0.67 mg/dL (ref 0.44–1.00)
Chloride: 100 mmol/L (ref 98–111)
Glucose, Bld: 94 mg/dL (ref 70–99)
Potassium: 3.8 mmol/L (ref 3.5–5.1)
SODIUM: 138 mmol/L (ref 135–145)
TOTAL PROTEIN: 6.5 g/dL (ref 6.5–8.1)

## 2018-07-16 LAB — TROPONIN I

## 2018-07-16 MED ORDER — LORAZEPAM 1 MG PO TABS: 1.0000 mg | ORAL_TABLET | Freq: Two times a day (BID) | ORAL | 0 refills | Status: AC

## 2018-07-16 MED ORDER — KETOROLAC TROMETHAMINE 10 MG PO TABS: 10.0000 mg | ORAL_TABLET | Freq: Four times a day (QID) | ORAL | 0 refills | Status: DC | PRN

## 2018-07-16 MED ORDER — LORAZEPAM 2 MG/ML IJ SOLN
1.0000 mg | Freq: Once | INTRAMUSCULAR | Status: AC
  Administered 2018-07-16: 1 mg via INTRAVENOUS
  Filled 2018-07-16: qty 1

## 2018-07-16 MED ORDER — HYDROMORPHONE HCL 1 MG/ML IJ SOLN
2.0000 mg | Freq: Once | INTRAMUSCULAR | Status: AC
  Administered 2018-07-16: 2 mg via INTRAVENOUS
  Filled 2018-07-16: qty 2

## 2018-07-16 MED ORDER — KETOROLAC TROMETHAMINE 30 MG/ML IJ SOLN
30.0000 mg | Freq: Once | INTRAMUSCULAR | Status: AC
  Administered 2018-07-16: 30 mg via INTRAVENOUS
  Filled 2018-07-16: qty 1

## 2018-07-16 MED ORDER — HYDROMORPHONE HCL 1 MG/ML IJ SOLN
1.0000 mg | Freq: Once | INTRAMUSCULAR | Status: AC
  Administered 2018-07-16: 1 mg via INTRAVENOUS
  Filled 2018-07-16: qty 1

## 2018-07-16 NOTE — ED Triage Notes (Signed)
Pt arrives from home via [facility] c/o L leg pain.

## 2018-07-16 NOTE — ED Notes (Signed)
Pt woken up and was able to state her name and birthday.

## 2018-07-16 NOTE — ED Provider Notes (Signed)
[facility] Emergency Department Provider Note       Time seen: ----------------------------------------- 9:22 AM on 07/16/2018 -----------------------------------------   I have reviewed the triage vital signs and the nursing notes.  HISTORY   Chief Complaint No chief complaint on file.    HPI Tamara Hall is a 62 y.o. female with a history of colon cancer, hypertension, pneumonia, lung cancer, liver metastasis who presents to the ED for worsening left hip and leg pain.  Patient describes terminal cancer that she has stopped treating over the last year.  Patient states in the last several months she has been having severe left radicular leg pain.  She has undergone MRI imaging without any specific etiology discovered.  She has tried steroids and pain medicine without relief.  She denies fevers, chills or other complaints.  Past Medical History:  Diagnosis Date  . Cancer ([place])    colon  . Hypertension   . Pneumonia    20+ years ago    Patient Active Problem List   Diagnosis Date Noted  . Diarrhea 07/11/2017  . Hypokalemia 07/08/2017  . Malignant neoplasm metastatic to left lung ([place]) 05/27/2017  . Nausea without vomiting 05/06/2017  . Liver metastases ([place]) 02/25/2017  . Goals of care, counseling/discussion 02/18/2017  . Encounter for antineoplastic chemotherapy 02/18/2017  . Cancer related pain 02/16/2017  . Tobacco use 02/06/2017  . Abnormal findings-gastrointestinal tract   . Rectum neoplasm   . Rectal bleeding 01/13/2017  . Rectal cancer [facility])     Past Surgical History:  Procedure Laterality Date  . ABDOMINAL HYSTERECTOMY    . BREAST SURGERY    . COLONOSCOPY WITH PROPOFOL N/A 01/14/2017   Procedure: COLONOSCOPY WITH PROPOFOL;  Surgeon: Lucilla Lame, MD;  Location: [facility] ENDOSCOPY;  Service: Endoscopy;  Laterality: N/A;  . ELBOW SURGERY Right    Has had 4 surgeries on right elbow.  Marland Kitchen HAND SURGERY Right 2008  . JOINT REPLACEMENT    .  PORTA CATH INSERTION N/A 01/29/2017   Procedure: Glori Luis Cath Insertion;  Surgeon: Algernon Huxley, MD;  Location: [place] CV LAB;  Service: Cardiovascular;  Laterality: N/A;  . TONSILLECTOMY      Allergies Patient has no known allergies.  Social History Social History   Tobacco Use  . Smoking status: Current Every Day Smoker    Packs/day: 2.00    Years: 40.00    Pack years: 80.00    Types: Cigarettes  . Smokeless tobacco: Never Used  Substance Use Topics  . Alcohol use: No  . Drug use: No   Review of Systems Constitutional: Negative for fever. Cardiovascular: Negative for chest pain. Respiratory: Negative for shortness of breath. Gastrointestinal: Negative for abdominal pain, vomiting and diarrhea. Musculoskeletal: Positive for left leg pain Skin: Negative for rash. Neurological: Negative for headaches, focal weakness or numbness.  All systems negative/normal/unremarkable except as stated in the HPI  ____________________________________________   PHYSICAL EXAM:  VITAL SIGNS: ED Triage Vitals  Enc Vitals Group     BP 07/16/18 0905 (!) 138/100     Pulse Rate 07/16/18 0905 95     Resp --      Temp 07/16/18 0912 98.2 F (36.8 C)     Temp Source 07/16/18 0912 Oral     SpO2 07/16/18 0905 97 %     Weight --      Height --      Head Circumference --      Peak Flow --  Pain Score --      Pain Loc --      Pain Edu? --      Excl. in [place]? --    Constitutional: Alert and oriented.  No acute distress Eyes: Conjunctivae are normal. Normal extraocular movements. ENT   Head: Normocephalic and atraumatic.   Nose: No congestion/rhinnorhea.   Mouth/Throat: Mucous membranes are moist.   Neck: No stridor. Cardiovascular: Normal rate, regular rhythm. No murmurs, rubs, or gallops. Respiratory: Normal respiratory effort without tachypnea nor retractions. Breath sounds are clear and equal bilaterally. No wheezes/rales/rhonchi. Gastrointestinal: Soft and  nontender. Normal bowel sounds Musculoskeletal: Pain with range of motion of the left lower extremity, normal examination otherwise Neurologic:  Normal speech and language. No gross focal neurologic deficits are appreciated.  Skin:  Skin is warm, dry and intact. No rash noted. Psychiatric: Depressed and tearful ____________________________________________  ED COURSE:  As part of my medical decision making, I reviewed the following data within the [place] History obtained from family if available, nursing notes, old chart and ekg, as well as notes from prior ED visits. Patient presented for radicular left leg pain, we will assess with labs and imaging as indicated at this time.   Procedures ____________________________________________   LABS (pertinent positives/negatives)  Labs Reviewed  CBC WITH DIFFERENTIAL/PLATELET - Abnormal; Notable for the following components:      Result Value   Abs Immature Granulocytes 0.28 (*)    All other components within normal limits  COMPREHENSIVE METABOLIC PANEL - Abnormal; Notable for the following components:   Calcium 8.3 (*)    Albumin 3.3 (*)    All other components within normal limits  URINALYSIS, COMPLETE (UACMP) WITH MICROSCOPIC - Abnormal; Notable for the following components:   Color, Urine YELLOW (*)    APPearance CLEAR (*)    All other components within normal limits  TROPONIN I    RADIOLOGY Images were viewed by me  Lumbar spine x-rays, left femur x-rays Are negative ____________________________________________  DIFFERENTIAL DIAGNOSIS   Sciatica, chronic pain, metastasis, occult infection  FINAL ASSESSMENT AND PLAN  Sciatica, chronic pain   Plan: The patient had presented for radicular left leg pain. Patient's labs did not reveal any acute process. Patient's imaging so negative.  I did prescribe Dilaudid here and I will give her prescription for Ativan and Toradol to take at home.  I am not sure why  her leg seems to be hurting so badly.  She is cleared for outpatient follow-up.   Laurence Aly, MD   Note: This note was generated in part or whole with voice recognition software. Voice recognition is usually quite accurate but there are transcription errors that can and very often do occur. I apologize for any typographical errors that were not detected and corrected.     Earleen Newport, MD 07/16/18 1213

## 2018-07-16 NOTE — ED Notes (Signed)
Pt ambulating in hallway with steady gait to door looking for her sister's car. Pt was assisted into vehicle.

## 2018-07-16 NOTE — ED Notes (Addendum)
Pt assisted to wheelchair. Pt sat in chair and became frantic stating her leg hurt. Pt placed back in bed and within 1 minute began to sleep/snore.

## 2019-08-12 ENCOUNTER — Other Ambulatory Visit: Payer: Self-pay

## 2019-08-12 ENCOUNTER — Inpatient Hospital Stay
Admission: EM | Admit: 2019-08-12 | Discharge: 2019-08-15 | DRG: 394 | Disposition: A | Discharge status: Home / Self Care | Payer: Medicare HMO | Attending: Internal Medicine | Admitting: Internal Medicine

## 2019-08-12 DIAGNOSIS — G8929 Other chronic pain: Secondary | ICD-10-CM | POA: Diagnosis present

## 2019-08-12 DIAGNOSIS — K56600 Partial intestinal obstruction, unspecified as to cause: Secondary | ICD-10-CM | POA: Diagnosis present

## 2019-08-12 DIAGNOSIS — Z20828 Contact with and (suspected) exposure to other viral communicable diseases: Secondary | ICD-10-CM | POA: Diagnosis present

## 2019-08-12 DIAGNOSIS — M8448XA Pathological fracture, other site, initial encounter for fracture: Secondary | ICD-10-CM | POA: Diagnosis present

## 2019-08-12 DIAGNOSIS — F39 Unspecified mood [affective] disorder: Secondary | ICD-10-CM | POA: Diagnosis present

## 2019-08-12 DIAGNOSIS — N179 Acute kidney failure, unspecified: Secondary | ICD-10-CM | POA: Diagnosis present

## 2019-08-12 DIAGNOSIS — Z933 Colostomy status: Secondary | ICD-10-CM

## 2019-08-12 DIAGNOSIS — K433 Parastomal hernia with obstruction, without gangrene: Principal | ICD-10-CM | POA: Diagnosis present

## 2019-08-12 DIAGNOSIS — K56609 Unspecified intestinal obstruction, unspecified as to partial versus complete obstruction: Secondary | ICD-10-CM | POA: Diagnosis not present

## 2019-08-12 DIAGNOSIS — E876 Hypokalemia: Secondary | ICD-10-CM | POA: Diagnosis present

## 2019-08-12 DIAGNOSIS — K219 Gastro-esophageal reflux disease without esophagitis: Secondary | ICD-10-CM | POA: Diagnosis present

## 2019-08-12 DIAGNOSIS — G2581 Restless legs syndrome: Secondary | ICD-10-CM | POA: Diagnosis present

## 2019-08-12 DIAGNOSIS — Z90710 Acquired absence of both cervix and uterus: Secondary | ICD-10-CM

## 2019-08-12 DIAGNOSIS — I10 Essential (primary) hypertension: Secondary | ICD-10-CM | POA: Diagnosis present

## 2019-08-12 DIAGNOSIS — I714 Abdominal aortic aneurysm, without rupture: Secondary | ICD-10-CM | POA: Diagnosis present

## 2019-08-12 DIAGNOSIS — C786 Secondary malignant neoplasm of retroperitoneum and peritoneum: Secondary | ICD-10-CM | POA: Diagnosis present

## 2019-08-12 DIAGNOSIS — G620 Drug-induced polyneuropathy: Secondary | ICD-10-CM | POA: Diagnosis present

## 2019-08-12 DIAGNOSIS — Z79899 Other long term (current) drug therapy: Secondary | ICD-10-CM

## 2019-08-12 DIAGNOSIS — Z9221 Personal history of antineoplastic chemotherapy: Secondary | ICD-10-CM

## 2019-08-12 DIAGNOSIS — C20 Malignant neoplasm of rectum: Secondary | ICD-10-CM | POA: Diagnosis present

## 2019-08-12 DIAGNOSIS — F17210 Nicotine dependence, cigarettes, uncomplicated: Secondary | ICD-10-CM | POA: Diagnosis present

## 2019-08-12 DIAGNOSIS — Z801 Family history of malignant neoplasm of trachea, bronchus and lung: Secondary | ICD-10-CM

## 2019-08-12 DIAGNOSIS — Z85038 Personal history of other malignant neoplasm of large intestine: Secondary | ICD-10-CM

## 2019-08-12 DIAGNOSIS — Z923 Personal history of irradiation: Secondary | ICD-10-CM

## 2019-08-12 DIAGNOSIS — Z808 Family history of malignant neoplasm of other organs or systems: Secondary | ICD-10-CM

## 2019-08-12 LAB — CBC WITH DIFFERENTIAL/PLATELET
Abs Immature Granulocytes: 0.08 10*3/uL — ABNORMAL HIGH (ref 0.00–0.07)
Basophils Absolute: 0 10*3/uL (ref 0.0–0.1)
Basophils Relative: 0 %
Eosinophils Absolute: 0 10*3/uL (ref 0.0–0.5)
Eosinophils Relative: 0 %
HCT: 46.9 % — ABNORMAL HIGH (ref 36.0–46.0)
Hemoglobin: 16.2 g/dL — ABNORMAL HIGH (ref 12.0–15.0)
Immature Granulocytes: 1 %
Lymphocytes Relative: 9 %
Lymphs Abs: 1.1 10*3/uL (ref 0.7–4.0)
MCH: 31.6 pg (ref 26.0–34.0)
MCHC: 34.5 g/dL (ref 30.0–36.0)
MCV: 91.6 fL (ref 80.0–100.0)
Monocytes Absolute: 0.5 10*3/uL (ref 0.1–1.0)
Monocytes Relative: 4 %
Neutro Abs: 10.6 10*3/uL — ABNORMAL HIGH (ref 1.7–7.7)
Neutrophils Relative %: 86 %
Platelets: 349 10*3/uL (ref 150–400)
RBC: 5.12 MIL/uL — ABNORMAL HIGH (ref 3.87–5.11)
RDW: 12 % (ref 11.5–15.5)
WBC: 12.4 10*3/uL — ABNORMAL HIGH (ref 4.0–10.5)
nRBC: 0 % (ref 0.0–0.2)

## 2019-08-12 LAB — COMPREHENSIVE METABOLIC PANEL
ALT: 19 U/L (ref 0–44)
AST: 28 U/L (ref 15–41)
Albumin: 4.2 g/dL (ref 3.5–5.0)
Alkaline Phosphatase: 107 U/L (ref 38–126)
Anion gap: 16 — ABNORMAL HIGH (ref 5–15)
BUN: 14 mg/dL (ref 8–23)
CO2: 30 mmol/L (ref 22–32)
Calcium: 10 mg/dL (ref 8.9–10.3)
Chloride: 95 mmol/L — ABNORMAL LOW (ref 98–111)
Creatinine, Ser: 1.06 mg/dL — ABNORMAL HIGH (ref 0.44–1.00)
GFR calc Af Amer: >60 mL/min (ref >60)
GFR calc non Af Amer: 56 mL/min — ABNORMAL LOW (ref >60)
Glucose, Bld: 159 mg/dL — ABNORMAL HIGH (ref 70–99)
Potassium: 3.2 mmol/L — ABNORMAL LOW (ref 3.5–5.1)
Sodium: 141 mmol/L (ref 135–145)
Total Bilirubin: 0.6 mg/dL (ref 0.3–1.2)
Total Protein: 7.5 g/dL (ref 6.5–8.1)

## 2019-08-12 LAB — LIPASE, BLOOD: Lipase: 28 U/L (ref 11–51)

## 2019-08-12 LAB — LACTIC ACID, PLASMA: Lactic Acid, Venous: 3.6 mmol/L (ref 0.5–1.9)

## 2019-08-12 MED ORDER — SODIUM CHLORIDE 0.9 % IV BOLUS
1000.0000 mL | Freq: Once | INTRAVENOUS | Status: AC
  Administered 2019-08-12: 1000 mL via INTRAVENOUS

## 2019-08-12 MED ORDER — IOHEXOL 9 MG/ML PO SOLN
500.0000 mL | Freq: Once | ORAL | Status: DC | PRN
  Administered 2019-08-12: 500 mL via ORAL
  Filled 2019-08-12 (×2): qty 500

## 2019-08-12 MED ORDER — PIPERACILLIN-TAZOBACTAM 3.375 G IVPB
3.3750 g | Freq: Once | INTRAVENOUS | Status: AC
  Administered 2019-08-13: 3.375 g via INTRAVENOUS
  Filled 2019-08-12: qty 50

## 2019-08-12 MED ORDER — SODIUM CHLORIDE 0.9 % IV BOLUS
1000.0000 mL | Freq: Once | INTRAVENOUS | Status: AC
  Administered 2019-08-13: 1000 mL via INTRAVENOUS

## 2019-08-12 MED ORDER — HYDROMORPHONE HCL 1 MG/ML IJ SOLN
1.0000 mg | Freq: Once | INTRAMUSCULAR | Status: AC
  Administered 2019-08-12: 1 mg via INTRAVENOUS
  Filled 2019-08-12: qty 1

## 2019-08-12 MED ORDER — ONDANSETRON HCL 4 MG/2ML IJ SOLN
4.0000 mg | Freq: Once | INTRAMUSCULAR | Status: AC
  Administered 2019-08-12: 4 mg via INTRAVENOUS
  Filled 2019-08-12: qty 2

## 2019-08-12 NOTE — ED Triage Notes (Signed)
Pt arrives to ED via [facility] from home with c/o abdominal pain since 7am this morning. Pt reports abdominal pain is like "labor contractions". Pt reports 10 episodes of emesis over the last 24 hrs. Pt arrives with 20g IV in the right AC placed by EMS; pt given 100 mcg Fentanyl and 4 mg Zofran en route. Pt has a h/x of colon cancer; last treatment completed 2 yrs ago.

## 2019-08-12 NOTE — ED Notes (Signed)
Dr Ellender Hose made aware at this time of pt's critically elevated Lactic Acid level as reported by lab. Lactic Acid 3.6 mmol/L.

## 2019-08-13 ENCOUNTER — Emergency Department: Payer: Medicare HMO

## 2019-08-13 DIAGNOSIS — Z20828 Contact with and (suspected) exposure to other viral communicable diseases: Secondary | ICD-10-CM | POA: Diagnosis present

## 2019-08-13 DIAGNOSIS — M8448XA Pathological fracture, other site, initial encounter for fracture: Secondary | ICD-10-CM | POA: Diagnosis present

## 2019-08-13 DIAGNOSIS — Z79899 Other long term (current) drug therapy: Secondary | ICD-10-CM | POA: Diagnosis not present

## 2019-08-13 DIAGNOSIS — F17210 Nicotine dependence, cigarettes, uncomplicated: Secondary | ICD-10-CM | POA: Diagnosis present

## 2019-08-13 DIAGNOSIS — Z9221 Personal history of antineoplastic chemotherapy: Secondary | ICD-10-CM | POA: Diagnosis not present

## 2019-08-13 DIAGNOSIS — K56609 Unspecified intestinal obstruction, unspecified as to partial versus complete obstruction: Secondary | ICD-10-CM | POA: Diagnosis present

## 2019-08-13 DIAGNOSIS — K56600 Partial intestinal obstruction, unspecified as to cause: Secondary | ICD-10-CM | POA: Diagnosis present

## 2019-08-13 DIAGNOSIS — I10 Essential (primary) hypertension: Secondary | ICD-10-CM | POA: Diagnosis present

## 2019-08-13 DIAGNOSIS — I714 Abdominal aortic aneurysm, without rupture: Secondary | ICD-10-CM | POA: Diagnosis present

## 2019-08-13 DIAGNOSIS — Z808 Family history of malignant neoplasm of other organs or systems: Secondary | ICD-10-CM | POA: Diagnosis not present

## 2019-08-13 DIAGNOSIS — G620 Drug-induced polyneuropathy: Secondary | ICD-10-CM | POA: Diagnosis present

## 2019-08-13 DIAGNOSIS — Z933 Colostomy status: Secondary | ICD-10-CM | POA: Diagnosis not present

## 2019-08-13 DIAGNOSIS — K433 Parastomal hernia with obstruction, without gangrene: Secondary | ICD-10-CM | POA: Diagnosis present

## 2019-08-13 DIAGNOSIS — Z923 Personal history of irradiation: Secondary | ICD-10-CM | POA: Diagnosis not present

## 2019-08-13 DIAGNOSIS — Z85038 Personal history of other malignant neoplasm of large intestine: Secondary | ICD-10-CM | POA: Diagnosis not present

## 2019-08-13 DIAGNOSIS — G8929 Other chronic pain: Secondary | ICD-10-CM | POA: Diagnosis present

## 2019-08-13 DIAGNOSIS — Z90710 Acquired absence of both cervix and uterus: Secondary | ICD-10-CM | POA: Diagnosis not present

## 2019-08-13 DIAGNOSIS — F39 Unspecified mood [affective] disorder: Secondary | ICD-10-CM | POA: Diagnosis present

## 2019-08-13 DIAGNOSIS — C786 Secondary malignant neoplasm of retroperitoneum and peritoneum: Secondary | ICD-10-CM | POA: Diagnosis present

## 2019-08-13 DIAGNOSIS — K219 Gastro-esophageal reflux disease without esophagitis: Secondary | ICD-10-CM | POA: Diagnosis present

## 2019-08-13 DIAGNOSIS — N179 Acute kidney failure, unspecified: Secondary | ICD-10-CM | POA: Diagnosis present

## 2019-08-13 DIAGNOSIS — C20 Malignant neoplasm of rectum: Secondary | ICD-10-CM | POA: Diagnosis present

## 2019-08-13 DIAGNOSIS — E876 Hypokalemia: Secondary | ICD-10-CM | POA: Diagnosis present

## 2019-08-13 DIAGNOSIS — Z801 Family history of malignant neoplasm of trachea, bronchus and lung: Secondary | ICD-10-CM | POA: Diagnosis not present

## 2019-08-13 DIAGNOSIS — G2581 Restless legs syndrome: Secondary | ICD-10-CM | POA: Diagnosis present

## 2019-08-13 LAB — URINALYSIS, COMPLETE (UACMP) WITH MICROSCOPIC
Bacteria, UA: NONE SEEN
Bilirubin Urine: NEGATIVE
Glucose, UA: NEGATIVE mg/dL
Ketones, ur: NEGATIVE mg/dL
Leukocytes,Ua: NEGATIVE
Nitrite: NEGATIVE
Protein, ur: NEGATIVE mg/dL
Specific Gravity, Urine: >1.046 — ABNORMAL HIGH (ref 1.005–1.030)
pH: 6 (ref 5.0–8.0)

## 2019-08-13 LAB — SARS CORONAVIRUS 2 (TAT 6-24 HRS): SARS Coronavirus 2: NEGATIVE

## 2019-08-13 LAB — LACTIC ACID, PLASMA: Lactic Acid, Venous: 2.5 mmol/L (ref 0.5–1.9)

## 2019-08-13 MED ORDER — CHLORHEXIDINE GLUCONATE CLOTH 2 % EX PADS
6.0000 | MEDICATED_PAD | Freq: Every day | CUTANEOUS | Status: DC
  Administered 2019-08-14 – 2019-08-15 (×2): 6 via TOPICAL

## 2019-08-13 MED ORDER — HYDROMORPHONE HCL 1 MG/ML IJ SOLN
1.0000 mg | Freq: Once | INTRAMUSCULAR | Status: AC
  Administered 2019-08-13: 01:00:00 1 mg via INTRAVENOUS
  Filled 2019-08-13: qty 1

## 2019-08-13 MED ORDER — GABAPENTIN 300 MG PO CAPS
300.0000 mg | ORAL_CAPSULE | Freq: Three times a day (TID) | ORAL | Status: DC
  Administered 2019-08-13 – 2019-08-14 (×5): 300 mg via ORAL
  Filled 2019-08-13 (×6): qty 1

## 2019-08-13 MED ORDER — SODIUM CHLORIDE 0.9% FLUSH
10.0000 mL | Freq: Two times a day (BID) | INTRAVENOUS | Status: DC
  Administered 2019-08-13 – 2019-08-15 (×4): 10 mL

## 2019-08-13 MED ORDER — NORTRIPTYLINE HCL 10 MG PO CAPS
10.0000 mg | ORAL_CAPSULE | Freq: Every day | ORAL | Status: DC
  Administered 2019-08-13 – 2019-08-14 (×2): 10 mg via ORAL
  Filled 2019-08-13 (×3): qty 1

## 2019-08-13 MED ORDER — BACLOFEN 10 MG PO TABS
10.0000 mg | ORAL_TABLET | Freq: Three times a day (TID) | ORAL | Status: DC
  Administered 2019-08-13 – 2019-08-15 (×8): 10 mg via ORAL
  Filled 2019-08-13 (×10): qty 1

## 2019-08-13 MED ORDER — KCL IN DEXTROSE-NACL 20-5-0.45 MEQ/L-%-% IV SOLN
INTRAVENOUS | Status: DC
  Administered 2019-08-13 – 2019-08-14 (×4): via INTRAVENOUS
  Filled 2019-08-13 (×3): qty 1000

## 2019-08-13 MED ORDER — ENOXAPARIN SODIUM 40 MG/0.4ML ~~LOC~~ SOLN: 40.0000 mg | SUBCUTANEOUS | Status: DC

## 2019-08-13 MED ORDER — ACETAMINOPHEN 325 MG PO TABS: 650.0000 mg | ORAL_TABLET | Freq: Four times a day (QID) | ORAL | Status: DC | PRN

## 2019-08-13 MED ORDER — MORPHINE SULFATE (PF) 2 MG/ML IV SOLN
2.0000 mg | INTRAVENOUS | Status: DC | PRN
  Administered 2019-08-13 – 2019-08-14 (×7): 2 mg via INTRAVENOUS
  Filled 2019-08-13 (×8): qty 1

## 2019-08-13 MED ORDER — ACETAMINOPHEN 650 MG RE SUPP
650.0000 mg | Freq: Four times a day (QID) | RECTAL | Status: DC | PRN
  Filled 2019-08-13: qty 1

## 2019-08-13 MED ORDER — MORPHINE SULFATE (PF) 2 MG/ML IV SOLN
2.0000 mg | INTRAVENOUS | Status: DC | PRN
  Administered 2019-08-13: 2 mg via INTRAVENOUS
  Filled 2019-08-13: qty 1

## 2019-08-13 MED ORDER — BISACODYL 10 MG RE SUPP
10.0000 mg | Freq: Every day | RECTAL | Status: DC | PRN
  Filled 2019-08-13: qty 1

## 2019-08-13 MED ORDER — IOHEXOL 300 MG/ML  SOLN
100.0000 mL | Freq: Once | INTRAMUSCULAR | Status: AC | PRN
  Administered 2019-08-13: 100 mL via INTRAVENOUS

## 2019-08-13 MED ORDER — ONDANSETRON HCL 4 MG PO TABS: 4.0000 mg | ORAL_TABLET | Freq: Four times a day (QID) | ORAL | Status: DC | PRN

## 2019-08-13 MED ORDER — ENOXAPARIN SODIUM 40 MG/0.4ML ~~LOC~~ SOLN
40.0000 mg | SUBCUTANEOUS | Status: DC
  Administered 2019-08-13 – 2019-08-14 (×2): 40 mg via SUBCUTANEOUS
  Filled 2019-08-13 (×2): qty 0.4

## 2019-08-13 MED ORDER — ONDANSETRON HCL 4 MG/2ML IJ SOLN: 4.0000 mg | Freq: Four times a day (QID) | INTRAMUSCULAR | Status: DC | PRN

## 2019-08-13 NOTE — Consult Note (Addendum)
PHARMACY -  BRIEF ANTIBIOTIC NOTE   Pharmacy has received consult(s) for Zosyn (intra-abdominal infection) from an ED provider.  The patient's profile has been reviewed for ht/wt/allergies/indication/available labs.    One time order(s) placed for Zosyn 3.375g x1 dose given @ 0057.   Further antibiotics/pharmacy consults should be ordered by admitting physician if indicated.                       Thank you, Rowland Lathe 08/13/2019  7:52 AM

## 2019-08-13 NOTE — ED Notes (Signed)
Pt ambulated to toilet with some difficulty.

## 2019-08-13 NOTE — ED Notes (Signed)
Family at bedside. 

## 2019-08-13 NOTE — ED Notes (Signed)
Notified by pt that IV was hurting. Noted swelling to R upper distal to IV site. IVF paused. Will notify MD. Pharmacy states just to place ice pack over site. IV left in place until MD is reached. Pt states this is the 4th PIV that has infiltrated since arrival.

## 2019-08-13 NOTE — ED Notes (Signed)
IV team at the bedside. 

## 2019-08-13 NOTE — Consult Note (Signed)
SURGICAL CONSULTATION NOTE   HISTORY OF PRESENT ILLNESS (HPI):  63 y.o. female presented to [facility] ED for evaluation of abdominal pain since this morning. Patient reports having diffuse abdominal pain since this morning.  She reports that the pain is described as cramping.  He has been intermittent up to 10 out of 10.  The pain does not radiate to other part of the body.  She reports associated nausea.  Nausea improved with current nausea medications.  She reported that the colostomy has been working properly.  She reported she has continued passing gas and stool.  There has been no aggravating factor identified.  Alleviating factor has been pain medications in the ED.  And immobile coagulation the pain has improved.  Upon arrival to the ED the patient has been evaluated with CT scan.  The CT did not show multiple small bowel loops noted.  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  No sign of strangulation.  I personally evaluated the images.  My leukocytosis was found.  Minimally decreased potassium level mild increasing creatinine.  Patient has history of stage IV rectal cancer.  She had a colostomy performed few [facility] ago at [facility].  Surgery is consulted by Dr. Posey Pronto in this context for evaluation and management of small bowel obstruction.  PAST MEDICAL HISTORY (PMH):  Past Medical History:  Diagnosis Date  . Cancer ([place])    colon  . Hypertension   . Pneumonia    20+ years ago     PAST SURGICAL HISTORY ([place]):  Past Surgical History:  Procedure Laterality Date  . ABDOMINAL HYSTERECTOMY    . BREAST SURGERY    . COLONOSCOPY WITH PROPOFOL N/A 01/14/2017   Procedure: COLONOSCOPY WITH PROPOFOL;  Surgeon: Lucilla Lame, MD;  Location: [facility] ENDOSCOPY;  Service: Endoscopy;  Laterality: N/A;  . ELBOW SURGERY Right    Has had 4 surgeries on right elbow.  Marland Kitchen HAND SURGERY Right 2008  . JOINT REPLACEMENT    . PORTA CATH INSERTION N/A 01/29/2017   Procedure: Glori Luis Cath Insertion;   Surgeon: Algernon Huxley, MD;  Location: [place] CV LAB;  Service: Cardiovascular;  Laterality: N/A;  . TONSILLECTOMY       MEDICATIONS:  Prior to Admission medications   Medication Sig Start Date End Date Taking? Authorizing Provider  amLODipine (NORVASC) 5 MG tablet Take 5 mg by mouth daily. 08/02/19  Yes [provider]  baclofen (LIORESAL) 10 MG tablet Take 10 mg by mouth 3 (three) times daily. 07/21/19 08/20/19 Yes [provider]  gabapentin (NEURONTIN) 300 MG capsule TAKE 1 CAPSULE BY MOUTH THREE TIMES A DAY 03/16/18  Yes Karen Kitchens, NP  hydrochlorothiazide (HYDRODIURIL) 25 MG tablet Take 25 mg by mouth daily. 06/16/19 06/15/20 Yes [provider]  HYDROcodone-acetaminophen (NORCO/VICODIN) 5-325 MG tablet Take 1 tablet by mouth every 6 (six) hours as needed for moderate pain. 05/10/18  Yes Johnn Hai, PA-C  nortriptyline (PAMELOR) 10 MG capsule Take 10 mg by mouth at bedtime. 07/16/19  Yes [provider]     ALLERGIES:  No Known Allergies   SOCIAL HISTORY:  Social History   Socioeconomic History  . Marital status: Divorced    Spouse name: Not on file  . Number of children: Not on file  . Years of education: Not on file  . Highest education level: Not on file  Occupational History  . Not on file  Social Needs  . Financial resource strain: Not on  file  . Food insecurity    Worry: Not on file    Inability: Not on file  . Transportation needs    Medical: Not on file    Non-medical: Not on file  Tobacco Use  . Smoking status: Current Every Day Smoker    Packs/day: 2.00    Years: 40.00    Pack years: 80.00    Types: Cigarettes  . Smokeless tobacco: Never Used  Substance and Sexual Activity  . Alcohol use: No  . Drug use: No  . Sexual activity: Not on file  Lifestyle  . Physical activity    Days per week: Not on file    Minutes per session: Not on file  . Stress: Not on file  Relationships  . Social Herbalist  on phone: Not on file    Gets together: Not on file    Attends religious service: Not on file    Active member of club or organization: Not on file    Attends meetings of clubs or organizations: Not on file    Relationship status: Not on file  . Intimate partner violence    Fear of current or ex partner: Not on file    Emotionally abused: Not on file    Physically abused: Not on file    Forced sexual activity: Not on file  Other Topics Concern  . Not on file  Social History Narrative  . Not on file    The patient currently resides (home / rehab facility / nursing home): Home The patient normally is (ambulatory / bedbound): Ambulatory   FAMILY HISTORY:  Family History  Problem Relation Age of Onset  . Lung cancer Mother   . Lung cancer Father   . Brain cancer Maternal Grandmother      REVIEW OF SYSTEMS:  Constitutional: denies weight loss, fever, chills, or sweats  Eyes: denies any other vision changes, history of eye injury  ENT: denies sore throat, hearing problems  Respiratory: denies shortness of breath, wheezing  Cardiovascular: denies chest pain, palpitations  Gastrointestinal: Positive abdominal pain, N/V Genitourinary: denies burning with urination or urinary frequency Musculoskeletal: denies any other joint pains or cramps  Skin: denies any other rashes or skin discolorations  Neurological: denies any other headache, dizziness, weakness  Psychiatric: denies any other depression, anxiety   All other review of systems were negative   VITAL SIGNS:  Temp:  [98.4 F (36.9 C)] 98.4 F (36.9 C) (11/05 2155) Pulse Rate:  [77-95] 77 (11/06 1153) Resp:  [16-18] 16 (11/06 1153) BP: (121-154)/(59-108) 136/65 (11/06 1153) SpO2:  [94 %-100 %] 97 % (11/06 1153) Weight:  [86.2 kg] 86.2 kg (11/05 2150)     Height: 5\' 7"  (170.2 cm) Weight: 86.2 kg BMI (Calculated): 29.75   INTAKE/OUTPUT:  This shift: No intake/output data recorded.  Last 2 shifts: @IOLAST2SHIFTS @    PHYSICAL EXAM:  Constitutional:  -- Normal body habitus  -- Awake, alert, and oriented x3  Eyes:  -- Pupils equally round and reactive to light  -- No scleral icterus  Ear, nose, and throat:  -- No jugular venous distension  Pulmonary:  -- No crackles  -- Equal breath sounds bilaterally -- Breathing non-labored at rest Cardiovascular:  -- S1, S2 present  -- No pericardial rubs Gastrointestinal:  -- Abdomen soft, nontender, non-distended, no guarding or rebound tenderness.  Colostomy pink and patent with stool in the bag. -- No abdominal masses appreciated, pulsatile or otherwise  Musculoskeletal and Integumentary:  --  Wounds or skin discoloration: None appreciated -- Extremities: B/L UE and LE FROM, hands and feet warm, no edema  Neurologic:  -- Motor function: intact and symmetric -- Sensation: intact and symmetric   Labs:  CBC Latest Ref Rng & Units 08/12/2019 07/16/2018 02/02/2018  WBC 4.0 - 10.5 K/uL 12.4(H) 9.5 4.6  Hemoglobin 12.0 - 15.0 g/dL 16.2(H) 12.8 10.3(L)  Hematocrit 36.0 - 46.0 % 46.9(H) 38.8 31.6(L)  Platelets 150 - 400 K/uL 349 255 319   CMP Latest Ref Rng & Units 08/12/2019 07/16/2018 02/02/2018  Glucose 70 - 99 mg/dL 159(H) 94 119(H)  BUN 8 - 23 mg/dL 14 16 9   Creatinine 0.44 - 1.00 mg/dL 1.06(H) 0.67 0.70  Sodium 135 - 145 mmol/L 141 138 138  Potassium 3.5 - 5.1 mmol/L 3.2(L) 3.8 3.8  Chloride 98 - 111 mmol/L 95(L) 100 104  CO2 22 - 32 mmol/L 30 27 24   Calcium 8.9 - 10.3 mg/dL 10.0 8.3(L) 8.9  Total Protein 6.5 - 8.1 g/dL 7.5 6.5 6.8  Total Bilirubin 0.3 - 1.2 mg/dL 0.6 0.6 0.3  Alkaline Phos 38 - 126 U/L 107 105 92  AST 15 - 41 U/L 28 19 18   ALT 0 - 44 U/L 19 15 10(L)   Imaging studies:   EXAM: CT ABDOMEN AND PELVIS WITH CONTRAST  TECHNIQUE: Multidetector CT imaging of the abdomen and pelvis was performed using the standard protocol following bolus administration of intravenous contrast.  CONTRAST:  133mL OMNIPAQUE IOHEXOL 300 MG/ML   SOLN  COMPARISON:  Feb 17, 2018  FINDINGS: Lower chest: The lung bases are clear. The heart size is normal.  Hepatobiliary: Again noted is an ablation defect in hepatic segment 4A. The ablation defect has decreased over time. There are numerable small calcifications throughout the liver. There is hepatic steatosis. Normal gallbladder.There is no biliary ductal dilation.  Pancreas: Normal contours without ductal dilatation. No peripancreatic fluid collection.  Spleen: There are numerable calcifications in the spleen.  Adrenals/Urinary Tract:  --Adrenal glands: No adrenal hemorrhage.  --Right kidney/ureter: No hydronephrosis or perinephric hematoma.  --Left kidney/ureter: No hydronephrosis or perinephric hematoma.  --Urinary bladder: There is mild diffuse bladder wall thickening.  Stomach/Bowel:  --Stomach/Duodenum: No hiatal hernia or other gastric abnormality. Normal duodenal course and caliber.  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 However, there are dilated loops of small bowel beyond this location which may be secondary to an ileus.  --Colon: There is a left lower quadrant colostomy without evidence for an obstruction. There is scattered colonic diverticula involving the remaining portions of the colon.  --Appendix: Not visualized. No right lower quadrant inflammation or free fluid.  Vascular/Lymphatic: There are atherosclerotic changes of the abdominal aorta with a 3.5 cm infrarenal abdominal aortic aneurysm.  --No retroperitoneal lymphadenopathy.  --No mesenteric lymphadenopathy.  --No pelvic or inguinal lymphadenopathy.  Reproductive: Unremarkable  Other: There are extensive postsurgical changes in the pelvis with persistent presacral soft tissue. There is no abscess.  Musculoskeletal. There are bilateral sacral insufficiency  fractures. There are extensive sclerotic areas throughout the sacrum which may represent post treatment changes.  IMPRESSION: 1. 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 However, there are dilated loops of small bowel beyond this location which may be secondary to an ileus or partial small bowel obstruction. 2. Left lower quadrant colostomy. 3. Hepatic  steatosis. 4. Post treatment changes in the pelvis with persistent presacral soft tissue. 5. Bilateral sacral insufficiency fractures. 6. Aortic atherosclerosis with a 3.5 cm infrarenal abdominal aortic aneurysm. Recommend followup by ultrasound in 2 years. This recommendation follows ACR consensus guidelines: White Paper of the ACR Incidental Findings Committee II on Vascular Findings. J Am Coll Radiol 2013; 10:789-794. Aortic aneurysm NOS (ICD10-I71.9)  Aortic Atherosclerosis (ICD10-I70.0).   Electronically Signed   By: Constance Holster M.D.   On: 08/13/2019 00:43   Assessment/Plan:  63 y.o. female with small bowel obstruction, complicated by pertinent comorbidities including stage IV rectal cancer, hypertension, hypertension. Patient with partial small bowel obstruction.  At the moment of my evaluation the patient was passing stool and gas per ostomy.  Due to the intra-abdominal pain and nausea I do recommend to give her some bowel rest and nausea medication.  As soon as the pain improve and if the patient does not have nausea clear liquid diet trial may be started maybe tomorrow morning.  I agree with IV fluid replacement and pain management as needed.  I encouraged the patient to ambulate.  I also oriented the patient that if she developed nausea and vomiting and the ostomy stopped working she might need an nasogastric tube for decompression.  The fact that the patient has elevated CEA increasing trend is not a good sign and a more  aggressive metastasis can be getting worse such as peritoneal implants.  This was not identified on the CT scan was obtained is difficult to identify.  Patient has a parastomal hernia but I do not think there is a cause of the obstruction at this moment.  After patient is discharged she should return to her surgeon for further discussion about hernia repair.  We will continue to follow along and assist in the treatment of this patient.  Arnold Long, MD

## 2019-08-13 NOTE — ED Provider Notes (Signed)
[facility] Emergency Department Provider Note  ____________________________________________   First MD Initiated Contact with Patient 08/12/19 2153     (approximate)  I have reviewed the triage vital signs and the nursing notes.   HISTORY  Chief Complaint Abdominal Pain and Hypertension    HPI Tamara Hall is a 63 y.o. female with history of colon cancer status post multiple resections here with abdominal pain, nausea, vomiting.    At approximately 7 AM this morning, the patient states that she developed acute, fairly severe onset lower abdominal pain that felt like a contraction.  She states that her entire abdomen became incredibly painful and she began to feel nauseous.  This pain has come and gone and is increasingly frequent and severe throughout the day.  It is 10 out of 10 at its worst.  She states she is also developed some mild nausea and vomiting of foul-smelling vomit, nonbloody, since the onset of the pain.  She denies any fevers or chills.  No history of previous bowel obstructions though she has had multiple intra-abdominal surgeries.  Of note, she did have episode somewhat similar to this several weeks ago, but these resolved spontaneously.       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[place]) 05/27/2017   Nausea without vomiting 05/06/2017   Liver metastases ([place]) 02/25/2017   Goals of care, counseling/discussion 02/18/2017   Encounter for antineoplastic chemotherapy 02/18/2017   Cancer related pain 02/16/2017   Tobacco use 02/06/2017   Abnormal findings-gastrointestinal tract    Rectum neoplasm    Rectal bleeding 01/13/2017   Rectal cancer [facility]      Past Surgical History:  Procedure Laterality Date   ABDOMINAL HYSTERECTOMY     BREAST SURGERY     COLONOSCOPY WITH PROPOFOL N/A 01/14/2017   Procedure: COLONOSCOPY WITH PROPOFOL;  Surgeon: Lucilla Lame, MD;  Location: [facility] ENDOSCOPY;  Service: Endoscopy;  Laterality: N/A;   ELBOW SURGERY Right    Has had 4 surgeries on right elbow.   HAND SURGERY Right 2008   JOINT REPLACEMENT     PORTA CATH INSERTION N/A 01/29/2017   Procedure: Glori Luis Cath Insertion;  Surgeon: Algernon Huxley, MD;  Location: [place] [facility];  Service: Cardiovascular;  Laterality: N/A;   TONSILLECTOMY      Prior to Admission medications   Medication Sig Start Date End Date Taking? Authorizing Provider  amLODipine (NORVASC) 5 MG tablet Take 5 mg by mouth daily. 08/02/19  Yes [provider]  baclofen (LIORESAL) 10 MG tablet Take 10 mg by mouth 3 (three) times daily. 07/21/19 08/20/19 Yes [provider]  gabapentin (NEURONTIN) 300 MG capsule TAKE 1 CAPSULE BY MOUTH THREE TIMES A DAY 03/16/18  Yes Karen Kitchens, NP  hydrochlorothiazide (HYDRODIURIL) 25 MG tablet Take 25 mg by mouth daily. 06/16/19 06/15/20 Yes [provider]  HYDROcodone-acetaminophen (NORCO/VICODIN) 5-325 MG tablet Take 1 tablet by mouth every 6 (six) hours as needed for moderate pain. 05/10/18  Yes Johnn Hai, PA-C  nortriptyline (PAMELOR) 10 MG capsule Take 10 mg by mouth at bedtime. 07/16/19  Yes [provider]    Allergies Patient has no known  allergies.  Family History  Problem Relation Age of Onset   Lung cancer Mother    Lung cancer Father    Brain cancer Maternal Grandmother     Social History Social History   Tobacco Use   Smoking status: Current Every Day Smoker    Packs/day: 2.00    Years: 40.00    Pack years: 80.00    Types: Cigarettes   Smokeless tobacco: Never Used  Substance Use Topics   Alcohol use: No   Drug use: No    Review of Systems  Review of Systems   Constitutional: Positive for fatigue. Negative for fever.  HENT: Negative for congestion and sore throat.   Eyes: Negative for visual disturbance.  Respiratory: Negative for cough and shortness of breath.   Cardiovascular: Negative for chest pain.  Gastrointestinal: Positive for abdominal distention, abdominal pain, nausea and vomiting. Negative for diarrhea.  Genitourinary: Negative for flank pain.  Musculoskeletal: Negative for back pain and neck pain.  Skin: Negative for rash and wound.  Neurological: Negative for weakness.  All other systems reviewed and are negative.    ____________________________________________  PHYSICAL EXAM:      VITAL SIGNS: ED Triage Vitals  Enc Vitals Group     BP 08/12/19 2155 (!) 123/108     Pulse Rate 08/12/19 2155 95     Resp 08/12/19 2155 18     Temp 08/12/19 2155 98.4 F (36.9 C)     Temp Source 08/12/19 2155 Oral     SpO2 08/12/19 2155 94 %     Weight 08/12/19 2150 190 lb (86.2 kg)     Height 08/12/19 2150 5\' 7"  (1.702 m)     Head Circumference --      Peak Flow --      Pain Score 08/12/19 2149 5     Pain Loc --      Pain Edu? --      Excl. in [place]? --      Physical Exam Vitals signs and nursing note reviewed.  Constitutional:      General: She is not in acute distress.    Appearance: She is well-developed.  HENT:     Head: Normocephalic and atraumatic.  Eyes:     Conjunctiva/sclera: Conjunctivae normal.  Neck:     Musculoskeletal: Neck supple.  Cardiovascular:     Rate and Rhythm: Normal rate and regular rhythm.     Heart sounds: Normal heart sounds. No murmur. No friction rub.  Pulmonary:     Effort: Pulmonary effort is normal. No respiratory distress.     Breath sounds: Normal breath sounds. No wheezing or rales.  Abdominal:     General: Bowel sounds are increased. There is distension.     Palpations: Abdomen is soft.     Tenderness: There is generalized abdominal tenderness and tenderness in the right lower quadrant,  periumbilical area, suprapubic area and left lower quadrant. There is guarding. There is no rebound.     Comments: Ostomy site pink, patent, minimal amount of stool in ostomy bag  Skin:    General: Skin is warm.     Capillary Refill: Capillary refill takes less than 2 seconds.  Neurological:     Mental Status: She is alert and oriented to person, place, and time.     Motor: No abnormal muscle tone.       ____________________________________________   LABS (all labs ordered are listed, but only abnormal results are displayed)  Labs Reviewed  CBC WITH DIFFERENTIAL/PLATELET -  Abnormal; Notable for the following components:      Result Value   WBC 12.4 (*)    RBC 5.12 (*)    Hemoglobin 16.2 (*)    HCT 46.9 (*)    Neutro Abs 10.6 (*)    Abs Immature Granulocytes 0.08 (*)    All other components within normal limits  COMPREHENSIVE METABOLIC PANEL - Abnormal; Notable for the following components:   Potassium 3.2 (*)    Chloride 95 (*)    Glucose, Bld 159 (*)    Creatinine, Ser 1.06 (*)    GFR calc non Af Amer 56 (*)    Anion gap 16 (*)    All other components within normal limits  LACTIC ACID, PLASMA - Abnormal; Notable for the following components:   Lactic Acid, Venous 3.6 (*)    All other components within normal limits  LACTIC ACID, PLASMA - Abnormal; Notable for the following components:   Lactic Acid, Venous 2.5 (*)    All other components within normal limits  CULTURE, BLOOD (ROUTINE X 2)  CULTURE, BLOOD (ROUTINE X 2)  SARS CORONAVIRUS 2 (TAT 6-24 HRS)  LIPASE, BLOOD  URINALYSIS, COMPLETE (UACMP) WITH MICROSCOPIC    ____________________________________________  EKG: None ________________________________________  RADIOLOGY All imaging, including plain films, CT scans, and ultrasounds, independently reviewed by me, and interpretations confirmed via formal radiology reads.  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 The patient underwent prior microwave ablation. EXAM: CT ABDOMEN AND PELVIS WITH CONTRAST TECHNIQUE: Multidetector CT imaging of the abdomen and pelvis was performed using the standard protocol following bolus administration of intravenous contrast. CONTRAST:  141mL OMNIPAQUE IOHEXOL 300 MG/ML  SOLN COMPARISON:  Feb 17, 2018 FINDINGS: Lower chest: The lung bases are clear. The heart size is normal. Hepatobiliary: Again noted is an ablation defect in hepatic segment 4A. The ablation defect has decreased over time. There are numerable small calcifications throughout the liver. There is hepatic steatosis. Normal gallbladder.There is no biliary ductal dilation. Pancreas: Normal contours without ductal dilatation. No peripancreatic fluid collection. Spleen: There are numerable calcifications in the spleen. Adrenals/Urinary Tract: --Adrenal glands: No adrenal hemorrhage. --Right kidney/ureter: No hydronephrosis or perinephric hematoma. --Left kidney/ureter: No hydronephrosis or perinephric hematoma. --Urinary bladder: There is mild diffuse bladder wall thickening. Stomach/Bowel: --Stomach/Duodenum: No hiatal hernia or other gastric abnormality. Normal duodenal course and caliber. 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 However, there are dilated loops of small bowel beyond this location which may be secondary to an ileus. --Colon: There is a left lower quadrant colostomy without evidence for an obstruction. There is scattered colonic diverticula involving the remaining portions of the colon. --Appendix: Not visualized. No right lower quadrant inflammation or free fluid. Vascular/Lymphatic: There are atherosclerotic  changes of the abdominal aorta with a 3.5 cm infrarenal abdominal aortic aneurysm. --No retroperitoneal lymphadenopathy. --No mesenteric lymphadenopathy. --No pelvic or inguinal lymphadenopathy. Reproductive: Unremarkable Other: There are extensive postsurgical changes in the pelvis with persistent presacral soft tissue. There is no abscess. Musculoskeletal. There are bilateral sacral insufficiency fractures. There are extensive sclerotic areas throughout the sacrum which may represent post treatment changes. IMPRESSION: 1. Moderate to low-grade small-bowel obstruction in part due loops  of small bowel within a peristomal hernia in the left lower quadrant. However, there are dilated loops of small bowel beyond this location which may be secondary to an ileus or partial small bowel obstruction. 2. Left lower quadrant colostomy. 3. Hepatic steatosis. 4. Post treatment changes in the pelvis with persistent presacral soft tissue. 5. Bilateral sacral insufficiency fractures. 6. Aortic atherosclerosis with a 3.5 cm infrarenal abdominal aortic aneurysm. Recommend followup by ultrasound in 2 years. This recommendation follows ACR consensus guidelines: White Paper of the ACR Incidental Findings Committee II on Vascular Findings. J Am Coll Radiol 2013; 10:789-794. Aortic aneurysm NOS (ICD10-I71.9) Aortic Atherosclerosis (ICD10-I70.0). Electronically Signed   By: Constance Holster M.D.   On: 08/13/2019 00:43    ____________________________________________  PROCEDURES   Procedure(s) performed (including Critical Care):  Procedures  ____________________________________________  INITIAL IMPRESSION / MDM / [place] / ED COURSE  As part of my medical decision making, I reviewed the following data within the electronic MEDICAL RECORD NUMBER Notes from prior ED visits and [place] Controlled Substance Database      *Tamara Hall was evaluated in Emergency Department on 08/13/2019 for the symptoms described in the  history of present illness. She was evaluated in the context of the global COVID-19 pandemic, which necessitated consideration that the patient might be at risk for infection with the SARS-CoV-2 virus that causes COVID-19. Institutional protocols and algorithms that pertain to the evaluation of patients at risk for COVID-19 are in a state of rapid change based on information released by regulatory bodies including the CDC and federal and state organizations. These policies and algorithms were followed during the patient's care in the ED.  Some ED evaluations and interventions may be delayed as a result of limited staffing during the pandemic.*      Medical Decision Making: 64 year old female here with severe abdominal pain.  Suspect possible bowel obstruction, will check CT scan.  Lab work shows leukocytosis with mild lactic acidosis, so will give empiric Zosyn though she is afebrile without tachycardia or other signs of sepsis.  Plan to follow-up CT and admit.  ____________________________________________  FINAL CLINICAL IMPRESSION(S) / ED DIAGNOSES  Final diagnoses:  Small bowel obstruction ([place])     MEDICATIONS GIVEN DURING THIS VISIT:  Medications  iohexol (OMNIPAQUE) 9 MG/ML oral solution 500 mL (500 mLs Oral Contrast Given 08/12/19 2223)  piperacillin-tazobactam (ZOSYN) IVPB 3.375 g ( Intravenous Restarted 08/13/19 0128)  HYDROmorphone (DILAUDID) injection 1 mg (1 mg Intravenous Given 08/12/19 2217)  ondansetron (ZOFRAN) injection 4 mg (4 mg Intravenous Given 08/12/19 2217)  sodium chloride 0.9 % bolus 1,000 mL (0 mLs Intravenous Stopped 08/13/19 0001)  sodium chloride 0.9 % bolus 1,000 mL ( Intravenous Restarted 08/13/19 0128)  iohexol (OMNIPAQUE) 300 MG/ML solution 100 mL (100 mLs Intravenous Contrast Given 08/13/19 0012)  HYDROmorphone (DILAUDID) injection 1 mg (1 mg Intravenous Given 08/13/19 0118)     ED Discharge Orders    None       Note:  This document was prepared using  Dragon voice recognition software and may include unintentional dictation errors.   Duffy Bruce, MD 08/13/19 [phone]

## 2019-08-13 NOTE — ED Provider Notes (Signed)
1:41 AM Assumed care for off going team.   Blood pressure 136/69, pulse 86, temperature 98.4 F (36.9 C), temperature source Oral, resp. rate 16, height 5\' 7"  (1.702 m), weight 86.2 kg, SpO2 95 %.  See their HPI for full report but in brief lactate is downtrending with fluids.  Patient not have any additional vomiting.  CT scan was concerning for moderate to low-grade small bowel obstruction.  Will discuss with hospital team for admission.  Admit to the hospital.       Vanessa Valentine, MD 08/13/19 [phone]

## 2019-08-13 NOTE — ED Notes (Signed)
Pt ambulated to restroom with some difficulty.

## 2019-08-13 NOTE — ED Notes (Signed)
Pt resting on stretcher watching tv. States she is hurting and wanting pain medication. Awaiting IV team consult. Provided for comfort and safety and will continue to assess.

## 2019-08-13 NOTE — H&P (Addendum)
[facility] History and Physical   Patient: Tamara Hall:[id number]   PCP: System, Pcp Not In DOB: 1956/09/13   DOA: 08/12/2019   DOS: 08/13/2019   DOS: the patient was seen and examined on 08/13/2019  Patient coming from: The patient is coming from Home  Chief Complaint: Abdominal pain and nausea and vomiting  HPI: LORILEE CAFARELLA is a 63 y.o. female with Past medical history of rectal cancer SP colostomy creation, HTN, drug-induced polyneuropathy,Restless leg syndrome, GERD. Presented with complaints of diffuse abdominal pain ongoing since this morning. Patient mentions that she started having complaints of nausea since yesterday.  Had multiple ulcers of vomiting yesterday. This morning when she woke severe abdominal pain and the pain was not tolerable and therefore she decided to come to the hospital. At the time of my evaluation she mentions that the pain is the help of pain medication. Pain she is reporting as twisting. She is continues having pain early in the morning but now intermittent in nature.  She still has output coming from her colostomy bag.  No blood in the stool.  ED Course: CT abdomen shows evidence of parastomal hernia with small bowel as well as multiple small bowel loops which are dilated..  Patient.  Concern for ileus versus small bowel obstruction and patient was referred for admission.  At her baseline ambulates without assistance independent for most of her ADL;  manages her medication on her own.  Review of Systems: as mentioned in the history of present illness.  All other systems reviewed and are negative.  Past Medical History:  Diagnosis Date   Cancer ([place])    colon   Hypertension    Pneumonia    20+ years ago   Past Surgical History:  Procedure Laterality Date   ABDOMINAL HYSTERECTOMY     BREAST SURGERY     COLONOSCOPY WITH PROPOFOL N/A 01/14/2017   Procedure: COLONOSCOPY WITH PROPOFOL;  Surgeon: Lucilla Lame, MD;  Location: [facility]  ENDOSCOPY;  Service: Endoscopy;  Laterality: N/A;   ELBOW SURGERY Right    Has had 4 surgeries on right elbow.   HAND SURGERY Right 2008   JOINT REPLACEMENT     PORTA CATH INSERTION N/A 01/29/2017   Procedure: Glori Luis Cath Insertion;  Surgeon: Algernon Huxley, MD;  Location: [facility];  Service: Cardiovascular;  Laterality: N/A;   TONSILLECTOMY     Social History:  reports that she has been smoking cigarettes. She has a 80.00 pack-year smoking history. She has never used smokeless tobacco. She reports that she does not drink alcohol or use drugs.  No Known Allergies  Family history reviewed and not pertinent Family History  Problem Relation Age of Onset   Lung cancer Mother    Lung cancer Father    Brain cancer Maternal Grandmother      Prior to Admission medications   Medication Sig Start Date End Date Taking? Authorizing Provider  amLODipine (NORVASC) 5 MG tablet Take 5 mg by mouth daily. 08/02/19  Yes [provider]  baclofen (LIORESAL) 10 MG tablet Take 10 mg by mouth 3 (three) times daily. 07/21/19 08/20/19 Yes [provider]  gabapentin (NEURONTIN) 300 MG capsule TAKE 1 CAPSULE BY MOUTH THREE TIMES A DAY 03/16/18  Yes Karen Kitchens, NP  hydrochlorothiazide (HYDRODIURIL) 25 MG tablet Take 25 mg by mouth daily. 06/16/19 06/15/20 Yes [provider]  HYDROcodone-acetaminophen (NORCO/VICODIN) 5-325 MG tablet Take 1 tablet by mouth every 6 (six) hours as needed for  moderate pain. 05/10/18  Yes Johnn Hai, PA-C  nortriptyline (PAMELOR) 10 MG capsule Take 10 mg by mouth at bedtime. 07/16/19  Yes [provider]    Physical Exam: Vitals:   08/13/19 0236 08/13/19 0337 08/13/19 1153 08/13/19 1500  BP: 133/73 121/65 136/65 106/62  Pulse: 93 79 77 80  Resp: 17 16 16 18   Temp:      TempSrc:      SpO2: 96% 95% 97% 97%  Weight:      Height:        General: alert and oriented to time, place, and person. Appear in mild distress,  affect appropriate Eyes: PERRL, Conjunctiva normal ENT: Oral Mucosa Clear, moist  Neck: no JVD, no Abnormal Mass Or lumps Cardiovascular: S1 and S2 Present, aortic systolic  Murmur, peripheral pulses symmetrical Respiratory: good respiratory effort, Bilateral Air entry equal and Decreased, no signs of accessory muscle use, Clear to Auscultation, no Crackles, no wheezes Abdomen: Bowel Sound present, Soft and no tenderness, no hernia Skin: no rashes  Extremities: no Pedal edema, no calf tenderness Neurologic: without any new focal findings Gait not checked due to patient safety concerns  Data Reviewed: I have personally reviewed and interpreted labs, imaging as discussed below.  CBC: Recent Labs  Lab 08/12/19 2204  WBC 12.4*  NEUTROABS 10.6*  HGB 16.2*  HCT 46.9*  MCV 91.6  PLT 923   Basic Metabolic Panel: Recent Labs  Lab 08/12/19 2204  NA 141  K 3.2*  CL 95*  CO2 30  GLUCOSE 159*  BUN 14  CREATININE 1.06*  CALCIUM 10.0   GFR: Estimated Creatinine Clearance: 61.2 mL/min (A) (by C-G formula based on SCr of 1.06 mg/dL (H)). Liver Function Tests: Recent Labs  Lab 08/12/19 2204  AST 28  ALT 19  ALKPHOS 107  BILITOT 0.6  PROT 7.5  ALBUMIN 4.2   Recent Labs  Lab 08/12/19 2204  LIPASE 28   No results for input(s): AMMONIA in the last 168 hours. Coagulation Profile: No results for input(s): INR, PROTIME in the last 168 hours. Cardiac Enzymes: No results for input(s): CKTOTAL, CKMB, CKMBINDEX, TROPONINI in the last 168 hours. BNP (last 3 results) No results for input(s): PROBNP in the last 8760 hours. HbA1C: No results for input(s): HGBA1C in the last 72 hours. CBG: No results for input(s): GLUCAP in the last 168 hours. Lipid Profile: No results for input(s): CHOL, HDL, LDLCALC, TRIG, CHOLHDL, LDLDIRECT in the last 72 hours. Thyroid Function Tests: No results for input(s): TSH, T4TOTAL, FREET4, T3FREE, THYROIDAB in the last 72 hours. Anemia Panel: No  results for input(s): VITAMINB12, FOLATE, FERRITIN, TIBC, IRON, RETICCTPCT in the last 72 hours. 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 The patient underwent prior microwave ablation. EXAM: CT ABDOMEN AND PELVIS WITH CONTRAST TECHNIQUE: Multidetector CT imaging of the abdomen and pelvis was performed using the standard protocol following bolus administration of intravenous  contrast. CONTRAST:  185mL OMNIPAQUE IOHEXOL 300 MG/ML  SOLN COMPARISON:  Feb 17, 2018 FINDINGS: Lower chest: The lung bases are clear. The heart size is normal. Hepatobiliary: Again noted is an ablation defect in hepatic segment 4A. The ablation defect has decreased over time. There are numerable small calcifications throughout the liver. There is hepatic steatosis. Normal gallbladder.There is no biliary ductal dilation. Pancreas: Normal contours without ductal dilatation. No peripancreatic fluid collection. Spleen: There are numerable calcifications in the spleen. Adrenals/Urinary Tract: --Adrenal glands: No adrenal hemorrhage. --Right kidney/ureter: No hydronephrosis or perinephric hematoma. --Left kidney/ureter: No hydronephrosis or perinephric hematoma. --Urinary bladder: There is mild diffuse bladder wall thickening. Stomach/Bowel: --Stomach/Duodenum: No hiatal hernia or other gastric abnormality. Normal duodenal course and caliber.  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 However, there are dilated loops of small bowel beyond this location which may be secondary to an ileus. --Colon: There is a left lower quadrant colostomy without evidence for an obstruction. There is scattered colonic diverticula involving the remaining portions of the colon. --Appendix: Not visualized. No right lower quadrant inflammation or free fluid. Vascular/Lymphatic: There are atherosclerotic changes of the abdominal aorta with a 3.5 cm infrarenal abdominal aortic aneurysm. --No retroperitoneal lymphadenopathy. --No mesenteric lymphadenopathy. --No pelvic or inguinal lymphadenopathy. Reproductive: Unremarkable Other: There are extensive postsurgical changes in the pelvis with persistent presacral soft tissue. There is no abscess. Musculoskeletal. There are bilateral sacral insufficiency fractures. There are extensive sclerotic areas throughout the sacrum which may represent post treatment changes. IMPRESSION: 1. 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 However, there are dilated loops of small bowel beyond this location which may be secondary to an ileus or partial small bowel obstruction. 2. Left lower quadrant colostomy. 3. Hepatic steatosis. 4. Post treatment changes in the pelvis with persistent presacral soft tissue. 5. Bilateral sacral insufficiency fractures. 6. Aortic atherosclerosis with a 3.5 cm infrarenal abdominal aortic aneurysm. Recommend followup by ultrasound in 2 years. This recommendation follows ACR consensus guidelines: White Paper of the ACR Incidental Findings Committee II on Vascular Findings. J Am Coll Radiol 2013; 10:789-794. Aortic aneurysm NOS (ICD10-I71.9) Aortic Atherosclerosis (ICD10-I70.0). Electronically Signed   By: Constance Holster M.D.    On: 08/13/2019 00:43   I reviewed all nursing notes, pharmacy notes, vitals, pertinent old records.  Assessment/Plan 1.  Small bowel obstruction Currently not having any nausea or vomiting. General surgery consulted. Patient is already having output from colostomy. Conservative management with IV fluids, IV pain medication as well as IV nausea medication. Monitor response. Currently remains n.p.o. No indication for NG tube insertion given no other nausea. Appreciate general surgery input.  Patient will require outpatient follow-up with her surgeon for hernia repair.  2.  Pain control. Patient is on gabapentin and baclofen at home which I will continue. Continue morphine for now for pain control.   3.  Mood disorder. Continue nortriptyline  4.  Essential hypertension Blood pressure stable for now. Currently holding Norvasc.  5.  Drug-induced neuropathy. Continue gabapentin.  6.  Hypokalemia. Replacing with IV.  7.  Acute kidney injury. Continue with IV hydration. Monitor renal function.   Nutrition: NPO  DVT Prophylaxis: Subcutaneous Lovenox  Advance goals of care discussion: Full code   Consults: General surgery    Family Communication: no family was present at bedside, at the time  of interview.  Disposition: Admitted as inpatient, med-surge unit. Likely to be discharged home, in 2-3 days.  I have discussed plan of care as described above with RN and patient/family.  Author: Berle Mull, MD [facility] 08/13/2019 4:44 PM   To reach On-call, see care teams to locate the attending and reach out to them via [email or web address]. If 7PM-7AM, please contact night-coverage If you still have difficulty reaching the attending provider, please page the [facility] (Director on Call) for [facility] on amion for assistance.

## 2019-08-14 ENCOUNTER — Other Ambulatory Visit: Payer: Self-pay

## 2019-08-14 DIAGNOSIS — K56609 Unspecified intestinal obstruction, unspecified as to partial versus complete obstruction: Secondary | ICD-10-CM

## 2019-08-14 LAB — HIV ANTIBODY (ROUTINE TESTING W REFLEX): HIV Screen 4th Generation wRfx: NONREACTIVE

## 2019-08-14 LAB — CBC
HCT: 36.9 % (ref 36.0–46.0)
Hemoglobin: 12.4 g/dL (ref 12.0–15.0)
MCH: 31.7 pg (ref 26.0–34.0)
MCHC: 33.6 g/dL (ref 30.0–36.0)
MCV: 94.4 fL (ref 80.0–100.0)
Platelets: 247 10*3/uL (ref 150–400)
RBC: 3.91 MIL/uL (ref 3.87–5.11)
RDW: 12.3 % (ref 11.5–15.5)
WBC: 4.4 10*3/uL (ref 4.0–10.5)
nRBC: 0 % (ref 0.0–0.2)

## 2019-08-14 LAB — COMPREHENSIVE METABOLIC PANEL
ALT: 16 U/L (ref 0–44)
AST: 18 U/L (ref 15–41)
Albumin: 3.5 g/dL (ref 3.5–5.0)
Alkaline Phosphatase: 79 U/L (ref 38–126)
Anion gap: 10 (ref 5–15)
BUN: 8 mg/dL (ref 8–23)
CO2: 30 mmol/L (ref 22–32)
Calcium: 8.8 mg/dL — ABNORMAL LOW (ref 8.9–10.3)
Chloride: 103 mmol/L (ref 98–111)
Creatinine, Ser: 0.64 mg/dL (ref 0.44–1.00)
GFR calc Af Amer: >60 mL/min (ref >60)
GFR calc non Af Amer: >60 mL/min (ref >60)
Glucose, Bld: 106 mg/dL — ABNORMAL HIGH (ref 70–99)
Potassium: 3.5 mmol/L (ref 3.5–5.1)
Sodium: 143 mmol/L (ref 135–145)
Total Bilirubin: 0.8 mg/dL (ref 0.3–1.2)
Total Protein: 6.2 g/dL — ABNORMAL LOW (ref 6.5–8.1)

## 2019-08-14 LAB — GLUCOSE, CAPILLARY
Glucose-Capillary: 103 mg/dL — ABNORMAL HIGH (ref 70–99)
Glucose-Capillary: 116 mg/dL — ABNORMAL HIGH (ref 70–99)

## 2019-08-14 LAB — MAGNESIUM: Magnesium: 2.3 mg/dL (ref 1.7–2.4)

## 2019-08-14 MED ORDER — AMLODIPINE BESYLATE 5 MG PO TABS
5.0000 mg | ORAL_TABLET | Freq: Every day | ORAL | Status: DC
  Administered 2019-08-14 – 2019-08-15 (×2): 5 mg via ORAL
  Filled 2019-08-14 (×2): qty 1

## 2019-08-14 MED ORDER — GABAPENTIN 300 MG PO CAPS
600.0000 mg | ORAL_CAPSULE | Freq: Three times a day (TID) | ORAL | Status: DC
  Administered 2019-08-14 – 2019-08-15 (×2): 600 mg via ORAL
  Filled 2019-08-14: qty 6
  Filled 2019-08-14: qty 2

## 2019-08-14 MED ORDER — HYDROCODONE-ACETAMINOPHEN 10-325 MG PO TABS: 1.0000 | ORAL_TABLET | Freq: Three times a day (TID) | ORAL | Status: DC | PRN

## 2019-08-14 NOTE — Progress Notes (Signed)
PROGRESS NOTE  [name]  DOB: 15-Aug-1956  PCP: System, Pcp Not In YQM:[id number]  DOA: 08/12/2019  LOS: 1 day   Brief narrative: Tamara Hall is a 63 y.o. 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 Patient presented to the ED on 08/12/2019 with complaint of severe abdominal pain, unable to tolerate associated with nausea, vomiting.  In the ED, patient was afebrile, hemodynamically stable. CT abdomen shows evidence of parastomal hernia with small bowel as well as multiple small bowel loops which are dilated.  Patient was admitted to hospitalist medicine service for suspicion of bowel obstruction.  Surgery consultation was obtained.  Subjective: Patient was seen and examined this morning.  Pleasant middle-aged Caucasian female.  Looks older for her age.  Walking inside her room, she states she is trying to control pain by distracting herself. Has colostomy bag in place with liquid output and gas.  Assessment/Plan: Intractable abdominal pain  -Multifactorial: Bowel obstruction, stage IV rectal cancer, parastomal hernia.  She recently had elevated CEA which could suggest peritoneal implants as well. -Currently pain controlled with oral Norco.  Small bowel obstruction -Initial impression and CT scan of abdomen.  General surgery consult obtained.  Patient is already starting to have output in her colostomy bag. -No anticipation of surgical intervention in this hospitalization.   -Clear liquid diet started by general surgery.  Essential hypertension  -Resume Norvasc. -HCTZ remain on hold.  Continue IV fluid at 75 mL/h for now.  Hypokalemia -Potassium improved from 3.2 yesterday to 3.5 today.  Magnesium level normal.  Continue to monitor.   Stage IV rectal cancer -Status post colostomy, chemotherapy and radiation.  Drug-induced polyneuropathy  -Takes a total of 3600 mg daily of Neurontin at home.  Continue at reduced dose.  Chronic back pain -Takes Norco at home. -CT abdomen showed bilateral sacral insufficiency fractures. -Continue pain medicine.  Mood disorder -Continue nortriptyline  Mobility: Encourage ambulation DVT prophylaxis:  Lovenox subcu Code Status:   Code Status: Full Code  Family Communication:  Expected Discharge:  Pending neurosurgery clearance  Consultants:  General surgery  Procedures:  None  Antimicrobials: Anti-infectives (From admission, onward)   Start     Dose/Rate Route Frequency Ordered Stop   08/13/19 0000  piperacillin-tazobactam (ZOSYN) IVPB 3.375 g     3.375 g 12.5 mL/hr over 240 Minutes Intravenous  Once 08/12/19 2345 08/13/19 0451      Diet Order            Diet clear liquid Room service appropriate? Yes; Fluid consistency: Thin  Diet effective now              Infusions:  . dextrose 5 % and 0.45 % NaCl with KCl 20 mEq/L 75 mL/hr at 08/14/19 5284    Scheduled Meds: . baclofen  10 mg Oral TID  . Chlorhexidine Gluconate Cloth  6 each Topical Daily  . enoxaparin (LOVENOX) injection  40 mg Subcutaneous Q24H  . gabapentin  300 mg Oral TID  . nortriptyline  10 mg Oral QHS  . sodium chloride flush  10-40 mL Intracatheter Q12H    PRN meds: acetaminophen **OR** acetaminophen, bisacodyl, iohexol, morphine injection, ondansetron **OR** ondansetron (ZOFRAN) IV   Objective: Vitals:   08/14/19 0405 08/14/19 0855  BP: (!) 119/50 (!) 140/58  Pulse: 71 81  Resp: 20   Temp: 97.8 F (36.6 C)   SpO2: 96%  Intake/Output Summary (Last 24 hours) at 08/14/2019 1304 Last data filed at 08/13/2019 2244 Gross per 24 hour  Intake 10 ml  Output -  Net 10 ml   Filed Weights   08/12/19 2150  Weight: 86.2 kg   Weight change:  Body mass index is 29.76 kg/m.   Physical Exam: General exam: Mild distress because of pain Skin: No rashes, lesions or ulcers. HEENT: Atraumatic,  normocephalic, supple neck, no obvious bleeding Lungs: Clear to auscultation bilaterally CVS: Regular rate and rhythm, no murmur GI/Abd soft, mild diffuse tenderness, colostomy bag in left lower quadrant with gas and liquid stool CNS: Alert, awake, oriented x3 Psychiatry: Mood appropriate Extremities: No pedal edema, no calf tenderness  Data Review: I have personally reviewed the laboratory data and studies available.  Recent Labs  Lab 08/12/19 2204 08/14/19 0640  WBC 12.4* 4.4  NEUTROABS 10.6*  --   HGB 16.2* 12.4  HCT 46.9* 36.9  MCV 91.6 94.4  PLT 349 247   Recent Labs  Lab 08/12/19 2204 08/14/19 0640  NA 141 143  K 3.2* 3.5  CL 95* 103  CO2 30 30  GLUCOSE 159* 106*  BUN 14 8  CREATININE 1.06* 0.64  CALCIUM 10.0 8.8*  MG  --  2.3    Terrilee Croak, MD  [facility] 08/14/2019

## 2019-08-14 NOTE — Progress Notes (Signed)
[place] Hospital Day(s): 1.   Post op day(s):  Marland Kitchen   Interval History: Patient seen and examined, no acute events or new complaints overnight. Patient reports feeling better today.  She reports the pain has improved.  She denies any pain radiation.  There is no alleviating or quadrant factor.  She denies nausea or vomiting.  She reports that the ostomy continue to work with a lot of gas and stool.  Vital signs in last 24 hours: [min-max] current  Temp:  [97.8 F (36.6 C)-98.3 F (36.8 C)] 97.8 F (36.6 C) (11/07 0405) Pulse Rate:  [70-81] 81 (11/07 0855) Resp:  [16-20] 20 (11/07 0405) BP: (106-140)/(50-65) 140/58 (11/07 0855) SpO2:  [95 %-98 %] 96 % (11/07 0405)     Height: 5\' 7"  (170.2 cm) Weight: 86.2 kg BMI (Calculated): 29.75   Ostomy output: Not charted.  There is significant amount of stool and gas in the colostomy bag.  Physical Exam:  Constitutional: alert, cooperative and no distress  Respiratory: breathing non-labored at rest  Cardiovascular: regular rate and sinus rhythm  Gastrointestinal: soft, non-tender, and non-distended.  Ostomy pink and patent  Labs:  CBC Latest Ref Rng & Units 08/14/2019 08/12/2019 07/16/2018  WBC 4.0 - 10.5 K/uL 4.4 12.4(H) 9.5  Hemoglobin 12.0 - 15.0 g/dL 12.4 16.2(H) 12.8  Hematocrit 36.0 - 46.0 % 36.9 46.9(H) 38.8  Platelets 150 - 400 K/uL 247 349 255   CMP Latest Ref Rng & Units 08/14/2019 08/12/2019 07/16/2018  Glucose 70 - 99 mg/dL 106(H) 159(H) 94  BUN 8 - 23 mg/dL 8 14 16   Creatinine 0.44 - 1.00 mg/dL 0.64 1.06(H) 0.67  Sodium 135 - 145 mmol/L 143 141 138  Potassium 3.5 - 5.1 mmol/L 3.5 3.2(L) 3.8  Chloride 98 - 111 mmol/L 103 95(L) 100  CO2 22 - 32 mmol/L 30 30 27   Calcium 8.9 - 10.3 mg/dL 8.8(L) 10.0 8.3(L)  Total Protein 6.5 - 8.1 g/dL 6.2(L) 7.5 6.5  Total Bilirubin 0.3 - 1.2 mg/dL 0.8 0.6 0.6  Alkaline Phos 38 - 126 U/L 79 107 105  AST 15 - 41 U/L 18 28 19   ALT 0 - 44 U/L 16 19 15     Imaging studies: No  new pertinent imaging studies   Assessment/Plan:  63 y.o. female with small bowel obstruction, complicated by pertinent comorbidities including stage IV rectal cancer, hypertension, hypertension. Patient with what looks it is a resolving partial small bowel obstruction.    She continued having ostomy output and gas.  She does not have nausea or vomiting.  I ordered clear liquid diet.  This can be advanced as tolerated.  Agree with current medical management.  We will continue to follow to assist in the treatment of the bowel obstruction.  Pain can be multifactorial.  Some of it was from the distention, other structural pain could be worsening stage IV rectal cancer.  Pain management as per primary team.  Encourage patient to ambulate.  Arnold Long, MD

## 2019-08-15 MED ORDER — HEPARIN SOD (PORK) LOCK FLUSH 100 UNIT/ML IV SOLN
500.0000 [IU] | Freq: Once | INTRAVENOUS | Status: AC
  Administered 2019-08-15: 500 [IU] via INTRAVENOUS
  Filled 2019-08-15 (×2): qty 5

## 2019-08-15 NOTE — Progress Notes (Signed)
Verbal order placed for heparin flush to de-access chest port for discharge.

## 2019-08-15 NOTE — Discharge Summary (Signed)
Physician Discharge Summary  Tamara Hall VHQ:[id number] DOB: 03/24/1956 DOA: 08/12/2019  PCP: System, Pcp Not In  Admit date: 08/12/2019 Discharge date: 08/15/2019  Admitted From: Home Discharge disposition: Home   Code Status: Full Code  Diet Recommendation: Soft diet  Recommendations for Outpatient Follow-Up:   1. Follow-up with primary care provider 2. Follow-up with general surgery as an outpatient for stomal hernia.  Discharge Diagnosis:   Active Problems:   SBO (small bowel obstruction) (HCC)  History of Present Illness / Brief narrative:  Tamara Hall is a 63 y.o. 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 Patient presented to the ED on 08/12/2019 with complaint of severe abdominal pain, unable to tolerate associated with nausea, vomiting.  In the ED, patient was afebrile, hemodynamically stable. CT abdomen shows evidence of parastomal hernia with small bowel as well as multiple small bowel loops which are dilated.  Patient was admitted to hospitalist medicine service for suspicion of bowel obstruction.   Surgery consultation was obtained. No surgical intervention was required.  Hospital Course:  Intractable abdominal pain  -Multifactorial: Bowel obstruction, stage IV rectal cancer, parastomal hernia.  She recently had elevated CEA which could suggest peritoneal implants as well. -Currently pain controlled with oral Norco.  Continue same at home.  Small bowel obstruction -Initial impression and CT scan of abdomen.  General surgery consult obtained.  Next day, patient started to have output in her colostomy bag. -No requirement of surgical intervention in this hospitalization.   -Advance to soft diet today by surgery.  Tolerated.  Essential hypertension  -Continue Norvasc and HCTZ at home.  Hypokalemia -Improved with  replacement.  Stage IV rectal cancer -Status post colostomy, chemotherapy and radiation. -She recently had elevated CEA which could suggest peritoneal implants as well.  Follow-up with oncology as an outpatient.  Drug-induced polyneuropathy -Takes a total of 3600 mg daily of Neurontin at home.  Currently at a reduced dose.  Resume home dose post discharge.  Recommend to cut back in the dose as tolerated.  Chronic back pain -Takes Norco at home. -CT abdomen showed bilateral sacral insufficiency fractures. -Continue pain medicine.  Mood disorder -Continue nortriptyline  Stable to discharge to home today.  Subjective:  Seen and examined this morning.  Pleasant middle-aged Caucasian female.  Lying down in bed.  Not in distress.  Also ambulating in the hallway multiple times this morning.  Feels better.  Pain controlled.  Trying soft diet today.  If able to tolerate, feels ready to go home today.  Discharge Exam:   Vitals:   08/14/19 0855 08/14/19 1616 08/15/19 0436 08/15/19 0455  BP: (!) 140/58 (!) 145/67 (!) 141/74   Pulse: 81  76   Resp:  20 17   Temp:  97.9 F (36.6 C) (!) 97.5 F (36.4 C)   TempSrc:  Oral Oral   SpO2:  98% 99%   Weight:    90.2 kg  Height:        Body mass index is 31.14 kg/m.  General exam: Appears calm and comfortable.  Skin: No rashes, lesions or ulcers. HEENT: Atraumatic, normocephalic, supple neck, no obvious bleeding Lungs: Clear to auscultation bilaterally CVS: Regular rate and rhythm, no murmur GI/Abd soft, nondistended, bowel sound present, stoma site intact, gas/liquid content in the back. CNS: Alert, awake, oriented x3 Psychiatry: Mood appropriate Extremities: No pedal edema, no calf tenderness  Discharge  Instructions:  Wound care: continue stoma care at home.  Discharge Instructions    Increase activity slowly   Complete by: As directed       Allergies as of 08/15/2019   No Known Allergies     Medication List    TAKE  these medications   amLODipine 5 MG tablet Commonly known as: NORVASC Take 5 mg by mouth daily.   baclofen 10 MG tablet Commonly known as: LIORESAL Take 10 mg by mouth 3 (three) times daily.   gabapentin 300 MG capsule Commonly known as: NEURONTIN TAKE 1 CAPSULE BY MOUTH THREE TIMES A DAY   hydrochlorothiazide 25 MG tablet Commonly known as: HYDRODIURIL Take 25 mg by mouth daily.   HYDROcodone-acetaminophen 5-325 MG tablet Commonly known as: NORCO/VICODIN Take 1 tablet by mouth every 6 (six) hours as needed for moderate pain.   nortriptyline 10 MG capsule Commonly known as: PAMELOR Take 10 mg by mouth at bedtime.       Time coordinating discharge: 25 minutes  The results of significant diagnostics from this hospitalization (including imaging, microbiology, ancillary and laboratory) are listed below for reference.    Procedures and Diagnostic Studies:   Ct Abdomen Pelvis W Contrast  Result Date: 08/13/2019 CLINICAL DATA:  Abdominal distension history of stage IV rectal cancer with hepatic metastases. The patient underwent prior microwave ablation. EXAM: CT ABDOMEN AND PELVIS WITH CONTRAST TECHNIQUE: Multidetector CT imaging of the abdomen and pelvis was performed using the standard protocol following bolus administration of intravenous contrast. CONTRAST:  167mL OMNIPAQUE IOHEXOL 300 MG/ML  SOLN COMPARISON:  Feb 17, 2018 FINDINGS: Lower chest: The lung bases are clear. The heart size is normal. Hepatobiliary: Again noted is an ablation defect in hepatic segment 4A. The ablation defect has decreased over time. There are numerable small calcifications throughout the liver. There is hepatic steatosis. Normal gallbladder.There is no biliary ductal dilation. Pancreas: Normal contours without ductal dilatation. No peripancreatic fluid collection. Spleen: There are numerable calcifications in the spleen. Adrenals/Urinary Tract: --Adrenal glands: No adrenal hemorrhage. --Right  kidney/ureter: No hydronephrosis or perinephric hematoma. --Left kidney/ureter: No hydronephrosis or perinephric hematoma. --Urinary bladder: There is mild diffuse bladder wall thickening. Stomach/Bowel: --Stomach/Duodenum: No hiatal hernia or other gastric abnormality. Normal duodenal course and caliber. 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 However, there are dilated loops of small bowel beyond this location which may be secondary to an ileus. --Colon: There is a left lower quadrant colostomy without evidence for an obstruction. There is scattered colonic diverticula involving the remaining portions of the colon. --Appendix: Not visualized. No right lower quadrant inflammation or free fluid. Vascular/Lymphatic: There are atherosclerotic changes of the abdominal aorta with a 3.5 cm infrarenal abdominal aortic aneurysm. --No retroperitoneal lymphadenopathy. --No mesenteric lymphadenopathy. --No pelvic or inguinal lymphadenopathy. Reproductive: Unremarkable Other: There are extensive postsurgical changes in the pelvis with persistent presacral soft tissue. There is no abscess. Musculoskeletal. There are bilateral sacral insufficiency fractures. There are extensive sclerotic areas throughout the sacrum which may represent post treatment changes. IMPRESSION: 1. 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 However, there are dilated loops of small bowel beyond this location which may be secondary to an ileus or partial small bowel obstruction. 2. Left lower quadrant colostomy. 3. Hepatic steatosis. 4. Post treatment changes in the pelvis with persistent presacral soft tissue.  5. Bilateral sacral insufficiency fractures. 6. Aortic atherosclerosis with a 3.5 cm infrarenal abdominal aortic aneurysm. Recommend followup by ultrasound  in 2 years. This recommendation follows ACR consensus guidelines: White Paper of the ACR Incidental Findings Committee II on Vascular Findings. J Am Coll Radiol 2013; 10:789-794. Aortic aneurysm NOS (ICD10-I71.9) Aortic Atherosclerosis (ICD10-I70.0). Electronically Signed   By: Constance Holster M.D.   On: 08/13/2019 00:43     Labs:   Basic Metabolic Panel: Recent Labs  Lab 08/12/19 2204 08/14/19 0640  NA 141 143  K 3.2* 3.5  CL 95* 103  CO2 30 30  GLUCOSE 159* 106*  BUN 14 8  CREATININE 1.06* 0.64  CALCIUM 10.0 8.8*  MG  --  2.3   GFR Estimated Creatinine Clearance: 82.9 mL/min (by C-G formula based on SCr of 0.64 mg/dL). Liver Function Tests: Recent Labs  Lab 08/12/19 2204 08/14/19 0640  AST 28 18  ALT 19 16  ALKPHOS 107 79  BILITOT 0.6 0.8  PROT 7.5 6.2*  ALBUMIN 4.2 3.5   Recent Labs  Lab 08/12/19 2204  LIPASE 28   No results for input(s): AMMONIA in the last 168 hours. Coagulation profile No results for input(s): INR, PROTIME in the last 168 hours.  CBC: Recent Labs  Lab 08/12/19 2204 08/14/19 0640  WBC 12.4* 4.4  NEUTROABS 10.6*  --   HGB 16.2* 12.4  HCT 46.9* 36.9  MCV 91.6 94.4  PLT 349 247   Cardiac Enzymes: No results for input(s): CKTOTAL, CKMB, CKMBINDEX, TROPONINI in the last 168 hours. BNP: Invalid input(s): POCBNP CBG: Recent Labs  Lab 08/14/19 1152 08/14/19 1653  GLUCAP 103* 116*   D-Dimer No results for input(s): DDIMER in the last 72 hours. Hgb A1c No results for input(s): HGBA1C in the last 72 hours. Lipid Profile No results for input(s): CHOL, HDL, LDLCALC, TRIG, CHOLHDL, LDLDIRECT in the last 72 hours. Thyroid function studies No results for input(s): TSH, T4TOTAL, T3FREE, THYROIDAB in the last 72 hours.  Invalid input(s): FREET3 Anemia work up No results for input(s): VITAMINB12, FOLATE, FERRITIN, TIBC, IRON, RETICCTPCT in the last 72 hours. Microbiology Recent Results (from the past 240 hour(s))  Blood culture  (routine x 2)     Status: None (Preliminary result)   Collection Time: 08/13/19 12:49 AM   Specimen: BLOOD  Result Value Ref Range Status   Specimen Description BLOOD LEFT ASSIST CONTROL  Final   Special Requests   Final    BOTTLES DRAWN AEROBIC AND ANAEROBIC Blood Culture results may not be optimal due to an excessive volume of blood received in culture bottles   Culture   Final    NO GROWTH 2 DAYS Performed at [facility], [address] [place], [place] [address]    Report Status PENDING  Incomplete  Blood culture (routine x 2)     Status: None (Preliminary result)   Collection Time: 08/13/19 12:50 AM   Specimen: BLOOD  Result Value Ref Range Status   Specimen Description BLOOD RIGHT HAND  Final   Special Requests   Final    BOTTLES DRAWN AEROBIC AND ANAEROBIC Blood Culture adequate volume   Culture   Final    NO GROWTH 2 DAYS Performed at [facility], [address] [place], [place] [address]    Report Status PENDING  Incomplete  SARS CORONAVIRUS 2 (TAT 6-24 HRS) Nasopharyngeal Nasopharyngeal Swab     Status: None   Collection Time: 08/13/19  1:53 AM   Specimen: Nasopharyngeal Swab  Result Value Ref Range Status   SARS Coronavirus 2 NEGATIVE NEGATIVE Final    Comment: (NOTE) SARS-CoV-2 target nucleic acids are NOT DETECTED. The SARS-CoV-2 RNA is generally detectable in upper and lower respiratory specimens during the acute phase of infection. Negative results do not preclude SARS-CoV-2 infection, do not rule out co-infections with other pathogens, and should not be used as the sole basis for treatment or other patient management decisions. Negative results must be combined with clinical observations, patient history, and epidemiological information. The expected result is Negative. Fact Sheet for Patients: SugarRoll.be Fact Sheet for Healthcare Providers: https://www.woods-mathews.com/ This test is not yet  approved or cleared by the [place] FDA and  has been authorized for detection and/or diagnosis of SARS-CoV-2 by FDA under an Emergency Use Authorization (EUA). This EUA will remain  in effect (meaning this test can be used) for the duration of the COVID-19 declaration under Section 56 4(b)(1) of the Act, 21 U.S.C. section 360bbb-3(b)(1), unless the authorization is terminated or revoked sooner. Performed at [facility], [place] [address], [place], [place] [address]     Please note: You were cared for by a hospitalist during your hospital stay. Once you are discharged, your primary care physician will handle any further medical issues. Please note that NO REFILLS for any discharge medications will be authorized once you are discharged, as it is imperative that you return to your primary care physician (or establish a relationship with a primary care physician if you do not have one) for your post hospital discharge needs so that they can reassess your need for medications and monitor your lab values.  Signed: Terrilee Croak  [facility] 08/15/2019, 11:12 AM

## 2019-08-15 NOTE — Progress Notes (Signed)
[facility] Hospital Day(s): 2.   Post op day(s):  Marland Kitchen   Interval History: Patient seen and examined, no acute events or new complaints overnight. Patient reports pain is better today.  She denies nausea or vomiting.  She reports he tolerated diet yesterday.  There is no pain radiation.  There is no alleviating or aggravating factor.  There has been stool in the bag.  Vital signs in last 24 hours: [min-max] current  Temp:  [97.5 F (36.4 C)-97.9 F (36.6 C)] 97.5 F (36.4 C) (11/08 0436) Pulse Rate:  [76] 76 (11/08 0436) Resp:  [17-20] 17 (11/08 0436) BP: (141-145)/(67-74) 141/74 (11/08 0436) SpO2:  [98 %-99 %] 99 % (11/08 0436) Weight:  [90.2 kg] 90.2 kg (11/08 0455)     Height: 5\' 7"  (170.2 cm) Weight: 90.2 kg BMI (Calculated): 31.13   Physical Exam:  Constitutional: alert, cooperative and no distress  Respiratory: breathing non-labored at rest  Cardiovascular: regular rate and sinus rhythm  Gastrointestinal: soft, non-tender, and non-distended  Labs:  CBC Latest Ref Rng & Units 08/14/2019 08/12/2019 07/16/2018  WBC 4.0 - 10.5 K/uL 4.4 12.4(H) 9.5  Hemoglobin 12.0 - 15.0 g/dL 12.4 16.2(H) 12.8  Hematocrit 36.0 - 46.0 % 36.9 46.9(H) 38.8  Platelets 150 - 400 K/uL 247 349 255   CMP Latest Ref Rng & Units 08/14/2019 08/12/2019 07/16/2018  Glucose 70 - 99 mg/dL 106(H) 159(H) 94  BUN 8 - 23 mg/dL 8 14 16   Creatinine 0.44 - 1.00 mg/dL 0.64 1.06(H) 0.67  Sodium 135 - 145 mmol/L 143 141 138  Potassium 3.5 - 5.1 mmol/L 3.5 3.2(L) 3.8  Chloride 98 - 111 mmol/L 103 95(L) 100  CO2 22 - 32 mmol/L 30 30 27   Calcium 8.9 - 10.3 mg/dL 8.8(L) 10.0 8.3(L)  Total Protein 6.5 - 8.1 g/dL 6.2(L) 7.5 6.5  Total Bilirubin 0.3 - 1.2 mg/dL 0.8 0.6 0.6  Alkaline Phos 38 - 126 U/L 79 107 105  AST 15 - 41 U/L 18 28 19   ALT 0 - 44 U/L 16 19 15     Imaging studies: No new pertinent imaging studies   Assessment/Plan:  63 y.o.femalewith small bowel obstruction, complicated by  pertinent comorbidities includingstage IV rectal cancer, hypertension, hypertension. Patient with resolving small bowel obstruction.  Patient tolerated clear liquids yesterday.  The pain has been improving.  Advance diet to full liquids.  Diet can be advanced to soft diet if tolerated breakfast.  If patient tolerates of diet she may be discharged.  If patient is discharged she will follow-up with her oncologist and surgical oncologist the performance of previous surgery.  Recommend evaluation of progressive disease and also for evaluation of the parastomal hernia.  No surgical management indicated at this moment we will continue to follow if patient while patient is in the hospital.  Arnold Long, MD

## 2019-08-15 NOTE — Progress Notes (Signed)
Tamara Hall and O X 4. VSS. Pt tolerating diet well. No complaints of pain or nausea. IV removed intact, prescriptions given. Pt voiced understanding of discharge instructions with no further questions. Pt discharged via wheelchair with RN.   Allergies as of 08/15/2019   No Known Allergies     Medication List    TAKE these medications   amLODipine 5 MG tablet Commonly known as: NORVASC Take 5 mg by mouth daily.   baclofen 10 MG tablet Commonly known as: LIORESAL Take 10 mg by mouth 3 (three) times daily.   gabapentin 300 MG capsule Commonly known as: NEURONTIN TAKE 1 CAPSULE BY MOUTH THREE TIMES Hall DAY   hydrochlorothiazide 25 MG tablet Commonly known as: HYDRODIURIL Take 25 mg by mouth daily.   HYDROcodone-acetaminophen 5-325 MG tablet Commonly known as: NORCO/VICODIN Take 1 tablet by mouth every 6 (six) hours as needed for moderate pain.   nortriptyline 10 MG capsule Commonly known as: PAMELOR Take 10 mg by mouth at bedtime.       Vitals:   08/15/19 0436 08/15/19 1448  BP: (!) 141/74 (!) 146/75  Pulse: 76 80  Resp: 17 18  Temp: (!) 97.5 F (36.4 C) 98.8 F (37.1 C)  SpO2: 99% 97%    Tamara Hall

## 2019-08-18 LAB — CULTURE, BLOOD (ROUTINE X 2)
Culture: NO GROWTH
Culture: NO GROWTH
Special Requests: ADEQUATE

## 2019-09-27 ENCOUNTER — Encounter: Payer: Self-pay | Admitting: Adult Health

## 2019-10-26 NOTE — Progress Notes (Signed)
[facility]  [address] [place], [place] [address] Phone: [phone]  Fax: [phone]   Clinic Day:  10/28/2019  Referring physician: No ref. provider found  Chief Complaint: Tamara Hall is a 64 y.o. female with stage IV rectal cancer s/p microwave hepatic ablation, APR with colostomy, and VATS wedge resection who is seen for 20 month assessment.   HPI: The patient was last seen in the medical oncology clinic on 02/18/2018. At that time, she had chronic post-operative abdominal pain. She had a colostomy.  Exam revealed tenderness around surgical sites without evidence of infection.  abdomen and pelvic CT scan revealed no evidence of recurrent disease. We discussed the need for a chest CT given prior history of metastatic disease to the chest s/p wedge resection. Patient was advised to follow up with Dr. Gaynelle Arabian.  She continued gabapentin 300 mg TID.   Labs included a hematocrit of 31.6, hemoglobin 10.3, platelets 319,000, WBC 4,600 (ANC 3,200). Ferritin was 14 with an iron saturation of 11% and a TIBC of 424.  CEA was 3.7. TSH was 1.430. Retic was1.7%.  B12 was 122 (low) and folate 12.9.   She received B12 injection weekly x 6 (02/23/2018 - 03/30/2018) then monthly B12 beginning 04/27/2018 (last).  Chest CT on 03/04/2018 revealed a stable chest CT (compared to 08/19/2017). The index right lower lobe lung nodule measured 7 mm (unchanged). There was no new or progressive nodularity identified. There was continued decrease in size of ablation defect within segment 4a of the liver.   She was admitted to [facility] from 08/12/2019 to 08/15/2019 for a possible small bowel obstruction. She had severe abdominal pain with associated nausea and vomiting.  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  General surgery was consulted.  The following day after  admission,she had output in her colostomy bag. There was no requirement for surgical intervention. She was put on a soft diet. Patient was directed to continue Norco, Norvasc and HCTZ at home.   Chest CT at [facility] on 09/09/2019 revealed no intrathoracic metastasis and no new or enlarging pulmonary nodules.  Abdomen and pelvis CT at [facility] on 09/09/2019 revealed multiple subcentimeter hypodensities in the dome of the liver, which appeared new/more conspicuous from prior CT. There were not seen on the prior MRI. Further evaluation with MRI was recommended. CT evaluation was limited due to background fatty liver and the heterogeneity/these lesions could have been secondary to fatty liver. There was unchanged soft tissue stranding and nodularity in the pelvis, with a few prominent more nodular areas. There were a few more prominent indeterminate areas and short-term follow up was recommended to ensure stability and exclude peritoneal disease.  Further evaluation with MRI could have been helpful.   Colonoscopy at [facility] on 09/13/2019 did not appear malignant with no adenomatous (granulation tissue). There was diverticulosis in the descending colon, normal mucosa in the entire examined colon and the examined portion of the ileum. There was a 9 mm lipoma in the transverse colon and a 4 mm polyp at the surgical stoma.   CEA from [facility] has been followed: 5.8 on 06/26/2018, 10.5 on 11/20/2018, 19.5 on 03/26/2019, 34.0 on 07/21/2019, 46.4 on 09/09/2019.   During the interim, she has felt "ok", but upset.  She is tearful in clinic today.  For the last 6 months, she notes her health has become worse. She notes chronic  episodic abdominal pain.  Pain is not at any particular time of day or related to any foods.  Pain radiates to her back. Pain is associated with nausea and vomiting. Pain feels like labor pains. She can last briefly or up to 4 days.  She notes trouble laying down or changing position. Pain effects her ADLs.   She  notes an abnormal gait due to back pain. She notes her issues with sciatica in the past.   Patient will see her dentist next week and needs a note for clearance.  She needs 4 teeth extracted.  Patient is not on any blood thinners. She still has her port-a-cath.   Bilateral mammogram on 09/17/2019 showed no suspicious masses or malignant calcifications.    Past Medical History:  Diagnosis Date  . Cancer ([place])    colon  . Hypertension   . Pneumonia    20+ years ago    Past Surgical History:  Procedure Laterality Date  . ABDOMINAL HYSTERECTOMY    . BREAST SURGERY    . COLONOSCOPY WITH PROPOFOL N/A 01/14/2017   Procedure: COLONOSCOPY WITH PROPOFOL;  Surgeon: Lucilla Lame, MD;  Location: [facility] ENDOSCOPY;  Service: Endoscopy;  Laterality: N/A;  . ELBOW SURGERY Right    Has had 4 surgeries on right elbow.  Marland Kitchen HAND SURGERY Right 2008  . JOINT REPLACEMENT    . PORTA CATH INSERTION N/A 01/29/2017   Procedure: Glori Luis Cath Insertion;  Surgeon: Algernon Huxley, MD;  Location: [facility];  Service: Cardiovascular;  Laterality: N/A;  . TONSILLECTOMY      Family History  Problem Relation Age of Onset  . Lung cancer Mother   . Lung cancer Father   . Brain cancer Maternal Grandmother     Social History:  reports that she has been smoking cigarettes. She has a 80.00 pack-year smoking history. She has never used smokeless tobacco. She reports that she does not drink alcohol or use drugs. She was smoking 1/2 packs/day.  She stopped smoking 10/24/2017.  She lives in [place]. She has 3 children (ages 69, 1, and 84). She smoked 2 packs a day. She started smoking at age 37. Stopped smoking on 10/24/2017. 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 [name] is her significant other.  The patient is alone today.  Allergies: No Known Allergies  Current Medications: Current Outpatient Medications  Medication Sig Dispense Refill  . amLODipine  (NORVASC) 5 MG tablet Take 5 mg by mouth daily.    . baclofen (LIORESAL) 10 MG tablet Take 10 mg by mouth 3 (three) times daily.    Marland Kitchen gabapentin (NEURONTIN) 300 MG capsule TAKE 1 CAPSULE BY MOUTH THREE TIMES A DAY 90 capsule 0  . gabapentin (NEURONTIN) 300 MG capsule Take by mouth.    . hydrochlorothiazide (HYDRODIURIL) 25 MG tablet Take 25 mg by mouth daily.    Marland Kitchen HYDROcodone-acetaminophen (NORCO/VICODIN) 5-325 MG tablet Take 1 tablet by mouth every 6 (six) hours as needed for moderate pain. 15 tablet 0  . nortriptyline (PAMELOR) 10 MG capsule Take 10 mg by mouth at bedtime.     No current facility-administered medications for this visit.    Review of Systems  Constitutional: Negative for chills, diaphoresis, fever, malaise/fatigue and weight loss (up 22 pounds since last visit).       Feels "ok".  Back pain effects ADLs.  HENT: Negative.  Negative for congestion, ear pain, hearing loss, nosebleeds, sinus  pain and sore throat.        Needs 4 teeth extracted.  Eyes: Negative.  Negative for blurred vision, double vision and photophobia.  Respiratory: Positive for shortness of breath (exertional). Negative for cough, hemoptysis and sputum production.   Cardiovascular: Negative.  Negative for chest pain, palpitations and leg swelling.  Gastrointestinal: Positive for abdominal pain (intermittent like labor pains), nausea and vomiting. Negative for blood in stool, constipation, diarrhea, heartburn (on PPI) and melena.       Colostomy s/p APR.  Genitourinary: Negative for dysuria, frequency, hematuria and urgency.  Musculoskeletal: Positive for back pain (pain increases with movement). Negative for falls, joint pain, myalgias and neck pain.       Difficulty laying down due to pain.  Skin: Negative.  Negative for itching and rash.  Neurological: Negative.  Negative for dizziness, tingling, sensory change, speech change, focal weakness, weakness and headaches.  Endo/Heme/Allergies: Negative.  Does  not bruise/bleed easily.  Psychiatric/Behavioral: Negative for depression and memory loss. The patient is not nervous/anxious and does not have insomnia.   All other systems reviewed and are negative.  Performance status (ECOG): 1-2  Vitals Blood pressure (!) 142/72, pulse 98, temperature 99.1 F (37.3 C), temperature source Oral, resp. rate 20, weight 198 lb 1.3 oz (89.8 kg), SpO2 100 %.   Physical Exam  Constitutional: She is oriented to person, place, and time. She appears well-developed and well-nourished. No distress.  Patient in pain getting on and off exam table.  HENT:  Head: Normocephalic and atraumatic.  Mouth/Throat: Oropharynx is clear and moist. No oropharyngeal exudate.  Blonde/graying hair.  Dentures.  Mask.  Eyes: Pupils are equal, round, and reactive to light. Conjunctivae and EOM are normal. No scleral icterus.  Blue eyes.  Neck: No JVD present.  Cardiovascular: Normal rate, regular rhythm and normal heart sounds.  No murmur heard. Pulmonary/Chest: Effort normal and breath sounds normal. No respiratory distress. She has no wheezes. She has no rales. She exhibits no tenderness.  Abdominal: Soft. Bowel sounds are normal. She exhibits no distension and no mass. There is no abdominal tenderness (left side of abdomen). There is no rebound and no guarding.  Left lower quadrant ostomy.  Musculoskeletal:        General: No edema. Normal range of motion.     Cervical back: Normal range of motion and neck supple.     Lumbar back: Tenderness (left SI joint) present.  Lymphadenopathy:       Head (right side): No preauricular, no posterior auricular and no occipital adenopathy present.       Head (left side): No preauricular, no posterior auricular and no occipital adenopathy present.    She has no cervical adenopathy.    She has no axillary adenopathy.       Right: No inguinal and no supraclavicular adenopathy present.       Left: No inguinal and no supraclavicular adenopathy  present.  Neurological: She is alert and oriented to person, place, and time.  Skin: Skin is warm and dry. No rash noted. She is not diaphoretic. No erythema. No pallor.  Psychiatric: She has a normal mood and affect. Her behavior is normal. Judgment and thought content normal.  Tearful at times.  Nursing note and vitals reviewed.   Imaging studies: 01/13/2017:  Abdomen and pelvic CTrevealed irregular thickening of the rectum suspicious for carcinoma. There was a 1.6 cm hypoattenuating lesion in the right hepatic lobe worrisome for metastatic disease.   01/15/2017:  Chest CT  revealed a 9 mm noncalcified left lower lobe pulmonary nodule (new). 01/16/2017:  Pelvic MRI revealed rectal adenocarcinoma T3N1.  There was extension beyond the muscularis propria (17 mm).  There were mesorectal nodes >= 5 mm.  There was adenopathy on the left side of the mesorectum, including a 9 mm lesion.  The distance from the tumor to the anal sphincter was 8 mm. 01/27/2017:  PET scanrevealed intense metabolic activity associated rectal mass consists with primary rectal carcinoma. There was a hypermetabolic metastatic lesion to the central LEFT hepatic lobe. There was metabolic activity associated with small LEFT lower lobe nodule which was most consistent with pulmonary metastasis. 01/31/2017:  Liver MRI revealed a 2.6 x 2.7 x 2.1 cm lesion in segment VIII of the liver near the junction with segment IVa c/w a metastatic lesion.  There was a 1.1 x 0.8 cm suspected metastatic lesion peripherally in segment VI of the liver.   05/16/2017:  Abdomen and pelvic CT revealed persistent but decreased asymmetric wall thickening in the rectum.  The medial segment left liver lesion was smaller (2.7 x 2.6 cm to 1.9 x 1.6 cm). A tiny lesion seen in segment VI of the liver on previous MRI was not evident on today's CT scan.  The left lower lobe pulmonary nodule was smaller (9 mm to 6 mm).  There was no new or progressive findings in  the abdomen and pelvis. 08/19/2017:  Chest CT with contrast on 08/19/2017 revealed left lower lobe 6 mm solid pulmonary nodule, decreased since 01/15/2017 chest CT. Stable 7 mm right lower lobe pulmonary nodule.  There was no new or progressive metastatic disease in the chest.   09/03/2018:  Pelvic MRI at [facility] demonstrated a partial response of the primary tumor and extramural disease. Post treatment category ymrT T3b. There was an overall decrease in size in the previously seen perirectal node. Hypointense changes at the site of prior tumor focally contacedt buts does not definitely invade the LEFT levator ani. Tumor contacted, but does not definitely invade the internal anal sphincter. No evidence of residual or recurrent hepatic disease following microwave ablation.  02/17/2018:  Abdomen and pelvic CT revealed evolution of the ablation zone in segment 4A, now measuring 3.7 x 3.1 cm, decreased. There was no findings to suggest viable tumor.  She is s/p abdominoperineal resection with left lower quadrant colostomy.  There was no evidence of new/progressive metastatic disease.  She is s/p left lower lobe pulmonary wedge resection, incompletely visualized.  03/04/2018:  Chest CT revealed a stable chest CT (compared to 08/19/2017). The index right lower lobe lung nodule measured 7 mm (unchanged). There was no new or progressive nodularity identified. There was continued decrease in size of ablation defect within segment 4a of the liver.  09/09/2019:  Chest CT at [facility] revealed no intrathoracic metastasis and no new or enlarging pulmonary nodules. 09/09/2019:  Abdomen and pelvis CT at [facility] revealed multiple subcentimeter hypodensities in the dome of the liver, which appeared new/more conspicuous from prior CT. There were not seen on the prior MRI. Further evaluation with MRI was recommended. CT evaluation was limited due to background fatty liver and the heterogeneity/these lesions could have been secondary to fatty  liver. There was unchanged soft tissue stranding and nodularity in the pelvis, with a few prominent more nodular areas. There were a few more prominent indeterminate areas and short-term follow up was recommended to ensure stability and exclude peritoneal disease.  Further evaluation with MRI could have been helpful.  No visits with results within 3 Day(s) from this visit.  Latest known visit with results is:  Admission on 08/12/2019, Discharged on 08/15/2019  Component Date Value Ref Range Status  . WBC 08/12/2019 12.4* 4.0 - 10.5 K/uL Final  . RBC 08/12/2019 5.12* 3.87 - 5.11 MIL/uL Final  . Hemoglobin 08/12/2019 16.2* 12.0 - 15.0 g/dL Final  . HCT 08/12/2019 46.9* 36.0 - 46.0 % Final  . MCV 08/12/2019 91.6  80.0 - 100.0 fL Final  . MCH 08/12/2019 31.6  26.0 - 34.0 pg Final  . MCHC 08/12/2019 34.5  30.0 - 36.0 g/dL Final  . RDW 08/12/2019 12.0  11.5 - 15.5 % Final  . Platelets 08/12/2019 349  150 - 400 K/uL Final  . nRBC 08/12/2019 0.0  0.0 - 0.2 % Final  . Neutrophils Relative % 08/12/2019 86  % Final  . Neutro Abs 08/12/2019 10.6* 1.7 - 7.7 K/uL Final  . Lymphocytes Relative 08/12/2019 9  % Final  . Lymphs Abs 08/12/2019 1.1  0.7 - 4.0 K/uL Final  . Monocytes Relative 08/12/2019 4  % Final  . Monocytes Absolute 08/12/2019 0.5  0.1 - 1.0 K/uL Final  . Eosinophils Relative 08/12/2019 0  % Final  . Eosinophils Absolute 08/12/2019 0.0  0.0 - 0.5 K/uL Final  . Basophils Relative 08/12/2019 0  % Final  . Basophils Absolute 08/12/2019 0.0  0.0 - 0.1 K/uL Final  . Immature Granulocytes 08/12/2019 1  % Final  . Abs Immature Granulocytes 08/12/2019 0.08* 0.00 - 0.07 K/uL Final   Performed at [facility], [address] [place], [place] [address]  . Sodium 08/12/2019 141  135 - 145 mmol/L Final  . Potassium 08/12/2019 3.2* 3.5 - 5.1 mmol/L Final  . Chloride 08/12/2019 95* 98 - 111 mmol/L Final  . CO2 08/12/2019 30  22 - 32 mmol/L Final  . Glucose, Bld 08/12/2019 159* 70 - 99  mg/dL Final  . BUN 08/12/2019 14  8 - 23 mg/dL Final  . Creatinine, Ser 08/12/2019 1.06* 0.44 - 1.00 mg/dL Final  . Calcium 08/12/2019 10.0  8.9 - 10.3 mg/dL Final  . Total Protein 08/12/2019 7.5  6.5 - 8.1 g/dL Final  . Albumin 08/12/2019 4.2  3.5 - 5.0 g/dL Final  . AST 08/12/2019 28  15 - 41 U/L Final  . ALT 08/12/2019 19  0 - 44 U/L Final  . Alkaline Phosphatase 08/12/2019 107  38 - 126 U/L Final  . Total Bilirubin 08/12/2019 0.6  0.3 - 1.2 mg/dL Final  . GFR calc non Af Amer 08/12/2019 56* >60 mL/min Final  . GFR calc Af Amer 08/12/2019 >60  >60 mL/min Final  . Anion gap 08/12/2019 16* 5 - 15 Final   Performed at [facility], [address] [place], [place] [address]  . Lipase 08/12/2019 28  11 - 51 U/L Final   Performed at [facility], [address] [place], [place] [address]  . Lactic Acid, Venous 08/12/2019 3.6* 0.5 - 1.9 mmol/L Final   Comment: CRITICAL RESULT CALLED TO, READ BACK BY AND VERIFIED WITH BUTCH WOODS AT 2306 08/12/2019  TFK Performed at [facility], [address], [place], [place] [address]   . Lactic Acid, Venous 08/13/2019 2.5* 0.5 - 1.9 mmol/L Final   Comment: CRITICAL VALUE NOTED. VALUE IS CONSISTENT WITH PREVIOUSLY REPORTED/CALLED VALUE [facility] Performed at [facility], [address] [place], [place] [address]   . Color, Urine 08/13/2019 STRAW* YELLOW Final  .  APPearance 08/13/2019 CLEAR* CLEAR Final  . Specific Gravity, Urine 08/13/2019 >1.046* 1.005 - 1.030 Final  . pH 08/13/2019 6.0  5.0 - 8.0 Final  . Glucose, UA 08/13/2019 NEGATIVE  NEGATIVE mg/dL Final  . Hgb urine dipstick 08/13/2019 SMALL* NEGATIVE Final  . Bilirubin Urine 08/13/2019 NEGATIVE  NEGATIVE Final  . Ketones, ur 08/13/2019 NEGATIVE  NEGATIVE mg/dL Final  . Protein, ur 08/13/2019 NEGATIVE  NEGATIVE mg/dL Final  . Nitrite 08/13/2019 NEGATIVE  NEGATIVE Final  . Leukocytes,Ua 08/13/2019 NEGATIVE  NEGATIVE Final  . RBC / HPF 08/13/2019 0-5  0 -  5 RBC/hpf Final  . WBC, UA 08/13/2019 0-5  0 - 5 WBC/hpf Final  . Bacteria, UA 08/13/2019 NONE SEEN  NONE SEEN Final  . Squamous Epithelial / LPF 08/13/2019 0-5  0 - 5 Final  . Mucus 08/13/2019 PRESENT   Final   Performed at [facility], [address], [place], [place] [address]  . Specimen Description 08/13/2019 BLOOD LEFT ASSIST CONTROL   Final  . Special Requests 08/13/2019 BOTTLES DRAWN AEROBIC AND ANAEROBIC Blood Culture results may not be optimal due to an excessive volume of blood received in culture bottles   Final  . Culture 08/13/2019    Final                   Value:NO GROWTH 5 DAYS Performed at [facility], [address] [place], [place] [address]   . Report Status 08/13/2019 08/18/2019 FINAL   Final  . Specimen Description 08/13/2019 BLOOD RIGHT HAND   Final  . Special Requests 08/13/2019 BOTTLES DRAWN AEROBIC AND ANAEROBIC Blood Culture adequate volume   Final  . Culture 08/13/2019    Final                   Value:NO GROWTH 5 DAYS Performed at [facility], [address] [place], [place] [address]   . Report Status 08/13/2019 08/18/2019 FINAL   Final  . SARS Coronavirus 2 08/13/2019 NEGATIVE  NEGATIVE Final   Comment: (NOTE) SARS-CoV-2 target nucleic acids are NOT DETECTED. The SARS-CoV-2 RNA is generally detectable in upper and lower respiratory specimens during the acute phase of infection. Negative results do not preclude SARS-CoV-2 infection, do not rule out co-infections with other pathogens, and should not be used as the sole basis for treatment or other patient management decisions. Negative results must be combined with clinical observations, patient history, and epidemiological information. The expected result is Negative. Fact Sheet for Patients: SugarRoll.be Fact Sheet for Healthcare Providers: https://www.woods-mathews.com/ This test is not yet approved or cleared by the [place] FDA and  has been authorized for detection and/or diagnosis of SARS-CoV-2 by FDA under an Emergency Use Authorization (EUA). This EUA will remain  in effect (meaning this test can be used) for the duration of the COVID-19 declaration under Section 56                          4(b)(1) of the Act, 21 U.S.C. section 360bbb-3(b)(1), unless the authorization is terminated or revoked sooner. Performed at [facility], [place] [address] [place], [place] [address]   . HIV Screen 4th Generation wRfx 08/14/2019 NON REACTIVE  NON REACTIVE Final   Performed at [facility], [place] [address] [place], [place] [address]  . Sodium 08/14/2019 143  135 - 145 mmol/L Final  . Potassium 08/14/2019 3.5  3.5 - 5.1 mmol/L Final  .  Chloride 08/14/2019 103  98 - 111 mmol/L Final  . CO2 08/14/2019 30  22 - 32 mmol/L Final  . Glucose, Bld 08/14/2019 106* 70 - 99 mg/dL Final  . BUN 08/14/2019 8  8 - 23 mg/dL Final  . Creatinine, Ser 08/14/2019 0.64  0.44 - 1.00 mg/dL Final  . Calcium 08/14/2019 8.8* 8.9 - 10.3 mg/dL Final  . Total Protein 08/14/2019 6.2* 6.5 - 8.1 g/dL Final  . Albumin 08/14/2019 3.5  3.5 - 5.0 g/dL Final  . AST 08/14/2019 18  15 - 41 U/L Final  . ALT 08/14/2019 16  0 - 44 U/L Final  . Alkaline Phosphatase 08/14/2019 79  38 - 126 U/L Final  . Total Bilirubin 08/14/2019 0.8  0.3 - 1.2 mg/dL Final  . GFR calc non Af Amer 08/14/2019 >60  >60 mL/min Final  . GFR calc Af Amer 08/14/2019 >60  >60 mL/min Final  . Anion gap 08/14/2019 10  5 - 15 Final   Performed at [facility], [address], [place], [place] [address]  . WBC 08/14/2019 4.4  4.0 - 10.5 K/uL Final  . RBC 08/14/2019 3.91  3.87 - 5.11 MIL/uL Final  . Hemoglobin 08/14/2019 12.4  12.0 - 15.0 g/dL Final  . HCT 08/14/2019 36.9  36.0 - 46.0 % Final  . MCV 08/14/2019 94.4  80.0 - 100.0 fL Final  . MCH 08/14/2019 31.7  26.0 - 34.0 pg Final  . MCHC 08/14/2019 33.6  30.0 - 36.0 g/dL Final  . RDW 08/14/2019 12.3   11.5 - 15.5 % Final  . Platelets 08/14/2019 247  150 - 400 K/uL Final  . nRBC 08/14/2019 0.0  0.0 - 0.2 % Final   Performed at [facility], [address] [place], [place] [address]  . Magnesium 08/14/2019 2.3  1.7 - 2.4 mg/dL Final   Performed at [facility], [address] [place], [place] [address]  . Glucose-Capillary 08/14/2019 103* 70 - 99 mg/dL Final  . Comment 1 08/14/2019 Notify RN   Final  . Glucose-Capillary 08/14/2019 116* 70 - 99 mg/dL Final  . Comment 1 08/14/2019 Notify RN   Final    Assessment:  Tamara Hall is a 64 y.o. female with clinical stage T3N1M1 rectal cancer. She presented with a 9 month history of rectal bleeding.  Colonoscopyon 01/14/2017 revealed afrond-like/villous non-obstructing large mass in the rectum. The mass was non-circumferential. Biopsies revealed high grade dysplasia within an adenoma (? sampling error).  CEA was 31.6 on 01/15/2017.  PET scanon 01/27/2017 revealed intense metabolic activity associated rectal mass consists with primary rectal carcinoma. There was a hypermetabolic metastatic lesion to the central LEFT hepatic lobe. There was metabolic activity associated with small LEFT lower lobe nodule which was most consistent with pulmonary metastasis.  CT [facility] liver [facility]at [facility] on 02/07/2017 revealed malignant cells c/w metastatic colorectal adenocarcinoma. Tumor was sent for  MMR/MSI, KRAS/NRAS, and BRAF.  She is scheduled to have liver directed therapy(ablation) at [facility] on 03/19/2017.  CEA has been followed: 31.6 on 01/15/2017, 38.1 on 02/18/2017, 25.1 on 03/18/2017, 11.8 on 04/08/2017, 9.2 on 04/22/2017, 9.5 on 05/20/2017, 9.6 on 06/19/2017, 3.7 on 02/18/2018, 5.8 on 06/26/2018, 10.5 on 11/20/2018, 19.5 on 03/26/2019, 34.0 on 07/21/2019, 46.4 on 09/09/2019.  She received 2 cycles of FOLFOX  (02/18/2017 - 03/04/2017).  She developed transient cold induced neuropathy.  She developed wheezing and hypoxia with  cycle #2 felt secondary to oxaliplatin.  She received 4 cycles of FOLFIRI (03/24/2017 -  04/22/2017) with Neulasta support.  Diarrhea was controlled with Imodium.  She underwent CT guided microwave ablation of the liver lesion on 06/06/2017.    She began radiation on 06/18/2017.  She receives daily Xeloda with radiation. Radiation and Xeloda completed on 08/15/2017.  She underwent APR with descending colostomy and VRAM flap on 10/24/2017. She underwent a VATS wedge resection to her LEFT lung. 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 Final diagnosis was [id number] rectal adenocarcinoma with metastasis to left lower lobe of the lung.  She has had post-operative pain and ostomy ischemia.  She underwent surgical ostomy revision on 11/28/2017.  Pathology showed focal mucosal erosion and fat necrosis c/w ischemia. There was extensive underlying fat necrosis. No dysplasia or carcinoma was identified. She was treated with oral Augmentin and wound packing.  She has been treated with oxycodone 5 mg po q 4 hrs prn, gabapentin 900 mg TID.  She was treated with Bactrim on 01/18/2018.  She was referred to local pain management clinic on 01/27/2018.   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  There was hepatic steatosis and post-treatment changes in the pelvis with persistent presacral soft tissue.  There was a 3.5 cm infrarenal abdominal aortic aneurysm.  Chest CT at [facility] on 09/09/2019 revealed no intrathoracic metastasis and no new or enlarging pulmonary nodules.  Abdomen and pelvis CT at [facility] on 09/09/2019 revealed multiple subcentimeter hypodensities in the dome of the liver, which appeared new/more conspicuous from prior CT. There were not seen on the prior MRI. Further evaluation with MRI was  recommended. CT evaluation was limited due to background fatty liver and the heterogeneity/these lesions could have been secondary to fatty liver. There was unchanged soft tissue stranding and nodularity in the pelvis, with a few prominent more nodular areas. There were a few more prominent indeterminate areas and short-term follow up was recommended to ensure stability and exclude peritoneal disease.  Further evaluation with MRI could have been helpful.   Colonoscopy at [facility] on 09/13/2019 did not appear malignant with no adenomatous (granulation tissue). There was diverticulosis in the descending colon, normal mucosa in the entire examined colon and the examined portion of the ileum. There was a 9 mm lipoma in the transverse colon and a 4 mm polyp at the surgical stoma.   She has a normocytic anemia.  She has an 80 pack year smoking history.  She is smoking.  She was admitted to [facility] from 08/12/2019 to 08/15/2019 for a small bowel obstruction.  She had severe abdominal pain with associated nausea and vomiting.  Abdominal and pelvic CT revealed multiple small bowel loops which were dilated.  Obstruction resolved spontaneously.   Symptomatically, She has intermittent severe abdominal pain.  She has right posterior iliac pain.  Scans at [facility] suggest possible liver lesions.  CEA is rising.  Plan: 1.   Labs today: CBC with diff, CMP, CEA. 2.   Stage IV rectal carcinoma  Review extensive notes and images from [facility] as well as intervening admission at [facility].  Discuss last abdomen and pelvis CT scan.   Tiny liver lesions of unclear significance.   Discus need for liver MRI to further evaluate.  Discuss concern for rising CEA.   CEA has increased from 3.7 on 02/18/2018 to 46.4 on 09/09/2019.  Discuss concern for ongoing pain.   Unclear severe  pain in left SI joint area.   Discuss pelvic MRI.  If imaging unrevealing and CEA markedly elevated, discuss follow-up PET scan. 3.   Recurrent abdominal  pain  Patient gives a good history for recurrent partial bowel obstruction.  Colonoscopy revealed no lesions.  Possible adhesions causing issues.  Await imaging studies. 4.  Left iliac pain  Etiology unclear.  Pelvic MRI ordered.  Patient has been to the pain clinic. 5.   Dental issues  Patient requires 4 extractions.  Note to dentist today.  As patient has a port-a-cath in place, SBE prophylaxis. 6.  History of low B12  B12 was 122 (low) on 02/18/2018.  Folate was 12.9 (normal) on 02/18/2018.  Patient previously on B12 injections.  Anticipate repeat labs at next blood draw.  7.   Nausea  Rx:  Ondansetron 8 mg po q 8 hours prn nausea. 8.   Schedule abdominal/liver and pelvis MRI on 11/01/2019. 9.   RTC after imaging for MD assessment, review of work-up and discussion regarding direction of therapy.  I discussed the assessment and treatment plan with the patient.  The patient was provided an opportunity to ask questions and all were answered.  The patient agreed with the plan and demonstrated an understanding of the instructions.  The patient was advised to call back if the symptoms worsen or if the condition fails to improve as anticipated.  I provided 22 minutes of face-to-face time during this this encounter and > 50% was spent counseling as documented under my assessment and plan.  An additional 35 minutes were spent reviewing records from [facility] and her last admission to [facility].  Imaging reports and labs were reviewed.   Lequita Asal, MD, PhD    10/28/2019, 1:56 PM  I, Selena Batten, am acting as scribe for [employer]. Mike Gip, MD, PhD.  I, Khady Vandenberg C. Mike Gip, MD, have reviewed the above documentation for accuracy and completeness, and I agree with the above.

## 2019-10-27 ENCOUNTER — Other Ambulatory Visit: Payer: Self-pay

## 2019-10-27 ENCOUNTER — Encounter: Payer: Self-pay | Admitting: Hematology and Oncology

## 2019-10-27 NOTE — Progress Notes (Signed)
No new changes noted today. The patient name and DOB has been verified by phone today. 

## 2019-10-28 ENCOUNTER — Other Ambulatory Visit: Payer: Self-pay

## 2019-10-28 ENCOUNTER — Inpatient Hospital Stay: Payer: Medicare HMO

## 2019-10-28 ENCOUNTER — Inpatient Hospital Stay: Payer: Medicare HMO | Attending: Hematology and Oncology | Admitting: Hematology and Oncology

## 2019-10-28 VITALS — BP 142/72 | HR 98 | Temp 99.1°F | Resp 20 | Wt 198.1 lb

## 2019-10-28 DIAGNOSIS — C7802 Secondary malignant neoplasm of left lung: Secondary | ICD-10-CM

## 2019-10-28 DIAGNOSIS — M545 Low back pain: Secondary | ICD-10-CM | POA: Insufficient documentation

## 2019-10-28 DIAGNOSIS — R110 Nausea: Secondary | ICD-10-CM | POA: Insufficient documentation

## 2019-10-28 DIAGNOSIS — C787 Secondary malignant neoplasm of liver and intrahepatic bile duct: Secondary | ICD-10-CM

## 2019-10-28 DIAGNOSIS — Z8719 Personal history of other diseases of the digestive system: Secondary | ICD-10-CM

## 2019-10-28 DIAGNOSIS — F17210 Nicotine dependence, cigarettes, uncomplicated: Secondary | ICD-10-CM | POA: Diagnosis not present

## 2019-10-28 DIAGNOSIS — K769 Liver disease, unspecified: Secondary | ICD-10-CM | POA: Insufficient documentation

## 2019-10-28 DIAGNOSIS — R102 Pelvic and perineal pain: Secondary | ICD-10-CM | POA: Diagnosis not present

## 2019-10-28 DIAGNOSIS — E538 Deficiency of other specified B group vitamins: Secondary | ICD-10-CM | POA: Insufficient documentation

## 2019-10-28 DIAGNOSIS — Z7189 Other specified counseling: Secondary | ICD-10-CM

## 2019-10-28 DIAGNOSIS — R109 Unspecified abdominal pain: Secondary | ICD-10-CM | POA: Diagnosis not present

## 2019-10-28 DIAGNOSIS — C20 Malignant neoplasm of rectum: Secondary | ICD-10-CM | POA: Diagnosis present

## 2019-10-28 DIAGNOSIS — R112 Nausea with vomiting, unspecified: Secondary | ICD-10-CM

## 2019-10-28 LAB — CBC WITH DIFFERENTIAL/PLATELET
Abs Immature Granulocytes: 0.08 10*3/uL — ABNORMAL HIGH (ref 0.00–0.07)
Basophils Absolute: 0 10*3/uL (ref 0.0–0.1)
Basophils Relative: 0 %
Eosinophils Absolute: 0.1 10*3/uL (ref 0.0–0.5)
Eosinophils Relative: 1 %
HCT: 39.2 % (ref 36.0–46.0)
Hemoglobin: 13.5 g/dL (ref 12.0–15.0)
Immature Granulocytes: 1 %
Lymphocytes Relative: 17 %
Lymphs Abs: 1.5 10*3/uL (ref 0.7–4.0)
MCH: 31.3 pg (ref 26.0–34.0)
MCHC: 34.4 g/dL (ref 30.0–36.0)
MCV: 91 fL (ref 80.0–100.0)
Monocytes Absolute: 0.6 10*3/uL (ref 0.1–1.0)
Monocytes Relative: 7 %
Neutro Abs: 6.3 10*3/uL (ref 1.7–7.7)
Neutrophils Relative %: 74 %
Platelets: 303 10*3/uL (ref 150–400)
RBC: 4.31 MIL/uL (ref 3.87–5.11)
RDW: 12.4 % (ref 11.5–15.5)
WBC: 8.5 10*3/uL (ref 4.0–10.5)
nRBC: 0 % (ref 0.0–0.2)

## 2019-10-28 LAB — COMPREHENSIVE METABOLIC PANEL
ALT: 18 U/L (ref 0–44)
AST: 21 U/L (ref 15–41)
Albumin: 4.1 g/dL (ref 3.5–5.0)
Alkaline Phosphatase: 100 U/L (ref 38–126)
Anion gap: 13 (ref 5–15)
BUN: 10 mg/dL (ref 8–23)
CO2: 26 mmol/L (ref 22–32)
Calcium: 9 mg/dL (ref 8.9–10.3)
Chloride: 96 mmol/L — ABNORMAL LOW (ref 98–111)
Creatinine, Ser: 0.71 mg/dL (ref 0.44–1.00)
GFR calc Af Amer: >60 mL/min (ref >60)
GFR calc non Af Amer: >60 mL/min (ref >60)
Glucose, Bld: 125 mg/dL — ABNORMAL HIGH (ref 70–99)
Potassium: 3 mmol/L — ABNORMAL LOW (ref 3.5–5.1)
Sodium: 135 mmol/L (ref 135–145)
Total Bilirubin: 0.3 mg/dL (ref 0.3–1.2)
Total Protein: 7.3 g/dL (ref 6.5–8.1)

## 2019-10-28 MED ORDER — HEPARIN SOD (PORK) LOCK FLUSH 100 UNIT/ML IV SOLN
500.0000 [IU] | Freq: Once | INTRAVENOUS | Status: AC
  Administered 2019-10-28: 500 [IU] via INTRAVENOUS
  Filled 2019-10-28: qty 5

## 2019-10-28 MED ORDER — ONDANSETRON 8 MG PO TBDP: 8.0000 mg | ORAL_TABLET | Freq: Three times a day (TID) | ORAL | 1 refills | Status: DC | PRN

## 2019-10-28 MED ORDER — SODIUM CHLORIDE 0.9% FLUSH
10.0000 mL | INTRAVENOUS | Status: DC | PRN
  Administered 2019-10-28: 10 mL via INTRAVENOUS
  Filled 2019-10-28: qty 10

## 2019-10-29 ENCOUNTER — Other Ambulatory Visit: Payer: Self-pay | Admitting: Hematology and Oncology

## 2019-10-29 DIAGNOSIS — Z8719 Personal history of other diseases of the digestive system: Secondary | ICD-10-CM | POA: Insufficient documentation

## 2019-10-29 LAB — CEA: CEA: 54.4 ng/mL — ABNORMAL HIGH (ref 0.0–4.7)

## 2019-10-29 MED ORDER — POTASSIUM CHLORIDE CRYS ER 10 MEQ PO TBCR: 10.0000 meq | EXTENDED_RELEASE_TABLET | Freq: Two times a day (BID) | ORAL | 0 refills | Status: DC

## 2019-11-03 ENCOUNTER — Telehealth: Payer: Self-pay | Admitting: *Deleted

## 2019-11-03 NOTE — Telephone Encounter (Signed)
RN called pt and informed her that Dr Mike Gip wanted her to increased her intake of potassium rich foods.  RN reviewed foods that are iron rich and pt verbalized understand and said she had been trying to increase her intake.

## 2019-11-05 ENCOUNTER — Other Ambulatory Visit: Payer: Self-pay | Admitting: Hematology and Oncology

## 2019-11-10 ENCOUNTER — Telehealth: Payer: Self-pay | Admitting: *Deleted

## 2019-11-10 ENCOUNTER — Other Ambulatory Visit: Payer: Self-pay

## 2019-11-10 NOTE — Telephone Encounter (Signed)
  Please call patient.  M 

## 2019-11-10 NOTE — Telephone Encounter (Signed)
Informed patient Dr. Mike Gip will be calling in Ativan 0.5 MG for her MRI. Informed her she is to take 1 tablet prior to the scan and will need a driver after taking the medication. Patient verbalizes understanding and denies any further questions or concerns.

## 2019-11-10 NOTE — Telephone Encounter (Signed)
Patient called stating that she was told that she would be on the CT Scan table for an hour and that she has back pain and feels she will need something to relax her. She states she has pain medicine, but feels she needs something more to relax her so that she can complete the scan. Please advise and if agreeable send prescription to [place]

## 2019-11-11 ENCOUNTER — Telehealth: Payer: Self-pay

## 2019-11-11 MED ORDER — LORAZEPAM 0.5 MG PO TABS: ORAL_TABLET | ORAL | 0 refills | Status: DC

## 2019-11-11 NOTE — Telephone Encounter (Signed)
Informed the patient that she must need a driver for her scan tomorrow. due to the Ativan The patient was understanding and agreeable

## 2019-11-12 ENCOUNTER — Other Ambulatory Visit: Payer: Self-pay

## 2019-11-12 ENCOUNTER — Ambulatory Visit
Admission: RE | Admit: 2019-11-12 | Discharge: 2019-11-12 | Disposition: A | Discharge status: Home / Self Care | Payer: Medicare HMO | Source: Ambulatory Visit | Attending: Hematology and Oncology | Admitting: Hematology and Oncology

## 2019-11-12 ENCOUNTER — Telehealth: Payer: Self-pay

## 2019-11-12 DIAGNOSIS — C20 Malignant neoplasm of rectum: Secondary | ICD-10-CM

## 2019-11-12 DIAGNOSIS — C787 Secondary malignant neoplasm of liver and intrahepatic bile duct: Secondary | ICD-10-CM | POA: Insufficient documentation

## 2019-11-12 MED ORDER — GADOBUTROL 1 MMOL/ML IV SOLN
7.5000 mL | Freq: Once | INTRAVENOUS | Status: AC | PRN
  Administered 2019-11-12: 17:00:00 7.5 mL via INTRAVENOUS

## 2019-11-12 NOTE — Telephone Encounter (Signed)
Spoke with ms Kleine to make sure she had a driver for her scan this afternoon and the patient states yesy she does her sister will be taking her. I informed the patient on 11/11/2019. just wanted to follow up with the patient. The patient was understanding ana agreeable.

## 2019-11-14 NOTE — Progress Notes (Signed)
[facility]  [address], [address] [place], [place] [address] Phone: [phone]  Fax: [phone]   Clinic Day:  11/15/2019  Referring physician: Georga Kaufmann, NP  Chief Complaint: Tamara Hall is a 64 y.o. 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     HPI: The patient was last seen in the medical oncology clinic on 10/28/2019. At that time, she had intermittent severe abdominal pain.  She had right posterior iliac pain. CBC was normal. Potassium was 3.0.  CEA was 54.4.  CT scans at [facility] suggested for possible liver lesions.    Liver and pelvis MRI on 11/12/2019 showed no findings to explain the patient's history of rising CEA. There was irregular presacral soft tissue with apparent scarring noted in the left paracentral pelvis was similar to prior studies and presumably treatment related. PET-CT could be used, as clinically warranted to exclude hypermetabolic disease within that region. There was an ablation defect identified in segment IV with minimal irregular enhancement along the anterosuperior margin, indeterminate. Attention on follow-up recommended. There was a tiny cyst identified in the dome of the right liver without other findings to suggest metastatic disease in the hepatic parenchyma.  During the interim, she felt "fair to mid[place]". She notes chronic back pain secondary to surgery. She relies heavily on her walker. She is unable to stand more than 60 seconds without her walker. She notes insomnia because the pain increases when she laying down.   She denies any nausea, vomiting or abdominal pain.  Her shortness of breath is better. She is taking oral potassium twice a day. Her bowels are normal. She notes going to see the dentist on 11/30/2019 for an evaluation and a possible crown.    Past  Medical History:  Diagnosis Date  . Cancer ([place])    colon  . Hypertension   . Pneumonia    20+ years ago    Past Surgical History:  Procedure Laterality Date  . ABDOMINAL HYSTERECTOMY    . BREAST SURGERY    . COLONOSCOPY WITH PROPOFOL N/A 01/14/2017   Procedure: COLONOSCOPY WITH PROPOFOL;  Surgeon: Lucilla Lame, MD;  Location: [facility] ENDOSCOPY;  Service: Endoscopy;  Laterality: N/A;  . ELBOW SURGERY Right    Has had 4 surgeries on right elbow.  Marland Kitchen HAND SURGERY Right 2008  . JOINT REPLACEMENT    . PORTA CATH INSERTION N/A 01/29/2017   Procedure: Glori Luis Cath Insertion;  Surgeon: Algernon Huxley, MD;  Location: [place] [facility];  Service: Cardiovascular;  Laterality: N/A;  . TONSILLECTOMY      Family History  Problem Relation Age of Onset  . Lung cancer Mother   . Lung cancer Father   . Brain cancer Maternal Grandmother     Social History:  reports that she has been smoking cigarettes. She has a 80.00 pack-year smoking history. She has never used smokeless tobacco. She reports that she does not drink alcohol or use drugs. She was smoking 1/2 packs/day. She stopped smoking 10/24/2017. She lives in [place]. She has 3 children (ages 37, 19, and 67). She smoked 2 packs a day. She started smoking at age 38. Stopped smoking on 10/24/2017. 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 [name] is her significant other.  The patient is alone  today.  Allergies: No Known Allergies  Current Medications: Current Outpatient Medications  Medication Sig Dispense Refill  . amLODipine (NORVASC) 5 MG tablet Take 5 mg by mouth daily.    . baclofen (LIORESAL) 10 MG tablet Take 10 mg by mouth 3 (three) times daily.    Marland Kitchen gabapentin (NEURONTIN) 300 MG capsule TAKE 1 CAPSULE BY MOUTH THREE TIMES A DAY 90 capsule 0  . gabapentin (NEURONTIN) 300 MG capsule Take by mouth.    . hydrochlorothiazide (HYDRODIURIL) 25 MG tablet Take 25 mg by mouth daily.    Marland Kitchen  HYDROcodone-acetaminophen (NORCO/VICODIN) 5-325 MG tablet Take 1 tablet by mouth every 6 (six) hours as needed for moderate pain. 15 tablet 0  . KLOR-CON M10 10 MEQ tablet TAKE 1 TABLET (10 MEQ TOTAL) BY MOUTH 2 (TWO) TIMES DAILY. 30 tablet 0  . LORazepam (ATIVAN) 0.5 MG tablet Take one tablet (0.5 mg) by mouth 30 minutes prior to the procedure.  Patient must have a driver. 2 tablet 0  . nortriptyline (PAMELOR) 10 MG capsule Take 10 mg by mouth at bedtime.    . ondansetron (ZOFRAN ODT) 8 MG disintegrating tablet Take 1 tablet (8 mg total) by mouth every 8 (eight) hours as needed for nausea or vomiting. 20 tablet 1   No current facility-administered medications for this visit.    Review of Systems  Constitutional: Positive for weight loss (1 pound). Negative for chills, diaphoresis, fever and malaise/fatigue.       Feels "fair to mid[place]".  Back pain effects ADLs. Relies heavily on rolling walker.  HENT: Negative.  Negative for congestion, ear pain, hearing loss, nosebleeds, sinus pain and sore throat.        Plans to have dental work on 4 teeth (fillings and crowns).  Eyes: Negative.  Negative for blurred vision, double vision and photophobia.  Respiratory: Negative.  Negative for cough, hemoptysis, sputum production and shortness of breath (exertional- improved).   Cardiovascular: Negative.  Negative for chest pain, palpitations and leg swelling.  Gastrointestinal: Negative for abdominal pain (intermittent labor-like pains), blood in stool, constipation, diarrhea, heartburn (on PPI), melena, nausea and vomiting.       Colostomy s/p APR.  Normal bowels.  Genitourinary: Negative.  Negative for dysuria, frequency, hematuria and urgency.  Musculoskeletal: Positive for back pain (pain increases with movement and when laying down). Negative for falls, joint pain, myalgias and neck pain.       Difficulty laying down due to pain.  Skin: Negative.  Negative for itching and rash.  Neurological:  Negative.  Negative for dizziness, tingling, sensory change, speech change, focal weakness, weakness and headaches.  Endo/Heme/Allergies: Negative.  Does not bruise/bleed easily.  Psychiatric/Behavioral: Negative for depression and memory loss. The patient has insomnia (secondary to back pain). The patient is not nervous/anxious.   All other systems reviewed and are negative.  Performance status (ECOG): 1-2  Vitals Blood pressure (!) 132/59, pulse 88, temperature (!) 96.3 F (35.7 C), weight 197 lb 14.4 oz (89.8 kg), SpO2 98 %.   Physical Exam  Constitutional: She is oriented to person, place, and time. She appears well-developed and well-nourished. No distress.  She has a rolling walker by her side.  HENT:  Head: Normocephalic and atraumatic.  Long blonde/graying hair.  Mask.  Eyes: Conjunctivae and EOM are normal. No scleral icterus.  Blue eyes.  Neurological: She is alert and oriented to person, place, and time.  Skin: She is not diaphoretic.  Psychiatric: She has a normal mood and affect.  Her behavior is normal. Judgment and thought content normal.  Nursing note and vitals reviewed.    Imaging studies: 01/13/2017:Abdomen and pelvic CTrevealed irregular thickening of the rectum suspicious for carcinoma. There was a 1.6 cm hypoattenuating lesion in the right hepatic lobe worrisome for metastatic disease. 01/15/2017:Chest CT revealed a 9 mm noncalcified left lower lobe pulmonary nodule (new). 01/16/2017:Pelvic MRIrevealed rectal adenocarcinoma T3N1. There was extension beyond the muscularis propria (17 mm). There were mesorectal nodes >= 5 mm. There was adenopathy on the left side of the mesorectum, including a 9 mm lesion. The distance from the tumor to the anal sphincter was 8 mm. 01/27/2017:PET scanrevealed intense metabolic activity associated rectal mass consists with primary rectal carcinoma. There was a hypermetabolic metastatic lesion to the central LEFT  hepatic lobe. There was metabolic activity associated with small LEFT lower lobe nodule which was most consistent with pulmonary metastasis. 01/31/2017:Liver MRIrevealed a 2.6 x 2.7 x 2.1 cm lesion in segment VIII of the liver near the junction with segment IVa c/w a metastatic lesion. There was a 1.1 x 0.8 cm suspected metastatic lesion peripherally in segment VI of the liver.  05/16/2017:Abdomen and pelvic CTrevealed persistent but decreased asymmetric wall thickening in the rectum. The medial segment left liver lesion was smaller (2.7 x 2.6 cm to 1.9 x 1.6 cm). A tiny lesion seen in segment VI of the liver on previous MRI was not evident on today's CT scan. The left lower lobe pulmonary nodule was smaller (9 mm to 6 mm). There was no new or progressive findings in the abdomen and pelvis. 08/19/2017:Chest CTwith contrast on 08/19/2017 revealed left lower lobe 6 mm solid pulmonary nodule, decreased since 01/15/2017 chest CT. Stable 7 mm right lower lobe pulmonary nodule. There was no new or progressive metastatic disease in the chest.  09/03/2018: Pelvic MRI at [facility] demonstrated a partial response of the primary tumor and extramural disease. Post treatment category ymrT T3b. There was an overall decrease in size in the previously seen perirectal node. Hypointense changes at the site of prior tumor focally contacedt buts does not definitely invade the LEFT levator ani. Tumor contacted, but does not definitely invade the internal anal sphincter. No evidence of residual or recurrent hepatic disease following microwave ablation. 02/17/2018:Abdomen and pelvic CTrevealed evolution of the ablation zone in segment 4A, now measuring 3.7 x 3.1 cm, decreased.There was no findings to suggest viable tumor.She is s/pabdominoperineal resection with left lower quadrant colostomy. There was no evidence of new/progressive metastatic disease.She is s/pleft lower lobe pulmonary wedge resection,  incompletely visualized.  03/04/2018:  Chest CT revealed a stable chest CT (compared to 08/19/2017). The index right lower lobe lung nodule measured 7 mm (unchanged). There was no new or progressive nodularity identified. There was continued decrease in size of ablation defect within segment 4a of the liver.  09/09/2019:  Chest CT at [facility] revealed no intrathoracic metastasis and no new or enlarging pulmonary nodules. 09/09/2019:  Abdomen and pelvis CT at [facility] revealed multiple subcentimeter hypodensities in the dome of the liver, which appeared new/more conspicuous from prior CT. There were not seen on the prior MRI. Further evaluation with MRI was recommended. CT evaluation was limited due to background fatty liver and the heterogeneity/these lesions could have been secondary to fatty liver. There was unchanged soft tissue stranding and nodularity in the pelvis, with a few prominent more nodular areas. There were a few more prominent indeterminate areas and short-term follow up was recommended to ensure stability and exclude peritoneal  disease.  Further evaluation with MRI could have been helpful.  11/12/2019:  Liver and pelvis MRI revealed no findings to explain the patient's history of rising CEA. There was irregular presacral soft tissue with apparent scarring noted in the left paracentral pelvis was similar to prior studies and presumably treatment related. PET-CT could be used, as clinically warranted to exclude hypermetabolic disease within that region. There was an ablation defect identified in segment IV with minimal irregular enhancement along the anterosuperior margin, indeterminate. Attention on follow-up recommended. There was a tiny cyst identified in the dome of the right liver without other findings to suggest metastatic disease in the hepatic parenchyma.   No visits with results within 3 Day(s) from this visit.  Latest known visit with results is:  Infusion on 10/28/2019  Component Date  Value Ref Range Status  . CEA 10/28/2019 54.4* 0.0 - 4.7 ng/mL Final   Comment: (NOTE)                             Nonsmokers          <3.9                             Smokers             <5.6 Roche [employer] Electrochemiluminescence Immunoassay (ECLIA) Values obtained with different assay methods or kits cannot be used interchangeably.  Results cannot be interpreted as absolute evidence of the presence or absence of malignant disease. Performed At: [facility] [place], [place] [id number] Rush Farmer MD ZH:[id number]   . Sodium 10/28/2019 135  135 - 145 mmol/L Final  . Potassium 10/28/2019 3.0* 3.5 - 5.1 mmol/L Final  . Chloride 10/28/2019 96* 98 - 111 mmol/L Final  . CO2 10/28/2019 26  22 - 32 mmol/L Final  . Glucose, Bld 10/28/2019 125* 70 - 99 mg/dL Final  . BUN 10/28/2019 10  8 - 23 mg/dL Final  . Creatinine, Ser 10/28/2019 0.71  0.44 - 1.00 mg/dL Final  . Calcium 10/28/2019 9.0  8.9 - 10.3 mg/dL Final  . Total Protein 10/28/2019 7.3  6.5 - 8.1 g/dL Final  . Albumin 10/28/2019 4.1  3.5 - 5.0 g/dL Final  . AST 10/28/2019 21  15 - 41 U/L Final  . ALT 10/28/2019 18  0 - 44 U/L Final  . Alkaline Phosphatase 10/28/2019 100  38 - 126 U/L Final  . Total Bilirubin 10/28/2019 0.3  0.3 - 1.2 mg/dL Final  . GFR calc non Af Amer 10/28/2019 >60  >60 mL/min Final  . GFR calc Af Amer 10/28/2019 >60  >60 mL/min Final  . Anion gap 10/28/2019 13  5 - 15 Final   Performed at [facility], [address], [place], [place] [address]  . WBC 10/28/2019 8.5  4.0 - 10.5 K/uL Final  . RBC 10/28/2019 4.31  3.87 - 5.11 MIL/uL Final  . Hemoglobin 10/28/2019 13.5  12.0 - 15.0 g/dL Final  . HCT 10/28/2019 39.2  36.0 - 46.0 % Final  . MCV 10/28/2019 91.0  80.0 - 100.0 fL Final  . MCH 10/28/2019 31.3  26.0 - 34.0 pg Final  . MCHC 10/28/2019 34.4  30.0 - 36.0 g/dL Final  . RDW 10/28/2019 12.4  11.5 - 15.5 % Final  . Platelets 10/28/2019 303  150 - 400 K/uL Final    . nRBC 10/28/2019 0.0  0.0 - 0.2 % Final  . Neutrophils Relative % 10/28/2019 74  % Final  . Neutro Abs 10/28/2019 6.3  1.7 - 7.7 K/uL Final  . Lymphocytes Relative 10/28/2019 17  % Final  . Lymphs Abs 10/28/2019 1.5  0.7 - 4.0 K/uL Final  . Monocytes Relative 10/28/2019 7  % Final  . Monocytes Absolute 10/28/2019 0.6  0.1 - 1.0 K/uL Final  . Eosinophils Relative 10/28/2019 1  % Final  . Eosinophils Absolute 10/28/2019 0.1  0.0 - 0.5 K/uL Final  . Basophils Relative 10/28/2019 0  % Final  . Basophils Absolute 10/28/2019 0.0  0.0 - 0.1 K/uL Final  . Immature Granulocytes 10/28/2019 1  % Final  . Abs Immature Granulocytes 10/28/2019 0.08* 0.00 - 0.07 K/uL Final   Performed at [facility], [address] [place], [place] [address]    Assessment:  Tamara Hall is a 64 y.o. female with clinical stage T3N1M1rectal cancer. She presented with a 9 month history of rectal bleeding. Colonoscopyon 01/14/2017 revealed afrond-like/villous non-obstructing large mass in the rectum. The mass was non-circumferential. Biopsies revealed high grade dysplasia within an adenoma (? sampling error). CEAwas 31.6 on 01/15/2017.  PET scanon 01/27/2017 revealed intense metabolic activity associated rectal mass consists with primary rectal carcinoma. There was a hypermetabolic metastatic lesion to the central LEFT hepatic lobe. There was metabolic activity associated with small LEFT lower lobe nodule which was most consistent with pulmonary metastasis.  CT guided liver biopsyat [facility] on 02/07/2017 revealed malignant cells c/w metastatic colorectal adenocarcinoma. Tumor was sent for MMR/MSI, KRAS/NRAS, and BRAF.   CEAhas been followed: 31.6 on 01/15/2017, 38.1 on 02/18/2017, 25.1 on 03/18/2017, 11.8 on 04/08/2017, 9.2 on 04/22/2017, 9.5 on 05/20/2017, 9.6 on 06/19/2017, 3.7 on 02/18/2018, 5.8 on 06/26/2018, 10.5 on 11/20/2018, 19.5 on 03/26/2019, 34.0 on 07/21/2019, 46.4 on 09/09/2019, and  54.4 on 10/28/2019.  She received 2 cycles of FOLFOX (02/18/2017 - 03/04/2017). She developed transient cold induced neuropathy. She developed wheezing and hypoxia with cycle #2 felt secondary to oxaliplatin. She received 4 cycles ofFOLFIRI(03/24/2017 - 04/22/2017) with Neulasta support. Diarrhea was controlled with Imodium.  She underwent CT guided microwave ablation of the liver lesionon 06/06/2017.   She began radiationon 06/18/2017. She receives daily [place] radiation. Radiation and Xeloda completed on 08/15/2017.  She underwentAPR with descending colostomy and VRAM flapon 10/24/2017. She underwent a VATS wedge resectionto her LEFT lung. 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 Final diagnosiswas [id number] rectal adenocarcinoma with metastasis to left lower lobe of the lung.  She has had post-operative pain and ostomy ischemia. She underwent surgical ostomy revisionon 11/28/2017. Pathologyshowed focal mucosal erosion and fat necrosis c/w ischemia. There was extensive underlying fat necrosis. No dysplasia or carcinoma was identified. She was treated with oral Augmentin and wound packing. She has been treated with oxycodone 5 mg po q 4 hrs prn, gabapentin 900 mg TID. She was treated with Bactrim on 01/18/2018. She was referred to local pain management clinic on 01/27/2018.   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  There was hepatic steatosis and post-treatment changes in the pelvis with persistent presacral soft tissue.  There was a 3.5 cm infrarenal abdominal aortic aneurysm.  Chest CT at [facility] on 09/09/2019 revealed no intrathoracic metastasis and no new or enlarging pulmonary nodules.  Abdomen and pelvis CT at  [facility] on 09/09/2019 revealed multiple  subcentimeter hypodensities in the dome of the liver, which appeared new/more conspicuous from prior CT. There were not seen on the prior MRI. Further evaluation with MRI was recommended. CT evaluation was limited due to background fatty liver and the heterogeneity/these lesions could have been secondary to fatty liver. There was unchanged soft tissue stranding and nodularity in the pelvis, with a few prominent more nodular areas. There were a few more prominent indeterminate areas and short-term follow up was recommended to ensure stability and exclude peritoneal disease.  Further evaluation with MRI could have been helpful.   Liver and pelvis MRI on 11/12/2019 showed no findings to explain the patient's history of rising CEA. There was irregular presacral soft tissue with apparent scarring noted in the left paracentral pelvis was similar to prior studies and presumably treatment related. PET-CT could be used, as clinically warranted to exclude hypermetabolic disease within that region. There was an ablation defect identified in segment IV with minimal irregular enhancement along the anterosuperior margin, indeterminate. Attention on follow-up recommended. There was a tiny cyst identified in the dome of the right liver without other findings to suggest metastatic disease in the hepatic parenchyma.  Colonoscopy at [facility] on 09/13/2019 did not appear malignant with no adenomatous (granulation tissue). There was diverticulosis in the descending colon, normal mucosa in the entire examined colon and the examined portion of the ileum. There was a 9 mm lipoma in the transverse colon and a 4 mm polyp at the surgical stoma.   She has a normocytic anemia.She has an 80 pack yearsmoking history. She is smoking.  She was admitted to [facility] from 08/12/2019 to 08/15/2019 for a small bowel obstruction.  She had severe abdominal pain with associated nausea and vomiting.  Abdominal and pelvic CT revealed multiple small  bowel loops which were dilated.  Obstruction resolved spontaneously.   Symptomatically, she feels "fair".  She denies any abdominal symptoms.  CEA is 54.4.  Potassium is 3.0.  Plan: 1.   Labs today:  BMP. 2.   Stage IV rectal carcinoma             Patient presented with stage IV rectal cancer.   Patient received 6 cycles of FOLFOX (02/18/2017 - 04/22/2017).   She underwent CT guided microwave ablation of the liver lesionon 06/06/2017.   She received radiationand daily Xeloda (06/18/2017- 08/15/2017).  She underwentAPR with descending colostomy and VRAM flapon 10/24/2017.   She underwent a VATS wedge resectionto her LEFT lung.  Patient has had severe pain in left SI joint area.  Review concern for rising CEA.                         CEA has increased from 3.7 on 02/18/2018 to 54.4 on 10/28/2019.  Abdomen and pelvis CT at [place] [facility] on 09/09/2019 revealed multiple subcentimeter hypodensities in the liver concerning for metastasis.  Liver and pelvis MRI at [facility] on 11/12/2019 personally reviewed.  Agree with radiology interpretation.   No current explanation for elevated CEA,   Unclear significance of irregular soft tissue in the left pelvis.   Discuss consideration of PET scan. 3.   Recurrent abdominal pain             Patient has had intermittent abdominal pain likely secondary to recurrent partial bowel obstruction.             Colonoscopy revealed no lesions.  Possible adhesions causing issues.             Liver and pelvis MRI on 11/12/2019 reveal no obvious culprit. 4.  Left iliac pain             Etiology remains unlcear.             Pelvic MRI as above.  PET scan to assess activity of soft tissue in left pelvis.             Patient has been seen in the pain clinic. 5.   Dental issues             Patient no longer plans to have dental extractions.             She is planning fillings and crowns.  Continue to monitor. 6.  B12 deficiency             B12 was 122 (low)  on 02/18/2018.             Folate was 12.9 (normal) on 02/18/2018.             Patient previously on B12 injections.             Check B12 and folate at next visit.  7.   Hypokalemia  Potassium is 3.0.  Patient is taking potassium chloride 10 mg po BID.  Increase potassium to 10 meq TID. 8.   RN to call patient with potassium results. 9.   PET scan on 11/19/2019. 10.   RTC after PET scan for MD assessment, review of PET scan and discussion regarding direction of therapy..  I discussed the assessment and treatment plan with the patient.  The patient was provided an opportunity to ask questions and all were answered.  The patient agreed with the plan and demonstrated an understanding of the instructions.  The patient was advised to call back if the symptoms worsen or if the condition fails to improve as anticipated.   Lequita Asal, MD, PhD    11/15/2019, 8:51 AM  I, Selena Batten, am acting as a scribe for Lequita Asal, MD.  I, Gifford Mike Gip, MD, have reviewed the above documentation for accuracy and completeness, and I agree with the above.

## 2019-11-15 ENCOUNTER — Other Ambulatory Visit: Payer: Self-pay

## 2019-11-15 ENCOUNTER — Encounter: Payer: Self-pay | Admitting: Hematology and Oncology

## 2019-11-15 ENCOUNTER — Telehealth: Payer: Self-pay

## 2019-11-15 ENCOUNTER — Inpatient Hospital Stay: Payer: Medicare HMO

## 2019-11-15 ENCOUNTER — Inpatient Hospital Stay: Payer: Medicare HMO | Attending: Hematology and Oncology | Admitting: Hematology and Oncology

## 2019-11-15 VITALS — BP 132/59 | HR 88 | Temp 96.3°F | Wt 197.9 lb

## 2019-11-15 DIAGNOSIS — R102 Pelvic and perineal pain: Secondary | ICD-10-CM

## 2019-11-15 DIAGNOSIS — E876 Hypokalemia: Secondary | ICD-10-CM

## 2019-11-15 DIAGNOSIS — C20 Malignant neoplasm of rectum: Secondary | ICD-10-CM | POA: Insufficient documentation

## 2019-11-15 DIAGNOSIS — E538 Deficiency of other specified B group vitamins: Secondary | ICD-10-CM | POA: Diagnosis not present

## 2019-11-15 DIAGNOSIS — F17210 Nicotine dependence, cigarettes, uncomplicated: Secondary | ICD-10-CM | POA: Insufficient documentation

## 2019-11-15 DIAGNOSIS — G4700 Insomnia, unspecified: Secondary | ICD-10-CM | POA: Diagnosis not present

## 2019-11-15 DIAGNOSIS — C7802 Secondary malignant neoplasm of left lung: Secondary | ICD-10-CM | POA: Insufficient documentation

## 2019-11-15 DIAGNOSIS — C787 Secondary malignant neoplasm of liver and intrahepatic bile duct: Secondary | ICD-10-CM | POA: Insufficient documentation

## 2019-11-15 DIAGNOSIS — R109 Unspecified abdominal pain: Secondary | ICD-10-CM | POA: Insufficient documentation

## 2019-11-15 DIAGNOSIS — Z933 Colostomy status: Secondary | ICD-10-CM | POA: Diagnosis not present

## 2019-11-15 DIAGNOSIS — R970 Elevated carcinoembryonic antigen [CEA]: Secondary | ICD-10-CM

## 2019-11-15 DIAGNOSIS — Z7189 Other specified counseling: Secondary | ICD-10-CM

## 2019-11-15 LAB — BASIC METABOLIC PANEL
Anion gap: 10 (ref 5–15)
BUN: 10 mg/dL (ref 8–23)
CO2: 28 mmol/L (ref 22–32)
Calcium: 9.1 mg/dL (ref 8.9–10.3)
Chloride: 98 mmol/L (ref 98–111)
Creatinine, Ser: 0.72 mg/dL (ref 0.44–1.00)
GFR calc Af Amer: >60 mL/min (ref >60)
GFR calc non Af Amer: >60 mL/min (ref >60)
Glucose, Bld: 117 mg/dL — ABNORMAL HIGH (ref 70–99)
Potassium: 3.2 mmol/L — ABNORMAL LOW (ref 3.5–5.1)
Sodium: 136 mmol/L (ref 135–145)

## 2019-11-15 NOTE — Telephone Encounter (Signed)
Informed patient of low potassium levels. Patient made aware that Dr. Mike Gip would like for her to increase her Potassium to 10 MEQ TID instead of 10 MEQ BID. Educated patient on potassium rich foods as well. Patient states she does not need a refill at this time but will contact office when she is running low. Patient verbalizes understanding and denies any further questions or concerns.

## 2019-11-15 NOTE — Progress Notes (Signed)
No new changes noted today 

## 2019-11-15 NOTE — Telephone Encounter (Signed)
-----   Message from Lequita Asal, MD sent at 11/15/2019  4:14 PM EST ----- Regarding: Please call patient  How much potassium is she taking?  Level is still low.  Increase potassium from 2 pills per day to 3 pills per day.  Does she need more?  M ----- Message ----- From: Buel Ream, [facility] [place] Sent: 11/15/2019   9:39 AM EST To: Lequita Asal, MD

## 2019-11-18 ENCOUNTER — Other Ambulatory Visit: Payer: Self-pay | Admitting: Hematology and Oncology

## 2019-11-18 NOTE — Telephone Encounter (Signed)
   Ref Range & Units 3 d ago  Potassium 3.5 - 5.1 mmol/L 3.2Low

## 2019-11-29 ENCOUNTER — Other Ambulatory Visit: Payer: Self-pay | Admitting: Hematology and Oncology

## 2019-11-29 ENCOUNTER — Other Ambulatory Visit: Payer: Self-pay

## 2019-11-29 ENCOUNTER — Ambulatory Visit
Admission: RE | Admit: 2019-11-29 | Discharge: 2019-11-29 | Disposition: A | Discharge status: Home / Self Care | Payer: Medicare HMO | Source: Ambulatory Visit | Attending: Hematology and Oncology | Admitting: Hematology and Oncology

## 2019-11-29 DIAGNOSIS — C20 Malignant neoplasm of rectum: Secondary | ICD-10-CM

## 2019-11-29 DIAGNOSIS — R102 Pelvic and perineal pain: Secondary | ICD-10-CM | POA: Diagnosis not present

## 2019-11-29 DIAGNOSIS — R970 Elevated carcinoembryonic antigen [CEA]: Secondary | ICD-10-CM | POA: Insufficient documentation

## 2019-11-29 DIAGNOSIS — Z923 Personal history of irradiation: Secondary | ICD-10-CM | POA: Diagnosis not present

## 2019-11-29 DIAGNOSIS — C3490 Malignant neoplasm of unspecified part of unspecified bronchus or lung: Secondary | ICD-10-CM | POA: Insufficient documentation

## 2019-11-29 LAB — GLUCOSE, CAPILLARY: Glucose-Capillary: 83 mg/dL (ref 70–99)

## 2019-11-29 MED ORDER — FLUDEOXYGLUCOSE F - 18 (FDG) INJECTION
10.2000 | Freq: Once | INTRAVENOUS | Status: AC | PRN
  Administered 2019-11-29: 10.6 via INTRAVENOUS

## 2019-12-01 ENCOUNTER — Other Ambulatory Visit: Payer: Self-pay | Admitting: Hematology and Oncology

## 2019-12-02 ENCOUNTER — Other Ambulatory Visit: Payer: Medicare HMO

## 2019-12-02 ENCOUNTER — Encounter: Payer: Self-pay | Admitting: Hematology and Oncology

## 2019-12-02 ENCOUNTER — Inpatient Hospital Stay: Payer: Medicare HMO | Admitting: Hematology and Oncology

## 2019-12-02 ENCOUNTER — Other Ambulatory Visit: Payer: Self-pay

## 2019-12-02 NOTE — Progress Notes (Signed)
[facility]  [address], [address] [place], [place] [address] Phone: [phone]  Fax: [phone]   Clinic Day:  12/03/2019  Referring physician: Georga Kaufmann, NP  Chief Complaint: Tamara Hall is a 64 y.o. 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   HPI: The patient was last seen in the medical oncology clinic on 11/14/2018. At that time, she felt "fair".  She denied any abdominal symptoms. CEA was 54.4. Potassium was 3.0. Liver and pelvis MRI revealed no evidence of metastatic disease.  We planned for a PET scan. Patient was taking oral potassium chloride 10 mg BID. Her potassium was increased to TID.   Patient was seen by Trudee Grip, CPP on 11/23/2018 for pain management. She stopped Percocet. Norco was increased to 5 tablets/day. She remained on gabapentin. She will follow up in 1 month.   PET scan on 11/29/2019 showed local recurrence of hypermetabolic nodular lung malignancy at the resection margin in the LEFT lower lobe (1.6 x 1.5 cm). There was no evidence of hepatic metastasis. There was no evidence of local recurrence in pelvis.  There was insufficiency fracture in the LEFT sacral ala presumably related to radiation treatment.   She was presented at tumor board on 12/02/2019.  We discussed chest CT and consideration of surgery.  We discussed possible neoadjuvant chemotherapy.  During the interim, she has felt happy, nervous, scared and excited.  She has had no abdominal pain. She would like to be prescribed medication for her insomnia. Patient agreed to chest CT with contrast. Patient perfers to be seen by surgeon at [facility] instead of [facility]. She feels stressful and is tearful from PET scan results.   She is taking oral potassium 3 times a day.    Past Medical History:  Diagnosis Date    . Cancer ([place])    colon  . Hypertension   . Pneumonia    20+ years ago    Past Surgical History:  Procedure Laterality Date  . ABDOMINAL HYSTERECTOMY    . BREAST SURGERY    . COLONOSCOPY WITH PROPOFOL N/A 01/14/2017   Procedure: COLONOSCOPY WITH PROPOFOL;  Surgeon: Lucilla Lame, MD;  Location: [facility] ENDOSCOPY;  Service: Endoscopy;  Laterality: N/A;  . ELBOW SURGERY Right    Has had 4 surgeries on right elbow.  Marland Kitchen HAND SURGERY Right 2008  . JOINT REPLACEMENT    . PORTA CATH INSERTION N/A 01/29/2017   Procedure: Glori Luis Cath Insertion;  Surgeon: Algernon Huxley, MD;  Location: [place] [facility];  Service: Cardiovascular;  Laterality: N/A;  . TONSILLECTOMY      Family History  Problem Relation Age of Onset  . Lung cancer Mother   . Lung cancer Father   . Brain cancer Maternal Grandmother     Social History:  reports that she has been smoking cigarettes. She has a 80.00 pack-year smoking history. She has never used smokeless tobacco. She reports that she does not drink alcohol or use drugs. She was smoking 1/2 packs/day. She stopped smoking 10/24/2017. She lives in [place]. She has 3 children (ages 33, 32, and 48). She smoked 2 packs a day. She started smoking at age 6. Stopped smoking on 10/24/2017. 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 [name] is her significant other. The  patient is alone today.  Allergies: No Known Allergies  Current Medications: Current Outpatient Medications  Medication Sig Dispense Refill  . amLODipine (NORVASC) 5 MG tablet Take 5 mg by mouth daily.    . baclofen (LIORESAL) 10 MG tablet Take 10 mg by mouth 3 (three) times daily.    Marland Kitchen gabapentin (NEURONTIN) 300 MG capsule TAKE 1 CAPSULE BY MOUTH THREE TIMES A DAY (Patient taking differently: Take 1,200 mg by mouth 3 (three) times daily. ) 90 capsule 0  . gabapentin (NEURONTIN) 300 MG capsule Take by mouth.    . hydrochlorothiazide (HYDRODIURIL) 25 MG  tablet Take 25 mg by mouth daily.    Marland Kitchen HYDROcodone-acetaminophen (NORCO/VICODIN) 5-325 MG tablet Take 1 tablet by mouth every 6 (six) hours as needed for moderate pain. 15 tablet 0  . KLOR-CON M10 10 MEQ tablet TAKE 1 TABLET (10 MEQ TOTAL) BY MOUTH 2 (TWO) TIMES DAILY. 30 tablet 0  . nortriptyline (PAMELOR) 10 MG capsule Take 10 mg by mouth at bedtime.    Marland Kitchen LORazepam (ATIVAN) 0.5 MG tablet Take one tablet (0.5 mg) by mouth 30 minutes prior to the procedure.  Patient must have a driver. (Patient not taking: Reported on 12/02/2019) 2 tablet 0  . ondansetron (ZOFRAN ODT) 8 MG disintegrating tablet Take 1 tablet (8 mg total) by mouth every 8 (eight) hours as needed for nausea or vomiting. (Patient not taking: Reported on 12/02/2019) 20 tablet 1   No current facility-administered medications for this visit.    Review of Systems  Constitutional: Negative for chills, diaphoresis, fever, malaise/fatigue and weight loss (up 2 lbs).       Back pain effects ADLs. Relies heavily on rolling walker.  HENT: Negative.  Negative for congestion, ear pain, hearing loss, nosebleeds, sinus pain and sore throat.        Dental work planned on 4 teeth (fillings and crowns).  Eyes: Negative.  Negative for blurred vision, double vision and photophobia.  Respiratory: Negative.  Negative for cough, hemoptysis, sputum production and shortness of breath (exertional- improved).   Cardiovascular: Negative.  Negative for chest pain, palpitations and leg swelling.  Gastrointestinal: Negative for abdominal pain (intermittent labor-like pains; improved), blood in stool, constipation, diarrhea, heartburn (on PPI), melena, nausea and vomiting.       Colostomy s/p APR.  Normal bowels.  Genitourinary: Negative.  Negative for dysuria, frequency, hematuria and urgency.  Musculoskeletal: Negative for back pain (pain increases with movement and when laying down), falls, joint pain, myalgias and neck pain.       Difficulty laying down due  to pain.  Skin: Negative.  Negative for itching and rash.  Neurological: Negative.  Negative for dizziness, tingling, sensory change, speech change, focal weakness, weakness and headaches.  Endo/Heme/Allergies: Negative.  Does not bruise/bleed easily.  Psychiatric/Behavioral: Negative for depression and memory loss. The patient is nervous/anxious and has insomnia.        Emotional.  All other systems reviewed and are negative.  Performance status (ECOG):  1-2  Vitals Blood pressure (!) 138/54, pulse 85, temperature (!) 97.3 F (36.3 C), temperature source Tympanic, resp. rate 18, height 5' 7"  (1.702 m), weight 199 lb (90.3 kg), SpO2 98 %.   Physical Exam  Constitutional: She is oriented to person, place, and time. She appears well-developed and well-nourished. No distress.  She has a rolling walker by her side.  HENT:  Head: Normocephalic and atraumatic.  Long blonde/graying hair pulled back.  Mask.  Eyes: Conjunctivae and EOM are normal. No  scleral icterus.  Blue eyes.  Neurological: She is alert and oriented to person, place, and time.  Skin: No rash noted. She is not diaphoretic. No pallor.  Psychiatric: She has a normal mood and affect. Her behavior is normal. Judgment and thought content normal.  Nursing note and vitals reviewed.   Imaging studies: 01/13/2017:Abdomen and pelvic CTrevealed irregular thickening of the rectum suspicious for carcinoma. There was a 1.6 cm hypoattenuating lesion in the right hepatic lobe worrisome for metastatic disease. 01/15/2017:Chest CT revealed a 9 mm noncalcified left lower lobe pulmonary nodule (new). 01/16/2017:Pelvic MRIrevealed rectal adenocarcinoma T3N1. There was extension beyond the muscularis propria (17 mm). There were mesorectal nodes >= 5 mm. There was adenopathy on the left side of the mesorectum, including a 9 mm lesion. The distance from the tumor to the anal sphincter was 8 mm. 01/27/2017:PET scanrevealed intense  metabolic activity associated rectal mass consists with primary rectal carcinoma. There was a hypermetabolic metastatic lesion to the central LEFT hepatic lobe. There was metabolic activity associated with small LEFT lower lobe nodule which was most consistent with pulmonary metastasis. 01/31/2017:Liver MRIrevealed a 2.6 x 2.7 x 2.1 cm lesion in segment VIII of the liver near the junction with segment IVa c/w a metastatic lesion. There was a 1.1 x 0.8 cm suspected metastatic lesion peripherally in segment VI of the liver.  05/16/2017:Abdomen and pelvic CTrevealed persistent but decreased asymmetric wall thickening in the rectum. The medial segment left liver lesion was smaller (2.7 x 2.6 cm to 1.9 x 1.6 cm). A tiny lesion seen in segment VI of the liver on previous MRI was not evident on today's CT scan. The left lower lobe pulmonary nodule was smaller (9 mm to 6 mm). There was no new or progressive findings in the abdomen and pelvis. 08/19/2017:Chest CTwith contrast on 08/19/2017 revealed left lower lobe 6 mm solid pulmonary nodule, decreased since 01/15/2017 chest CT. Stable 7 mm right lower lobe pulmonary nodule. There was no new or progressive metastatic disease in the chest.  09/03/2018: Pelvic MRI at [facility] demonstrated a partial response of the primary tumor and extramural disease. Post treatment category ymrT T3b. There was an overall decrease in size in the previously seen perirectal node. Hypointense changes at the site of prior tumor focally contacedt buts does not definitely invade the LEFT levator ani. Tumor contacted, but does not definitely invade the internal anal sphincter. No evidence of residual or recurrent hepatic disease following microwave ablation. 02/17/2018:Abdomen and pelvic CTrevealed evolution of the ablation zone in segment 4A, now measuring 3.7 x 3.1 cm, decreased.There was no findings to suggest viable tumor.She is s/pabdominoperineal resection with left  lower quadrant colostomy. There was no evidence of new/progressive metastatic disease.She is s/pleft lower lobe pulmonary wedge resection, incompletely visualized. 03/04/2018:Chest CTrevealed a stable chest CT (compared to11/13/2018). The index right lower lobe lung nodule measured7 mm (unchanged). There was no new or progressive nodularity identified. There was continued decrease in size of ablation defect within segment 4a of the liver. 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 There was a 3.5 cm infrarenal abdominal aortic aneurysm. 09/09/2019:Chest CT at [facility] no intrathoracic metastasis and no new or enlarging pulmonary nodules. 09/09/2019:Abdomen and pelvis CT at [facility] multiple subcentimeter hypodensities in the dome of the liver, which appeared new/more conspicuous from prior CT. There were not seen  on the prior MRI. Further evaluation with MRI was recommended. CT evaluationwas limited due to background fatty liver and the heterogeneity/these lesions could have been secondary to fatty liver. There was unchanged soft tissue stranding and nodularity in the pelvis, with a few prominent more nodular areas. There were a few more prominent indeterminateareasand short-term follow up was recommended to ensure stability and exclude peritoneal disease. Further evaluation with MRI could have been helpful.  11/12/2019:  Liver and pelvis MRI revealed no findings to explain the patient's history of rising CEA. There was irregular presacral soft tissue with apparent scarring noted in the left paracentral pelvis was similar to prior studies and presumably treatment related. PET-CT could be used, as clinically warranted to exclude hypermetabolic disease within that  region. There was an ablation defect identified in segment IV with minimal irregular enhancement along the anterosuperior margin, indeterminate. Attention on follow-up recommended. There was a tiny cyst identified in the dome of the right liver without other findings to suggest metastatic disease in the hepatic parenchyma. 11/29/2019:  PET scan revealed 1.6 x 1.5 cm nodular thickening at the resection margin (SUV 11.3) in the LEFT lower lobe. There was no evidence of hepatic metastasis. There was no evidence of local recurrence in pelvis.  There was insufficiency fracture in the LEFT sacral ala presumably related to radiation treatment.   No visits with results within 3 Day(s) from this visit.  Latest known visit with results is:  Hospital Outpatient Visit on 11/29/2019  Component Date Value Ref Range Status  . Glucose-Capillary 11/29/2019 83  70 - 99 mg/dL Final    Assessment:  Tamara Hall is a 64 y.o. female with clinical stage T3N1M1rectal cancer. She presented with a 9 month history of rectal bleeding. Colonoscopyon 01/14/2017 revealed afrond-like/villous non-obstructing large mass in the rectum. The mass was non-circumferential. Biopsies revealed high grade dysplasia within an adenoma (? sampling error). CEAwas 31.6 on 01/15/2017.  PET scanon 01/27/2017 revealed intense metabolic activity associated rectal mass consists with primary rectal carcinoma. There was a hypermetabolic metastatic lesion to the central LEFT hepatic lobe. There was metabolic activity associated with small LEFT lower lobe nodule which was most consistent with pulmonary metastasis.  CT guided liver biopsyat [facility] on 02/07/2017 revealed malignant cells c/w metastatic colorectal adenocarcinoma. Tumor was sent for MMR/MSI, KRAS/NRAS, and BRAF.   CEAhas been followed: 31.6 on 01/15/2017, 38.1 on 02/18/2017, 25.1 on 03/18/2017, 11.8 on 04/08/2017, 9.2 on 04/22/2017, 9.5 on 05/20/2017, 9.6 on 06/19/2017, 3.7  on 02/18/2018,5.8 on 06/26/2018, 10.5 on 11/20/2018, 19.5 on 03/26/2019, 34.0 on 07/21/2019, 46.4 on 09/09/2019, and 54.4 on 10/28/2019.  She received 2 cycles of FOLFOX (02/18/2017 - 03/04/2017). She developed transient cold induced neuropathy. She developed wheezing and hypoxia with cycle #2 felt secondary to oxaliplatin. She received 4 cycles ofFOLFIRI(03/24/2017 - 04/22/2017) with Neulasta support. Diarrhea was controlled with Imodium.  She underwent CT guided microwave ablation of the liver lesionon 06/06/2017.   She began radiationon 06/18/2017. She received daily Xelodawith radiation. Radiation and Xeloda completed on 08/15/2017.  She underwentAPR with descending colostomy and VRAM flapon 10/24/2017. She underwent a VATS wedge resectionto her LEFT lung. Pathology from the lung demonstrated a 1 cm metastatic intestinal type adenocarcinoma c/w metastasis from patient's known colorectal carcinoma.   Pathology from the rectal resection revealed a 2.1 cm invasive moderately differentiated adenocarcinoma arising in a tubular adenoma with transmural invasion focally into the perirectal soft tissue.  There was no lymphovascular or perineural invasion.  Margins were negative.  Zero  of 20 lymph nodes were positive.  Final diagnosiswas [id number] rectal adenocarcinoma with metastasis to left lower lobe of the lung.  She has had post-operative pain and ostomy ischemia. She underwent surgical ostomy revisionon 11/28/2017. Pathologyshowed focal mucosal erosion and fat necrosis c/w ischemia. There was extensive underlying fat necrosis. No dysplasia or carcinoma was identified. She was treated with oral Augmentin and wound packing. She has been treated with oxycodone 5 mg po q 4 hrs prn, gabapentin 900 mg TID. She was treated with Bactrim on 01/18/2018. She was referred to local pain management clinic on 01/27/2018.  PET scan on 11/29/2019 showed local recurrence of hypermetabolic  nodular lung malignancy at the resection margin  (1.6 x 1.5 cm; SUV 11.3) in the LEFT lower lobe.  There was no evidence of hepatic metastasis. There was no evidence of local recurrence in pelvis.  There was insufficiency fracture in the LEFT sacral ala presumably related to radiation treatment.   Colonoscopy at [facility] 09/13/2019 did not appear malignant with no adenomatous (granulation tissue). There was diverticulosis in the descending colon, normal mucosa in the entire examined colon and the examined portion of the ileum. There was a 9 mmlipoma in the transverse colon anda4 mm polyp at the surgical stoma.   She has a normocytic anemia.She has an 80 pack yearsmoking history. She is smoking.  [facility] for a smallbowel obstruction. She had severe abdominal pain with associated nausea and vomiting. Abdominal and pelvicCT revealedmultiple small bowel loops whichwere dilated.Obstruction resolved spontaneously.  Symptomatically, she denies any abdominal pain.  She denies any respiratory symptoms.  Plan: 1.    Labs today: BMP, B12, folate, alpha-gal. 2.   Stage IV rectal carcinoma Patient presented with stage IV rectal cancer.              Patient received 6 cycles of FOLFOX (02/18/2017 - 04/22/2017).              She underwent CT guided microwave ablation of the liver lesionon 06/06/2017.             She received radiationand daily Xeloda (06/18/2017- 08/15/2017).            She underwentAPR with descending colostomy and VRAM flapon 10/24/2017.              She underwent a VATS wedge resectionto her LEFT lung.             CEA has increased from 3.7 on 05/15/2019to54.4 on 10/28/2019.             [facility]dom[facility] and [facility] CT at [facility] on 09/09/2019 revealed multiple subcentimeter hypodensities in the liver concerning for metastasis.              Liver and pelvis MRI at [facility] on 11/12/2019 revealed irregular soft tissue in the  left pelvis (unclear significance).             PET scan on 11/29/2019 personally reviewed.  Agree with radiology interpretation.   She has recurrent disease at the resection margin.  Discuss tumor board conversation.   Chest CT to assess resection margin.   Referral to cardiothoracic surgeon (Dr. Genevive Bi).   Per NCCN guidelines    Resection (preferred) and/or local therapy (SBRT).    Neoadjuvant chemotherapy for 2-3 months prior to resection or local therapy.  Patient is agreed in agreement with plan.  Anticipate sending off tissue for NexGen sequencing.   If tissue unavailable, send tissue for testing from [facility] case  [id number]. 3. Recurrent abdominal pain She denies any current pain.    Patient has a history of intermittent abdominal pain likely secondary to recurrent partial bowel obstruction. Colonoscopy revealed no lesions. Possible adhesions causing issues.  Liver and pelvis MRI on 11/12/2019 reveal no obvious culprit.  PET scan revealed no intra-abdominal metastatic disease. 4. Left iliac pain Etiology remains unlcear. Pelvic MRI revealed no evidence of metastasis.             PET scan revealed no activity in soft tissue in left pelvis. Patient has been seen in the pain clinic. 5. B12 deficiency and folate deficiency B12 223 (low) today- available after clinic. Folate 5.8 (low) today- available after clinic. Patient was previously on B12 injections. Patient to be contacted regarding re-initiation of B12 injections or 1 month trial of oral B12.  Patient contacted after clinic to initiate folate 1 mg a day.   Recheck level in 1 month.  6.   Hypokalemia             Potassium was 3.0 and 3.5 today.             Continue potassium 10 meq TID. 7.   Chest CT on 12/07/2019. 8.   Consult thoracic surgery (Dr Genevive Bi) after chest CT. 9.   Tumor board on  12/09/2019. 10.   RTC on 12/10/2019 for MD assessment and discussion regarding direction of therapy.  I discussed the assessment and treatment plan with the patient.  The patient was provided an opportunity to ask questions and all were answered.  The patient agreed with the plan and demonstrated an understanding of the instructions.  The patient was advised to call back if the symptoms worsen or if the condition fails to improve as anticipated.  I provided 20 minutes of face-to-face time during this this encounter and > 50% was spent counseling as documented under my assessment and plan.    Lequita Asal, MD, PhD    12/03/2019, 11:49 AM  I, Selena Batten, am acting as scribe for [employer]. Mike Gip, MD, PhD.  I, Suzanne Garbers C. Mike Gip, MD, have reviewed the above documentation for accuracy and completeness, and I agree with the above.

## 2019-12-02 NOTE — Progress Notes (Signed)
No new changes noted today. The patient name and DOB has been verified by phone today. 

## 2019-12-02 NOTE — Progress Notes (Signed)
Tumor Board Documentation  Tamara Hall was presented by Dr Mike Gip at our Tumor Board on 12/02/2019, which included representatives from medical oncology, radiation oncology, surgical oncology, surgical, radiology, pathology, navigation, genetics, pharmacy, internal medicine, research, palliative care, pulmonology.  Tamara Hall currently presents as a current patient, for discussion, for progression with history of the following treatments: adjuvant chemotherapy, adjuvant radiation.  Additionally, we reviewed previous medical and familial history, history of present illness, and recent lab results along with all available histopathologic and imaging studies. The tumor board considered available treatment options and made the following recommendations: Neoadjuvant chemotherapy, Surgery, Additional screening Dedicated CT of Chest first possible resection  The following procedures/referrals were also placed: No orders of the defined types were placed in this encounter.   Clinical Trial Status: not discussed   Staging used:    AJCC Staging:       Group: Stage 4 Rectal Cac[place]   National site-specific guidelines   were discussed with respect to the case.  Tumor board is a meeting of clinicians from various specialty areas who evaluate and discuss patients for whom a multidisciplinary approach is being considered. Final determinations in the plan of care are those of the provider(s). The responsibility for follow up of recommendations given during tumor board is that of the provider.   Today's extended care, comprehensive team conference, [name] was not present for the discussion and was not examined.   Multidisciplinary Tumor Board is a multidisciplinary case peer review process.  Decisions discussed in the Multidisciplinary Tumor Board reflect the opinions of the specialists present at the conference without having examined the patient.  Ultimately, treatment and diagnostic decisions rest with  the primary provider(s) and the patient.

## 2019-12-03 ENCOUNTER — Telehealth: Payer: Self-pay

## 2019-12-03 ENCOUNTER — Encounter: Payer: Self-pay | Admitting: Hematology and Oncology

## 2019-12-03 ENCOUNTER — Inpatient Hospital Stay (HOSPITAL_BASED_OUTPATIENT_CLINIC_OR_DEPARTMENT_OTHER): Payer: Medicare HMO | Admitting: Hematology and Oncology

## 2019-12-03 VITALS — BP 138/54 | HR 85 | Temp 97.3°F | Resp 18 | Ht 67.0 in | Wt 199.0 lb

## 2019-12-03 DIAGNOSIS — E538 Deficiency of other specified B group vitamins: Secondary | ICD-10-CM

## 2019-12-03 DIAGNOSIS — C20 Malignant neoplasm of rectum: Secondary | ICD-10-CM | POA: Diagnosis not present

## 2019-12-03 DIAGNOSIS — E876 Hypokalemia: Secondary | ICD-10-CM | POA: Diagnosis not present

## 2019-12-03 DIAGNOSIS — Z7189 Other specified counseling: Secondary | ICD-10-CM

## 2019-12-03 DIAGNOSIS — C7802 Secondary malignant neoplasm of left lung: Secondary | ICD-10-CM | POA: Diagnosis not present

## 2019-12-03 LAB — BASIC METABOLIC PANEL
Anion gap: 12 (ref 5–15)
BUN: 8 mg/dL (ref 8–23)
CO2: 27 mmol/L (ref 22–32)
Calcium: 9.3 mg/dL (ref 8.9–10.3)
Chloride: 95 mmol/L — ABNORMAL LOW (ref 98–111)
Creatinine, Ser: 0.66 mg/dL (ref 0.44–1.00)
GFR calc Af Amer: >60 mL/min (ref >60)
GFR calc non Af Amer: >60 mL/min (ref >60)
Glucose, Bld: 93 mg/dL (ref 70–99)
Potassium: 3.5 mmol/L (ref 3.5–5.1)
Sodium: 134 mmol/L — ABNORMAL LOW (ref 135–145)

## 2019-12-03 LAB — FOLATE: Folate: 5.8 ng/mL — ABNORMAL LOW (ref >5.9)

## 2019-12-03 LAB — VITAMIN B12: Vitamin B-12: 223 pg/mL (ref 180–914)

## 2019-12-03 NOTE — Patient Instructions (Signed)
Melatonin oral solid dosage forms What is this medicine? MELATONIN (mel uh TOH nin) is a dietary supplement. It is mostly promoted to help maintain normal sleep patterns. The FDA has not approved this supplement for any medical use. This supplement may be used for other purposes; ask your health care provider or pharmacist if you have questions. This medicine may be used for other purposes; ask your health care provider or pharmacist if you have questions. COMMON BRAND NAME(S): Melatonex What should I tell my health care provider before I take this medicine? They need to know if you have any of these conditions:  cancer  depression or mental illness  diabetes  hormone problems  if you often drink alcohol  immune system problems  liver disease  lung or breathing disease, like asthma  organ transplant  seizure disorder  an unusual or allergic reaction to melatonin, other medicines, foods, dyes, or preservatives  pregnant or trying to get pregnant  breast-feeding How should I use this medicine? Take this supplement by mouth with a glass of water. Do not take with food. This supplement is usually taken 1 or 2 hours before bedtime. After taking this supplement, limit your activities to those needed to prepare for bed. Some products may be chewed or dissolved in the mouth before swallowing. Some tablets or capsules must be swallowed whole; do not cut, crush or chew. Follow the directions on the package labeling, or take as directed by your health care professional. Do not take this supplement more often than directed. Talk to your pediatrician regarding the use of this supplement in children. Special care may be needed. This supplement is not recommended for use in children without a prescription. Overdosage: If you think you have taken too much of this medicine contact a poison control center or emergency room at once. NOTE: This medicine is only for you. Do not share this medicine  with others. What if I miss a dose? If you miss taking your dose at the usual time, skip that dose. If it is almost time for your next dose, take only that dose. Do not take double or extra doses. What may interact with this medicine? Do not take this medicine with any of the following medications:  fluvoxamine  ramelteon  tasimelteon This medicine may also interact with the following medications:  alcohol  caffeine  carbamazepine  certain antibiotics like ciprofloxacin  certain medicines for depression, anxiety, or psychotic disturbances  cimetidine  female hormones, like estrogens and birth control pills, patches, rings, or injections  methoxsalen  nifedipine  other medications for sleep  other herbal or dietary supplements  phenobarbital  rifampin  smoking tobacco  tamoxifen  warfarin This list may not describe all possible interactions. Give your health care provider a list of all the medicines, herbs, non-prescription drugs, or dietary supplements you use. Also tell them if you smoke, drink alcohol, or use illegal drugs. Some items may interact with your medicine. What should I watch for while using this medicine? See your doctor if your symptoms do not get better or if they get worse. Do not take this supplement for more than 2 weeks unless your doctor tells you to. You may get drowsy or dizzy. Do not drive, use machinery, or do anything that needs mental alertness until you know how this medicine affects you. Do not stand or sit up quickly, especially if you are an older patient. This reduces the risk of dizzy or fainting spells. Alcohol may interfere with  the effect of this medicine. Avoid alcoholic drinks. After taking this medicine, you may get up out of bed and do an activity that you do not know you are doing. The next morning, you may have no memory of this. Activities include driving a car ("sleep-driving"), making and eating food, talking on the phone,  sexual activity, and sleep-walking. Serious injuries have occurred. Call your doctor right away if you find out you have done any of these activities. Do not take this medicine if you have used alcohol that evening. Do not take it if you have taken another medicine for sleep. The risk of doing these sleep-related activities is higher. Talk to your doctor before you use this supplement if you are currently being treated for an emotional, mental, or sleep problem. This medicine may interfere with your treatment. Herbal or dietary supplements are not regulated like medicines. Rigid quality control standards are not required for dietary supplements. The purity and strength of these products can vary. The safety and effect of this dietary supplement for a certain disease or illness is not well known. This product is not intended to diagnose, treat, cure or prevent any disease. The Food and Drug Administration suggests the following to help consumers protect themselves:  Always read product labels and follow directions.  Natural does not mean a product is safe for humans to take.  Look for products that include USP after the ingredient name. This means that the manufacturer followed the standards of the [place] Pharmacopoeia.  Supplements made or sold by a nationally known food or drug company are more likely to be made under tight controls. You can write to the company for more information about how the product was made. What side effects may I notice from receiving this medicine? Side effects that you should report to your doctor or health care professional as soon as possible:  allergic reactions like skin rash, itching or hives, swelling of the face, lips, or tongue  breathing problems  confusion  depressed mood, irritable, or other changes in moods or behaviors  feeling faint or lightheaded, falls  increased blood pressure  irregular or missed menstrual periods  signs and symptoms of liver  injury like dark yellow or brown urine; general ill feeling or flu-like symptoms; light-colored stools; loss of appetite; nausea; right upper belly pain; unusually weak or tired; yellowing of the eyes or skin  trouble staying awake or alert during the day  unusual activities while you are still asleep like driving, eating, making phone calls  unusual bleeding or bruising Side effects that usually do not require medical attention (report to your doctor or health care professional if they continue or are bothersome):  dizziness  drowsiness  headache  hot flashes  nausea  tiredness  unusual dreams or nightmares  upset stomach This list may not describe all possible side effects. Call your doctor for medical advice about side effects. You may report side effects to FDA at 1-800-FDA-1088. Where should I keep my medicine? Keep out of the reach of children. Store at room temperature or as directed on the package label. Protect from moisture. Throw away any unused supplement after the expiration date. NOTE: This sheet is a summary. It may not cover all possible information. If you have questions about this medicine, talk to your doctor, pharmacist, or health care provider.  2020 Elsevier/Gold Standard (2018-03-20 12:11:58)

## 2019-12-03 NOTE — Telephone Encounter (Signed)
New patient information has been faxed over to Dr Genevive Bi office. fax conformation confirmed.

## 2019-12-03 NOTE — Telephone Encounter (Signed)
-----   Message from Lequita Asal, MD sent at 12/03/2019  2:56 PM EST ----- Regarding: Please call patient  Folate low.  Begin folic acid 1 mg a day.  M ----- Message ----- From: Buel Ream, Lab In [place] Sent: 12/03/2019   1:13 PM EST To: Lequita Asal, MD

## 2019-12-03 NOTE — Telephone Encounter (Signed)
Spoke with the patient to inform her that folate was low and per dr Mike Gip she would like her to start Folic acid 1 mg daily. The patient was understanding and agreeable.

## 2019-12-04 DIAGNOSIS — E538 Deficiency of other specified B group vitamins: Secondary | ICD-10-CM | POA: Insufficient documentation

## 2019-12-06 ENCOUNTER — Telehealth: Payer: Self-pay

## 2019-12-06 ENCOUNTER — Other Ambulatory Visit: Payer: Self-pay | Admitting: Hematology and Oncology

## 2019-12-06 NOTE — Telephone Encounter (Signed)
-----   Message from Lequita Asal, MD sent at 12/04/2019  4:39 PM EST ----- Regarding: Please call patient  B12 and folate are both low.  I think a previous message was sent to begin folic acid 1 mg a day.  She was previously on B12 injections.  Does she want to start again?  M ----- Message ----- From: Buel Ream, Lab In [place] Sent: 12/03/2019   1:13 PM EST To: Lequita Asal, MD

## 2019-12-06 NOTE — Telephone Encounter (Signed)
Spoke with the patient to inform her that her B-12 levels was also low today. I informed the patient that per Dr Mike Gip would she like to get the B-12 injection. The patient was agreeable but would like to have them done in [place] because it is more closer for her to drive. I have informed Ms Shirlean Mylar to get the patient schedule for B-12 injection. The patient was understanding and agreeable.

## 2019-12-08 ENCOUNTER — Other Ambulatory Visit: Payer: Self-pay | Admitting: Hematology and Oncology

## 2019-12-08 DIAGNOSIS — E538 Deficiency of other specified B group vitamins: Secondary | ICD-10-CM

## 2019-12-08 MED ORDER — FOLIC ACID 1 MG PO TABS: 1.0000 mg | ORAL_TABLET | Freq: Every day | ORAL | 3 refills | Status: DC

## 2019-12-09 ENCOUNTER — Inpatient Hospital Stay: Payer: Medicare HMO

## 2019-12-09 ENCOUNTER — Telehealth: Payer: Self-pay

## 2019-12-09 LAB — MISC LABCORP TEST (SEND OUT): Labcorp test code: 806562

## 2019-12-09 NOTE — Telephone Encounter (Signed)
Spoke with the patient to see if she wa able to pick up the folic acid 1 mg. Requested from Dr Mike Gip. The patient say she has went to several stores and was not successful in finding that medication. The patient states she is going to try [facility] in [place] tomorrow and she is to reach out to the clinic and inform us that she was able to get the folic acid 1 mg daily. The patient was understanding and agreeable.

## 2019-12-10 ENCOUNTER — Ambulatory Visit: Payer: Medicaid Other | Admitting: Cardiothoracic Surgery

## 2019-12-10 ENCOUNTER — Inpatient Hospital Stay: Payer: Medicare HMO

## 2019-12-13 ENCOUNTER — Ambulatory Visit
Admission: RE | Admit: 2019-12-13 | Discharge: 2019-12-13 | Disposition: A | Discharge status: Home / Self Care | Payer: Medicare HMO | Source: Ambulatory Visit | Attending: Hematology and Oncology | Admitting: Hematology and Oncology

## 2019-12-13 ENCOUNTER — Other Ambulatory Visit: Payer: Self-pay

## 2019-12-13 ENCOUNTER — Inpatient Hospital Stay: Payer: Medicare HMO | Attending: Hematology and Oncology

## 2019-12-13 ENCOUNTER — Other Ambulatory Visit: Payer: Self-pay | Admitting: Hematology and Oncology

## 2019-12-13 VITALS — BP 151/79 | HR 83 | Temp 96.0°F | Resp 18

## 2019-12-13 DIAGNOSIS — C7802 Secondary malignant neoplasm of left lung: Secondary | ICD-10-CM | POA: Diagnosis present

## 2019-12-13 DIAGNOSIS — E538 Deficiency of other specified B group vitamins: Secondary | ICD-10-CM | POA: Diagnosis present

## 2019-12-13 DIAGNOSIS — E876 Hypokalemia: Secondary | ICD-10-CM

## 2019-12-13 MED ORDER — IOHEXOL 300 MG/ML  SOLN
75.0000 mL | Freq: Once | INTRAMUSCULAR | Status: AC | PRN
  Administered 2019-12-13: 75 mL via INTRAVENOUS

## 2019-12-13 MED ORDER — CYANOCOBALAMIN 1000 MCG/ML IJ SOLN
1000.0000 ug | Freq: Once | INTRAMUSCULAR | Status: AC
  Administered 2019-12-13: 1000 ug via INTRAMUSCULAR

## 2019-12-13 NOTE — Patient Instructions (Signed)

## 2019-12-14 ENCOUNTER — Other Ambulatory Visit: Payer: Self-pay | Admitting: Hematology and Oncology

## 2019-12-14 ENCOUNTER — Telehealth: Payer: Self-pay

## 2019-12-14 DIAGNOSIS — C20 Malignant neoplasm of rectum: Secondary | ICD-10-CM

## 2019-12-14 NOTE — Telephone Encounter (Signed)
error 

## 2019-12-16 ENCOUNTER — Other Ambulatory Visit: Payer: Medicare HMO

## 2019-12-16 ENCOUNTER — Inpatient Hospital Stay: Payer: Medicare HMO

## 2019-12-16 NOTE — Progress Notes (Signed)
Tumor Board Documentation  Francena I Bissette was presented by Dr Rogue Bussing at our Tumor Board on 12/16/2019, which included representatives from medical oncology, radiation oncology, navigation, pathology, radiology, surgical, surgical oncology, palliative care, research.  Lessa currently presents as a current patient, for discussion with history of the following treatments: surgical intervention(s), adjuvant chemotherapy(Ablation therapy to Liver).  Additionally, we reviewed previous medical and familial history, history of present illness, and recent lab results along with all available histopathologic and imaging studies. The tumor board considered available treatment options and made the following recommendations: Surgery Refer to Dr Genevive Bi  The following procedures/referrals were also placed: No orders of the defined types were placed in this encounter.   Clinical Trial Status: not discussed   Staging used: To be determined  National site-specific guidelines   were discussed with respect to the case.  Tumor board is a meeting of clinicians from various specialty areas who evaluate and discuss patients for whom a multidisciplinary approach is being considered. Final determinations in the plan of care are those of the provider(s). The responsibility for follow up of recommendations given during tumor board is that of the provider.   Today's extended care, comprehensive team conference, Jameyah was not present for the discussion and was not examined.   Multidisciplinary Tumor Board is a multidisciplinary case peer review process.  Decisions discussed in the Multidisciplinary Tumor Board reflect the opinions of the specialists present at the conference without having examined the patient.  Ultimately, treatment and diagnostic decisions rest with the primary provider(s) and the patient.

## 2019-12-17 ENCOUNTER — Other Ambulatory Visit: Payer: Self-pay | Admitting: Internal Medicine

## 2019-12-17 ENCOUNTER — Encounter: Payer: Self-pay | Admitting: Hematology and Oncology

## 2019-12-17 ENCOUNTER — Encounter: Payer: Self-pay | Admitting: Cardiothoracic Surgery

## 2019-12-17 ENCOUNTER — Other Ambulatory Visit: Payer: Self-pay

## 2019-12-17 ENCOUNTER — Ambulatory Visit (INDEPENDENT_AMBULATORY_CARE_PROVIDER_SITE_OTHER): Payer: Medicare HMO | Admitting: Cardiothoracic Surgery

## 2019-12-17 VITALS — BP 161/79 | Ht 67.0 in

## 2019-12-17 DIAGNOSIS — C7802 Secondary malignant neoplasm of left lung: Secondary | ICD-10-CM | POA: Diagnosis not present

## 2019-12-17 DIAGNOSIS — C20 Malignant neoplasm of rectum: Secondary | ICD-10-CM | POA: Diagnosis not present

## 2019-12-17 NOTE — Progress Notes (Signed)
The patient c/o lower back pain ( pain level 3). The patient name and DOB has been verified by phone today.

## 2019-12-17 NOTE — Progress Notes (Signed)
Patient ID: Tamara Hall, female   DOB: 1956-09-16, 64 y.o.   MRN: [id number]  Chief Complaint  Patient presents with  . New Patient (Initial Visit)    Lung Cancer    Referred By Dr. Nolon Stalls Reason for Referral left lower lobe mass  HPI Location, Quality, Duration, Severity, Timing, Context, Modifying Factors, Associated Signs and Symptoms.  Tamara Hall is a 64 y.o. female.  [name] has a long and complicated history.  And 2018 she was diagnosed with adenocarcinoma of the rectum.  She underwent preoperative radiation therapy and chemotherapy and then underwent surgical resection down at [facility] about 2 years ago.  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  At some point during her evaluation she was found to have a liver mass and a left lower lobe mass.  The biopsy of the liver mass and the lung mass confirmed metastatic colorectal carcinoma and she underwent radiofrequency ablation of her liver mass followed by surgical resection the thoracoscopy on her left lower lobe.  All the details of that surgical procedure not immediately available to me but the patient was followed for 2 years at [facility] and had rising CEA levels and was told to undergo chemotherapy again.  At that point she decided to come back to the [place] area and was recently seen by Dr. Nolon Stalls who extensively evaluated the patient and found that the left lower lobe mass had recurred at the staple line and there is additionally another small nodule present in the left lower lobe both consistent with recurrent adenocarcinoma of colorectal origin.  She states that she has not had a biopsy of this area.  We did present her case in our multidisciplinary thoracic oncology board meeting and it was felt that the lesion was not amenable to percutaneous or endoscopic procedures.  She has not had pulmonary function  test done.  She did smoke for many years but quit at the time of her surgical resection 2 years ago.  She states that she had a prolonged hospital stay at [facility] and since then has been left with significant lower back problems.  Whether this was related to the surgery or the radiation therapy she currently sees a pain management specialist in [facility].  She is able to walk with a walker.  She is unable to walk up and down steps unless she has a handrail to hold onto.   Past Medical History:  Diagnosis Date  . Cancer ([place])    colon, with mets to liver and left lung  . Hypertension   . Pneumonia    20+ years ago    Past Surgical History:  Procedure Laterality Date  . ABDOMINAL HYSTERECTOMY    . BREAST SURGERY    . COLON SURGERY  10/2017   [facility]  . COLONOSCOPY WITH PROPOFOL N/A 01/14/2017   Procedure: COLONOSCOPY WITH PROPOFOL;  Surgeon: Lucilla Lame, MD;  Location: [facility] ENDOSCOPY;  Service: Endoscopy;  Laterality: N/A;  . ELBOW SURGERY Right    Has had 4 surgeries on right elbow.  Marland Kitchen HAND SURGERY Right 2008  . JOINT REPLACEMENT    . LIVER BIOPSY  10/2017  . lung wedge resection  10/2017  . PORTA CATH INSERTION N/A 01/29/2017   Procedure: Glori Luis Cath Insertion;  Surgeon: Algernon Huxley, MD;  Location: [facility];  Service: Cardiovascular;  Laterality: N/A;  .  TONSILLECTOMY      Family History  Problem Relation Age of Onset  . Lung cancer Mother 76  . Esophageal cancer Father 60  . Brain cancer Maternal Grandmother     Social History Social History   Tobacco Use  . Smoking status: Former Smoker    Packs/day: 2.00    Years: 40.00    Pack years: 80.00    Types: Cigarettes    Quit date: 10/07/2017    Years since quitting: 2.1  . Smokeless tobacco: Never Used  Substance Use Topics  . Alcohol use: No  . Drug use: No    No Known Allergies  Current Outpatient Medications  Medication Sig Dispense Refill  . amLODipine (NORVASC) 5 MG tablet Take 5 mg by mouth daily.    . baclofen  (LIORESAL) 10 MG tablet Take 10 mg by mouth 3 (three) times daily.    . folic acid (FOLVITE) 1 MG tablet Take 1 tablet (1 mg total) by mouth daily. 30 tablet 3  . gabapentin (NEURONTIN) 300 MG capsule TAKE 1 CAPSULE BY MOUTH THREE TIMES A DAY (Patient taking differently: Take 1,200 mg by mouth 3 (three) times daily. ) 90 capsule 0  . gabapentin (NEURONTIN) 300 MG capsule Take 1,200 mg by mouth 3 (three) times daily.     . hydrochlorothiazide (HYDRODIURIL) 25 MG tablet Take 25 mg by mouth daily.    Marland Kitchen HYDROcodone-acetaminophen (NORCO/VICODIN) 5-325 MG tablet Take 1 tablet by mouth every 6 (six) hours as needed for moderate pain. 15 tablet 0  . KLOR-CON M10 10 MEQ tablet TAKE 1 TABLET (10 MEQ TOTAL) BY MOUTH 2 (TWO) TIMES DAILY. 30 tablet 0  . LORazepam (ATIVAN) 0.5 MG tablet Take one tablet (0.5 mg) by mouth 30 minutes prior to the procedure.  Patient must have a driver. 2 tablet 0  . nortriptyline (PAMELOR) 10 MG capsule Take 10 mg by mouth at bedtime.    . ondansetron (ZOFRAN ODT) 8 MG disintegrating tablet Take 1 tablet (8 mg total) by mouth every 8 (eight) hours as needed for nausea or vomiting. 20 tablet 1   No current facility-administered medications for this visit.      Review of Systems A complete review of systems was asked and was negative except for the following positive findings loss of sleep, fatigue, history of rectal bleeding.  Blood pressure (!) 161/79, height 5\' 7"  (1.702 m).  Physical Exam CONSTITUTIONAL:  Pleasant, well-developed, well-nourished, and in no acute distress. EYES: Pupils equal and reactive to light, Sclera non-icteric EARS, NOSE, MOUTH AND THROAT:  The oropharynx was clear.  Dentition is good repair.  Oral mucosa pink and moist. LYMPH NODES:  Lymph nodes in the neck and axillae were normal RESPIRATORY:  Lungs were clear.  Normal respiratory effort without pathologic use of accessory muscles of respiration CARDIOVASCULAR: Heart was regular with short  systolic murmur murmurs.  There were no carotid bruits. GI: The abdomen was soft, nontender, and nondistended. There were no palpable masses. There was no hepatosplenomegaly. There were normal bowel sounds in all quadrants.  There are multiple surgical scars on the abdomen.  There is a functioning stoma in the left lower quadrant. GU:  Rectal deferred.   MUSCULOSKELETAL:  Normal muscle strength and tone.  No clubbing or cyanosis.   SKIN:  There were no pathologic skin lesions.  There were no nodules on palpation. NEUROLOGIC:  Sensation is normal.  Cranial nerves are grossly intact. PSYCH:  Oriented to person, place and time.  Mood  and affect are normal.  Data Reviewed Multiple chest x-rays CT scans PET scans  I have personally reviewed the patient's imaging, laboratory findings and medical records.    Assessment    I have independently reviewed her most recent CT scan.  There is a recurrence at the staple line in the left lower lobe along with a new left lower lobe nodule measuring about 6 mm.  I believe that both are likely related to her metastatic adenocarcinoma of rectal origin.    Plan    We will check a complete set of pulmonary function test today.  I will review her case with the surgeons at [facility] to see if she would be a candidate for surgical resection.       Nestor Lewandowsky, MD 12/17/2019, 9:42 AM

## 2019-12-17 NOTE — Patient Instructions (Addendum)
We will get you set up for Pulmonary function studies. We will call you about this appointment.  We will speak with the surgeons in [facility] about your case. We will call you about this and let you know your next steps.

## 2019-12-18 NOTE — Progress Notes (Signed)
[facility]  [address] [place], [place] [address] Phone: [phone]  Fax: [phone]   Clinic Day:  12/20/2019  Referring physician: Georga Kaufmann, NP  Chief Complaint: Tamara Hall is a 64 y.o. 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   HPI: The patient was last seen in the medical oncology clinic on 12/03/2019. At that time, she denied any abdominal pain. She denied any respiratory symptoms. Sodium was 134. Vitamin B12 was 223 (low) and folate 5.8 (low). We planned for a chest CT to assess resection margin. Patient was advised to being folic acid 1 mg a day. She requested B12 injections in [place].  She continued oral potassium 10 meq TID.  She received B12 on 12/13/2019.  Chest CT on 12/13/2019 revealed enlarging partially calcified lesion in the medial aspect of the left lower lobe most compatible with a pulmonary metastasis given the hypermetabolism on recent PET-CT. There were several other tiny pulmonary nodules measuring 6 mm or less in size, some of which were new or increased in size compared to prior examinations. Continued attention on follow-up studies was recommended.  There was aortic atherosclerosis, three vessel coronary artery disease, and calcifications of the aortic valve. Echocardiographic correlation for evaluation of potential valvular dysfunction may be warranted if clinically indicated. There was severe hepatic steatosis and stable appearance of ablation defect in segment 4A of the liver. There was old granulomatous disease redemonstrated, as above.  Tumor board recommended surgery and a referral to Dr. Genevive Bi on 12/16/2019.  Lesions were not amendable to percutaneous or endoscopic procedures.  She had an initial visit with Dr. Genevive Bi on 12/17/2019.  She was noted to have  recurrence at the staple line in the left lower lobe along with a new 6 mm LLL nodule.  Both were felt related to metastatic adenocarcinoma of rectal origin.  Pulmonary function testing was to be performed.  Her case was to be reviewed with the surgeons at [facility] to confirm candidacy for surgical resection.  Dr Rogue Bussing spoke with the patient after tumor board and discussion with Dr Faith Rogue.  The two nodules present in the left lower lobe could be addressed by left lobectomy.  During the interim, she felt "fair to [place]". She notes Dr. Genevive Bi "broker her heart" last week. He noted that half her left lung would need to be removed. He noted it would be a while before he could do any kind of surgery. I suggested a pulmonary function test and follow-up with Dr Faith Rogue and the Surgeons at [facility].   Symptomatically, she reports lower back pain (3/10) today. She is taking oral potassium BID. She receives monthly B12 injections in [place], [place].    Past Medical History:  Diagnosis Date  . Cancer ([place])    colon, with mets to liver and left lung  . Hypertension   . Pneumonia    20+ years ago    Past Surgical History:  Procedure Laterality Date  . ABDOMINAL HYSTERECTOMY    . BREAST SURGERY    . COLON SURGERY  10/2017   [facility]  . COLONOSCOPY WITH PROPOFOL N/A 01/14/2017   Procedure: COLONOSCOPY WITH PROPOFOL;  Surgeon: Lucilla Lame, MD;  Location: [facility] ENDOSCOPY;  Service: Endoscopy;  Laterality: N/A;  . ELBOW SURGERY Right    Has had 4 surgeries on right elbow.  Marland Kitchen  HAND SURGERY Right 2008  . JOINT REPLACEMENT    . LIVER BIOPSY  10/2017  . lung wedge resection  10/2017  . PORTA CATH INSERTION N/A 01/29/2017   Procedure: Glori Luis Cath Insertion;  Surgeon: Algernon Huxley, MD;  Location: [facility];  Service: Cardiovascular;  Laterality: N/A;  . TONSILLECTOMY      Family History  Problem Relation Age of Onset  . Lung cancer Mother 28  . Esophageal cancer Father 63  . Brain cancer  Maternal Grandmother     Social History:  reports that she quit smoking about 2 years ago. Her smoking use included cigarettes. She has a 80.00 pack-year smoking history. She has never used smokeless tobacco. She reports that she does not drink alcohol or use drugs. She was smoking 1/2 packs/day. She stopped smoking 10/24/2017. She lives in [place]. She has 3 children (ages 67, 29, and 69). She smoked 2 packs a day. She started smoking at age 40. Stopped smoking on 10/24/2017. 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 [name] is her significant other. The patient is alone today.  Allergies: No Known Allergies  Current Medications: Current Outpatient Medications  Medication Sig Dispense Refill  . amLODipine (NORVASC) 5 MG tablet Take 5 mg by mouth daily.    . baclofen (LIORESAL) 10 MG tablet Take 10 mg by mouth 3 (three) times daily.    . folic acid (FOLVITE) 1 MG tablet Take 1 tablet (1 mg total) by mouth daily. 30 tablet 3  . gabapentin (NEURONTIN) 300 MG capsule TAKE 1 CAPSULE BY MOUTH THREE TIMES A DAY (Patient taking differently: Take 1,200 mg by mouth 3 (three) times daily. ) 90 capsule 0  . gabapentin (NEURONTIN) 300 MG capsule Take 1,200 mg by mouth 3 (three) times daily.     . hydrochlorothiazide (HYDRODIURIL) 25 MG tablet Take 25 mg by mouth daily.    Marland Kitchen HYDROcodone-acetaminophen (NORCO/VICODIN) 5-325 MG tablet Take 1 tablet by mouth every 6 (six) hours as needed for moderate pain. 15 tablet 0  . KLOR-CON M10 10 MEQ tablet TAKE 1 TABLET (10 MEQ TOTAL) BY MOUTH 2 (TWO) TIMES DAILY. 30 tablet 0  . LORazepam (ATIVAN) 0.5 MG tablet Take one tablet (0.5 mg) by mouth 30 minutes prior to the procedure.  Patient must have a driver. 2 tablet 0  . nortriptyline (PAMELOR) 10 MG capsule Take 10 mg by mouth at bedtime.    . ondansetron (ZOFRAN ODT) 8 MG disintegrating tablet Take 1 tablet (8 mg total) by mouth every 8 (eight) hours as needed  for nausea or vomiting. 20 tablet 1   No current facility-administered medications for this visit.    Review of Systems  Constitutional: Negative for chills, diaphoresis, fever, malaise/fatigue and weight loss.       Feels "fair to [place]". Back pain effects ADLs.  She relies on rolling walker.  HENT: Negative.  Negative for congestion, ear pain, hearing loss, nosebleeds, sinus pain and sore throat.        Dental work planned on 4 teeth (fillings and crowns).  Eyes: Negative.  Negative for blurred vision, double vision and photophobia.  Respiratory: Negative.  Negative for cough, hemoptysis, sputum production and shortness of breath.   Cardiovascular: Negative.  Negative for chest pain, palpitations and leg swelling.  Gastrointestinal: Negative for abdominal pain (intermittent pain- improved), blood in stool, constipation, diarrhea, heartburn (on PPI), melena, nausea and vomiting.  Colostomy s/p APR.  Normal bowels.  Genitourinary: Negative.  Negative for dysuria, frequency, hematuria and urgency.  Musculoskeletal: Positive for back pain (lower back- 3 out of 10). Negative for falls, joint pain, myalgias and neck pain.  Skin: Negative.  Negative for itching and rash.  Neurological: Negative.  Negative for dizziness, tingling, sensory change, speech change, focal weakness, weakness and headaches.  Endo/Heme/Allergies: Negative.  Does not bruise/bleed easily.  Psychiatric/Behavioral: Negative for depression and memory loss. The patient is not nervous/anxious and does not have insomnia.   All other systems reviewed and are negative.  Performance status (ECOG):  2  Vitals Blood pressure (!) 147/81, pulse 90, temperature (!) 96.6 F (35.9 C), temperature source Tympanic, resp. rate 18, SpO2 97 %.   Physical Exam  Constitutional: She is oriented to person, place, and time. She appears well-developed and well-nourished. No distress.  She has a rolling walker by her side.  HENT:  Head:  Normocephalic and atraumatic.  Long blonde/graying hair.  Mask.  Eyes: Conjunctivae and EOM are normal. No scleral icterus.  Blue eyes.  Neurological: She is alert and oriented to person, place, and time.  Skin: She is not diaphoretic. No pallor.  Psychiatric: She has a normal mood and affect. Her behavior is normal. Judgment and thought content normal.  Nursing note and vitals reviewed.   Imaging studies: 01/13/2017:Abdomen and pelvic CTrevealed irregular thickening of the rectum suspicious for carcinoma. There was a 1.6 cm hypoattenuating lesion in the right hepatic lobe worrisome for metastatic disease. 01/15/2017:Chest CT revealed a 9 mm noncalcified left lower lobe pulmonary nodule (new). 01/16/2017:Pelvic MRIrevealed rectal adenocarcinoma T3N1. There was extension beyond the muscularis propria (17 mm). There were mesorectal nodes >= 5 mm. There was adenopathy on the left side of the mesorectum, including a 9 mm lesion. The distance from the tumor to the anal sphincter was 8 mm. 01/27/2017:PET scanrevealed intense metabolic activity associated rectal mass consists with primary rectal carcinoma. There was a hypermetabolic metastatic lesion to the central LEFT hepatic lobe. There was metabolic activity associated with small LEFT lower lobe nodule which was most consistent with pulmonary metastasis. 01/31/2017:Liver MRIrevealed a 2.6 x 2.7 x 2.1 cm lesion in segment VIII of the liver near the junction with segment IVa c/w a metastatic lesion. There was a 1.1 x 0.8 cm suspected metastatic lesion peripherally in segment VI of the liver.  05/16/2017:Abdomen and pelvic CTrevealed persistent but decreased asymmetric wall thickening in the rectum. The medial segment left liver lesion was smaller (2.7 x 2.6 cm to 1.9 x 1.6 cm). A tiny lesion seen in segment VI of the liver on previous MRI was not evident on today's CT scan. The left lower lobe pulmonary nodule was smaller (9  mm to 6 mm). There was no new or progressive findings in the abdomen and pelvis. 08/19/2017:Chest CTwith contrast on 08/19/2017 revealed left lower lobe 6 mm solid pulmonary nodule, decreased since 01/15/2017 chest CT. Stable 7 mm right lower lobe pulmonary nodule. There was no new or progressive metastatic disease in the chest.  09/03/2018: Pelvic [facility] at [facility] demonstrated a partial response of the primary tumor and extramural disease. Post treatment category ymrT T3b. There was an overall decrease in size in the previously seen perirectal node. Hypointense changes at the site of prior tumor focally contacedt buts does not definitely invade the LEFT levator ani. Tumor contacted, but does not definitely invade the internal anal sphincter. No evidence of residual or recurrent hepatic disease following microwave ablation. 02/17/2018:Abdomen  and pelvic CTrevealed evolution of the ablation zone in segment 4A, now measuring 3.7 x 3.1 cm, decreased.There was no findings to suggest viable tumor.She is s/pabdominoperineal resection with left lower quadrant colostomy. There was no evidence of new/progressive metastatic disease.She is s/pleft lower lobe pulmonary wedge resection, incompletely visualized. 03/04/2018:Chest CTrevealed a stable chest CT (compared to11/13/2018). The index right lower lobe lung nodule measured7 mm (unchanged). There was no new or progressive nodularity identified. There was continued decrease in size of ablation defect within segment 4a of the liver. 08/13/2019:  [facility]dominaland [facility] evidence of parastomal hernia with small bowel as well as multiple small bowel loops whichwere dilated.There was hepatic steatosis and post-treatment changes in the pelvis with persistent presacral soft tissue. There was a 3.5 cm infrarenal abdominal aortic aneurysm. 09/09/2019:Chest CT at [facility] no intrathoracic metastasis and no new or enlarging pulmonary  nodules. 09/09/2019:Abdomen and pelvis CT at [facility] multiple subcentimeter hypodensities in the dome of the liver, which appeared new/more conspicuous from prior CT. There were not seen on the prior MRI. Further evaluation with MRI was recommended. CT evaluationwas limited due to background fatty liver and the heterogeneity/these lesions could have been secondary to fatty liver. There was unchanged soft tissue stranding and nodularity in the pelvis, with a few prominent more nodular areas. There were a few more prominent indeterminateareasand short-term follow up was recommended to ensure stability and exclude peritoneal disease. Further evaluation with MRI could have been helpful. 11/12/2019: Liver and pelvis MRIrevealedno findings to explain the patient's history of rising CEA. There was irregular presacral soft tissue with apparent scarring noted in the left paracentral pelviswas similar to prior studies and presumably treatment related. PET-CT could be used, as clinically warranted to exclude hypermetabolic disease within thatregion. There was an ablation defect identified in segment IV with minimal irregular enhancement along the anterosuperior margin, indeterminate. Attention on follow-up recommended.There was a tiny cyst identified in the dome of the right liver without other findings to suggest metastatic disease in the hepatic parenchyma. 11/29/2019:  PET scan revealed 1.6 x 1.5 cm nodular thickening at the resection margin (SUV 11.3) in the LEFT lower lobe. There was no evidence of hepatic metastasis. There was no evidence of local recurrence in pelvis.  There was insufficiency fracture in the LEFT sacral ala presumably related to radiation treatment.   No visits with results within 3 Day(s) from this visit.  Latest known visit with results is:  Office Visit on 12/03/2019  Component Date Value Ref Range Status  . [facility] test code 12/03/2019 [id number]   Final  . [facility] test name  12/03/2019 ALPHA GAL IGE   Final   Performed at [facility], [address] [place], [place] [address]  . Misc [facility] result 12/03/2019 COMMENT   Final   Comment: (NOTE) Test Ordered: [id number] Alpha Gal IgE Alpha Gal IgE*                 <0.10            kU/L     NEWXW     Reference Range: <0.10                                 Previous reports (JACI [id number]) have demonstrated that patients with IgE antibodies to galactose-a-1,3-galactose are at risk for delayed anaphylaxis, angioedema, or urticaria following consumption of beef, pork, or lamb. *This test was developed and its performance  characteristics determined by [employer]. It has not been cleared or approved by the U.S. Food and Drug Administration. Performed At: [facility] [place], [place] [id number] Rush Farmer MD [id number] Performed At: [facility] [place], [place] [id number] Colbert Coyer PhD [id number]   . Folate 12/03/2019 5.8* >5.9 ng/mL Final   Performed at [facility], [address] [place], [place] [address]  . Vitamin B-12 12/03/2019 223  180 - 914 pg/mL Final   Comment: (NOTE) This assay is not validated for testing neonatal or myeloproliferative syndrome specimens for Vitamin B12 levels. Performed at [facility], [place] [address] [place], [place] [address]   . Sodium 12/03/2019 134* 135 - 145 mmol/L Final  . Potassium 12/03/2019 3.5  3.5 - 5.1 mmol/L Final  . Chloride 12/03/2019 95* 98 - 111 mmol/L Final  . CO2 12/03/2019 27  22 - 32 mmol/L Final  . Glucose, Bld 12/03/2019 93  70 - 99 mg/dL Final   Glucose reference range applies only to samples taken after fasting for at least 8 hours.  . BUN 12/03/2019 8  8 - 23 mg/dL Final  . Creatinine, Ser 12/03/2019 0.66  0.44 - 1.00 mg/dL Final  . Calcium 12/03/2019 9.3  8.9 - 10.3 mg/dL Final  . GFR calc non Af Amer 12/03/2019 >60  >60 mL/min Final  . GFR  calc Af Amer 12/03/2019 >60  >60 mL/min Final  . Anion gap 12/03/2019 12  5 - 15 Final   Performed at [facility], [address], [place], [place] [address]    Assessment:  AMIYRAH LAMERE is a 64 y.o. female with clinical stage T3N1M1rectal cancer. She presented with a 9 month history of rectal bleeding. Colonoscopyon 01/14/2017 revealed afrond-like/villous non-obstructing large mass in the rectum. The mass was non-circumferential. Biopsies revealed high grade dysplasia within an adenoma (? sampling error). CEAwas 31.6 on 01/15/2017.  PET scanon 01/27/2017 revealed intense metabolic activity associated rectal mass consists with primary rectal carcinoma. There was a hypermetabolic metastatic lesion to the central LEFT hepatic lobe. There was metabolic activity associated with small LEFT lower lobe nodule which was most consistent with pulmonary metastasis.  CT guided liver biopsyat [facility] on 02/07/2017 revealed malignant cells c/w metastatic colorectal adenocarcinoma. Tumor was sent for MMR/MSI, KRAS/NRAS, and BRAF. Alpha gal was < 0.10 on 12/03/2019.  CEAhas been followed: 31.6 on 01/15/2017, 38.1 on 02/18/2017, 25.1 on 03/18/2017, 11.8 on 04/08/2017, 9.2 on 04/22/2017, 9.5 on 05/20/2017, 9.6 on 06/19/2017, 3.7 on 02/18/2018,5.8 on 06/26/2018, 10.5 on 11/20/2018, 19.5 on 03/26/2019, 34.0 on 07/21/2019, 46.4 on 09/09/2019, and 54.4 on 10/28/2019.  She received 2 cycles of FOLFOX (02/18/2017 - 03/04/2017). She developed transient cold induced neuropathy. She developed wheezing and hypoxia with cycle #2 felt secondary to oxaliplatin. She received 4 cycles ofFOLFIRI(03/24/2017 - 04/22/2017) with Neulasta support. Diarrhea was controlled with Imodium.  She underwent CT guided microwave ablation of the liver lesionon 06/06/2017.   She began radiationon 06/18/2017. She received daily Xelodawith radiation. Radiation and Xeloda completed on 08/15/2017.  She  underwentAPR with descending colostomy and VRAM flapon 10/24/2017. She underwent a VATS wedge resectionto her LEFT lung. Pathology from the lung demonstrated a 1 cm metastatic intestinal type adenocarcinoma c/w metastasis from patient's known colorectal carcinoma.   Pathology from the rectal resection revealed a 2.1 cm invasive moderately differentiated adenocarcinoma arising in a tubular adenoma with transmural invasion focally into the perirectal soft tissue.  There was  no lymphovascular or perineural invasion.  Margins were negative.  Zero of 20 lymph nodes were positive.  Final diagnosiswas [id number] rectal adenocarcinoma with metastasis to left lower lobe of the lung.  She has had post-operative pain and ostomy ischemia. She underwent surgical ostomy revisionon 11/28/2017. Pathologyshowed focal mucosal erosion and fat necrosis c/w ischemia. There was extensive underlying fat necrosis. No dysplasia or carcinoma was identified. She was treated with oral Augmentin and wound packing. She has been treated with oxycodone 5 mg po q 4 hrs prn, gabapentin 900 mg TID. She was treated with Bactrim on 01/18/2018. She was referred to local pain management clinic on 01/27/2018.  PET scan on 11/29/2019 showed local recurrence of hypermetabolic nodular lung malignancy at the resection margin  (1.6 x 1.5 cm; SUV 11.3) in the LEFT lower lobe.  There was no evidence of hepatic metastasis. There was no evidence of local recurrence in pelvis.  There was insufficiency fracture in the LEFT sacral ala presumably related to radiation treatment.   Chest CT on 12/13/2019 revealed an enlarging partially calcified 2.3 x 1.2 cm lesion in the medial aspect of the left lower lobe most c/w a pulmonary metastasis given the hypermetabolism on recent PET-CT. There were several other tiny pulmonary nodules measuring 6 mm or less in size, some of which were new or increased in size compared to prior examinations.  Colonoscopy  at [facility] 09/13/2019 did not appear malignant with no adenomatous (granulation tissue). There was diverticulosis in the descending colon, normal mucosa in the entire examined colon and the examined portion of the ileum. There was a 9 mmlipoma in the transverse colon anda4 mm polyp at the surgical stoma.   She has a normocytic anemia.She has an 80 pack yearsmoking history. She is smoking.  [facility] for a smallbowel obstruction. She had severe abdominal pain with associated nausea and vomiting. Abdominal and pelvicCT revealedmultiple small bowel loops whichwere dilated.Obstruction resolved spontaneously.  Symptomatically, she denies any respiratory symptoms. She has back pain.   Plan: 1.   Stage IV rectal carcinoma Patient presented with stage IVrectal cancer.  Patientreceived6cycles of FOLFOX(02/18/2017 -04/22/2017).  She underwent CT guided microwave ablation of the liver lesionon 06/06/2017.  She receivedradiationand daily Xeloda (06/18/2017-08/15/2017). She underwentAPR with descending colostomy and VRAM flapon 10/24/2017.  She underwent a VATS wedge resectionto her LEFT lung. CEA has increased from 3.7 on 05/15/2019to54.4 on 10/28/2019. [facility]2019 revealed multiple subcentimeter hypodensities in the liver concerning for metastasis.             Liver and pelvis MRI at [facility] on 11/12/2019 revealed irregular soft tissue in the left pelvis (unclear significance). PET scan on 11/29/2019 revealed recurrent disease at the resection margin.              Chest CT recommended to assess resection margin.  Chest CT on 12/13/2019 was personally reviewed.  Agree with radiology interpretation.   There was an enlarging partially calcified 2.3 x 1.2 cm lesion in the medial aspect  of the left lower lobe c/w a pulmonary metastasis.    There were several other tiny pulmonary nodules measuring 6 mm or less in size.  Discuss conversation with Dr Genevive Bi and consideration of left lower lobe lobectomy.   Planned follow-up with [facility] cardiothoracic surgeons.              Follow-up Nex[employer] sequencing on biopsy.  If tissue unavailable, send tissue for testing from [facility] case [phone]. 2. Recurrent abdominal pain She notes intermittent pain likely secondary torecurrent partial bowel obstruction. Colonoscopy revealed no lesions. Suspect adhesions causing pain. Liver and pelvis MRI on 11/12/2019 reveal no obvious culprit.             PET scan revealed no intra-abdominal metastatic disease.  Continue to monitor. 3. Left iliac pain Etiology remains unclear. Pelvic MRIrevealed no evidence of metastasis. PET scan revealed no activity in soft tissue in left pelvis. Patient is followed in the pain clinic. 4. B12deficiency and folate deficiency B12 was 223 (low) on 12/03/2019. Folate was 5.8 (low) on 12/03/2019.  Patient began folic acid. She restarted B12 injections on 12/13/2019.   Continue B12 injections monthly in [place].  Check folate level in 1 month.  5.Hypokalemia Potassium 3.5 today. She remains on potassium supplementation (10 meq BID-TID). 6.   Patient has an appt with a Dr Roxan Hockey as well as PFTs on 01/04/2020. Patient needs directions or further contact information for both. 7.   Continue B12 monthly x 6 (appts in [place]). 8.   Patient to call for return appt.  I discussed the assessment and treatment plan with the patient.  The patient was provided an opportunity to ask questions and all were answered.  The patient agreed with the plan and demonstrated an  understanding of the instructions.  The patient was advised to call back if the symptoms worsen or if the condition fails to improve as anticipated.   Lequita Asal, MD, PhD    12/20/2019, 2:45 PM  I, Selena Batten, am acting as scribe for [employer]. Mike Gip, MD, PhD.  I, Lawrie Tunks C. Mike Gip, MD, have reviewed the above documentation for accuracy and completeness, and I agree with the above.

## 2019-12-19 ENCOUNTER — Telehealth: Payer: Self-pay | Admitting: Internal Medicine

## 2019-12-19 NOTE — Telephone Encounter (Signed)
On 3/10-imaging reviewed at the tumor conference in the absence of Dr. Mike Gip.    On review of CT scan 2 nodules are present in the left lower lobe.  As per the discussion/Dr. Philemon Kingdom 2 lesions could be addressed left lobectomy.   Defer to Dr. Mike Gip for for further management.

## 2019-12-20 ENCOUNTER — Other Ambulatory Visit: Payer: Self-pay

## 2019-12-20 ENCOUNTER — Encounter: Payer: Self-pay | Admitting: Hematology and Oncology

## 2019-12-20 ENCOUNTER — Inpatient Hospital Stay (HOSPITAL_BASED_OUTPATIENT_CLINIC_OR_DEPARTMENT_OTHER): Payer: Medicare HMO | Admitting: Hematology and Oncology

## 2019-12-20 VITALS — BP 147/81 | HR 90 | Temp 96.6°F | Resp 18

## 2019-12-20 DIAGNOSIS — C7802 Secondary malignant neoplasm of left lung: Secondary | ICD-10-CM

## 2019-12-20 DIAGNOSIS — C20 Malignant neoplasm of rectum: Secondary | ICD-10-CM

## 2019-12-20 DIAGNOSIS — E538 Deficiency of other specified B group vitamins: Secondary | ICD-10-CM | POA: Diagnosis not present

## 2019-12-20 DIAGNOSIS — E876 Hypokalemia: Secondary | ICD-10-CM | POA: Diagnosis not present

## 2019-12-20 DIAGNOSIS — Z7189 Other specified counseling: Secondary | ICD-10-CM

## 2019-12-20 NOTE — Progress Notes (Signed)
Referral has been placed to Cardiothoracic surgery, Dr Roxan Hockey, in [place].

## 2019-12-22 ENCOUNTER — Encounter: Payer: Medicare HMO | Admitting: Thoracic Surgery (Cardiothoracic Vascular Surgery)

## 2019-12-22 ENCOUNTER — Telehealth: Payer: Self-pay

## 2019-12-22 NOTE — Telephone Encounter (Signed)
Call to patient to go over Pulmonary function test. The patient is scheduled for PFT testing on Tuesday March 30th. She will have a Covid-19 test done at the [facility] on Monday March 29th between 8-10 am.   For the PFT test she will arrive at [facility] and go in through the [place] entrance at 12 pm on Tuesday March 30th.

## 2020-01-03 ENCOUNTER — Other Ambulatory Visit: Admission: RE | Admit: 2020-01-03 | Payer: Medicare HMO | Source: Ambulatory Visit

## 2020-01-04 ENCOUNTER — Encounter: Payer: Medicare HMO | Admitting: Thoracic Surgery (Cardiothoracic Vascular Surgery)

## 2020-01-04 ENCOUNTER — Ambulatory Visit: Payer: Medicare HMO

## 2020-01-07 ENCOUNTER — Inpatient Hospital Stay: Payer: Medicare HMO | Attending: Hematology and Oncology

## 2020-01-10 ENCOUNTER — Telehealth: Payer: Self-pay

## 2020-01-10 NOTE — Telephone Encounter (Signed)
-----   Message from Secundino Ginger sent at 01/10/2020 10:08 AM EDT ----- Regarding: appts/ surgery Contact: [phone] This patient called and said her husband is at [facility] after a series of 4 Heart attacks ar [facility]. He was transferred and is on a vent. She was crying so hard and is very upset. She said to tell you that she has decided not to have surgery now after seeing what her husband went through. She has missed her appt in the last 2 weeks because of what has been going on. 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

## 2020-01-25 ENCOUNTER — Encounter: Payer: Self-pay | Admitting: Hematology and Oncology

## 2020-02-04 ENCOUNTER — Inpatient Hospital Stay: Payer: Medicare HMO

## 2020-02-09 ENCOUNTER — Other Ambulatory Visit: Payer: Self-pay

## 2020-02-09 NOTE — Progress Notes (Addendum)
[facility]  [address], [address] [place], [place] [address] Phone: [phone]  Fax: [phone]   Clinic Day:  02/10/2020  Referring physician: Loni Muse, MD  Chief Complaint: Tamara Hall is a 64 y.o. female with stage IV rectal cancer s/p microwave hepatic ablation, APR with colostomy, and VATS wedge resection who is seen for reassessment.   HPI: The patient was last seen in the medical oncology clinic on 12/20/2019. At that time, she denied any respiratory symptoms.  She had back pain requiring a rolling walker.  Chest CT on 12/13/2019 revealed an enlarging partially calcified 2.3 cm lesion at the suture line in the left lower lobe as well as several other tiny pulmonary nodules (<= 6 mm).  She had been seen by Dr Genevive Bi who discussed left lower lobe lobectomy.  She had been referred to Dr Roxan Hockey at [facility] in [place].  An appointment with PFTs was scheduled on 01/04/2020.  She has known B12 and folate deficiency.  She restarted monthly B12 injections on 12/13/2019.  She was to continue folic acid 1 mg a day.    During the interim, she stated "I'm here". She has yet to see Dr. Koleen Nimrod. Patient states that she has missed a few appointments due to her husband being hospitalized for MI x3 and CVA.  Her husband has improved since being home for 2 weeks. As off today, her husband can perform ADLs; he is able to walk with a walker. She is cooking for him and he can feed himself. She notes feeing exhausted while caring her him.   She states that she has decided that she does not want lung surgery after seeing her husband on the ventilator in the hospital. She wants to start chemotherapy.  She understates that treatment for metastatic rectal cancer is palliative.  She comments that she still has dental work that needs to be completed;  She plans to reschedule with her dentist for later this month.   Over the 4 days, she has had abdominal spasms that  cause her nausea and vomiting; ondansetron has helped. She describes of pattern of abdominal pain and spasms that may last up to 4 days then resolves;  Episodes occur periodically.  She notes having 4-5 spasms today. Food does not result abdominal pain.  Laxatives help her bowels move, but it did not relieve the pain.  She has not seen her surgeon in some time.    She is not sleeping well because she helps her husband in the night. She has not had her B12 injections since her husband was in the hospital. She has been taking her folic acid. She has been off oral potassium x 2 months. She is eating cheerios to help regulate bowels. On occasion her stool is hard and/or watery. She notes having to change her bag completely at times. Her back pain is still the same. She does not plan to have the COVID-19 vaccine.    Past Medical History:  Diagnosis Date  . Cancer ([place])    colon, with mets to liver and left lung  . Hypertension   . Pneumonia    20+ years ago    Past Surgical History:  Procedure Laterality Date  . ABDOMINAL HYSTERECTOMY    . BREAST SURGERY    . COLON SURGERY  10/2017   [facility]  . COLONOSCOPY WITH PROPOFOL N/A 01/14/2017   Procedure: COLONOSCOPY WITH PROPOFOL;  Surgeon: Lucilla Lame, MD;  Location: [facility] ENDOSCOPY;  Service: Endoscopy;  Laterality: N/A;  . ELBOW SURGERY Right    Has had 4 surgeries on right elbow.  Marland Kitchen HAND SURGERY Right 2008  . JOINT REPLACEMENT    . LIVER BIOPSY  10/2017  . lung wedge resection  10/2017  . PORTA CATH INSERTION N/A 01/29/2017   Procedure: Glori Luis Cath Insertion;  Surgeon: Algernon Huxley, MD;  Location: [place] [facility];  Service: Cardiovascular;  Laterality: N/A;  . TONSILLECTOMY      Family History  Problem Relation Age of Onset  . Lung cancer Mother 21  . Esophageal cancer Father 77  . Brain cancer Maternal Grandmother     Social History:  reports that she quit smoking about 2 years ago. Her smoking use included cigarettes. She has a 80.00  pack-year smoking history. She has never used smokeless tobacco. She reports that she does not drink alcohol or use drugs. She was smoking 1/2 packs/day.She lives in [place]. She has 3 children (ages 27, 48, and 24). She smoked 2 packs a day. She started smoking at age 76. Stopped smoking on 10/24/2017. 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 [name] is her significant other. The patient is alone today.  Allergies: No Known Allergies  Current Medications: Current Outpatient Medications  Medication Sig Dispense Refill  . amLODipine (NORVASC) 5 MG tablet Take 5 mg by mouth daily.    . baclofen (LIORESAL) 10 MG tablet Take 10 mg by mouth 3 (three) times daily.    . folic acid (FOLVITE) 1 MG tablet Take 1 tablet (1 mg total) by mouth daily. 30 tablet 3  . gabapentin (NEURONTIN) 300 MG capsule Take 1,200 mg by mouth 3 (three) times daily.     . hydrochlorothiazide (HYDRODIURIL) 25 MG tablet Take 25 mg by mouth daily.    Marland Kitchen HYDROcodone-acetaminophen (NORCO/VICODIN) 5-325 MG tablet Take 1 tablet by mouth every 6 (six) hours as needed for moderate pain. 15 tablet 0  . nortriptyline (PAMELOR) 10 MG capsule Take 10 mg by mouth at bedtime.    . ondansetron (ZOFRAN ODT) 8 MG disintegrating tablet Take 1 tablet (8 mg total) by mouth every 8 (eight) hours as needed for nausea or vomiting. 20 tablet 1  . Ostomy Supplies (PREMIER DRAINABLE POUCH 64MM) Pouch MISC     . albuterol (VENTOLIN HFA) 108 (90 Base) MCG/ACT inhaler Inhale into the lungs.    . gabapentin (NEURONTIN) 300 MG capsule TAKE 1 CAPSULE BY MOUTH THREE TIMES A DAY (Patient not taking: No sig reported) 90 capsule 0  . LORazepam (ATIVAN) 0.5 MG tablet Take one tablet (0.5 mg) by mouth 30 minutes prior to the procedure.  Patient must have a driver. (Patient not taking: Reported on 02/09/2020) 2 tablet 0   No current facility-administered medications for this visit.    Review of Systems    Constitutional: Positive for malaise/fatigue. Negative for chills, diaphoresis, fever and weight loss.       "I'm here".  Relies on rolling walker.  HENT: Negative for congestion, ear discharge, ear pain, hearing loss, nosebleeds, sinus pain, sore throat and tinnitus.        Dental work partially complete.  Eyes: Negative for blurred vision.  Respiratory: Negative for cough, hemoptysis, sputum production and shortness of breath.   Cardiovascular: Negative for chest pain, palpitations and leg swelling.  Gastrointestinal: Positive for abdominal pain (intermittent labor-like pain occurring up to 4 days at a time), nausea and vomiting. Negative for  blood in stool, constipation (hard stool), diarrhea (watery), heartburn and melena.       Colostomy s/p APR.  Genitourinary: Negative for dysuria, frequency, hematuria and urgency.  Musculoskeletal: Positive for back pain (lower back- stable). Negative for joint pain, myalgias and neck pain.  Skin: Negative for itching and rash.  Neurological: Negative for dizziness, tingling, sensory change, weakness and headaches.  Endo/Heme/Allergies: Does not bruise/bleed easily.  Psychiatric/Behavioral: Negative for depression. The patient has insomnia (caring for husband). The patient is not nervous/anxious.   All other systems reviewed and are negative.   Performance status (ECOG): 1-2  Vitals Blood pressure (!) 153/69, pulse 90, temperature (!) 96.9 F (36.1 C), temperature source Tympanic, weight 192 lb 14.4 oz (87.5 kg), SpO2 98 %.   Physical Exam  Constitutional: She is oriented to person, place, and time. She appears well-developed and well-nourished. No distress.  She has a rolling walker by her side.  HENT:  Head: Normocephalic and atraumatic.  Mouth/Throat: Oropharynx is clear and moist. No oropharyngeal exudate.  Long blonde/gray hair pulled back. Mask.  Eyes: Pupils are equal, round, and reactive to light. Conjunctivae and EOM are normal. No  scleral icterus.  Blue eyes.  Cardiovascular: Normal rate, regular rhythm and normal heart sounds.  No murmur heard. Pulmonary/Chest: Effort normal and breath sounds normal. No respiratory distress. She has no wheezes. She has no rales. She exhibits no tenderness.  Abdominal: Soft. Bowel sounds are normal. She exhibits no distension and no mass. There is abdominal tenderness. There is no rebound and no guarding.  Musculoskeletal:        General: No edema. Normal range of motion.     Cervical back: Normal range of motion and neck supple.     Lumbar back: Tenderness (L4 and SI joint) present.  Lymphadenopathy:    She has no cervical adenopathy.    She has no axillary adenopathy.  Neurological: She is alert and oriented to person, place, and time.  Skin: Skin is warm and dry. She is not diaphoretic.  Psychiatric: She has a normal mood and affect. Her behavior is normal. Judgment and thought content normal.  Nursing note and vitals reviewed.   Imaging studies: 01/13/2017:Abdomen and pelvic CTrevealed irregular thickening of the rectum suspicious for carcinoma. There was a 1.6 cm hypoattenuating lesion in the right hepatic lobe worrisome for metastatic disease. 01/15/2017:Chest CT revealed a 9 mm noncalcified left lower lobe pulmonary nodule (new). 01/16/2017:Pelvic MRIrevealed rectal adenocarcinoma T3N1. There was extension beyond the muscularis propria (17 mm). There were mesorectal nodes >= 5 mm. There was adenopathy on the left side of the mesorectum, including a 9 mm lesion. The distance from the tumor to the anal sphincter was 8 mm. 01/27/2017:PET scanrevealed intense metabolic activity associated rectal mass consists with primary rectal carcinoma. There was a hypermetabolic metastatic lesion to the central LEFT hepatic lobe. There was metabolic activity associated with small LEFT lower lobe nodule which was most consistent with pulmonary metastasis. 01/31/2017:Liver  MRIrevealed a 2.6 x 2.7 x 2.1 cm lesion in segment VIII of the liver near the junction with segment IVa c/w a metastatic lesion. There was a 1.1 x 0.8 cm suspected metastatic lesion peripherally in segment VI of the liver.  05/16/2017:Abdomen and pelvic CTrevealed persistent but decreased asymmetric wall thickening in the rectum. The medial segment left liver lesion was smaller (2.7 x 2.6 cm to 1.9 x 1.6 cm). A tiny lesion seen in segment VI of the liver on previous MRI was not evident on  today's CT scan. The left lower lobe pulmonary nodule was smaller (9 mm to 6 mm). There was no new or progressive findings in the abdomen and pelvis. 08/19/2017:Chest CTwith contrast on 08/19/2017 revealed left lower lobe 6 mm solid pulmonary nodule, decreased since 01/15/2017 chest CT. Stable 7 mm right lower lobe pulmonary nodule. There was no new or progressive metastatic disease in the chest.  09/03/2018: Pelvic MRI at [facility] demonstrated a partial response of the primary tumor and extramural disease. Post treatment category ymrT T3b. There was an overall decrease in size in the previously seen perirectal node. Hypointense changes at the site of prior tumor focally contacedt buts does not definitely invade the LEFT levator ani. Tumor contacted, but does not definitely invade the internal anal sphincter. No evidence of residual or recurrent hepatic disease following microwave ablation. 02/17/2018:Abdomen and pelvic CTrevealed evolution of the ablation zone in segment 4A, now measuring 3.7 x 3.1 cm, decreased.There was no findings to suggest viable tumor.She is s/pabdominoperineal resection with left lower quadrant colostomy. There was no evidence of new/progressive metastatic disease.She is s/pleft lower lobe pulmonary wedge resection, incompletely visualized. 03/04/2018:Chest CTrevealed a stable chest CT (compared to11/13/2018). The index right lower lobe lung nodule measured7 mm (unchanged).  There was no new or progressive nodularity identified. There was continued decrease in size of ablation defect within segment 4a of the liver. 08/13/2019:[facility] of parastomal hernia with small bowel as well as multiple small bowel loops whichwere dilated.There was hepatic steatosis and post-treatment changes in the pelvis with persistent presacral soft tissue. There was a 3.5 cm infrarenal abdominal aortic aneurysm. 09/09/2019:[facility] no intrathoracic metastasis and no new or enlarging pulmonary nodules. 09/09/2019:Abdomen and pelvis CT at [facility] multiple subcentimeter hypodensities in the dome of the liver, which appeared new/more conspicuous from prior CT. There were not seen on the prior MRI. Further evaluation with MRI was recommended. CT evaluationwas limited due to background fatty liver and the heterogeneity/these lesions could have been secondary to fatty liver. There was unchanged soft tissue stranding and nodularity in the pelvis, with a few prominent more nodular areas. There were a few more prominent indeterminateareasand short-term follow up was recommended to ensure stability and exclude peritoneal disease. Further evaluation with MRI could have been helpful. 11/12/2019: Liver and pelvis MRIrevealedno findings to explain the patient's history of rising CEA. There was irregular presacral soft tissue with apparent scarring noted in the left paracentral pelviswas similar to prior studies and presumably treatment related. PET-CT could be used, as clinically warranted to exclude hypermetabolic disease within thatregion. There was an ablation defect identified in segment IV with minimal irregular enhancement along the anterosuperior margin, indeterminate. Attention on follow-up recommended.There was a tiny cyst identified in the dome of the right liver without other findings to suggest metastatic disease in the  hepatic parenchyma. 11/29/2019:PET scanrevealed 1.6 x 1.5 cmnodularthickeningat the resection margin (SUV 11.3)in the LEFT lower lobe. There was no evidence of hepatic metastasis. There was no evidence of local recurrence in pelvis. There was insufficiency fracture in the LEFT sacral ala presumably related to radiation treatment.   Appointment on 02/10/2020  Component Date Value Ref Range Status  . Sodium 02/10/2020 136  135 - 145 mmol/L Final  . Potassium 02/10/2020 3.2* 3.5 - 5.1 mmol/L Final  . Chloride 02/10/2020 94* 98 - 111 mmol/L Final  . CO2 02/10/2020 31  22 - 32 mmol/L Final  . Glucose, Bld 02/10/2020 117* 70 - 99 mg/dL Final   Glucose  reference range applies only to samples taken after fasting for at least 8 hours.  . BUN 02/10/2020 14  8 - 23 mg/dL Final  . Creatinine, Ser 02/10/2020 0.70  0.44 - 1.00 mg/dL Final  . Calcium 02/10/2020 9.2  8.9 - 10.3 mg/dL Final  . Total Protein 02/10/2020 7.5  6.5 - 8.1 g/dL Final  . Albumin 02/10/2020 4.3  3.5 - 5.0 g/dL Final  . AST 02/10/2020 23  15 - 41 U/L Final  . ALT 02/10/2020 19  0 - 44 U/L Final  . Alkaline Phosphatase 02/10/2020 106  38 - 126 U/L Final  . Total Bilirubin 02/10/2020 0.4  0.3 - 1.2 mg/dL Final  . GFR calc non Af Amer 02/10/2020 >60  >60 mL/min Final  . GFR calc Af Amer 02/10/2020 >60  >60 mL/min Final  . Anion gap 02/10/2020 11  5 - 15 Final   Performed at [facility], [address] [place], [place] [address]  . WBC 02/10/2020 7.0  4.0 - 10.5 K/uL Final  . RBC 02/10/2020 4.74  3.87 - 5.11 MIL/uL Final  . Hemoglobin 02/10/2020 15.0  12.0 - 15.0 g/dL Final  . HCT 02/10/2020 43.2  36.0 - 46.0 % Final  . MCV 02/10/2020 91.1  80.0 - 100.0 fL Final  . MCH 02/10/2020 31.6  26.0 - 34.0 pg Final  . MCHC 02/10/2020 34.7  30.0 - 36.0 g/dL Final  . RDW 02/10/2020 12.5  11.5 - 15.5 % Final  . Platelets 02/10/2020 314  150 - 400 K/uL Final  . nRBC 02/10/2020 0.0  0.0 - 0.2 % Final  . Neutrophils  Relative % 02/10/2020 67  % Final  . Neutro Abs 02/10/2020 4.6  1.7 - 7.7 K/uL Final  . Lymphocytes Relative 02/10/2020 24  % Final  . Lymphs Abs 02/10/2020 1.7  0.7 - 4.0 K/uL Final  . Monocytes Relative 02/10/2020 7  % Final  . Monocytes Absolute 02/10/2020 0.5  0.1 - 1.0 K/uL Final  . Eosinophils Relative 02/10/2020 1  % Final  . Eosinophils Absolute 02/10/2020 0.1  0.0 - 0.5 K/uL Final  . Basophils Relative 02/10/2020 0  % Final  . Basophils Absolute 02/10/2020 0.0  0.0 - 0.1 K/uL Final  . Immature Granulocytes 02/10/2020 1  % Final  . Abs Immature Granulocytes 02/10/2020 0.06  0.00 - 0.07 K/uL Final   Performed at [facility], [address] [place], [place] [address]    Assessment:  BREEANA SAWTELLE is a 64 y.o. female with clinical stage T3N1M1rectal cancer. She presented with a 9 month history of rectal bleeding. Colonoscopyon 01/14/2017 revealed afrond-like/villous non-obstructing large mass in the rectum. The mass was non-circumferential. Biopsies revealed high grade dysplasia within an adenoma (? sampling error). CEAwas 31.6 on 01/15/2017.  PET scanon 01/27/2017 revealed intense metabolic activity associated rectal mass consists with primary rectal carcinoma. There was a hypermetabolic metastatic lesion to the central LEFT hepatic lobe. There was metabolic activity associated with small LEFT lower lobe nodule which was most consistent with pulmonary metastasis.  CT guided liver biopsyat [facility] on 02/07/2017 revealed malignant cells c/w metastatic colorectal adenocarcinoma. Tumor was sent for MMR/MSI, KRAS/NRAS, and BRAF.   CEAhas been followed: 31.6 on 01/15/2017, 38.1 on 02/18/2017, 25.1 on 03/18/2017, 11.8 on 04/08/2017, 9.2 on 04/22/2017, 9.5 on 05/20/2017, 9.6 on 06/19/2017, 3.7 on 02/18/2018,5.8 on 06/26/2018, 10.5 on 11/20/2018, 19.5 on 03/26/2019, 34.0 on 07/21/2019, 46.4 on 09/09/2019, and 54.4 on 10/28/2019.  She received 2 cycles  of FOLFOX  (02/18/2017 - 03/04/2017). She developed transient cold induced neuropathy. She developed wheezing and hypoxia with cycle #2 felt secondary to oxaliplatin. She received 4 cycles ofFOLFIRI(03/24/2017 - 04/22/2017) with Neulasta support. Diarrhea was controlled with Imodium.  She underwent CT guided microwave ablation of the liver lesionon 06/06/2017.   She began radiationon 06/18/2017. She receiveddaily Melody Haver radiation. Radiation and Xeloda completed on 08/15/2017.  She underwentAPR with descending colostomy and VRAM flapon 10/24/2017. She underwent a VATS wedge resectionto her LEFT lung. Pathology from the lung demonstrateda 1 cmmetastatic intestinal type adenocarcinoma c/w metastasis from patient's known colorectal carcinoma.Pathology from the rectal resection revealed a 2.1 cm invasive moderately differentiated adenocarcinoma arising in a tubular adenoma with transmural invasion focally into the perirectal soft tissue. There was no lymphovascular or perineural invasion. Margins were negative. Zero of 20 lymph nodes were positive. Final diagnosiswas [id number] rectal adenocarcinoma with metastasis to left lower lobe of the lung.  Foundation One on 12/29/2019 revealed MS-stable, 1 Muts/Mb tumor mutational burden; KRAS G12C, PTEN rearrangement intron 8, APC G1106*, T1449f*18, and NRAS wildtype.  There were no reportable mutations in BRAF and NRAS.  She has had post-operative pain and ostomy ischemia. She underwent surgical ostomy revisionon 11/28/2017. Pathologyshowed focal mucosal erosion and fat necrosis c/w ischemia. There was extensive underlying fat necrosis. No dysplasia or carcinoma was identified. She was treated with oral Augmentin and wound packing. She has been treated with oxycodone 5 mg po q 4 hrs prn, gabapentin 900 mg TID. She was treated with Bactrim on 01/18/2018. She was referred to local pain management clinic on 01/27/2018.  PET scanon  11/29/2019 showed local recurrence of hypermetabolic nodular lung malignancy at the resection margin(1.6 x 1.5 cm; SUV 11.3)in the LEFT lower lobe. There was no evidence of hepatic metastasis. There was no evidence of local recurrence in pelvis. There was insufficiency fracture in the LEFT sacral ala presumably related to radiation treatment.   Chest CT on 12/13/2019 revealed an enlarging partially calcified 2.3 x 1.2 cm lesion in the medial aspect of the left lower lobe most compatible with a pulmonary metastasis given the hypermetabolism on recent PET-CT. There were several other tiny pulmonary nodules measuring 6 mm or less in size, some of which were new or increased in size compared to prior examinations.  She declined surgical resection.  [facility] did not appear malignant with no adenomatous (granulation tissue). There was diverticulosis in the descending colon, normal mucosa in the entire examined colon and the examined portion of the ileum. There was a 9 mmlipoma in the transverse colon anda4 mm polyp at the surgical stoma.   She has a normocytic anemia.She has an 80 pack yearsmoking history. She is smoking.  [facility] for a smallbowel obstruction. She had severe abdominal pain with associated nausea and vomiting. Abdominal and pelvicCT revealedmultiple small bowel loops whichwere dilated.Obstruction resolved spontaneously.  She does not plan to have the COVID-19 vaccine.   Symptomatically, she has intermittent abdominal pain felt secondary to adhesions.  Plan: 1.   Labs today:CBC with diff, CMP, CEA, folate. 2.   Stage IV rectal carcinoma Patient presented with stage IVrectal cancer.  Patientreceived6cycles of FOLFOX(02/18/2017 -04/22/2017).  She underwent CT guided microwave ablation of the liver lesionon 06/06/2017.  She  receivedradiationand daily Xeloda (06/18/2017-08/15/2017). She underwentAPR with descending colostomy and VRAM flapon 10/24/2017.  She underwent a VATS wedge resectionto her LEFT lung. CEA has increased from 3.7 on 05/15/2019to54.4 on 10/28/2019. Abdomen and pelvis CT at  [facility] on 09/09/2019 revealed multiple subcentimeter hypodensities in the liver concerning for metastasis. Liver and pelvis MRI at [facility] on 11/12/2019 revealedirregular soft tissue in the left pelvis(unclear significance). 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             Chest CT on 12/13/2019 revealed an enlarging partially calcified 2.3 x 1.2 cm lesion in the medial aspect of the left lower lobe c/w pulmonary metastasis.                          There were several other tiny pulmonary nodules measuring 6 mm or less in size.             Patient declined left lower lobe lobectomy. Discuss plan for baseline imaging (chest, abdomen, and pelvis CT) prior to initiation of chemotherapy.  Follow-up NexGen sequencing on biopsy. Discuss plan for systemic chemotherapy based on Foundation One testing- contact [facility] regarding available results.   Tentatively discuss FOLFIRI +/- panitumumab based on KRAS testing (if RAS/BRAF wild type). 3. Recurrent abdominal pain Patient continues to have intermittent pain likely secondary torecurrent partial bowel obstruction.   Pain typically lasts for < 4 days. Colonoscopy revealed no lesions. Patient has suspected adhesions causing pain. Liver and pelvis MRI on 11/12/2019 reveal no obvious culprit. PET scan revealed no intra-abdominalmetastatic disease.             Discuss pain for follow-up if symptoms do not resolve. 4.   B12 deficiency B12was 223 (low) on  12/03/2019.  Patient restarted B12 injections on 12/13/2019.   She has recieved none since.  B12 today and monthly x 6. 5.   Folate deficiency Folatewas 5.8 (low) on 12/03/2019. Patient continues folic acid. Check folate level today. 6.Hypokalemia Potassium3.2 today. Patient has been off her oral potassium x 1-2 month.  Rx:  potassium 10 meq BID x 1 week then 10 meq a day. 7.Chest, abdomen, and pelvis CT on 02/14/2020. 8.   Preauth FOLFIRI. 9.   RTC after CT scan for MD assessment, review of imaging and discussion regarding direction of therapy.  Addendum:  I spoke with the [facility] [phone]; [phone]) regarding pathology sent with her original resections.  Foundation One on 12/29/2019 [phone]) revealed MS-stable, 1 Muts/Mb tumor mutational burden; KRAS G12C, PTEN rearrangement intron 8, APC G1106*, T1470f*18, and NRAS wildtype.  There were no reportable mutations in BRAF and NRAS.  Because of KRAS mutation, treatment would thus not involve cetuximab or panitumumab, but FOLFIRI + Avastin.  I discussed the assessment and treatment plan with the patient.  The patient was provided an opportunity to ask questions and all were answered.  The patient agreed with the plan and demonstrated an understanding of the instructions.  The patient was advised to call back if the symptoms worsen or if the condition fails to improve as anticipated.  I provided 36 minutes of face-to-face time during this this encounter and > 50% was spent counseling as documented under my assessment and plan. An additional 15 minutes were spent reviewing her chart (Epic and CR[facility]gway including notes, labs, and imaging studies and contacting [facility].    MLequita Asal MD, PhD    02/10/2020, 2:26 PM  I, ASelena Batten am acting as scribe for [employer] CMike Gip MD, PhD.  I, Jacion Dismore C. CMike Gip MD, have reviewed  the above documentation for accuracy and completeness, and I agree with the above.

## 2020-02-09 NOTE — Progress Notes (Signed)
Patient states that she has missed a few appointments due to her husband being hospitalized for heart attacks and stroke. She states that she does not want lung surgery after seeing him on the vent in the hospital. She wants to start chemo. Over the last couple days she has had abdominal spasms that cause her nausea and vomiting, the zofran has helped. She is not sleeping well because she has to wake up to help her husband in the night.

## 2020-02-10 ENCOUNTER — Encounter: Payer: Self-pay | Admitting: Hematology and Oncology

## 2020-02-10 ENCOUNTER — Other Ambulatory Visit: Payer: Self-pay | Admitting: Hematology and Oncology

## 2020-02-10 ENCOUNTER — Inpatient Hospital Stay: Payer: Medicare HMO

## 2020-02-10 ENCOUNTER — Inpatient Hospital Stay: Payer: Medicare HMO | Attending: Hematology and Oncology | Admitting: Hematology and Oncology

## 2020-02-10 VITALS — BP 153/69 | HR 90 | Temp 96.9°F | Wt 192.9 lb

## 2020-02-10 DIAGNOSIS — C7802 Secondary malignant neoplasm of left lung: Secondary | ICD-10-CM | POA: Insufficient documentation

## 2020-02-10 DIAGNOSIS — Z7189 Other specified counseling: Secondary | ICD-10-CM

## 2020-02-10 DIAGNOSIS — Z87891 Personal history of nicotine dependence: Secondary | ICD-10-CM | POA: Insufficient documentation

## 2020-02-10 DIAGNOSIS — C787 Secondary malignant neoplasm of liver and intrahepatic bile duct: Secondary | ICD-10-CM | POA: Diagnosis not present

## 2020-02-10 DIAGNOSIS — E538 Deficiency of other specified B group vitamins: Secondary | ICD-10-CM | POA: Insufficient documentation

## 2020-02-10 DIAGNOSIS — C20 Malignant neoplasm of rectum: Secondary | ICD-10-CM

## 2020-02-10 DIAGNOSIS — E876 Hypokalemia: Secondary | ICD-10-CM

## 2020-02-10 DIAGNOSIS — R109 Unspecified abdominal pain: Secondary | ICD-10-CM | POA: Diagnosis not present

## 2020-02-10 LAB — COMPREHENSIVE METABOLIC PANEL
ALT: 19 U/L (ref 0–44)
AST: 23 U/L (ref 15–41)
Albumin: 4.3 g/dL (ref 3.5–5.0)
Alkaline Phosphatase: 106 U/L (ref 38–126)
Anion gap: 11 (ref 5–15)
BUN: 14 mg/dL (ref 8–23)
CO2: 31 mmol/L (ref 22–32)
Calcium: 9.2 mg/dL (ref 8.9–10.3)
Chloride: 94 mmol/L — ABNORMAL LOW (ref 98–111)
Creatinine, Ser: 0.7 mg/dL (ref 0.44–1.00)
GFR calc Af Amer: >60 mL/min (ref >60)
GFR calc non Af Amer: >60 mL/min (ref >60)
Glucose, Bld: 117 mg/dL — ABNORMAL HIGH (ref 70–99)
Potassium: 3.2 mmol/L — ABNORMAL LOW (ref 3.5–5.1)
Sodium: 136 mmol/L (ref 135–145)
Total Bilirubin: 0.4 mg/dL (ref 0.3–1.2)
Total Protein: 7.5 g/dL (ref 6.5–8.1)

## 2020-02-10 LAB — CBC WITH DIFFERENTIAL/PLATELET
Abs Immature Granulocytes: 0.06 10*3/uL (ref 0.00–0.07)
Basophils Absolute: 0 10*3/uL (ref 0.0–0.1)
Basophils Relative: 0 %
Eosinophils Absolute: 0.1 10*3/uL (ref 0.0–0.5)
Eosinophils Relative: 1 %
HCT: 43.2 % (ref 36.0–46.0)
Hemoglobin: 15 g/dL (ref 12.0–15.0)
Immature Granulocytes: 1 %
Lymphocytes Relative: 24 %
Lymphs Abs: 1.7 10*3/uL (ref 0.7–4.0)
MCH: 31.6 pg (ref 26.0–34.0)
MCHC: 34.7 g/dL (ref 30.0–36.0)
MCV: 91.1 fL (ref 80.0–100.0)
Monocytes Absolute: 0.5 10*3/uL (ref 0.1–1.0)
Monocytes Relative: 7 %
Neutro Abs: 4.6 10*3/uL (ref 1.7–7.7)
Neutrophils Relative %: 67 %
Platelets: 314 10*3/uL (ref 150–400)
RBC: 4.74 MIL/uL (ref 3.87–5.11)
RDW: 12.5 % (ref 11.5–15.5)
WBC: 7 10*3/uL (ref 4.0–10.5)
nRBC: 0 % (ref 0.0–0.2)

## 2020-02-10 MED ORDER — CYANOCOBALAMIN 1000 MCG/ML IJ SOLN
1000.0000 ug | Freq: Once | INTRAMUSCULAR | Status: AC
  Administered 2020-02-10: 1000 ug via INTRAMUSCULAR

## 2020-02-10 MED ORDER — POTASSIUM CHLORIDE CRYS ER 10 MEQ PO TBCR: 10.0000 meq | EXTENDED_RELEASE_TABLET | Freq: Two times a day (BID) | ORAL | 1 refills | Status: DC

## 2020-02-10 NOTE — Patient Instructions (Signed)
  Take potassium 1 pill twice a day for 7 days then take 1 pill a day.

## 2020-02-11 LAB — CEA: CEA: 108 ng/mL — ABNORMAL HIGH (ref 0.0–4.7)

## 2020-02-11 LAB — FOLATE: Folate: 52 ng/mL (ref >5.9)

## 2020-02-13 NOTE — Progress Notes (Signed)
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

## 2020-02-13 NOTE — Progress Notes (Signed)
START ON PATHWAY REGIMEN - Colorectal     A cycle is every 14 days:     Bevacizumab-xxxx      Irinotecan      Leucovorin      Fluorouracil      Fluorouracil   **Always confirm dose/schedule in your pharmacy ordering system**  Patient Characteristics: Distant Metastases, Nonsurgical Candidate, KRAS/NRAS Mutation Positive/Unknown (BRAF V600 Wild-Type/Unknown), Standard Cytotoxic Therapy, Second Line Standard Cytotoxic Therapy, Bevacizumab Eligible Tumor Location: Rectal Therapeutic Status: Distant Metastases Microsatellite/Mismatch Repair Status: MSS/pMMR BRAF Mutation Status: Wild-Type (no mutation) KRAS/NRAS Mutation Status: Mutation Positive Standard Cytotoxic Line of Therapy: Second Line Standard Cytotoxic Therapy Bevacizumab Eligibility: Eligible Intent of Therapy: Non-Curative / Palliative Intent, Discussed with Patient

## 2020-02-13 NOTE — Progress Notes (Signed)
ON PATHWAY REGIMEN - Colorectal  No Change  Continue With Treatment as Ordered.     A cycle is every 14 days:     Oxaliplatin      Leucovorin      5-Fluorouracil      5-Fluorouracil      Bevacizumab   **Always confirm dose/schedule in your pharmacy ordering system**  Patient Characteristics: Metastatic Colorectal, First Line, Potentially Resectable, KRAS Mutation Positive/Unknown, BRAF Wild-Type/Unknown Current evidence of distant metastases<= Yes AJCC T Category: T3 AJCC N Category: N1 AJCC M Category: M1b AJCC 8 Stage Grouping: IVB BRAF Mutation Status: Awaiting Test Results KRAS/NRAS Mutation Status: Awaiting Test Results Line of therapy: First Line  Intent of Therapy: Curative Intent, Discussed with Patient

## 2020-02-14 ENCOUNTER — Encounter: Payer: Self-pay | Admitting: Surgical Oncology

## 2020-02-14 ENCOUNTER — Encounter: Payer: Self-pay | Admitting: Hematology and Oncology

## 2020-02-16 ENCOUNTER — Telehealth: Payer: Self-pay | Admitting: *Deleted

## 2020-02-22 NOTE — Progress Notes (Signed)
[facility]  [address], [address] [place], [place] [address] Phone: [phone]  Fax: [phone]   Clinic Day:  02/24/2020  Referring physician: Loni Muse, MD  Chief Complaint: Tamara Hall is a 64 y.o. 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   HPI: The patient was last seen in the medical oncology clinic on 02/10/2020. At that time, she had intermittent abdominal pain felt secondary to adhesions. CBC was normal. Potassium was 3.2. Folate 52.0.  CEA was 108.0. She continued on folic acid. Patient was directed to take oral potassium 10 meq BID x 1 week then 10 meq a day.  She declined surgery.  We discussed restaging studies prior to planned chemotherapy.  Patient received a B12 injection.   [facility] molecular pathology reported no mutations in BRAF and NRAS.  Because of KRAS mutation, cetuximab or panitumumab was not felt to be an option.  FOLFIRI + Avastin was preauthorized.  Chest, abdomen and pelvis CT on 02/23/2020 revealed a stable 2.6 x 1.7 cm partially calcified left infrahilar pulmonary nodule, hypermetabolic on PET scan and likely metastasis.  There were new no new or enlarging pulmonary nodules.  There were stable treated hepatic metastatic disease with no evidence of progression or local recurrence.  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  There is no evidence of incarceration or obstruction.  There was mild wall thickening of multiple loops of small bowel in the mid abdomen, possibly related to prior radiation therapy.  There was stable chronic sacral insufficiency fractures bilaterally.  There was a stable 3.7 cm fusiform infrarenal abdominal aortic aneurysm.  Follow-up with ultrasound was recommended in 2 years.  Symptomatically, she is feeling  "better than yesterday". She had some abdominal pain yesterday. Her husband is doing much better. The patient is not interested in surgery. She expressed understanding regarding antibody treatment and chemotherapy. Patient agreed to radiation.   She has a dental appointment with Dr. Nicola Girt [phone]) on 03/20/2020 for one extraction and two fillings. She needs my clearance prior to dental appointment.   Past Medical History:  Diagnosis Date  . Cancer ([place])    colon, with mets to liver and left lung  . Hypertension   . Pneumonia    20+ years ago    Past Surgical History:  Procedure Laterality Date  . ABDOMINAL HYSTERECTOMY    . BREAST SURGERY    . COLON SURGERY  10/2017   [facility]  . COLONOSCOPY WITH PROPOFOL N/A 01/14/2017   Procedure: COLONOSCOPY WITH PROPOFOL;  Surgeon: Lucilla Lame, MD;  Location: [facility] ENDOSCOPY;  Service: Endoscopy;  Laterality: N/A;  . ELBOW SURGERY Right    Has had 4 surgeries on right elbow.  Marland Kitchen HAND SURGERY Right 2008  . JOINT REPLACEMENT    . LIVER BIOPSY  10/2017  . lung wedge resection  10/2017  . PORTA CATH INSERTION N/A 01/29/2017   Procedure: Glori Luis Cath Insertion;  Surgeon: Algernon Huxley, MD;  Location: [place] [facility];  Service: Cardiovascular;  Laterality: N/A;  . TONSILLECTOMY      Family History  Problem Relation Age of Onset  . Lung cancer Mother 36  . Esophageal cancer Father 2  . Brain cancer Maternal Grandmother     Social History:  reports that she quit smoking about 2 years ago. Her smoking  use included cigarettes. She has a 80.00 pack-year smoking history. She has never used smokeless tobacco. She reports that she does not drink alcohol or use drugs. She was smoking 1/2 packs/day.She lives in [place]. She has 3 children (ages 8, 105, and 73). She smoked 2 packs a day. She started smoking at age 79. Stopped smoking on 10/24/2017. 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 [name] is  her significant other. The patient is alone today.  Allergies: No Known Allergies  Current Medications: Current Outpatient Medications  Medication Sig Dispense Refill  . albuterol (VENTOLIN HFA) 108 (90 Base) MCG/ACT inhaler Inhale into the lungs.    Marland Kitchen amLODipine (NORVASC) 5 MG tablet Take 5 mg by mouth daily.    . baclofen (LIORESAL) 10 MG tablet Take 10 mg by mouth 3 (three) times daily.    . folic acid (FOLVITE) 1 MG tablet Take 1 tablet (1 mg total) by mouth daily. 30 tablet 3  . gabapentin (NEURONTIN) 300 MG capsule Take 1,200 mg by mouth 3 (three) times daily.     . hydrochlorothiazide (HYDRODIURIL) 25 MG tablet Take 25 mg by mouth daily.    Marland Kitchen HYDROcodone-acetaminophen (NORCO/VICODIN) 5-325 MG tablet Take 1 tablet by mouth every 6 (six) hours as needed for moderate pain. 15 tablet 0  . nortriptyline (PAMELOR) 10 MG capsule Take 10 mg by mouth at bedtime.    . ondansetron (ZOFRAN ODT) 8 MG disintegrating tablet Take 1 tablet (8 mg total) by mouth every 8 (eight) hours as needed for nausea or vomiting. 20 tablet 1  . Ostomy Supplies (PREMIER DRAINABLE POUCH 64MM) Pouch MISC     . potassium chloride (KLOR-CON M10) 10 MEQ tablet Take 1 tablet (10 mEq total) by mouth 2 (two) times daily. 60 tablet 1  . gabapentin (NEURONTIN) 300 MG capsule TAKE 1 CAPSULE BY MOUTH THREE TIMES A DAY (Patient not taking: No sig reported) 90 capsule 0  . LORazepam (ATIVAN) 0.5 MG tablet Take one tablet (0.5 mg) by mouth 30 minutes prior to the procedure.  Patient must have a driver. (Patient not taking: Reported on 02/09/2020) 2 tablet 0   No current facility-administered medications for this visit.    Review of Systems  Constitutional: Negative for chills, diaphoresis, fever, malaise/fatigue and weight loss (stable).       Feeling "better than yesterday".  HENT: Negative for congestion, ear discharge, ear pain, hearing loss, nosebleeds, sinus pain, sore throat and tinnitus.        Dental work partially  complete.  Eyes: Negative for blurred vision.  Respiratory: Negative for cough, hemoptysis, sputum production and shortness of breath.   Cardiovascular: Negative for chest pain, palpitations and leg swelling.  Gastrointestinal: Positive for abdominal pain (intermittent labor-like pain occurring up to 4 days at a time). Negative for blood in stool, constipation, diarrhea, heartburn, melena, nausea and vomiting.       Colostomy s/p APR.  Genitourinary: Negative for dysuria, frequency, hematuria and urgency.  Musculoskeletal: Negative for back pain (lower back), joint pain, myalgias and neck pain.  Skin: Negative for itching and rash.  Neurological: Negative for dizziness, tingling, sensory change, weakness and headaches.  Endo/Heme/Allergies: Does not bruise/bleed easily.  Psychiatric/Behavioral: Negative for depression. The patient is not nervous/anxious and does not have insomnia.   All other systems reviewed and are negative.   Performance status (ECOG):  1  Vitals Blood pressure (!) 151/73, pulse 93, temperature (!) 96.5 F (  35.8 C), temperature source Tympanic, resp. rate 18, weight 192 lb 2.1 oz (87.1 kg), SpO2 100 %.   Physical Exam  Constitutional: She is oriented to person, place, and time. She appears well-developed and well-nourished. No distress.  She has a rolling walker by her side.  HENT:  Head: Normocephalic and atraumatic.  Long blonde/gray hair pulled back. Mask.  Eyes: Conjunctivae and EOM are normal. No scleral icterus.  Blue eyes.  Neurological: She is alert and oriented to person, place, and time.  Psychiatric: She has a normal mood and affect. Her behavior is normal. Judgment and thought content normal.  Nursing note and vitals reviewed.   Imaging studies: 01/13/2017:Abdomen and pelvic CTrevealed irregular thickening of the rectum suspicious for carcinoma. There was a 1.6 cm hypoattenuating lesion in the right hepatic lobe worrisome for metastatic  disease. 01/15/2017:Chest CT revealed a 9 mm noncalcified left lower lobe pulmonary nodule (new). 01/16/2017:Pelvic MRIrevealed rectal adenocarcinoma T3N1. There was extension beyond the muscularis propria (17 mm). There were mesorectal nodes >= 5 mm. There was adenopathy on the left side of the mesorectum, including a 9 mm lesion. The distance from the tumor to the anal sphincter was 8 mm. 01/27/2017:PET scanrevealed intense metabolic activity associated rectal mass consists with primary rectal carcinoma. There was a hypermetabolic metastatic lesion to the central LEFT hepatic lobe. There was metabolic activity associated with small LEFT lower lobe nodule which was most consistent with pulmonary metastasis. 01/31/2017:Liver MRIrevealed a 2.6 x 2.7 x 2.1 cm lesion in segment VIII of the liver near the junction with segment IVa c/w a metastatic lesion. There was a 1.1 x 0.8 cm suspected metastatic lesion peripherally in segment VI of the liver.  05/16/2017:Abdomen and pelvic CTrevealed persistent but decreased asymmetric wall thickening in the rectum. The medial segment left liver lesion was smaller (2.7 x 2.6 cm to 1.9 x 1.6 cm). A tiny lesion seen in segment VI of the liver on previous MRI was not evident on today's CT scan. The left lower lobe pulmonary nodule was smaller (9 mm to 6 mm). There was no new or progressive findings in the abdomen and pelvis. 08/19/2017:Chest CTwith contrast on 08/19/2017 revealed left lower lobe 6 mm solid pulmonary nodule, decreased since 01/15/2017 chest CT. Stable 7 mm right lower lobe pulmonary nodule. There was no new or progressive metastatic disease in the chest.  09/03/2018: Pelvic MRI at [facility] demonstrated a partial response of the primary tumor and extramural disease. Post treatment category ymrT T3b. There was an overall decrease in size in the previously seen perirectal node. Hypointense changes at the site of prior tumor focally  contacedt buts does not definitely invade the LEFT levator ani. Tumor contacted, but does not definitely invade the internal anal sphincter. No evidence of residual or recurrent hepatic disease following microwave ablation. 02/17/2018:Abdomen and pelvic CTrevealed evolution of the ablation zone in segment 4A, now measuring 3.7 x 3.1 cm, decreased.There was no findings to suggest viable tumor.She is s/pabdominoperineal resection with left lower quadrant colostomy. There was no evidence of new/progressive metastatic disease.She is s/pleft lower lobe pulmonary wedge resection, incompletely visualized. 03/04/2018:Chest CTrevealed a stable chest CT (compared to11/13/2018). The index right lower lobe lung nodule measured7 mm (unchanged). There was no new or progressive nodularity identified. There was continued decrease in size of ablation defect within segment 4a of the liver. 08/13/2019:Abdominaland pelvisCT at [facility] of parastomal hernia with small bowel as well as multiple small bowel loops whichwere dilated.There was hepatic steatosis and post-treatment changes in  the pelvis with persistent presacral soft tissue. There was a 3.5 cm infrarenal abdominal aortic aneurysm. 09/09/2019:Chest CT at [facility] no intrathoracic metastasis and no new or enlarging pulmonary nodules. 09/09/2019:Abdomen and pelvis CT at [facility] multiple subcentimeter hypodensities in the dome of the liver, which appeared new/more conspicuous from prior CT. There were not seen on the prior MRI. Further evaluation with MRI was recommended. CT evaluationwas limited due to background fatty liver and the heterogeneity/these lesions could have been secondary to fatty liver. There was unchanged soft tissue stranding and nodularity in the pelvis, with a few prominent more nodular areas. There were a few more prominent indeterminateareasand short-term follow up was recommended to ensure  stability and exclude peritoneal disease. Further evaluation with MRI could have been helpful. 11/12/2019: Liver and pelvis MRIrevealedno findings to explain the patient's history of rising CEA. There was irregular presacral soft tissue with apparent scarring noted in the left paracentral pelviswas similar to prior studies and presumably treatment related. PET-CT could be used, as clinically warranted to exclude hypermetabolic disease within thatregion. There was an ablation defect identified in segment IV with minimal irregular enhancement along the anterosuperior margin, indeterminate. Attention on follow-up recommended.There was a tiny cyst identified in the dome of the right liver without other findings to suggest metastatic disease in the hepatic parenchyma. 11/29/2019:PET scanrevealed 1.6 x 1.5 cmnodularthickeningat the resection margin (SUV 11.3)in the LEFT lower lobe. There was no evidence of hepatic metastasis. There was no evidence of local recurrence in pelvis. There was insufficiency fracture in the LEFT sacral ala presumably related to radiation treatment. 02/23/2020:  Chest, abdomen and pelvis CT revealed a stable 2.6 x 1.7 cm partially calcified left infrahilar pulmonary nodule, hypermetabolic on PET scan and likely metastasis.  There were new no new or enlarging pulmonary nodules.  There were stable treated hepatic metastatic disease with no evidence of progression or local recurrence.  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  There is no evidence of incarceration or obstruction.  There was mild wall thickening of multiple loops of small bowel in the mid abdomen, possibly related to prior radiation therapy.  There was stable chronic sacral insufficiency fractures bilaterally.  There was a stable 3.7 cm fusiform infrarenal abdominal aortic aneurysm.  Follow-up with ultrasound was recommended in 2 years.   No visits with results  within 3 Day(s) from this visit.  Latest known visit with results is:  Appointment on 02/10/2020  Component Date Value Ref Range Status  . Folate 02/10/2020 52.0  >5.9 ng/mL Final   Comment: RESULT CONFIRMED BY MANUAL DILUTION RH Performed at [facility], [address] [place], [place] [address]   . CEA 02/10/2020 108.0* 0.0 - 4.7 ng/mL Final   Comment: (NOTE)                             Nonsmokers          <3.9                             Smokers             <5.6 [employer] Electrochemiluminescence Immunoassay (ECLIA) Values obtained with different assay methods or kits cannot be used interchangeably.  Results cannot be interpreted as absolute evidence of the presence or absence of malignant disease. Performed At: [facility] [place], [place]  [id number] Rush Farmer MD MO:[id number]   . Sodium 02/10/2020 136  135 - 145 mmol/L Final  . Potassium 02/10/2020 3.2* 3.5 - 5.1 mmol/L Final  . Chloride 02/10/2020 94* 98 - 111 mmol/L Final  . CO2 02/10/2020 31  22 - 32 mmol/L Final  . Glucose, Bld 02/10/2020 117* 70 - 99 mg/dL Final   Glucose reference range applies only to samples taken after fasting for at least 8 hours.  . BUN 02/10/2020 14  8 - 23 mg/dL Final  . Creatinine, Ser 02/10/2020 0.70  0.44 - 1.00 mg/dL Final  . Calcium 02/10/2020 9.2  8.9 - 10.3 mg/dL Final  . Total Protein 02/10/2020 7.5  6.5 - 8.1 g/dL Final  . Albumin 02/10/2020 4.3  3.5 - 5.0 g/dL Final  . AST 02/10/2020 23  15 - 41 U/L Final  . ALT 02/10/2020 19  0 - 44 U/L Final  . Alkaline Phosphatase 02/10/2020 106  38 - 126 U/L Final  . Total Bilirubin 02/10/2020 0.4  0.3 - 1.2 mg/dL Final  . GFR calc non Af Amer 02/10/2020 >60  >60 mL/min Final  . GFR calc Af Amer 02/10/2020 >60  >60 mL/min Final  . Anion gap 02/10/2020 11  5 - 15 Final   Performed at [facility], [address] [place], [place] [address]  . WBC 02/10/2020 7.0  4.0 - 10.5 K/uL Final    . RBC 02/10/2020 4.74  3.87 - 5.11 MIL/uL Final  . Hemoglobin 02/10/2020 15.0  12.0 - 15.0 g/dL Final  . HCT 02/10/2020 43.2  36.0 - 46.0 % Final  . MCV 02/10/2020 91.1  80.0 - 100.0 fL Final  . MCH 02/10/2020 31.6  26.0 - 34.0 pg Final  . MCHC 02/10/2020 34.7  30.0 - 36.0 g/dL Final  . RDW 02/10/2020 12.5  11.5 - 15.5 % Final  . Platelets 02/10/2020 314  150 - 400 K/uL Final  . nRBC 02/10/2020 0.0  0.0 - 0.2 % Final  . Neutrophils Relative % 02/10/2020 67  % Final  . Neutro Abs 02/10/2020 4.6  1.7 - 7.7 K/uL Final  . Lymphocytes Relative 02/10/2020 24  % Final  . Lymphs Abs 02/10/2020 1.7  0.7 - 4.0 K/uL Final  . Monocytes Relative 02/10/2020 7  % Final  . Monocytes Absolute 02/10/2020 0.5  0.1 - 1.0 K/uL Final  . Eosinophils Relative 02/10/2020 1  % Final  . Eosinophils Absolute 02/10/2020 0.1  0.0 - 0.5 K/uL Final  . Basophils Relative 02/10/2020 0  % Final  . Basophils Absolute 02/10/2020 0.0  0.0 - 0.1 K/uL Final  . Immature Granulocytes 02/10/2020 1  % Final  . Abs Immature Granulocytes 02/10/2020 0.06  0.00 - 0.07 K/uL Final   Performed at [facility], [address] [place], [place] [address]    Assessment:  Tamara Hall is a 64 y.o. female with clinical stage T3N1M1rectal cancer. She presented with a 9 month history of rectal bleeding. Colonoscopyon 01/14/2017 revealed afrond-like/villous non-obstructing large mass in the rectum. The mass was non-circumferential. Biopsies revealed high grade dysplasia within an adenoma (? sampling error). CEAwas 31.6 on 01/15/2017.  PET scanon 01/27/2017 revealed intense metabolic activity associated rectal mass consists with primary rectal carcinoma. There was a hypermetabolic metastatic lesion to the central LEFT hepatic lobe. There was metabolic activity associated with small LEFT lower lobe nodule which was most consistent with pulmonary metastasis.  CT guided liver [facility]at [facility] on 02/07/2017 revealed  malignant cells  c/w metastatic colorectal adenocarcinoma. [facility] on 12/29/2019 [phone]) revealed MS-stable, 1 Muts/Mb tumor mutational burden; KRAS G12C, PTEN rearrangement intron 8, APC G1106*, [id number], and NRAS wildtype.  There were no reportable mutations in BRAF and NRAS.     CEAhas been followed: 31.6 on 01/15/2017, 38.1 on 02/18/2017, 25.1 on 03/18/2017, 11.8 on 04/08/2017, 9.2 on 04/22/2017, 9.5 on 05/20/2017, 9.6 on 06/19/2017, 3.7 on 02/18/2018,5.8 on 06/26/2018, 10.5 on 11/20/2018, 19.5 on 03/26/2019, 34.0 on 07/21/2019, 46.4 on 09/09/2019, 54.4 on 10/28/2019, and 108.0 on 02/10/2020.  She received 2 cycles of FOLFOX (02/18/2017 - 03/04/2017). She developed transient cold induced neuropathy. She developed wheezing and hypoxia with cycle #2 felt secondary to oxaliplatin. She received 4 cycles ofFOLFIRI(03/24/2017 - 04/22/2017) with Neulasta support. Diarrhea was controlled with Imodium.  She underwent CT guided microwave ablation of the liver lesionon 06/06/2017.   She began radiationon 06/18/2017. She receiveddaily XMelody Haverradiation. Radiation and Xeloda completed on 08/15/2017.  She underwentAPR with descending colostomy and VRAM flapon 10/24/2017. She underwent a VATS wedge resectionto her LEFT lung. Pathology from the lung demonstrateda 1 cmmetastatic intestinal type adenocarcinoma c/w metastasis from patient's known colorectal carcinoma.Pathology from the rectal resection revealed a 2.1 cm invasive moderately differentiated adenocarcinoma arising in a tubular adenoma with transmural invasion focally into the perirectal soft tissue. There was no lymphovascular or perineural invasion. Margins were negative. Zero of 20 lymph nodes were positive. Final diagnosiswas [id number] adenocarcinoma with metastasis to left lower lobe of the lung.  [facility] on 12/29/2019 revealed MS-stable, 1 Muts/Mb tumor mutational burden; KRAS G12C,  PTEN rearrangement intron 8, APC G1106*, [id number], and NRAS wildtype.  There were no reportable mutations in BRAF and NRAS.  She has had post-operative pain and ostomy ischemia. She underwent surgical ostomy revisionon 11/28/2017. Pathologyshowed focal mucosal erosion and fat necrosis c/w ischemia. There was extensive underlying fat necrosis. No dysplasia or carcinoma was identified. She was treated with oral Augmentin and wound packing. She has been treated with oxycodone 5 mg po q 4 hrs prn, gabapentin 900 mg TID. She was treated with Bactrim on 01/18/2018. She was referred to local pain management clinic on 01/27/2018.  PET scanon 11/29/2019 showed local recurrence of hypermetabolic nodular lung malignancy at the resection margin(1.6 x 1.5 cm; SUV 11.3)in the LEFT lower lobe. There was no evidence of hepatic metastasis. There was no evidence of local recurrence in pelvis. There was insufficiency fracture in the LEFT sacral ala presumably related to radiation treatment.   Chest CT on 12/13/2019 revealed an enlarging partially calcified 2.3 x 1.2 cm lesion in the medial aspect of the left lower lobe most compatible with a pulmonary metastasis given the hypermetabolism on recent PET-CT. There were several other tiny pulmonary nodules measuring 6 mm or less in size, some of which were new or increased in size compared to prior examinations.  She declined surgical resection.  Chest, abdomen and pelvis CT on 02/23/2020 revealed a stable 2.6 x 1.7 cm partially calcified left infrahilar pulmonary nodule, hypermetabolic on PET scan and likely metastasis.  There were new no new or enlarging pulmonary nodules.  There were stable treated hepatic metastatic disease with no evidence of progression or local recurrence.  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  There is no evidence of incarceration or obstruction.  There was mild wall thickening of  multiple loops of small bowel in the mid abdomen, possibly related to prior radiation therapy.  There was stable chronic sacral insufficiency  fractures bilaterally.  There was a stable 3.7 cm fusiform infrarenal abdominal aortic aneurysm.   Colonoscopy at [facility] 09/13/2019 did not appear malignant with no adenomatous (granulation tissue). There was diverticulosis in the descending colon, normal mucosa in the entire examined colon and the examined portion of the ileum. There was a 9 mmlipoma in the transverse colon anda4 mm polyp at the surgical stoma.   She has a B12 deficiency.B12was 223 (low) on 12/03/2019. She restarted B12 injections on 12/13/2019 (last 02/10/2020).  She has an 80 pack yearsmoking history. She is smoking.  [facility] for a smallbowel obstruction. She had severe abdominal pain with associated nausea and vomiting. Abdominal and pelvicCT revealedmultiple small bowel loops whichwere dilated.Obstruction resolved spontaneously.  She does not plan to have the COVID-19 vaccine.   Symptomatically, she is feeling better today.  She notes needed dental work.  Plan: 1.   Stage IV rectal carcinoma Patient presented with stage IVrectal cancer.  Patientreceived6cycles of FOLFOX(02/18/2017 -04/22/2017).  She underwent CT guided microwave ablation of the liver lesionon 06/06/2017.  She receivedradiationand daily Xeloda (06/18/2017-08/15/2017). She underwentAPR with descending colostomy and VRAM flapon 10/24/2017.  She underwent a VATS wedge resectionto her LEFT lung. CEA has increased from 3.7 on 05/15/2019to54.4 on 10/28/2019. Abdom[facility] and [facility] on 09/09/2019 revealed multiple subcentimeter hypodensities in the liver concerning for metastasis. Liver and pelvis MRI at [facility] on  11/12/2019 revealedirregular soft tissue in the left pelvis(unclear significance). 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             Chest CT on 12/13/2019 revealed an enlarging partially calcified 2.3 x 1.2 cm lesion in the medial aspect of the left lower lobe c/w pulmonary metastasis.                          There were several other tiny pulmonary nodules measuring 6 mm or less in size.             Patient declined left lower lobe lobectomy. Chest, abdomen, and pelvis CT on 02/23/2020 was personally reviewed.  Agree with radiology interpretation.   She has an isolated left infrahilar pulmonary nodule.  Patient declines surgery.  N[employer] sequencing revealed a KRAS mutation. Review plans for FOLFIRI +/- Avastin.   Review potential side effects.   Information provided.   Treatment is palliative.  Discuss plan to postpone Avastin if dental surgery needed- contact Dr Nicola Girt. 2. Recurrent abdominal pain Patient has intermittent pain likely secondary torecurrent partial bowel obstruction.   Pain typically lasts for < 4 days. Colonoscopy revealed no lesions. Patient has suspected adhesions causing pain. Liver and pelvis MRI on 11/12/2019 reveal no obvious culprit. PET scan revealed no intra-abdominalmetastatic disease.             Continue close monitoring.  Patient to contact clinic if symptoms change. 3.   B12 deficiency B12was 223 (low) on 12/03/2019.  Patient restarted B12 injections on 12/13/2019.  Patient received B12 on 02/10/2020.  Continue monthly B12. 4.   Folate deficiency Folatewas 5.8 (low) on 12/03/2019. Patient on folic acid with follow-up folate level 52 on 02/10/2020.  Continue oral folic acid. 5.Hypokalemia Potassiumwas 3.2 on 02/10/2020. Continue oral potassium  supplementation. 6.   MD to call patient's dentist about initiation of chemotherapy. 7.   RTC on 06/01 or 03/08/2020 for MD assessment, labs (CBC with diff, CMP, CEA), and FOLFIRI +/- Avastin.  I discussed  the assessment and treatment plan with the patient.  The patient was provided an opportunity to ask questions and all were answered.  The patient agreed with the plan and demonstrated an understanding of the instructions.  The patient was advised to call back if the symptoms worsen or if the condition fails to improve as anticipated.  I provided 11 minutes of face-to-face time during this this encounter and > 50% was spent counseling as documented under my assessment and plan.  An additional 8-10 minutes were spent reviewing her chart (Epic and Care Everywhere) including notes, labs, and imaging studies.    Lequita Asal, MD, PhD    02/24/2020, 1:50 PM  I, Selena Batten, am acting as scribe for [employer]. Mike Gip, MD, PhD.  I, Fernanda Twaddell C. Mike Gip, MD, have reviewed the above documentation for accuracy and completeness, and I agree with the above.

## 2020-02-23 ENCOUNTER — Other Ambulatory Visit: Payer: Self-pay

## 2020-02-23 ENCOUNTER — Ambulatory Visit
Admission: RE | Admit: 2020-02-23 | Discharge: 2020-02-23 | Disposition: A | Discharge status: Home / Self Care | Payer: Medicare HMO | Source: Ambulatory Visit | Attending: Hematology and Oncology | Admitting: Hematology and Oncology

## 2020-02-23 DIAGNOSIS — C7802 Secondary malignant neoplasm of left lung: Secondary | ICD-10-CM | POA: Diagnosis present

## 2020-02-23 DIAGNOSIS — C20 Malignant neoplasm of rectum: Secondary | ICD-10-CM | POA: Diagnosis present

## 2020-02-23 MED ORDER — IOHEXOL 300 MG/ML  SOLN
100.0000 mL | Freq: Once | INTRAMUSCULAR | Status: AC | PRN
  Administered 2020-02-23: 100 mL via INTRAVENOUS

## 2020-02-24 ENCOUNTER — Inpatient Hospital Stay (HOSPITAL_BASED_OUTPATIENT_CLINIC_OR_DEPARTMENT_OTHER): Payer: Medicare HMO | Admitting: Hematology and Oncology

## 2020-02-24 ENCOUNTER — Encounter: Payer: Self-pay | Admitting: Hematology and Oncology

## 2020-02-24 VITALS — BP 151/73 | HR 93 | Temp 96.5°F | Resp 18 | Wt 192.1 lb

## 2020-02-24 DIAGNOSIS — Z7189 Other specified counseling: Secondary | ICD-10-CM | POA: Diagnosis not present

## 2020-02-24 DIAGNOSIS — C7802 Secondary malignant neoplasm of left lung: Secondary | ICD-10-CM

## 2020-02-24 DIAGNOSIS — C20 Malignant neoplasm of rectum: Secondary | ICD-10-CM | POA: Diagnosis not present

## 2020-02-24 DIAGNOSIS — E538 Deficiency of other specified B group vitamins: Secondary | ICD-10-CM

## 2020-02-24 NOTE — Patient Instructions (Signed)
Bevacizumab injection What is this medicine? BEVACIZUMAB (be va SIZ yoo mab) is a monoclonal antibody. It is used to treat many types of cancer. This medicine may be used for other purposes; ask your health care provider or pharmacist if you have questions. COMMON BRAND NAME(S): Avastin, MVASI, Zirabev What should I tell my health care provider before I take this medicine? They need to know if you have any of these conditions:  diabetes  heart disease  high blood pressure  history of coughing up blood  prior anthracycline chemotherapy (e.g., doxorubicin, daunorubicin, epirubicin)  recent or ongoing radiation therapy  recent or planning to have surgery  stroke  an unusual or allergic reaction to bevacizumab, hamster proteins, mouse proteins, other medicines, foods, dyes, or preservatives  pregnant or trying to get pregnant  breast-feeding How should I use this medicine? This medicine is for infusion into a vein. It is given by a health care professional in a hospital or clinic setting. Talk to your pediatrician regarding the use of this medicine in children. Special care may be needed. Overdosage: If you think you have taken too much of this medicine contact a poison control center or emergency room at once. NOTE: This medicine is only for you. Do not share this medicine with others. What if I miss a dose? It is important not to miss your dose. Call your doctor or health care professional if you are unable to keep an appointment. What may interact with this medicine? Interactions are not expected. This list may not describe all possible interactions. Give your health care provider a list of all the medicines, herbs, non-prescription drugs, or dietary supplements you use. Also tell them if you smoke, drink alcohol, or use illegal drugs. Some items may interact with your medicine. What should I watch for while using this medicine? Your condition will be monitored carefully  while you are receiving this medicine. You will need important blood work and urine testing done while you are taking this medicine. This medicine may increase your risk to bruise or bleed. Call your doctor or health care professional if you notice any unusual bleeding. Before having surgery, talk to your health care provider to make sure it is ok. This drug can increase the risk of poor healing of your surgical site or wound. You will need to stop this drug for 28 days before surgery. After surgery, wait at least 28 days before restarting this drug. Make sure the surgical site or wound is healed enough before restarting this drug. Talk to your health care provider if questions. Do not become pregnant while taking this medicine or for 6 months after stopping it. Women should inform their doctor if they wish to become pregnant or think they might be pregnant. There is a potential for serious side effects to an unborn child. Talk to your health care professional or pharmacist for more information. Do not breast-feed an infant while taking this medicine and for 6 months after the last dose. This medicine has caused ovarian failure in some women. This medicine may interfere with the ability to have a child. You should talk to your doctor or health care professional if you are concerned about your fertility. What side effects may I notice from receiving this medicine? Side effects that you should report to your doctor or health care professional as soon as possible:  allergic reactions like skin rash, itching or hives, swelling of the face, lips, or tongue  chest pain or chest tightness  chills  coughing up blood  high fever  seizures  severe constipation  signs and symptoms of bleeding such as bloody or black, tarry stools; red or dark-brown urine; spitting up blood or brown material that looks like coffee grounds; red spots on the skin; unusual bruising or bleeding from the eye, gums, or  nose  signs and symptoms of a blood clot such as breathing problems; chest pain; severe, sudden headache; pain, swelling, warmth in the leg  signs and symptoms of a stroke like changes in vision; confusion; trouble speaking or understanding; severe headaches; sudden numbness or weakness of the face, arm or leg; trouble walking; dizziness; loss of balance or coordination  stomach pain  sweating  swelling of legs or ankles  vomiting  weight gain Side effects that usually do not require medical attention (report to your doctor or health care professional if they continue or are bothersome):  back pain  changes in taste  decreased appetite  dry skin  nausea  tiredness This list may not describe all possible side effects. Call your doctor for medical advice about side effects. You may report side effects to FDA at 1-800-FDA-1088. Where should I keep my medicine? This drug is given in a hospital or clinic and will not be stored at home. NOTE: This sheet is a summary. It may not cover all possible information. If you have questions about this medicine, talk to your doctor, pharmacist, or health care provider.  2020 Elsevier/Gold Standard (2019-07-21 10:50:46)   Irinotecan injection What is this medicine? IRINOTECAN (ir in oh TEE kan ) is a chemotherapy drug. It is used to treat colon and rectal cancer. This medicine may be used for other purposes; ask your health care provider or pharmacist if you have questions. COMMON BRAND NAME(S): Camptosar What should I tell my health care provider before I take this medicine? They need to know if you have any of these conditions:  dehydration  diarrhea  infection (especially a virus infection such as chickenpox, cold sores, or herpes)  liver disease  low blood counts, like low white cell, platelet, or red cell counts  low levels of calcium, magnesium, or potassium in the blood  recent or ongoing radiation therapy  an unusual  or allergic reaction to irinotecan, other medicines, foods, dyes, or preservatives  pregnant or trying to get pregnant  breast-feeding How should I use this medicine? This drug is given as an infusion into a vein. It is administered in a hospital or clinic by a specially trained health care professional. Talk to your pediatrician regarding the use of this medicine in children. Special care may be needed. Overdosage: If you think you have taken too much of this medicine contact a poison control center or emergency room at once. NOTE: This medicine is only for you. Do not share this medicine with others. What if I miss a dose? It is important not to miss your dose. Call your doctor or health care professional if you are unable to keep an appointment. What may interact with this medicine? This medicine may interact with the following medications:  antiviral medicines for HIV or AIDS  certain antibiotics like rifampin or rifabutin  certain medicines for fungal infections like itraconazole, ketoconazole, posaconazole, and voriconazole  certain medicines for seizures like carbamazepine, phenobarbital, phenotoin  clarithromycin  gemfibrozil  nefazodone  St. John's Wort This list may not describe all possible interactions. Give your health care provider a list of all the medicines, herbs, non-prescription drugs, or  dietary supplements you use. Also tell them if you smoke, drink alcohol, or use illegal drugs. Some items may interact with your medicine. What should I watch for while using this medicine? Your condition will be monitored carefully while you are receiving this medicine. You will need important blood work done while you are taking this medicine. This drug may make you feel generally unwell. This is not uncommon, as chemotherapy can affect healthy cells as well as cancer cells. Report any side effects. Continue your course of treatment even though you feel ill unless your doctor  tells you to stop. In some cases, you may be given additional medicines to help with side effects. Follow all directions for their use. You may get drowsy or dizzy. Do not drive, use machinery, or do anything that needs mental alertness until you know how this medicine affects you. Do not stand or sit up quickly, especially if you are an older patient. This reduces the risk of dizzy or fainting spells. Call your health care professional for advice if you get a fever, chills, or sore throat, or other symptoms of a cold or flu. Do not treat yourself. This medicine decreases your body's ability to fight infections. Try to avoid being around people who are sick. Avoid taking products that contain aspirin, acetaminophen, ibuprofen, naproxen, or ketoprofen unless instructed by your doctor. These medicines may hide a fever. This medicine may increase your risk to bruise or bleed. Call your doctor or health care professional if you notice any unusual bleeding. Be careful brushing and flossing your teeth or using a toothpick because you may get an infection or bleed more easily. If you have any dental work done, tell your dentist you are receiving this medicine. Do not become pregnant while taking this medicine or for 6 months after stopping it. Women should inform their health care professional if they wish to become pregnant or think they might be pregnant. Men should not father a child while taking this medicine and for 3 months after stopping it. There is potential for serious side effects to an unborn child. Talk to your health care professional for more information. Do not breast-feed an infant while taking this medicine or for 7 days after stopping it. This medicine has caused ovarian failure in some women. This medicine may make it more difficult to get pregnant. Talk to your health care professional if you are concerned about your fertility. This medicine has caused decreased sperm counts in some men. This  may make it more difficult to father a child. Talk to your health care professional if you are concerned about your fertility. What side effects may I notice from receiving this medicine? Side effects that you should report to your doctor or health care professional as soon as possible:  allergic reactions like skin rash, itching or hives, swelling of the face, lips, or tongue  chest pain  diarrhea  flushing, runny nose, sweating during infusion  low blood counts - this medicine may decrease the number of white blood cells, red blood cells and platelets. You may be at increased risk for infections and bleeding.  nausea, vomiting  pain, swelling, warmth in the leg  signs of decreased platelets or bleeding - bruising, pinpoint red spots on the skin, black, tarry stools, blood in the urine  signs of infection - fever or chills, cough, sore throat, pain or difficulty passing urine  signs of decreased red blood cells - unusually weak or tired, fainting spells, lightheadedness Side  effects that usually do not require medical attention (report to your doctor or health care professional if they continue or are bothersome):  constipation  hair loss  headache  loss of appetite  mouth sores  stomach pain This list may not describe all possible side effects. Call your doctor for medical advice about side effects. You may report side effects to FDA at 1-800-FDA-1088. Where should I keep my medicine? This drug is given in a hospital or clinic and will not be stored at home. NOTE: This sheet is a summary. It may not cover all possible information. If you have questions about this medicine, talk to your doctor, pharmacist, or health care provider.  2020 Elsevier/Gold Standard (2018-11-13 10:09:17)  Fluorouracil, 5-FU injection What is this medicine? FLUOROURACIL, 5-FU (flure oh YOOR a sil) is a chemotherapy drug. It slows the growth of cancer cells. This medicine is used to treat many  types of cancer like breast cancer, colon or rectal cancer, pancreatic cancer, and stomach cancer. This medicine may be used for other purposes; ask your health care provider or pharmacist if you have questions. COMMON BRAND NAME(S): Adrucil What should I tell my health care provider before I take this medicine? They need to know if you have any of these conditions:  blood disorders  dihydropyrimidine dehydrogenase (DPD) deficiency  infection (especially a virus infection such as chickenpox, cold sores, or herpes)  kidney disease  liver disease  malnourished, poor nutrition  recent or ongoing radiation therapy  an unusual or allergic reaction to fluorouracil, other chemotherapy, other medicines, foods, dyes, or preservatives  pregnant or trying to get pregnant  breast-feeding How should I use this medicine? This drug is given as an infusion or injection into a vein. It is administered in a hospital or clinic by a specially trained health care professional. Talk to your pediatrician regarding the use of this medicine in children. Special care may be needed. Overdosage: If you think you have taken too much of this medicine contact a poison control center or emergency room at once. NOTE: This medicine is only for you. Do not share this medicine with others. What if I miss a dose? It is important not to miss your dose. Call your doctor or health care professional if you are unable to keep an appointment. What may interact with this medicine?  allopurinol  cimetidine  dapsone  digoxin  hydroxyurea  leucovorin  levamisole  medicines for seizures like ethotoin, fosphenytoin, phenytoin  medicines to increase blood counts like filgrastim, pegfilgrastim, sargramostim  medicines that treat or prevent blood clots like warfarin, enoxaparin, and dalteparin  methotrexate  metronidazole  pyrimethamine  some other chemotherapy drugs like busulfan, cisplatin, estramustine,  vinblastine  trimethoprim  trimetrexate  vaccines Talk to your doctor or health care professional before taking any of these medicines:  acetaminophen  aspirin  ibuprofen  ketoprofen  naproxen This list may not describe all possible interactions. Give your health care provider a list of all the medicines, herbs, non-prescription drugs, or dietary supplements you use. Also tell them if you smoke, drink alcohol, or use illegal drugs. Some items may interact with your medicine. What should I watch for while using this medicine? Visit your doctor for checks on your progress. This drug may make you feel generally unwell. This is not uncommon, as chemotherapy can affect healthy cells as well as cancer cells. Report any side effects. Continue your course of treatment even though you feel ill unless your doctor tells you to  stop. In some cases, you may be given additional medicines to help with side effects. Follow all directions for their use. Call your doctor or health care professional for advice if you get a fever, chills or sore throat, or other symptoms of a cold or flu. Do not treat yourself. This drug decreases your body's ability to fight infections. Try to avoid being around people who are sick. This medicine may increase your risk to bruise or bleed. Call your doctor or health care professional if you notice any unusual bleeding. Be careful brushing and flossing your teeth or using a toothpick because you may get an infection or bleed more easily. If you have any dental work done, tell your dentist you are receiving this medicine. Avoid taking products that contain aspirin, acetaminophen, ibuprofen, naproxen, or ketoprofen unless instructed by your doctor. These medicines may hide a fever. Do not become pregnant while taking this medicine. Women should inform their doctor if they wish to become pregnant or think they might be pregnant. There is a potential for serious side effects to an  unborn child. Talk to your health care professional or pharmacist for more information. Do not breast-feed an infant while taking this medicine. Men should inform their doctor if they wish to father a child. This medicine may lower sperm counts. Do not treat diarrhea with over the counter products. Contact your doctor if you have diarrhea that lasts more than 2 days or if it is severe and watery. This medicine can make you more sensitive to the sun. Keep out of the sun. If you cannot avoid being in the sun, wear protective clothing and use sunscreen. Do not use sun lamps or tanning beds/booths. What side effects may I notice from receiving this medicine? Side effects that you should report to your doctor or health care professional as soon as possible:  allergic reactions like skin rash, itching or hives, swelling of the face, lips, or tongue  low blood counts - this medicine may decrease the number of white blood cells, red blood cells and platelets. You may be at increased risk for infections and bleeding.  signs of infection - fever or chills, cough, sore throat, pain or difficulty passing urine  signs of decreased platelets or bleeding - bruising, pinpoint red spots on the skin, black, tarry stools, blood in the urine  signs of decreased red blood cells - unusually weak or tired, fainting spells, lightheadedness  breathing problems  changes in vision  chest pain  mouth sores  nausea and vomiting  pain, swelling, redness at site where injected  pain, tingling, numbness in the hands or feet  redness, swelling, or sores on hands or feet  stomach pain  unusual bleeding Side effects that usually do not require medical attention (report to your doctor or health care professional if they continue or are bothersome):  changes in finger or toe nails  diarrhea  dry or itchy skin  hair loss  headache  loss of appetite  sensitivity of eyes to the light  stomach  upset  unusually teary eyes This list may not describe all possible side effects. Call your doctor for medical advice about side effects. You may report side effects to FDA at 1-800-FDA-1088. Where should I keep my medicine? This drug is given in a hospital or clinic and will not be stored at home. NOTE: This sheet is a summary. It may not cover all possible information. If you have questions about this medicine, talk to your  doctor, pharmacist, or health care provider.  2020 Elsevier/Gold Standard (2008-01-27 13:53:16)

## 2020-02-24 NOTE — Progress Notes (Signed)
Patient denies new problems/concerns today.   °

## 2020-02-25 ENCOUNTER — Encounter: Payer: Self-pay | Admitting: Hematology and Oncology

## 2020-02-28 ENCOUNTER — Other Ambulatory Visit: Payer: Self-pay | Admitting: Hematology and Oncology

## 2020-02-28 DIAGNOSIS — E538 Deficiency of other specified B group vitamins: Secondary | ICD-10-CM

## 2020-03-03 ENCOUNTER — Other Ambulatory Visit: Payer: Self-pay | Admitting: Hematology and Oncology

## 2020-03-03 ENCOUNTER — Inpatient Hospital Stay: Payer: Medicare HMO

## 2020-03-03 DIAGNOSIS — E876 Hypokalemia: Secondary | ICD-10-CM

## 2020-03-03 NOTE — Telephone Encounter (Signed)
...    Ref Range & Units 3 wk ago  (02/10/20) 3 mo ago  (12/03/19) 3 mo ago  (11/15/19)  Potassium 3.5 - 5.1 mmol/L 3.2Low   3.5  3.2Low

## 2020-03-07 NOTE — Progress Notes (Signed)
[facility]  [address], [address] [place], [place] [address] Phone: [phone]  Fax: [phone]   Clinic Day:  03/08/2020  Referring physician: Loni Muse, MD  Chief Complaint: Tamara Hall is a 64 y.o. female with stage IV rectal cancer s/p microwave hepatic ablation, APR with colostomy, and VATS wedge resectionwho is seen for assessment prior to initiation of cycle #1 FOLFIRI +/- Avastin.   HPI: The patient was last seen in the medical oncology clinic on 02/24/2020. At that time, she was feeling "better than yesterday."  Chest, abdomen and pelvis CT revealed a stable 2.6 x 1.7 cm partially calcified left infrahilar pulmonary nodule.  There were new no new or enlarging pulmonary nodules.  There was stable treated hepatic metastatic disease with no evidence of progression or local recurrence. She was not interested in surgery. We discussed FOLFIRI +/- Avastin. Dental work was planned on 03/20/2020.  Symptomatically, she is feeling good today. She says her stomach is good. She denies any blood in her stools and black stools. Her bowel movements are normal. The last time she received chemotherapy was in July 2018.  At that time, she received FOLFIRI with Neulasta support.    She denies any other symptoms. She has not been using the EMLA cream for her port. She is hoping she doesn't have to have any teeth pulled out, however expects she may have to.  Dr Willette Alma front office was unsure about the amount of dental work needed on 03/20/2020.  A release of information was requested.  She had been taking one potassium pill twice a day x 7 days and on the 8th day she began taking one a day.  She says she has about 2 weeks worth left of her potassium pills.    Past Medical History:  Diagnosis Date  . Cancer ([place])    colon, with mets to liver and left lung  . Hypertension   . Pneumonia    20+ years ago    Past Surgical History:  Procedure Laterality Date  .  ABDOMINAL HYSTERECTOMY    . BREAST SURGERY    . COLON SURGERY  10/2017   [facility]  . COLONOSCOPY WITH PROPOFOL N/A 01/14/2017   Procedure: COLONOSCOPY WITH PROPOFOL;  Surgeon: Lucilla Lame, MD;  Location: [facility] ENDOSCOPY;  Service: Endoscopy;  Laterality: N/A;  . ELBOW SURGERY Right    Has had 4 surgeries on right elbow.  Marland Kitchen HAND SURGERY Right 2008  . JOINT REPLACEMENT    . LIVER BIOPSY  10/2017  . lung wedge resection  10/2017  . PORTA CATH INSERTION N/A 01/29/2017   Procedure: Glori Luis Cath Insertion;  Surgeon: Algernon Huxley, MD;  Location: [facility];  Service: Cardiovascular;  Laterality: N/A;  . TONSILLECTOMY      Family History  Problem Relation Age of Onset  . Lung cancer Mother 59  . Esophageal cancer Father 37  . Brain cancer Maternal Grandmother     Social History:  reports that she quit smoking about 2 years ago. Her smoking use included cigarettes. She has a 80.00 pack-year smoking history. She has never used smokeless tobacco. She reports that she does not drink alcohol or use drugs. She was smoking 1/2 packs/day.She lives in [place]. She has 3 children (ages 55, 29, and 22). She smoked 2 packs a day. She started smoking at age 44. Stopped smoking on 10/24/2017. 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 She denies any exposure to  radiation or toxins. [name] is her significant other. The patient is alone today.  Allergies: No Known Allergies  Current Medications: Current Outpatient Medications  Medication Sig Dispense Refill  . albuterol (VENTOLIN HFA) 108 (90 Base) MCG/ACT inhaler Inhale into the lungs.    Marland Kitchen amLODipine (NORVASC) 5 MG tablet Take 5 mg by mouth daily.    . baclofen (LIORESAL) 10 MG tablet Take 10 mg by mouth 3 (three) times daily.    . folic acid (FOLVITE) 1 MG tablet TAKE 1 TABLET BY MOUTH EVERY DAY 90 tablet 1  . gabapentin (NEURONTIN) 300 MG capsule TAKE 1 CAPSULE BY MOUTH THREE TIMES A DAY 90 capsule 0  . gabapentin (NEURONTIN) 300 MG capsule  Take 1,200 mg by mouth 3 (three) times daily.     . hydrochlorothiazide (HYDRODIURIL) 25 MG tablet Take 25 mg by mouth daily.    Marland Kitchen HYDROcodone-acetaminophen (NORCO/VICODIN) 5-325 MG tablet Take 1 tablet by mouth every 6 (six) hours as needed for moderate pain. 15 tablet 0  . nortriptyline (PAMELOR) 10 MG capsule Take 10 mg by mouth at bedtime.    . ondansetron (ZOFRAN ODT) 8 MG disintegrating tablet Take 1 tablet (8 mg total) by mouth every 8 (eight) hours as needed for nausea or vomiting. 20 tablet 1  . Ostomy Supplies (PREMIER DRAINABLE POUCH 64MM) Pouch MISC     . potassium chloride (KLOR-CON M10) 10 MEQ tablet Take 1 tablet (10 mEq total) by mouth daily. 60 tablet 1  . LORazepam (ATIVAN) 0.5 MG tablet Take one tablet (0.5 mg) by mouth 30 minutes prior to the procedure.  Patient must have a driver. (Patient not taking: Reported on 02/09/2020) 2 tablet 0   No current facility-administered medications for this visit.   Facility-Administered Medications Ordered in Other Visits  Medication Dose Route Frequency Provider Last Rate Last Admin  . heparin lock flush 100 unit/mL  500 Units Intravenous Once Arizbeth Cawthorn C, MD      . sodium chloride flush (NS) 0.9 % injection 10 mL  10 mL Intravenous Once Lequita Asal, MD        Review of Systems  Constitutional: Positive for weight loss (5 lb). Negative for chills, diaphoresis, fever and malaise/fatigue.       Feeling "good".   HENT: Negative for congestion, ear pain, nosebleeds, sinus pain, sore throat and tinnitus.        Dental work planned on 03/20/2020.  Eyes: Negative.  Negative for blurred vision and double vision.  Respiratory: Negative.  Negative for cough, sputum production and shortness of breath.   Cardiovascular: Negative for chest pain, palpitations and leg swelling.  Gastrointestinal: Negative for abdominal pain (intermittent labor-like pain occurring up to 4 days at a time), constipation, diarrhea, heartburn, nausea and  vomiting.       Colostomy s/p APR.   Genitourinary: Negative.  Negative for dysuria, frequency and urgency.  Musculoskeletal: Negative.  Negative for back pain, falls, joint pain, myalgias and neck pain.  Skin: Negative.  Negative for itching and rash.  Neurological: Negative.  Negative for dizziness, sensory change, speech change, weakness and headaches.  Endo/Heme/Allergies: Negative.  Does not bruise/bleed easily.  Psychiatric/Behavioral: Negative.  Negative for depression and memory loss. The patient is not nervous/anxious and does not have insomnia.    Performance status (ECOG): 1 - Symptomatic but completely ambulatory  Vitals Blood pressure (!) 144/69, pulse 90, temperature (!) 96.1 F (35.6 C), temperature source Tympanic, resp. rate 18, weight 187 lb (84.8 kg), SpO2 97 %.  Physical Exam  Constitutional: She is oriented to person, place, and time. She appears well-developed and well-nourished. No distress. Face mask in place.  She has a rolling walker by her side.   HENT:  Head: Normocephalic and atraumatic.  Mouth/Throat: Oropharynx is clear and moist and mucous membranes are normal. No oral lesions.  Long blonde/gray hair pulled back.   Eyes: Pupils are equal, round, and reactive to light. Conjunctivae and EOM are normal. Right eye exhibits no discharge. Left eye exhibits no discharge. No scleral icterus.  Blue eyes.  Neck: No JVD present.  Cardiovascular: Normal rate, regular rhythm and normal heart sounds. Exam reveals no gallop and no friction rub.  No murmur heard. Pulmonary/Chest: Effort normal and breath sounds normal. No respiratory distress. She has no wheezes. She has no rhonchi. She has no rales. She exhibits no tenderness.  Abdominal: Soft. Normal appearance and bowel sounds are normal. She exhibits no distension and no mass. There is no hepatosplenomegaly. There is no abdominal tenderness. There is no rebound, no guarding and no CVA tenderness.  Colostomy site  unremarkable.  Musculoskeletal:        General: No tenderness or edema. Normal range of motion.     Cervical back: Normal range of motion and neck supple.  Lymphadenopathy:       Head (right side): No preauricular, no posterior auricular and no occipital adenopathy present.       Head (left side): No preauricular, no posterior auricular and no occipital adenopathy present.    She has no cervical adenopathy.       Right cervical: No superficial cervical adenopathy present.      Left cervical: No superficial cervical adenopathy present.    She has no axillary adenopathy.       Right: No inguinal and no supraclavicular adenopathy present.       Left: No inguinal and no supraclavicular adenopathy present.  Neurological: She is alert and oriented to person, place, and time.  Skin: Skin is warm, dry and intact. No bruising, no lesion and no rash noted. She is not diaphoretic. No erythema. No pallor.  Psychiatric: She has a normal mood and affect. Her behavior is normal. Judgment and thought content normal.  Nursing note and vitals reviewed.   Imaging studies: 01/13/2017:Abdomen and pelvic CTrevealed irregular thickening of the rectum suspicious for carcinoma. There was a 1.6 cm hypoattenuating lesion in the right hepatic lobe worrisome for metastatic disease. 01/15/2017:Chest CT revealed a 9 mm noncalcified left lower lobe pulmonary nodule (new). 01/16/2017:Pelvic MRIrevealed rectal adenocarcinoma T3N1. There was extension beyond the muscularis propria (17 mm). There were mesorectal nodes >= 5 mm. There was adenopathy on the left side of the mesorectum, including a 9 mm lesion. The distance from the tumor to the anal sphincter was 8 mm. 01/27/2017:PET scanrevealed intense metabolic activity associated rectal mass consists with primary rectal carcinoma. There was a hypermetabolic metastatic lesion to the central LEFT hepatic lobe. There was metabolic activity associated with small  LEFT lower lobe nodule which was most consistent with pulmonary metastasis. 01/31/2017:Liver MRIrevealed a 2.6 x 2.7 x 2.1 cm lesion in segment VIII of the liver near the junction with segment IVa c/w a metastatic lesion. There was a 1.1 x 0.8 cm suspected metastatic lesion peripherally in segment VI of the liver.  05/16/2017:Abdomen and pelvic CTrevealed persistent but decreased asymmetric wall thickening in the rectum. The medial segment left liver lesion was smaller (2.7 x 2.6 cm to 1.9 x 1.6 cm). A tiny  lesion seen in segment VI of the liver on previous MRI was not evident on today's CT scan. The left lower lobe pulmonary nodule was smaller (9 mm to 6 mm). There was no new or progressive findings in the abdomen and pelvis. 08/19/2017:Chest CTwith contrast on 08/19/2017 revealed left lower lobe 6 mm solid pulmonary nodule, decreased since 01/15/2017 chest CT. Stable 7 mm right lower lobe pulmonary nodule. There was no new or progressive metastatic disease in the chest.  09/03/2018: Pelvic MRI at [facility] demonstrated a partial response of the primary tumor and extramural disease. Post treatment category ymrT T3b. There was an overall decrease in size in the previously seen perirectal node. Hypointense changes at the site of prior tumor focally contacedt buts does not definitely invade the LEFT levator ani. Tumor contacted, but does not definitely invade the internal anal sphincter. No evidence of residual or recurrent hepatic disease following microwave ablation. 02/17/2018:Abdomen and pelvic CTrevealed evolution of the ablation zone in segment 4A, now measuring 3.7 x 3.1 cm, decreased.There was no findings to suggest viable tumor.She is s/pabdominoperineal resection with left lower quadrant colostomy. There was no evidence of new/progressive metastatic disease.She is s/pleft lower lobe pulmonary wedge resection, incompletely visualized. 03/04/2018:Chest CTrevealed a stable chest  CT (compared to11/13/2018). The index right lower lobe lung nodule measured7 mm (unchanged). There was no new or progressive nodularity identified. There was continued decrease in size of ablation defect within segment 4a of the liver. 08/13/2019:[facility] parastomal hernia with small bowel as well as multiple small bowel loops whichwere dilated.There was hepatic steatosis and post-treatment changes in the pelvis with persistent presacral soft tissue. There was a 3.5 cm infrarenal abdominal aortic aneurysm. 09/09/2019:Chest CT at [facility] no intrathoracic metastasis and no new or enlarging pulmonary nodules. 09/09/2019:Abdomen and pelvis CT at [facility] multiple subcentimeter hypodensities in the dome of the liver, which appeared new/more conspicuous from prior CT. There were not seen on the prior MRI. Further evaluation with MRI was recommended. CT evaluationwas limited due to background fatty liver and the heterogeneity/these lesions could have been secondary to fatty liver. There was unchanged soft tissue stranding and nodularity in the pelvis, with a few prominent more nodular areas. There were a few more prominent indeterminateareasand short-term follow up was recommended to ensure stability and exclude peritoneal disease. Further evaluation with MRI could have been helpful. 11/12/2019: Liver and pelvis MRIrevealedno findings to explain the patient's history of rising CEA. There was irregular presacral soft tissue with apparent scarring noted in the left paracentral pelviswas similar to prior studies and presumably treatment related. PET-CT could be used, as clinically warranted to exclude hypermetabolic disease within thatregion. There was an ablation defect identified in segment IV with minimal irregular enhancement along the anterosuperior margin, indeterminate. Attention on follow-up recommended.There was a tiny cyst identified in  the dome of the right liver without other findings to suggest metastatic disease in the hepatic parenchyma. 11/29/2019:PET scanrevealed 1.6 x 1.5 cmnodularthickeningat the resection margin (SUV 11.3)in the LEFT lower lobe. There was no evidence of hepatic metastasis. There was no evidence of local recurrence in pelvis. There was insufficiency fracture in the LEFT sacral ala presumably related to radiation treatment. 02/23/2020:  Chest, abdomen and pelvis CT revealed a stable 2.6 x 1.7 cm partially calcified left infrahilar pulmonary nodule, hypermetabolic on PET scan and likely metastasis.  There were new no new or enlarging pulmonary nodules.  There were stable treated hepatic metastatic disease with no evidence of progression or local recurrence.  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  There is no evidence of incarceration or obstruction.  There was mild wall thickening of multiple loops of small bowel in the mid abdomen, possibly related to prior radiation therapy.  There was stable chronic sacral insufficiency fractures bilaterally.  There was a stable 3.7 cm fusiform infrarenal abdominal aortic aneurysm.     Infusion on 03/08/2020  Component Date Value Ref Range Status  . Color, Urine 03/08/2020 YELLOW  YELLOW Final  . APPearance 03/08/2020 CLEAR  CLEAR Final  . Specific Gravity, Urine 03/08/2020 1.025  1.005 - 1.030 Final  . pH 03/08/2020 5.0  5.0 - 8.0 Final  . Glucose, UA 03/08/2020 NEGATIVE  NEGATIVE mg/dL Final  . Hgb urine dipstick 03/08/2020 NEGATIVE  NEGATIVE Final  . Bilirubin Urine 03/08/2020 NEGATIVE  NEGATIVE Final  . Ketones, ur 03/08/2020 NEGATIVE  NEGATIVE mg/dL Final  . Protein, ur 03/08/2020 NEGATIVE  NEGATIVE mg/dL Final  . Nitrite 03/08/2020 NEGATIVE  NEGATIVE Final  . Chalmers Guest 03/08/2020 NEGATIVE  NEGATIVE Final   Performed at [facility], [address] [place], [place] [address]  . Sodium  03/08/2020 134* 135 - 145 mmol/L Final  . Potassium 03/08/2020 3.1* 3.5 - 5.1 mmol/L Final  . Chloride 03/08/2020 95* 98 - 111 mmol/L Final  . CO2 03/08/2020 28  22 - 32 mmol/L Final  . Glucose, Bld 03/08/2020 144* 70 - 99 mg/dL Final   Glucose reference range applies only to samples taken after fasting for at least 8 hours.  . BUN 03/08/2020 11  8 - 23 mg/dL Final  . Creatinine, Ser 03/08/2020 0.85  0.44 - 1.00 mg/dL Final  . Calcium 03/08/2020 8.9  8.9 - 10.3 mg/dL Final  . Total Protein 03/08/2020 7.2  6.5 - 8.1 g/dL Final  . Albumin 03/08/2020 3.9  3.5 - 5.0 g/dL Final  . AST 03/08/2020 20  15 - 41 U/L Final  . ALT 03/08/2020 13  0 - 44 U/L Final  . Alkaline Phosphatase 03/08/2020 100  38 - 126 U/L Final  . Total Bilirubin 03/08/2020 0.5  0.3 - 1.2 mg/dL Final  . GFR calc non Af Amer 03/08/2020 >60  >60 mL/min Final  . GFR calc Af Amer 03/08/2020 >60  >60 mL/min Final  . Anion gap 03/08/2020 11  5 - 15 Final   Performed at [facility], [address], [place], [place] [address]  . WBC 03/08/2020 5.9  4.0 - 10.5 K/uL Final  . RBC 03/08/2020 4.55  3.87 - 5.11 MIL/uL Final  . Hemoglobin 03/08/2020 14.2  12.0 - 15.0 g/dL Final  . HCT 03/08/2020 41.7  36.0 - 46.0 % Final  . MCV 03/08/2020 91.6  80.0 - 100.0 fL Final  . MCH 03/08/2020 31.2  26.0 - 34.0 pg Final  . MCHC 03/08/2020 34.1  30.0 - 36.0 g/dL Final  . RDW 03/08/2020 12.9  11.5 - 15.5 % Final  . Platelets 03/08/2020 278  150 - 400 K/uL Final  . nRBC 03/08/2020 0.0  0.0 - 0.2 % Final  . Neutrophils Relative % 03/08/2020 68  % Final  . Neutro Abs 03/08/2020 4.0  1.7 - 7.7 K/uL Final  . Lymphocytes Relative 03/08/2020 23  % Final  . Lymphs Abs 03/08/2020 1.3  0.7 - 4.0 K/uL Final  . Monocytes Relative 03/08/2020 7  % Final  . Monocytes Absolute 03/08/2020 0.4  0.1 - 1.0 K/uL Final  . Eosinophils  Relative 03/08/2020 1  % Final  . Eosinophils Absolute 03/08/2020 0.1  0.0 - 0.5 K/uL Final  . Basophils Relative  03/08/2020 0  % Final  . Basophils Absolute 03/08/2020 0.0  0.0 - 0.1 K/uL Final  . Immature Granulocytes 03/08/2020 1  % Final  . Abs Immature Granulocytes 03/08/2020 0.04  0.00 - 0.07 K/uL Final   Performed at [facility], [address], [place], [place] [address]    Assessment:  AMBRI MILTNER is a 64 y.o. female with clinical stage T3N1M1rectal cancer. She presented with a 9 month history of rectal bleeding. Colonoscopyon 01/14/2017 revealed afrond-like/villous non-obstructing large mass in the rectum. The mass was non-circumferential. Biopsies revealed high grade dysplasia within an adenoma (? sampling error). CEAwas 31.6 on 01/15/2017.  PET scanon 01/27/2017 revealed intense metabolic activity associated rectal mass consists with primary rectal carcinoma. There was a hypermetabolic metastatic lesion to the central LEFT hepatic lobe. There was metabolic activity associated with small LEFT lower lobe nodule which was most consistent with pulmonary metastasis.  CT guided liver biopsyat [facility] on 02/07/2017 revealed malignant cells c/w metastatic colorectal adenocarcinoma. [facility] on 12/29/2019 [phone]) revealed MS-stable, 1 Muts/Mb tumor mutational burden; KRAS G12C, PTEN rearrangement intron 8, APC G1106*, T1454f*[id number], and NRAS wildtype.  There were no reportable mutations in BRAF and NRAS.     CEAhas been followed: 31.6 on 01/15/2017, 38.1 on 02/18/2017, 25.1 on 03/18/2017, 11.8 on 04/08/2017, 9.2 on 04/22/2017, 9.5 on 05/20/2017, 9.6 on 06/19/2017, 3.7 on 02/18/2018,5.8 on 06/26/2018, 10.5 on 11/20/2018, 19.5 on 03/26/2019, 34.0 on 07/21/2019, 46.4 on 09/09/2019, 54.4 on 10/28/2019, and 108.0 on 02/10/2020.  She received 2 cycles of FOLFOX (02/18/2017 - 03/04/2017). She developed transient cold induced neuropathy. She developed wheezing and hypoxia with cycle #2 felt secondary to oxaliplatin. She received 4 cycles ofFOLFIRI(03/24/2017 -  04/22/2017) with Neulasta support. Diarrhea was controlled with Imodium.  She underwent CT guided microwave ablation of the liver lesionon 06/06/2017.   She began radiationon 06/18/2017. She receiveddaily XMelody Haverradiation. Radiation and Xeloda completed on 08/15/2017.  She underwentAPR with descending colostomy and VRAM flapon 10/24/2017. She underwent a VATS wedge resectionto her LEFT lung. Pathology from the lung demonstrateda 1 cmmetastatic intestinal type adenocarcinoma c/w metastasis from patient's known colorectal carcinoma.Pathology from the rectal resection revealed a 2.1 cm invasive moderately differentiated adenocarcinoma arising in a tubular adenoma with transmural invasion focally into the perirectal soft tissue. There was no lymphovascular or perineural invasion. Margins were negative. Zero of 20 lymph nodes were positive. Final diagnosiswas [id number] adenocarcinoma with metastasis to left lower lobe of the lung.  [facility] One on 12/29/2019 revealed MS-stable, 1 Muts/Mb tumor mutational burden; KRAS G12C, PTEN rearrangement intron 8, APC G1106*, [id number], and NRAS wildtype.  There were no reportable mutations in BRAF and NRAS.  She has had post-operative pain and ostomy ischemia. She underwent surgical ostomy revisionon 11/28/2017. Pathologyshowed focal mucosal erosion and fat necrosis c/w ischemia. There was extensive underlying fat necrosis. No dysplasia or carcinoma was identified. She was treated with oral Augmentin and wound packing. She has been treated with oxycodone 5 mg po q 4 hrs prn, gabapentin 900 mg TID. She was treated with Bactrim on 01/18/2018. She was referred to local pain management clinic on 01/27/2018.  PET scanon 11/29/2019 showed local recurrenceof hypermetabolic nodular lung malignancy at the resection margin(1.6 x 1.5 cm; SUV 11.3)in the LEFT lower lobe. There was no evidence of hepatic metastasis. There was no  evidence of local recurrence in pelvis.  There was insufficiency fracture in the LEFT sacral ala presumably related to radiation treatment.  Chest CTon 12/13/2019 revealed anenlarging partially calcified2.3 x 1.2 cmlesion in the medial aspect of the left lower lobe most compatible with a pulmonary metastasis given the hypermetabolism on recent PET-CT.There were several other tiny pulmonary nodules measuring 6 mm or less in size, some of whichwere new or increased in size compared to prior examinations.  She declined surgical resection.  Chest, abdomen and pelvis CT on 02/23/2020 revealed a stable 2.6 x 1.7 cm partially calcified left infrahilar pulmonary nodule, hypermetabolic on PET scan and likely metastasis.  There were new no new or enlarging pulmonary nodules.  There were stable treated hepatic metastatic disease with no evidence of progression or local recurrence.  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  There is no evidence of incarceration or obstruction.  There was mild wall thickening of multiple loops of small bowel in the mid abdomen, possibly related to prior radiation therapy.  There was stable chronic sacral insufficiency fractures bilaterally.  There was a stable 3.7 cm fusiform infrarenal abdominal aortic aneurysm.   Col[facility]oscopy [facility] 09/13/2019 did not appear malignant with no adenomatous (granulation tissue). There was diverticulosis in the descending colon, normal mucosa in the entire examined colon and the examined portion of the ileum. There was a 9 mmlipoma in the transverse colon anda4 mm polyp at the surgical stoma.   She has a B12 deficiency.[id number] (low)on 12/03/2019. She restarted B12 injections on 12/13/2019 (last 02/10/2020).  She has an 80 pack yearsmoking history. She is smoking.  [facility] for a smallbowel obstruction. She had severe abdominal  pain with associated nausea and vomiting. Abdominal and pelvicCT revealedmultiple small bowel loops whichwere dilated.Obstruction resolved spontaneously.  She does not plan to have the COVID-19 vaccine.   Symptomatically, she feels good.  Exam is stable.  Potassium is 3.1.  Plan: 1.   Labs today:  CBC with diff, CMP, Mg, CEA.  2.Stage IV rectal carcinoma Patient presented with stage IVrectal cancer.  Patientreceived6cycles of FOLFOX(02/18/2017 -04/22/2017).  She underwent CT guided microwave ablation of the liver lesionon 06/06/2017.  She receivedradiationand daily Xeloda (06/18/2017-08/15/2017). She underwentAPR with descending colostomy and VRAM flapon 10/24/2017.  She underwent a VATS wedge resectionto her LEFT lung. PET scan on 02/22/2021revealedrecurrent disease at the lung resection margin. Chest, abdomen, and pelvis CT on 02/23/2020 revealed a 2.6 x 1.7 cm left infrahilar nodule.  Patient declined left lower lobe lobectomy. NexGen sequencingconfirmed a KRAS mutation (not eligible for cetuximab). Review plans for FOLFIRI + Avastin.   Potential side effects reviewed.   Discuss holding Avastin with this cycle secondary to upcoming dental work and possible extraction(s).   Discuss use of Neulasta with chemotherapy as patient required Neulasta with FOLFIRI in 2018.    Preauth Fulphila.   Patient consented to treatment.   Rx:  Decadron, Imodium, and Ativan.   Review no operating heavy machinery or driving while taking Ativan.  Labs reviewed.  Cycle #1 FOLFIRI today.  Discuss symptom management.  She has ondansetron at home to use on a prn bases.  Interventions are adequate.    3. B12 deficiency [id number] (low)on 12/03/2019.             Patient restarted B12 injections on 12/13/2019.             Last B12 on  02/10/2020.  B12 today. 4.   Folate deficiency Folatewas5.8 (low)on 12/03/2019. Patient on folic acid.  Folic acid was 52 on 00/76/2263. 5.Hypokalemia Potassium3.1today. Patient has been on potassium 10 meq a day.  Potassium chloride 20 meq IV today.  Patient to take an additional 20 meq tonight then 20 meq a day.             Check potassium at pump disconnect. 6.   Planned dental work  Avastin on hold for possible extractions.  ROI for dental records. 7.   RTC in 2 days for pump disconnect, labs (potassium) , and +/- Fulphila. 8.   RTC in 1 week for MD assessment, labs (CBC with diff, BMP), and +/- IV potassium.  I discussed the assessment and treatment plan with the patient.  The patient was provided an opportunity to ask questions and all were answered.  The patient agreed with the plan and demonstrated an understanding of the instructions.  The patient was advised to call back if the symptoms worsen or if the condition fails to improve as anticipated.   Lequita Asal, MD, PhD    03/08/2020, 9:24 AM  I, Heywood Footman, am acting as Education administrator for [employer]. Mike Gip, MD, PhD.  I, Sachiko Methot C. Mike Gip, MD, have reviewed the above documentation for accuracy and completeness, and I agree with the above.

## 2020-03-08 ENCOUNTER — Emergency Department
Admission: EM | Admit: 2020-03-08 | Discharge: 2020-03-08 | Disposition: A | Discharge status: Home / Self Care | Payer: Medicare HMO | Attending: Student | Admitting: Student

## 2020-03-08 ENCOUNTER — Encounter: Payer: Self-pay | Admitting: Emergency Medicine

## 2020-03-08 ENCOUNTER — Inpatient Hospital Stay: Payer: Medicare HMO | Attending: Hematology and Oncology

## 2020-03-08 ENCOUNTER — Inpatient Hospital Stay: Payer: Medicare HMO

## 2020-03-08 ENCOUNTER — Inpatient Hospital Stay (HOSPITAL_BASED_OUTPATIENT_CLINIC_OR_DEPARTMENT_OTHER): Payer: Medicare HMO | Admitting: Hematology and Oncology

## 2020-03-08 ENCOUNTER — Ambulatory Visit: Payer: Medicare HMO | Admitting: Hematology and Oncology

## 2020-03-08 ENCOUNTER — Ambulatory Visit: Payer: Medicare HMO

## 2020-03-08 ENCOUNTER — Encounter: Payer: Self-pay | Admitting: Hematology and Oncology

## 2020-03-08 ENCOUNTER — Other Ambulatory Visit: Payer: Medicare HMO

## 2020-03-08 ENCOUNTER — Other Ambulatory Visit: Payer: Self-pay

## 2020-03-08 VITALS — BP 144/69 | HR 90 | Temp 96.1°F | Resp 18 | Wt 187.0 lb

## 2020-03-08 VITALS — BP 154/79 | HR 91

## 2020-03-08 DIAGNOSIS — E876 Hypokalemia: Secondary | ICD-10-CM

## 2020-03-08 DIAGNOSIS — Z5111 Encounter for antineoplastic chemotherapy: Secondary | ICD-10-CM | POA: Insufficient documentation

## 2020-03-08 DIAGNOSIS — I10 Essential (primary) hypertension: Secondary | ICD-10-CM | POA: Diagnosis not present

## 2020-03-08 DIAGNOSIS — E538 Deficiency of other specified B group vitamins: Secondary | ICD-10-CM | POA: Diagnosis not present

## 2020-03-08 DIAGNOSIS — Z79899 Other long term (current) drug therapy: Secondary | ICD-10-CM | POA: Insufficient documentation

## 2020-03-08 DIAGNOSIS — C20 Malignant neoplasm of rectum: Secondary | ICD-10-CM

## 2020-03-08 DIAGNOSIS — Z452 Encounter for adjustment and management of vascular access device: Secondary | ICD-10-CM | POA: Insufficient documentation

## 2020-03-08 DIAGNOSIS — Z85038 Personal history of other malignant neoplasm of large intestine: Secondary | ICD-10-CM | POA: Insufficient documentation

## 2020-03-08 DIAGNOSIS — Z5189 Encounter for other specified aftercare: Secondary | ICD-10-CM | POA: Insufficient documentation

## 2020-03-08 DIAGNOSIS — Z9660 Presence of unspecified orthopedic joint implant: Secondary | ICD-10-CM | POA: Diagnosis not present

## 2020-03-08 DIAGNOSIS — C7802 Secondary malignant neoplasm of left lung: Secondary | ICD-10-CM

## 2020-03-08 DIAGNOSIS — Z87891 Personal history of nicotine dependence: Secondary | ICD-10-CM | POA: Diagnosis not present

## 2020-03-08 LAB — COMPREHENSIVE METABOLIC PANEL
ALT: 13 U/L (ref 0–44)
AST: 20 U/L (ref 15–41)
Albumin: 3.9 g/dL (ref 3.5–5.0)
Alkaline Phosphatase: 100 U/L (ref 38–126)
Anion gap: 11 (ref 5–15)
BUN: 11 mg/dL (ref 8–23)
CO2: 28 mmol/L (ref 22–32)
Calcium: 8.9 mg/dL (ref 8.9–10.3)
Chloride: 95 mmol/L — ABNORMAL LOW (ref 98–111)
Creatinine, Ser: 0.85 mg/dL (ref 0.44–1.00)
GFR calc Af Amer: >60 mL/min (ref >60)
GFR calc non Af Amer: >60 mL/min (ref >60)
Glucose, Bld: 144 mg/dL — ABNORMAL HIGH (ref 70–99)
Potassium: 3.1 mmol/L — ABNORMAL LOW (ref 3.5–5.1)
Sodium: 134 mmol/L — ABNORMAL LOW (ref 135–145)
Total Bilirubin: 0.5 mg/dL (ref 0.3–1.2)
Total Protein: 7.2 g/dL (ref 6.5–8.1)

## 2020-03-08 LAB — CBC WITH DIFFERENTIAL/PLATELET
Abs Immature Granulocytes: 0.04 10*3/uL (ref 0.00–0.07)
Basophils Absolute: 0 10*3/uL (ref 0.0–0.1)
Basophils Relative: 0 %
Eosinophils Absolute: 0.1 10*3/uL (ref 0.0–0.5)
Eosinophils Relative: 1 %
HCT: 41.7 % (ref 36.0–46.0)
Hemoglobin: 14.2 g/dL (ref 12.0–15.0)
Immature Granulocytes: 1 %
Lymphocytes Relative: 23 %
Lymphs Abs: 1.3 10*3/uL (ref 0.7–4.0)
MCH: 31.2 pg (ref 26.0–34.0)
MCHC: 34.1 g/dL (ref 30.0–36.0)
MCV: 91.6 fL (ref 80.0–100.0)
Monocytes Absolute: 0.4 10*3/uL (ref 0.1–1.0)
Monocytes Relative: 7 %
Neutro Abs: 4 10*3/uL (ref 1.7–7.7)
Neutrophils Relative %: 68 %
Platelets: 278 10*3/uL (ref 150–400)
RBC: 4.55 MIL/uL (ref 3.87–5.11)
RDW: 12.9 % (ref 11.5–15.5)
WBC: 5.9 10*3/uL (ref 4.0–10.5)
nRBC: 0 % (ref 0.0–0.2)

## 2020-03-08 LAB — URINALYSIS, DIPSTICK ONLY
Bilirubin Urine: NEGATIVE
Glucose, UA: NEGATIVE mg/dL
Hgb urine dipstick: NEGATIVE
Ketones, ur: NEGATIVE mg/dL
Leukocytes,Ua: NEGATIVE
Nitrite: NEGATIVE
Protein, ur: NEGATIVE mg/dL
Specific Gravity, Urine: 1.025 (ref 1.005–1.030)
pH: 5 (ref 5.0–8.0)

## 2020-03-08 MED ORDER — SODIUM CHLORIDE 0.9 % IV SOLN
10.0000 mg | Freq: Once | INTRAVENOUS | Status: AC
  Administered 2020-03-08: 10 mg via INTRAVENOUS
  Filled 2020-03-08: qty 10

## 2020-03-08 MED ORDER — SODIUM CHLORIDE 0.9 % IV SOLN
Freq: Once | INTRAVENOUS | Status: AC
  Filled 2020-03-08: qty 250

## 2020-03-08 MED ORDER — ATROPINE SULFATE 1 MG/ML IJ SOLN
0.5000 mg | Freq: Once | INTRAMUSCULAR | Status: AC | PRN
  Administered 2020-03-08: 0.5 mg via INTRAVENOUS
  Filled 2020-03-08: qty 1

## 2020-03-08 MED ORDER — LOPERAMIDE HCL 2 MG PO TABS: ORAL_TABLET | ORAL | 1 refills | Status: DC

## 2020-03-08 MED ORDER — SODIUM CHLORIDE 0.9 % IV SOLN
180.0000 mg/m2 | Freq: Once | INTRAVENOUS | Status: AC
  Administered 2020-03-08: 360 mg via INTRAVENOUS
  Filled 2020-03-08: qty 5

## 2020-03-08 MED ORDER — SODIUM CHLORIDE 0.9 % IV SOLN
2475.0000 mg/m2 | INTRAVENOUS | Status: DC
  Administered 2020-03-08: 5000 mg via INTRAVENOUS
  Filled 2020-03-08: qty 100

## 2020-03-08 MED ORDER — SODIUM CHLORIDE 0.9 % IV SOLN
Freq: Once | INTRAVENOUS | Status: AC
  Filled 2020-03-08: qty 10

## 2020-03-08 MED ORDER — CYANOCOBALAMIN 1000 MCG/ML IJ SOLN
1000.0000 ug | Freq: Once | INTRAMUSCULAR | Status: AC
  Administered 2020-03-08: 1000 ug via INTRAMUSCULAR
  Filled 2020-03-08: qty 1

## 2020-03-08 MED ORDER — HEPARIN SOD (PORK) LOCK FLUSH 100 UNIT/ML IV SOLN
500.0000 [IU] | Freq: Once | INTRAVENOUS | Status: DC
  Filled 2020-03-08: qty 5

## 2020-03-08 MED ORDER — SODIUM CHLORIDE 0.9% FLUSH
10.0000 mL | Freq: Once | INTRAVENOUS | Status: AC
  Administered 2020-03-08: 10 mL via INTRAVENOUS
  Filled 2020-03-08: qty 10

## 2020-03-08 MED ORDER — FLUOROURACIL CHEMO INJECTION 2.5 GM/50ML
400.0000 mg/m2 | Freq: Once | INTRAVENOUS | Status: AC
  Administered 2020-03-08: 800 mg via INTRAVENOUS
  Filled 2020-03-08: qty 16

## 2020-03-08 MED ORDER — SODIUM CHLORIDE 0.9 % IV SOLN
400.0000 mg/m2 | Freq: Once | INTRAVENOUS | Status: AC
  Administered 2020-03-08: 812 mg via INTRAVENOUS
  Filled 2020-03-08: qty 25

## 2020-03-08 MED ORDER — PALONOSETRON HCL INJECTION 0.25 MG/5ML
0.2500 mg | Freq: Once | INTRAVENOUS | Status: AC
  Administered 2020-03-08: 0.25 mg via INTRAVENOUS
  Filled 2020-03-08: qty 5

## 2020-03-08 MED ORDER — DEXAMETHASONE 4 MG PO TABS: 8.0000 mg | ORAL_TABLET | Freq: Every day | ORAL | 5 refills | Status: DC

## 2020-03-08 MED ORDER — LORAZEPAM 0.5 MG PO TABS: 0.5000 mg | ORAL_TABLET | Freq: Four times a day (QID) | ORAL | 0 refills | Status: DC | PRN

## 2020-03-08 NOTE — Progress Notes (Signed)
Patient has been researching Avastin as discussed with Dr Mike Gip and states that she thinks she will give it a try. She states that she is having trouble sleeping but hasn't found anyone to help with that. She is not having abdominal pain today.

## 2020-03-08 NOTE — ED Provider Notes (Signed)
[facility] Emergency Department Provider Note  ____________________________________________  Time seen: Approximately 7:55 PM  I have reviewed the triage vital signs and the nursing notes.   HISTORY  Chief Complaint Vascular Access Problem    HPI Tamara Hall is a 64 y.o. female who presents the emergency department requesting reestablishment of her report.  Patient was receiving an infusion at home to reported cath.  As she was taking her shirt off the Port-A-Cath accessed became dislodged.  Patient called her oncologist and was advised to present to the emergency department to have it reaccessed.  Patient has no other complaints at this time.         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[place]) 05/27/2017   Nausea without vomiting 05/06/2017   Liver metastases ([place]) 02/25/2017   Goals of care, counseling/discussion 02/18/2017   Encounter for antineoplastic chemotherapy 02/18/2017   Cancer related pain 02/16/2017   Tobacco use 02/06/2017   Abnormal findings-gastrointestinal tract    Rectum neoplasm    Rectal bleeding 01/13/2017   Rectal cancer [facility])     Past Surgical History:  Procedure Laterality Date   ABDOMINAL HYSTERECTOMY     BREAST SURGERY     COLON SURGERY  10/2017   [facility]   COLONOSCOPY WITH PROPOFOL N/A 01/14/2017   Procedure: COLONOSCOPY WITH PROPOFOL;  Surgeon: Lucilla Lame, MD;  Location: [facility] ENDOSCOPY;  Service: Endoscopy;  Laterality: N/A;   ELBOW SURGERY Right    Has had 4  surgeries on right elbow.   HAND SURGERY Right 2008   JOINT REPLACEMENT     LIVER BIOPSY  10/2017   lung wedge resection  10/2017   PORTA CATH INSERTION N/A 01/29/2017   Procedure: Glori Luis Cath Insertion;  Surgeon: Algernon Huxley, MD;  Location: [place] CV LAB;  Service: Cardiovascular;  Laterality: N/A;   TONSILLECTOMY      Prior to Admission medications   Medication Sig Start Date End Date Taking? Authorizing Provider  albuterol (VENTOLIN HFA) 108 (90 Base) MCG/ACT inhaler Inhale into the lungs. 01/22/19   [provider]  amLODipine (NORVASC) 5 MG tablet Take 5 mg by mouth daily. 08/02/19   [provider]  baclofen (LIORESAL) 10 MG tablet Take 10 mg by mouth 3 (three) times daily.    [provider]  dexamethasone (DECADRON) 4 MG tablet Take 2 tablets (8 mg total) by mouth daily. Start the day after chemo for 2 days. 03/08/20   Lequita Asal, MD  folic acid (FOLVITE) 1 MG tablet TAKE 1 TABLET BY MOUTH EVERY DAY 02/28/20   Lequita Asal, MD  gabapentin (NEURONTIN) 300 MG capsule TAKE 1 CAPSULE BY MOUTH THREE TIMES A DAY 03/16/18   Karen Kitchens, NP  gabapentin (NEURONTIN) 300 MG capsule Take 1,200 mg by mouth 3 (three) times daily.  05/14/19 05/13/20  [provider]  hydrochlorothiazide (HYDRODIURIL) 25 MG tablet Take 25 mg by mouth daily. 06/16/19 06/15/20  [provider]  HYDROcodone-acetaminophen (NORCO/VICODIN)  5-325 MG tablet Take 1 tablet by mouth every 6 (six) hours as needed for moderate pain. 05/10/18   Johnn Hai, PA-C  loperamide (IMODIUM A-D) 2 MG tablet Take 2 at diarrhea onset , then 1 every 2hr until 12hrs with no BM. May take 2 every 4hrs at night. If diarrhea recurs repeat. 03/08/20   Lequita Asal, MD  LORazepam (ATIVAN) 0.5 MG tablet Take one tablet (0.5 mg) by mouth 30 minutes prior to the procedure.  Patient must have a driver. Patient not taking: Reported on 02/09/2020 11/11/19   Lequita Asal, MD   LORazepam (ATIVAN) 0.5 MG tablet Take 1 tablet (0.5 mg total) by mouth every 6 (six) hours as needed (NAUSEA). 03/08/20   Lequita Asal, MD  nortriptyline (PAMELOR) 10 MG capsule Take 10 mg by mouth at bedtime. 07/16/19   [provider]  ondansetron (ZOFRAN ODT) 8 MG disintegrating tablet Take 1 tablet (8 mg total) by mouth every 8 (eight) hours as needed for nausea or vomiting. 10/28/19   Lequita Asal, MD  Ostomy Supplies (PREMIER DRAINABLE POUCH [phone]) Pouch MISC  12/20/19   [provider]  potassium chloride (KLOR-CON M10) 10 MEQ tablet Take 1 tablet (10 mEq total) by mouth daily. 03/03/20   Jacquelin Hawking, NP    Allergies Patient has no known allergies.  Family History  Problem Relation Age of Onset   Lung cancer Mother 55   Esophageal cancer Father 61   Brain cancer Maternal Grandmother     Social History Social History   Tobacco Use   Smoking status: Former Smoker    Packs/day: 2.00    Years: 40.00    Pack years: 80.00    Types: Cigarettes    Quit date: 10/07/2017    Years since quitting: 2.4   Smokeless tobacco: Never Used  Substance Use Topics   Alcohol use: No   Drug use: No     Review of Systems  Constitutional: No fever/chills Eyes: No visual changes. No discharge ENT: No upper respiratory complaints. Cardiovascular: no chest pain.  Port-A-Cath deaccessed accidentally at home.  Here for replacement of needle Respiratory: no cough. No SOB. Gastrointestinal: No abdominal pain.  No nausea, no vomiting.  No diarrhea.  No constipation. Musculoskeletal: Negative for musculoskeletal pain. Skin: Negative for rash, abrasions, lacerations, ecchymosis. Neurological: Negative for headaches, focal weakness or numbness. 10-point ROS otherwise negative.  ____________________________________________   PHYSICAL EXAM:  VITAL SIGNS: ED Triage Vitals [03/08/20 1903]  Enc Vitals Group     BP 126/60     Pulse Rate 90     Resp 18      Temp 98 F (36.7 C)     Temp Source Oral     SpO2 100 %     Weight      Height      Head Circumference      Peak Flow      Pain Score      Pain Loc      Pain Edu?      Excl. in [place]?      Constitutional: Alert and oriented. Well appearing and in no acute distress. Eyes: Conjunctivae are normal. PERRL. EOMI. Head: Atraumatic. ENT:      Ears:       Nose: No congestion/rhinnorhea.      Mouth/Throat: Mucous membranes are moist.  Neck: No stridor.    Cardiovascular: Normal rate, regular rhythm. Normal S1 and S2.  Good peripheral circulation.  Visualization  of the right anterior chest wall reveals findings consistent with removal of the cath access.  No retained material.  No bleeding.  Area with no ecchymosis, erythema, edema. Respiratory: Normal respiratory effort without tachypnea or retractions. Lungs CTAB. Good air entry to the bases with no decreased or absent breath sounds. Musculoskeletal: Full range of motion to all extremities. No gross deformities appreciated. Neurologic:  Normal speech and language. No gross focal neurologic deficits are appreciated.  Skin:  Skin is warm, dry and intact. No rash noted. Psychiatric: Mood and affect are normal. Speech and behavior are normal. Patient exhibits appropriate insight and judgement.   ____________________________________________   LABS (all labs ordered are listed, but only abnormal results are displayed)  Labs Reviewed - No data to display ____________________________________________  EKG   ____________________________________________  RADIOLOGY   No results found.  ____________________________________________    PROCEDURES  Procedure(s) performed:    Procedures    Medications - No data to display   ____________________________________________   INITIAL IMPRESSION / ASSESSMENT AND PLAN / ED COURSE  Pertinent labs & imaging results that were available during my care of the patient were reviewed by me  and considered in my medical decision making (see chart for details).  Review of the [place] CSRS was performed in accordance of the [place] prior to dispensing any controlled drugs.           Patient's diagnosis is consistent with patient presented for reaccess of her Port-A-Cath.  Patient was receiving infusion at home when her access was accidentally removed while she was taking her shirt off.  Patient is here for replacement of the needle.  No other complaints.  Area with no evidence of trauma other than removal of the access needle.  No underlying ecchymosis, hematoma.  Area is nontender.  No evidence of cellulitis.  Patient will have port reaccessed and discharged.  Follow-up with oncology. Patient is given ED precautions to return to the ED for any worsening or new symptoms.     ____________________________________________  FINAL CLINICAL IMPRESSION(S) / ED DIAGNOSES  Final diagnoses:  Encounter for care related to vascular access port      NEW MEDICATIONS STARTED DURING THIS VISIT:  ED Discharge Orders    None          This chart was dictated using voice recognition software/Dragon. Despite best efforts to proofread, errors can occur which can change the meaning. Any change was purely unintentional.    Darletta Moll, PA-C 03/09/20 0044    Lilia Pro., MD 03/09/20 [id number]

## 2020-03-08 NOTE — ED Notes (Signed)
Port accessed and Chemo started back at previously set rate. Blood returned after port was accessed and pt denies pain when flushed. NO swelling noted.

## 2020-03-08 NOTE — ED Triage Notes (Signed)
First Nurse Note:  Arrives c/o right chest port being 'de accessed, c/o needle sticking her in the chest wall.  Port de accessed and pump clamped.  Pump company called for assistance in turning pump off until port can be re accessed and infusion continued.

## 2020-03-08 NOTE — ED Triage Notes (Signed)
Pts right port became de accessed today by pts shirt. Pt to ED to have port accessed back so that her chemo pump can run. Area is dry, no bleeding.

## 2020-03-09 ENCOUNTER — Telehealth: Payer: Self-pay | Admitting: Oncology

## 2020-03-09 ENCOUNTER — Other Ambulatory Visit: Payer: Medicare HMO

## 2020-03-09 ENCOUNTER — Ambulatory Visit: Payer: Medicare HMO | Admitting: Hematology and Oncology

## 2020-03-09 ENCOUNTER — Ambulatory Visit: Payer: Medicare HMO

## 2020-03-09 LAB — CEA: CEA: 121 ng/mL — ABNORMAL HIGH (ref 0.0–4.7)

## 2020-03-09 NOTE — Telephone Encounter (Signed)
Patient reported that tape that covers her medi port came off when she took off her shirt. She feels that " port is coming out".  Advise patient to go to ER for examination. She agrees with the plan.

## 2020-03-09 NOTE — Telephone Encounter (Signed)
  Please call to check on the patient given her issues with the port and having to go to the ER last night to be re-accessed.  If she has any concerns, the infusion nurses can see her today.  M

## 2020-03-10 ENCOUNTER — Other Ambulatory Visit: Payer: Self-pay

## 2020-03-10 ENCOUNTER — Inpatient Hospital Stay: Payer: Medicare HMO

## 2020-03-10 ENCOUNTER — Other Ambulatory Visit: Payer: Self-pay | Admitting: Hematology and Oncology

## 2020-03-10 VITALS — BP 117/59 | HR 76 | Temp 96.5°F | Resp 18

## 2020-03-10 DIAGNOSIS — C7802 Secondary malignant neoplasm of left lung: Secondary | ICD-10-CM | POA: Diagnosis not present

## 2020-03-10 DIAGNOSIS — C20 Malignant neoplasm of rectum: Secondary | ICD-10-CM

## 2020-03-10 DIAGNOSIS — Z5111 Encounter for antineoplastic chemotherapy: Secondary | ICD-10-CM | POA: Diagnosis present

## 2020-03-10 DIAGNOSIS — E538 Deficiency of other specified B group vitamins: Secondary | ICD-10-CM | POA: Diagnosis not present

## 2020-03-10 DIAGNOSIS — Z5189 Encounter for other specified aftercare: Secondary | ICD-10-CM | POA: Diagnosis not present

## 2020-03-10 DIAGNOSIS — E876 Hypokalemia: Secondary | ICD-10-CM | POA: Diagnosis not present

## 2020-03-10 LAB — POTASSIUM: Potassium: 4 mmol/L (ref 3.5–5.1)

## 2020-03-10 MED ORDER — PEGFILGRASTIM-JMDB 6 MG/0.6ML ~~LOC~~ SOSY
6.0000 mg | PREFILLED_SYRINGE | Freq: Once | SUBCUTANEOUS | Status: AC
  Administered 2020-03-10: 6 mg via SUBCUTANEOUS
  Filled 2020-03-10: qty 0.6

## 2020-03-10 MED ORDER — HEPARIN SOD (PORK) LOCK FLUSH 100 UNIT/ML IV SOLN
500.0000 [IU] | Freq: Once | INTRAVENOUS | Status: AC | PRN
  Administered 2020-03-10: 500 [IU]
  Filled 2020-03-10: qty 5

## 2020-03-10 MED ORDER — HEPARIN SOD (PORK) LOCK FLUSH 100 UNIT/ML IV SOLN
INTRAVENOUS | Status: AC
  Filled 2020-03-10: qty 5

## 2020-03-14 NOTE — Progress Notes (Signed)
[facility]  [address] [place], [place] [address] Phone: [phone]  Fax: [phone]   Clinic Day:  03/15/2020  Referring physician: Loni Muse, MD  Chief Complaint: Tamara Hall is a 64 y.o. female with stage IV rectal cancer s/p microwave hepatic ablation, APR with colostomy, and VATS wedge resectionwho is seen for nadir assessment on day 8 of cycle #1 FOLFIRI.   HPI: The patient was last seen in the medical oncology clinic on 03/08/2020. At that time, she felt good.  Exam was stable.  CEA was 121.0.  Potassium was 3.1.  She received cycle #1 FOLFIRI.  She received her monthly B12 injection.  She received potassium 20 meq IV.  Oral potassium was increased to 20 meq a day.  She received Fulphila on pump disconnect on 03/10/2020.  Potassium was 4.0 on 03/10/2020.  Patient was seen in the [facility] ER on 03/08/2020 following her appointment. Her Port-A-Cath needle was dislodged after taking off her shirt.  She was reassessed.   Symptomatically, she has been having hot flashes over the last two days. She feels like she has a lump in her throat because every time she swallows it feels like something is in the way; it is not painful. She has no appetite; nothing tastes good to her. She was only able to eat Frito[employer] yesterday. She didn't eat anything on Saturday and Sunday. She has been nauseous since Saturday and vomited 3 times.  Emesis has resolved.  She has been having some trouble sleeping. She notes slight back pain. She has been taking potassium twice a day. She admits to diarrhea on Saturday, Sunday, and Monday.  She had no diarrhea yesterday.  She began taking imodium to help. She denies any abdominal pain or spasms.  She feels like she is improving.   She plans to see the dentist on 03/20/2020.    Past Medical History:  Diagnosis Date  . Cancer ([place])    colon, with mets to liver and left lung  . Hypertension   . Pneumonia    20+ years ago     Past Surgical History:  Procedure Laterality Date  . ABDOMINAL HYSTERECTOMY    . BREAST SURGERY    . COLON SURGERY  10/2017   [facility]  . COLONOSCOPY WITH PROPOFOL N/A 01/14/2017   Procedure: COLONOSCOPY WITH PROPOFOL;  Surgeon: Lucilla Lame, MD;  Location: [facility] ENDOSCOPY;  Service: Endoscopy;  Laterality: N/A;  . ELBOW SURGERY Right    Has had 4 surgeries on right elbow.  Marland Kitchen HAND SURGERY Right 2008  . JOINT REPLACEMENT    . LIVER BIOPSY  10/2017  . lung wedge resection  10/2017  . PORTA CATH INSERTION N/A 01/29/2017   Procedure: Glori Luis Cath Insertion;  Surgeon: Algernon Huxley, MD;  Location: [facility];  Service: Cardiovascular;  Laterality: N/A;  . TONSILLECTOMY      Family History  Problem Relation Age of Onset  . Lung cancer Mother 34  . Esophageal cancer Father 31  . Brain cancer Maternal Grandmother     Social History:  reports that she quit smoking about 2 years ago. Her smoking use included cigarettes. She has a 80.00 pack-year smoking history. She has never used smokeless tobacco. She reports that she does not drink alcohol or use drugs. She was smoking 1/2 packs/day.She lives in [place]. She has 3 children (ages 71, 38, and 74). She smoked 2 packs a day. She started smoking at age 76. She initially  stopped smoking on 10/24/2017. She is smoking.  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 [name] is her significant other. The patient is alone today.   Allergies: No Known Allergies  Current Medications: Current Outpatient Medications  Medication Sig Dispense Refill  . albuterol (VENTOLIN HFA) 108 (90 Base) MCG/ACT inhaler Inhale into the lungs.    Marland Kitchen amLODipine (NORVASC) 5 MG tablet Take 5 mg by mouth daily.    . baclofen (LIORESAL) 10 MG tablet Take 10 mg by mouth 3 (three) times daily.    Marland Kitchen dexamethasone (DECADRON) 4 MG tablet Take 2 tablets (8 mg total) by mouth daily. Start the day after chemo for 2 days. 8 tablet  5  . folic acid (FOLVITE) 1 MG tablet TAKE 1 TABLET BY MOUTH EVERY DAY 90 tablet 1  . gabapentin (NEURONTIN) 300 MG capsule Take 1,200 mg by mouth 3 (three) times daily.     . hydrochlorothiazide (HYDRODIURIL) 25 MG tablet Take 25 mg by mouth daily.    Marland Kitchen HYDROcodone-acetaminophen (NORCO/VICODIN) 5-325 MG tablet Take 1 tablet by mouth every 6 (six) hours as needed for moderate pain. 15 tablet 0  . loperamide (IMODIUM A-D) 2 MG tablet Take 2 at diarrhea onset , then 1 every 2hr until 12hrs with no BM. May take 2 every 4hrs at night. If diarrhea recurs repeat. 100 tablet 1  . LORazepam (ATIVAN) 0.5 MG tablet Take 1 tablet (0.5 mg total) by mouth every 6 (six) hours as needed (NAUSEA). 30 tablet 0  . nortriptyline (PAMELOR) 10 MG capsule Take 10 mg by mouth at bedtime.    . Ostomy Supplies (PREMIER DRAINABLE POUCH [phone]) Pouch MISC     . potassium chloride (KLOR-CON M10) 10 MEQ tablet Take 1 tablet (10 mEq total) by mouth daily. 60 tablet 1  . ondansetron (ZOFRAN ODT) 8 MG disintegrating tablet Take 1 tablet (8 mg total) by mouth every 8 (eight) hours as needed for nausea or vomiting. 20 tablet 6   No current facility-administered medications for this visit.   Facility-Administered Medications Ordered in Other Visits  Medication Dose Route Frequency Provider Last Rate Last Admin  . heparin lock flush 100 unit/mL  500 Units Intravenous Once Corcoran, Melissa C, MD      . sodium chloride flush (NS) 0.9 % injection 10 mL  10 mL Intravenous PRN Lequita Asal, MD   10 mL at 03/15/20 0915    Review of Systems  Constitutional: Negative for chills, diaphoresis, fever, malaise/fatigue and weight loss (up 4 lb).       Feeling "better".   HENT: Negative for congestion, ear pain, nosebleeds, sinus pain, sore throat and tinnitus.        Dental work planned on 03/20/2020.  Eyes: Negative.  Negative for blurred vision and double vision.  Respiratory: Negative.  Negative for cough, sputum production and  shortness of breath.   Cardiovascular: Negative for chest pain, palpitations and leg swelling.  Gastrointestinal: Positive for nausea. Negative for abdominal pain (no spasms), blood in stool, constipation, diarrhea (over the weekend; resolved), heartburn, melena and vomiting (3 times over the weekend; resolved).       Colostomy s/p APR.  Poor appetite.  Nothing tastes good.  Genitourinary: Negative.  Negative for dysuria, frequency and urgency.  Musculoskeletal: Positive for back pain. Negative for falls, joint pain, myalgias and neck pain.  Skin: Negative.  Negative for itching and rash.  Neurological: Negative.  Negative for  dizziness, sensory change, speech change, weakness and headaches.  Endo/Heme/Allergies: Negative.  Negative for environmental allergies. Does not bruise/bleed easily.  Psychiatric/Behavioral: Negative for depression and memory loss. The patient has insomnia. The patient is not nervous/anxious.    Performance status (ECOG):  1  Vitals Blood pressure 136/65, pulse 89, temperature (!) 96.4 F (35.8 C), temperature source Tympanic, resp. rate 18, weight 191 lb 5.8 oz (86.8 kg), SpO2 98 %.   Physical Exam Vitals and nursing note reviewed.  Constitutional:      General: She is not in acute distress.    Appearance: Normal appearance. She is well-developed and well-nourished. She is not diaphoretic.     Interventions: Face mask in place.     Comments: She has a rolling walker by her side. She required assistance on and off the exam room table.   HENT:     Head: Normocephalic and atraumatic.     Right Ear: External ear normal.     Mouth/Throat:     Mouth: Oropharynx is clear and moist and mucous membranes are normal. No oral lesions.      Comments: Long blonde/gray hair pulled back. Posterior pharynx is slightly pink without exudate or mucositis. Eyes:     General: No scleral icterus.       Right eye: No discharge.        Left eye: No discharge.     Extraocular  Movements: EOM normal.     Conjunctiva/sclera: Conjunctivae normal.     Pupils: Pupils are equal, round, and reactive to light.     Comments: Blue eyes.  Neck:     Vascular: No JVD.  Cardiovascular:     Rate and Rhythm: Normal rate and regular rhythm.     Pulses: Intact distal pulses.     Heart sounds: Normal heart sounds. No murmur heard.  No friction rub. No gallop.   Pulmonary:     Effort: Pulmonary effort is normal. No respiratory distress.     Breath sounds: Normal breath sounds. No wheezing, rhonchi or rales.  Chest:     Chest wall: No tenderness.  Abdominal:     General: Bowel sounds are normal. There is no distension.     Palpations: Abdomen is soft. There is no hepatosplenomegaly or mass.     Tenderness: There is no abdominal tenderness. There is no guarding or rebound.     Comments: Colostomy site unremarkable.  Musculoskeletal:        General: No tenderness or edema. Normal range of motion.     Cervical back: Normal range of motion and neck supple.  Lymphadenopathy:     Head:     Right side of head: No preauricular, posterior auricular or occipital adenopathy.     Left side of head: No preauricular, posterior auricular or occipital adenopathy.     Cervical: No cervical adenopathy.     Right cervical: No superficial cervical adenopathy.    Left cervical: No superficial cervical adenopathy.     Upper Body:  No axillary adenopathy present.    Right upper body: No supraclavicular adenopathy.     Left upper body: No supraclavicular adenopathy.     Lower Body: No right inguinal adenopathy. No left inguinal adenopathy.  Skin:    General: Skin is warm, dry and intact.     Coloration: Skin is not pale.     Findings: No bruising, erythema, lesion or rash.  Neurological:     Mental Status: She is alert and oriented to person,  place, and time.  Psychiatric:        Mood and Affect: Mood and affect normal.        Behavior: Behavior normal.        Thought Content: Thought  content normal.        Judgment: Judgment normal.    Imaging studies: 01/13/2017:Abdomen and pelvic CTrevealed irregular thickening of the rectum suspicious for carcinoma. There was a 1.6 cm hypoattenuating lesion in the right hepatic lobe worrisome for metastatic disease. 01/15/2017:Chest CT revealed a 9 mm noncalcified left lower lobe pulmonary nodule (new). 01/16/2017:Pelvic MRIrevealed rectal adenocarcinoma T3N1. There was extension beyond the muscularis propria (17 mm). There were mesorectal nodes >= 5 mm. There was adenopathy on the left side of the mesorectum, including a 9 mm lesion. The distance from the tumor to the anal sphincter was 8 mm. 01/27/2017:PET scanrevealed intense metabolic activity associated rectal mass consists with primary rectal carcinoma. There was a hypermetabolic metastatic lesion to the central LEFT hepatic lobe. There was metabolic activity associated with small LEFT lower lobe nodule which was most consistent with pulmonary metastasis. 01/31/2017:Liver MRIrevealed a 2.6 x 2.7 x 2.1 cm lesion in segment VIII of the liver near the junction with segment IVa c/w a metastatic lesion. There was a 1.1 x 0.8 cm suspected metastatic lesion peripherally in segment VI of the liver.  05/16/2017:Abdomen and pelvic CTrevealed persistent but decreased asymmetric wall thickening in the rectum. The medial segment left liver lesion was smaller (2.7 x 2.6 cm to 1.9 x 1.6 cm). A tiny lesion seen in segment VI of the liver on previous MRI was not evident on today's CT scan. The left lower lobe pulmonary nodule was smaller (9 mm to 6 mm). There was no new or progressive findings in the abdomen and pelvis. 08/19/2017:Chest CTwith contrast on 08/19/2017 revealed left lower lobe 6 mm solid pulmonary nodule, decreased since 01/15/2017 chest CT. Stable 7 mm right lower lobe pulmonary nodule. There was no new or progressive metastatic disease in the chest.   09/03/2018: [facility] demonstrated a partial response of the primary tumor and extramural disease. Post treatment category ymrT T3b. There was an overall decrease in size in the previously seen perirectal node. Hypointense changes at the site of prior tumor focally contacedt buts does not definitely invade the LEFT levator ani. Tumor contacted, but does not definitely invade the internal anal sphincter. No evidence of residual or recurrent hepatic disease following microwave ablation. 02/17/2018:Abdomen and pelvic CTrevealed evolution of the ablation zone in segment 4A, now measuring 3.7 x 3.1 cm, decreased.There was no findings to suggest viable tumor.She is s/pabdominoperineal resection with left lower quadrant colostomy. There was no evidence of new/progressive metastatic disease.She is s/pleft lower lobe pulmonary wedge resection, incompletely visualized. 03/04/2018:Chest CTrevealed a stable chest CT (compared to11/13/2018). The index right lower lobe lung nodule measured7 mm (unchanged). There was no new or progressive nodularity identified. There was continued decrease in size of ablation defect within segment 4a of the liver. 08/13/2019:Abdominaland [facility] at [facility] of parastomal hernia with small bowel as well as multiple small bowel loops whichwere dilated.There was hepatic steatosis and post-treatment changes in the pelvis with persistent presacral soft tissue. There was a 3.5 cm infrarenal abdominal aortic aneurysm. 09/09/2019:Chest CT at [facility] no intrathoracic metastasis and no new or enlarging pulmonary nodules. 09/09/2019:Abdomen and pelvis CT at [facility] multiple subcentimeter hypodensities in the dome of the liver, which appeared new/more conspicuous from prior CT. There were not seen on the prior  MRI. Further evaluation with MRI was recommended. CT evaluationwas limited due to background fatty liver and the  heterogeneity/these lesions could have been secondary to fatty liver. There was unchanged soft tissue stranding and nodularity in the pelvis, with a few prominent more nodular areas. There were a few more prominent indeterminateareasand short-term follow up was recommended to ensure stability and exclude peritoneal disease. Further evaluation with MRI could have been helpful. 11/12/2019: Liver and pelvis MRIrevealedno findings to explain the patient's history of rising CEA. There was irregular presacral soft tissue with apparent scarring noted in the left paracentral pelviswas similar to prior studies and presumably treatment related. PET-CT could be used, as clinically warranted to exclude hypermetabolic disease within thatregion. There was an ablation defect identified in segment IV with minimal irregular enhancement along the anterosuperior margin, indeterminate. Attention on follow-up recommended.There was a tiny cyst identified in the dome of the right liver without other findings to suggest metastatic disease in the hepatic parenchyma. 11/29/2019:PET scanrevealed 1.6 x 1.5 cmnodularthickeningat the resection margin (SUV 11.3)in the LEFT lower lobe. There was no evidence of hepatic metastasis. There was no evidence of local recurrence in pelvis. There was insufficiency fracture in the LEFT sacral ala presumably related to radiation treatment. 02/23/2020:  Chest, abdomen and pelvis CT revealed a stable 2.6 x 1.7 cm partially calcified left infrahilar pulmonary nodule, hypermetabolic on PET scan and likely metastasis.  There were new no new or enlarging pulmonary nodules.  There were stable treated hepatic metastatic disease with no evidence of progression or local recurrence.  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  There is no evidence of incarceration or obstruction.  There was mild wall thickening of multiple loops of small bowel in  the mid abdomen, possibly related to prior radiation therapy.  There was stable chronic sacral insufficiency fractures bilaterally.  There was a stable 3.7 cm fusiform infrarenal abdominal aortic aneurysm.     Infusion on 03/15/2020  Component Date Value Ref Range Status  . Sodium 03/15/2020 135  135 - 145 mmol/L Final  . Potassium 03/15/2020 3.0* 3.5 - 5.1 mmol/L Final  . Chloride 03/15/2020 94* 98 - 111 mmol/L Final  . CO2 03/15/2020 29  22 - 32 mmol/L Final  . Glucose, Bld 03/15/2020 143* 70 - 99 mg/dL Final   Glucose reference range applies only to samples taken after fasting for at least 8 hours.  . BUN 03/15/2020 10  8 - 23 mg/dL Final  . Creatinine, Ser 03/15/2020 0.90  0.44 - 1.00 mg/dL Final  . Calcium 03/15/2020 8.5* 8.9 - 10.3 mg/dL Final  . GFR calc non Af Amer 03/15/2020 >60  >60 mL/min Final  . GFR calc Af Amer 03/15/2020 >60  >60 mL/min Final  . Anion gap 03/15/2020 12  5 - 15 Final   Performed at [facility], [address] [place], [place] [address]  . WBC 03/15/2020 14.7* 4.0 - 10.5 K/uL Final  . RBC 03/15/2020 4.55  3.87 - 5.11 MIL/uL Final  . Hemoglobin 03/15/2020 14.1  12.0 - 15.0 g/dL Final  . HCT 03/15/2020 41.5  36.0 - 46.0 % Final  . MCV 03/15/2020 91.2  80.0 - 100.0 fL Final  . MCH 03/15/2020 31.0  26.0 - 34.0 pg Final  . MCHC 03/15/2020 34.0  30.0 - 36.0 g/dL Final  . RDW 03/15/2020 12.7  11.5 - 15.5 % Final  . Platelets 03/15/2020 176  150 - 400 K/uL Final  .  nRBC 03/15/2020 0.2  0.0 - 0.2 % Final  . Neutrophils Relative % 03/15/2020 74  % Final  . Neutro Abs 03/15/2020 11.1* 1.7 - 7.7 K/uL Final  . Lymphocytes Relative 03/15/2020 13  % Final  . Lymphs Abs 03/15/2020 1.9  0.7 - 4.0 K/uL Final  . Monocytes Relative 03/15/2020 9  % Final  . Monocytes Absolute 03/15/2020 1.3* 0.1 - 1.0 K/uL Final  . Eosinophils Relative 03/15/2020 1  % Final  . Eosinophils Absolute 03/15/2020 0.1  0.0 - 0.5 K/uL Final  . Basophils Relative 03/15/2020 0  %  Final  . Basophils Absolute 03/15/2020 0.0  0.0 - 0.1 K/uL Final  . Immature Granulocytes 03/15/2020 3  % Final  . Abs Immature Granulocytes 03/15/2020 0.40* 0.00 - 0.07 K/uL Final   Performed at [facility], [address] [place], [place] [address]  . Color, Urine 03/15/2020 YELLOW  YELLOW Final  . APPearance 03/15/2020 CLEAR  CLEAR Final  . Specific Gravity, Urine 03/15/2020 1.020  1.005 - 1.030 Final  . pH 03/15/2020 5.0  5.0 - 8.0 Final  . Glucose, UA 03/15/2020 NEGATIVE  NEGATIVE mg/dL Final  . Hgb urine dipstick 03/15/2020 NEGATIVE  NEGATIVE Final  . Bilirubin Urine 03/15/2020 NEGATIVE  NEGATIVE Final  . Ketones, ur 03/15/2020 NEGATIVE  NEGATIVE mg/dL Final  . Protein, ur 03/15/2020 NEGATIVE  NEGATIVE mg/dL Final  . Nitrite 03/15/2020 NEGATIVE  NEGATIVE Final  . Chalmers Guest 03/15/2020 TRACE* NEGATIVE Final   Performed at [facility], [address] [place], [place] [address]    Assessment:  Tamara Hall is a 64 y.o. female with clinical stage T3N1M1rectal cancer. She presented with a 9 month history of rectal bleeding. Colonoscopyon 01/14/2017 revealed afrond-like/villous non-obstructing large mass in the rectum. The mass was non-circumferential. Biopsies revealed high grade dysplasia within an adenoma (? sampling error). CEAwas 31.6 on 01/15/2017.  PET scanon 01/27/2017 revealed intense metabolic activity associated rectal mass consists with primary rectal carcinoma. There was a hypermetabolic metastatic lesion to the central LEFT hepatic lobe. There was metabolic activity associated with small LEFT lower lobe nodule which was most consistent with pulmonary metastasis.  CT guided liver biopsyat [facility] on 02/07/2017 revealed malignant cells c/w metastatic colorectal adenocarcinoma. [facility] One on 12/29/2019 [phone]) revealed MS-stable, 1 Muts/Mb tumor mutational burden; KRAS G12C, PTEN rearrangement intron 8, APC G1106*,  [id number]*18, and NRAS wildtype.  There were no reportable mutations in BRAF and NRAS.     CEAhas been followed: 31.6 on 01/15/2017, 38.1 on 02/18/2017, 25.1 on 03/18/2017, 11.8 on 04/08/2017, 9.2 on 04/22/2017, 9.5 on 05/20/2017, 9.6 on 06/19/2017, 3.7 on 02/18/2018,5.8 on 06/26/2018, 10.5 on 11/20/2018, 19.5 on 03/26/2019, 34.0 on 07/21/2019, 46.4 on 09/09/2019, 54.4 on 10/28/2019, 108.0 on 02/10/2020, and 121.0 on 03/08/2020.  She received 2 cycles of FOLFOX (02/18/2017 - 03/04/2017). She developed transient cold induced neuropathy. She developed wheezing and hypoxia with cycle #2 felt secondary to oxaliplatin. She received 4 cycles ofFOLFIRI(03/24/2017 - 04/22/2017) with Neulasta support. Diarrhea was controlled with Imodium.  She underwent CT guided microwave ablation of the liver lesionon 06/06/2017.   She began radiationon 06/18/2017. She receiveddaily XMelody Haverradiation. Radiation and Xeloda completed on 08/15/2017.  She underwentAPR with descending colostomy and VRAM flapon 10/24/2017. She underwent a VATS wedge resectionto her LEFT lung. Pathology from the lung demonstrateda 1 cmmetastatic intestinal type adenocarcinoma c/w metastasis from patient's known colorectal carcinoma.Pathology from the rectal resection revealed a 2.1 cm invasive moderately differentiated adenocarcinoma arising in a tubular adenoma  with transmural invasion focally into the perirectal soft tissue. There was no lymphovascular or perineural invasion. Margins were negative. Zero of 20 lymph nodes were positive. Final diagnosiswas [id number] rectal adenocarcinoma with metastasis to left lower lobe of the lung.  Foundation One on 12/29/2019 revealed MS-stable, 1 Muts/Mb tumor mutational burden; KRAS G12C, PTEN rearrangement intron 8, APC G1106*, T1467f*18, and NRAS wildtype.  There were no reportable mutations in BRAF and NRAS.  She has had post-operative pain and ostomy ischemia. She  underwent surgical ostomy revisionon 11/28/2017. Pathologyshowed focal mucosal erosion and fat necrosis c/w ischemia. There was extensive underlying fat necrosis. No dysplasia or carcinoma was identified. She was treated with oral Augmentin and wound packing. She has been treated with oxycodone 5 mg po q 4 hrs prn, gabapentin 900 mg TID. She was treated with Bactrim on 01/18/2018. She was referred to local pain management clinic on 01/27/2018.  PET scanon 11/29/2019 showed local recurrenceof hypermetabolic nodular lung malignancy at the resection margin(1.6 x 1.5 cm; SUV 11.3)in the LEFT lower lobe. There was no evidence of hepatic metastasis. There was no evidence of local recurrence in pelvis. There was insufficiency fracture in the LEFT sacral ala presumably related to radiation treatment.  Chest CTon 12/13/2019 revealed anenlarging partially calcified2.3 x 1.2 cmlesion in the medial aspect of the left lower lobe most compatible with a pulmonary metastasis given the hypermetabolism on recent PET-CT.There were several other tiny pulmonary nodules measuring 6 mm or less in size, some of whichwere new or increased in size compared to prior examinations.  She declined surgical resection.  Chest, abdomen and pelvis CT on 02/23/2020 revealed a stable 2.6 x 1.7 cm partially calcified left infrahilar pulmonary nodule, hypermetabolic on PET scan and likely metastasis.  There were new no new or enlarging pulmonary nodules.  There were stable treated hepatic metastatic disease with no evidence of progression or local recurrence.  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  There is no evidence of incarceration or obstruction.  There was mild wall thickening of multiple loops of small bowel in the mid abdomen, possibly related to prior radiation therapy.  There was stable chronic sacral insufficiency fractures bilaterally.  There was a stable 3.7  cm fusiform infrarenal abdominal aortic aneurysm.   She is day 8 of cycle #1 FOLFIRI (03/08/2020) with Fulphila support.  [facility] did not appear malignant with no adenomatous (granulation tissue). There was diverticulosis in the descending colon, normal mucosa in the entire examined colon and the examined portion of the ileum. There was a 9 mmlipoma in the transverse colon anda4 mm polyp at the surgical stoma.   She has a B12 deficiency.[id number](low)on 12/03/2019. She restarted B12 injections on 12/13/2019 (last 03/08/2020).  She has folate deficiency.  Folate was 5.8 (low) on 12/03/2019 and 52 on 02/10/2020.  She is on folic acid.    She has chronic hypokalemia.  She is on potassium 20 meq po q day.  [facility] 08/15/2019 for a smallbowel obstruction. She had severe abdominal pain with associated nausea and vomiting. Abdominal and pelvicCT revealedmultiple small bowel loops whichwere dilated.Obstruction resolved spontaneously.  She does not plan to have the COVID-19 vaccine.   Symptomatically, she has had nausea, vomiting, and diarrhea associated with cycle #1 chemotherapy.  Symptoms have resolved.  Appetite remains poor.  Hydration is good.  Exam is unremarkable. Potassium is 3.0.  Plan: 1.   Labs today:  CBC with diff, BMP.  2.Stage IV rectal carcinoma Shereceived6cycles of FOLFOX(02/18/2017 -04/22/2017).  She underwent CT guided microwave ablation of the liver lesionon 06/06/2017.  She receivedradiationand daily Xeloda (06/18/2017-08/15/2017). She underwentAPR with descending colostomy and VRAM flapon 10/24/2017.  She underwent a VATS wedge resectionto her LEFT lung. PET scan on 02/22/2021revealedrecurrent disease at the lung resection margin. Chest, abdomen, and pelvis CT on 02/23/2020 revealed a 2.6 x 1.7  cm left infrahilar nodule.  Patient declined left lower lobe lobectomy. NexGen sequencingconfirmed a KRAS mutation (not eligible for cetuximab). She is day 8 s/p cycle #1 FOLFIRI.     Avastin was held with cycle #1.   She has had issues with nausea, vomiting and diarrhea.    Symptoms appear to have resolved.   Counts are good with Fulphila support.  Discuss symptom management.  Shehas antiemetics at home to use on a prn bases.  Interventions are adequate.     Nausea/vomiting controlled with ondansetron and Ativan.   Discuss importance of hydration regarding nausea. 3. B12 and folate deficiency [id number] (low)on 12/03/2019 and folate 5.8 (low) on 12/03/2019.             She receives B12 monthly (due 04/05/2020).  She remains on oral folic acid.  4.Hypokalemia Potassium3.0today. Etiology secondary to diarrhea (resolved with Imodium).  Potassium chloride 40 meq IV today.  Increase oral potassium from 20 meq a day to 30 meq a day.            5.   Throat discomfort  No obvious oral lesions.  Rx:  Magic mouthwash. 6.   Planned dental work  Patient has a follow-up on 03/20/2020 with detistry.  Avastin held until clearance (no extractions). 7.   RTC on 03/22/2020 for MD assess, labs (CBC with diff, CMP, Mg, CEA), and cycle #2 FOLFIRI.  I discussed the assessment and treatment plan with the patient.  The patient was provided an opportunity to ask questions and all were answered.  The patient agreed with the plan and demonstrated an understanding of the instructions.  The patient was advised to call back if the symptoms worsen or if the condition fails to improve as anticipated.   Lequita Asal, MD, PhD    03/15/2020, 9:55 AM  I, Heywood Footman, am acting as a Education administrator for Lequita Asal, MD.  I, Ebony Mike Gip, MD, have reviewed the above documentation for accuracy and completeness, and I agree with  the above.

## 2020-03-15 ENCOUNTER — Inpatient Hospital Stay: Payer: Medicare HMO

## 2020-03-15 ENCOUNTER — Encounter: Payer: Self-pay | Admitting: Hematology and Oncology

## 2020-03-15 ENCOUNTER — Other Ambulatory Visit: Payer: Self-pay

## 2020-03-15 ENCOUNTER — Inpatient Hospital Stay (HOSPITAL_BASED_OUTPATIENT_CLINIC_OR_DEPARTMENT_OTHER): Payer: Medicare HMO | Admitting: Hematology and Oncology

## 2020-03-15 VITALS — BP 136/65 | HR 89 | Temp 96.4°F | Resp 18 | Wt 191.4 lb

## 2020-03-15 DIAGNOSIS — Z5111 Encounter for antineoplastic chemotherapy: Secondary | ICD-10-CM | POA: Diagnosis not present

## 2020-03-15 DIAGNOSIS — R112 Nausea with vomiting, unspecified: Secondary | ICD-10-CM

## 2020-03-15 DIAGNOSIS — E538 Deficiency of other specified B group vitamins: Secondary | ICD-10-CM

## 2020-03-15 DIAGNOSIS — C7802 Secondary malignant neoplasm of left lung: Secondary | ICD-10-CM | POA: Diagnosis not present

## 2020-03-15 DIAGNOSIS — C20 Malignant neoplasm of rectum: Secondary | ICD-10-CM

## 2020-03-15 DIAGNOSIS — E876 Hypokalemia: Secondary | ICD-10-CM

## 2020-03-15 LAB — BASIC METABOLIC PANEL
Anion gap: 12 (ref 5–15)
BUN: 10 mg/dL (ref 8–23)
CO2: 29 mmol/L (ref 22–32)
Calcium: 8.5 mg/dL — ABNORMAL LOW (ref 8.9–10.3)
Chloride: 94 mmol/L — ABNORMAL LOW (ref 98–111)
Creatinine, Ser: 0.9 mg/dL (ref 0.44–1.00)
GFR calc Af Amer: >60 mL/min (ref >60)
GFR calc non Af Amer: >60 mL/min (ref >60)
Glucose, Bld: 143 mg/dL — ABNORMAL HIGH (ref 70–99)
Potassium: 3 mmol/L — ABNORMAL LOW (ref 3.5–5.1)
Sodium: 135 mmol/L (ref 135–145)

## 2020-03-15 LAB — CBC WITH DIFFERENTIAL/PLATELET
Abs Immature Granulocytes: 0.4 10*3/uL — ABNORMAL HIGH (ref 0.00–0.07)
Basophils Absolute: 0 10*3/uL (ref 0.0–0.1)
Basophils Relative: 0 %
Eosinophils Absolute: 0.1 10*3/uL (ref 0.0–0.5)
Eosinophils Relative: 1 %
HCT: 41.5 % (ref 36.0–46.0)
Hemoglobin: 14.1 g/dL (ref 12.0–15.0)
Immature Granulocytes: 3 %
Lymphocytes Relative: 13 %
Lymphs Abs: 1.9 10*3/uL (ref 0.7–4.0)
MCH: 31 pg (ref 26.0–34.0)
MCHC: 34 g/dL (ref 30.0–36.0)
MCV: 91.2 fL (ref 80.0–100.0)
Monocytes Absolute: 1.3 10*3/uL — ABNORMAL HIGH (ref 0.1–1.0)
Monocytes Relative: 9 %
Neutro Abs: 11.1 10*3/uL — ABNORMAL HIGH (ref 1.7–7.7)
Neutrophils Relative %: 74 %
Platelets: 176 10*3/uL (ref 150–400)
RBC: 4.55 MIL/uL (ref 3.87–5.11)
RDW: 12.7 % (ref 11.5–15.5)
WBC: 14.7 10*3/uL — ABNORMAL HIGH (ref 4.0–10.5)
nRBC: 0.2 % (ref 0.0–0.2)

## 2020-03-15 LAB — MAGNESIUM: Magnesium: 1.9 mg/dL (ref 1.7–2.4)

## 2020-03-15 LAB — URINALYSIS, DIPSTICK ONLY
Bilirubin Urine: NEGATIVE
Glucose, UA: NEGATIVE mg/dL
Hgb urine dipstick: NEGATIVE
Ketones, ur: NEGATIVE mg/dL
Nitrite: NEGATIVE
Protein, ur: NEGATIVE mg/dL
Specific Gravity, Urine: 1.02 (ref 1.005–1.030)
pH: 5 (ref 5.0–8.0)

## 2020-03-15 MED ORDER — HEPARIN SOD (PORK) LOCK FLUSH 100 UNIT/ML IV SOLN
500.0000 [IU] | Freq: Once | INTRAVENOUS | Status: AC
  Administered 2020-03-15: 500 [IU] via INTRAVENOUS
  Filled 2020-03-15: qty 5

## 2020-03-15 MED ORDER — ONDANSETRON 8 MG PO TBDP: 8.0000 mg | ORAL_TABLET | Freq: Three times a day (TID) | ORAL | 6 refills | Status: DC | PRN

## 2020-03-15 MED ORDER — SODIUM CHLORIDE 0.9% FLUSH
10.0000 mL | INTRAVENOUS | Status: DC | PRN
  Administered 2020-03-15: 10 mL via INTRAVENOUS
  Filled 2020-03-15: qty 10

## 2020-03-15 MED ORDER — SODIUM CHLORIDE 0.9 % IV SOLN
Freq: Once | INTRAVENOUS | Status: AC
  Filled 2020-03-15: qty 20

## 2020-03-15 MED ORDER — SODIUM CHLORIDE 0.9 % IV SOLN
Freq: Once | INTRAVENOUS | Status: AC
  Filled 2020-03-15: qty 250

## 2020-03-15 MED ORDER — MAGIC MOUTHWASH
5.0000 mL | Freq: Four times a day (QID) | ORAL | 1 refills | Status: DC | PRN
Start: 2020-03-15 — End: 2020-03-20

## 2020-03-20 ENCOUNTER — Telehealth: Payer: Self-pay | Admitting: *Deleted

## 2020-03-20 ENCOUNTER — Other Ambulatory Visit: Payer: Self-pay | Admitting: Hematology and Oncology

## 2020-03-20 MED ORDER — MAGIC MOUTHWASH: 5.0000 mL | Freq: Four times a day (QID) | ORAL | 1 refills | Status: DC | PRN

## 2020-03-20 NOTE — Telephone Encounter (Signed)
I called pt and asked about soreness I her mouth or difficulty swallowing. Dr. Mike Gip had send magic mouth wash to pharmacy last week when she was having pain and the pharmacy sent it back because they do not have a formulary mix and we have to send the exact ingredients for the MMW.  Patient states that she does not have any issues with swallowing and no pain in the mouth and the redness is gone.

## 2020-03-21 ENCOUNTER — Emergency Department
Admission: EM | Admit: 2020-03-21 | Discharge: 2020-03-21 | Disposition: A | Discharge status: Home / Self Care | Payer: Medicare HMO | Attending: Emergency Medicine | Admitting: Emergency Medicine

## 2020-03-21 ENCOUNTER — Other Ambulatory Visit: Payer: Self-pay

## 2020-03-21 ENCOUNTER — Encounter: Payer: Self-pay | Admitting: Medical Oncology

## 2020-03-21 DIAGNOSIS — R112 Nausea with vomiting, unspecified: Secondary | ICD-10-CM | POA: Diagnosis not present

## 2020-03-21 DIAGNOSIS — R109 Unspecified abdominal pain: Secondary | ICD-10-CM | POA: Insufficient documentation

## 2020-03-21 DIAGNOSIS — Z5321 Procedure and treatment not carried out due to patient leaving prior to being seen by health care provider: Secondary | ICD-10-CM | POA: Insufficient documentation

## 2020-03-21 LAB — COMPREHENSIVE METABOLIC PANEL
ALT: 14 U/L (ref 0–44)
AST: 21 U/L (ref 15–41)
Albumin: 3.9 g/dL (ref 3.5–5.0)
Alkaline Phosphatase: 113 U/L (ref 38–126)
Anion gap: 14 (ref 5–15)
BUN: 14 mg/dL (ref 8–23)
CO2: 30 mmol/L (ref 22–32)
Calcium: 9.2 mg/dL (ref 8.9–10.3)
Chloride: 93 mmol/L — ABNORMAL LOW (ref 98–111)
Creatinine, Ser: 0.98 mg/dL (ref 0.44–1.00)
GFR calc Af Amer: >60 mL/min (ref >60)
GFR calc non Af Amer: >60 mL/min (ref >60)
Glucose, Bld: 114 mg/dL — ABNORMAL HIGH (ref 70–99)
Potassium: 3.3 mmol/L — ABNORMAL LOW (ref 3.5–5.1)
Sodium: 137 mmol/L (ref 135–145)
Total Bilirubin: 0.8 mg/dL (ref 0.3–1.2)
Total Protein: 7.2 g/dL (ref 6.5–8.1)

## 2020-03-21 LAB — CBC
HCT: 43.7 % (ref 36.0–46.0)
Hemoglobin: 15.1 g/dL — ABNORMAL HIGH (ref 12.0–15.0)
MCH: 31.3 pg (ref 26.0–34.0)
MCHC: 34.6 g/dL (ref 30.0–36.0)
MCV: 90.5 fL (ref 80.0–100.0)
Platelets: 178 10*3/uL (ref 150–400)
RBC: 4.83 MIL/uL (ref 3.87–5.11)
RDW: 13.1 % (ref 11.5–15.5)
WBC: 8.8 10*3/uL (ref 4.0–10.5)
nRBC: 0 % (ref 0.0–0.2)

## 2020-03-21 LAB — LIPASE, BLOOD: Lipase: 30 U/L (ref 11–51)

## 2020-03-21 NOTE — ED Notes (Signed)
Pt called from [facility] to treatment room, no response 

## 2020-03-21 NOTE — ED Triage Notes (Signed)
Pt reports that she has been vomiting x 2 days with some lower abd cramping. Pt has St 4 colon cancer with ostomy in place. Pt began first chemo treatment last week.

## 2020-03-21 NOTE — ED Triage Notes (Signed)
Pt in via EMS from home with c/o abd pain for 3 days and some NV. Pt with ostomy site as well. Pt with hx of the same, reports feels like colon spasms. Pt with left AC #18g IV, EMS administered 250 bolus of NS.

## 2020-03-22 ENCOUNTER — Ambulatory Visit: Payer: Medicare HMO

## 2020-03-22 ENCOUNTER — Other Ambulatory Visit: Payer: Medicare HMO

## 2020-03-22 ENCOUNTER — Ambulatory Visit: Payer: Medicare HMO | Admitting: Hematology and Oncology

## 2020-03-22 ENCOUNTER — Telehealth: Payer: Self-pay

## 2020-03-22 NOTE — Telephone Encounter (Signed)
-----   Message from [place] sent at 03/22/2020 11:10 AM EDT ----- Regarding: Admission to [facility] Pt's sister called and said she went to the ED last night and ended up being transferred to [facility] early this morning, and was immediately taken for an exploratory laparotomy b/c of a poss. 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 She wanted to make sure you were aware of the situation, and that she would not at her appts today. Should I r/s her appts? If so how far out should I make them? Thanks!

## 2020-03-23 ENCOUNTER — Telehealth: Payer: Self-pay | Admitting: Hematology and Oncology

## 2020-03-23 ENCOUNTER — Telehealth: Payer: Self-pay

## 2020-03-23 NOTE — Telephone Encounter (Signed)
Re:  Surgery  Spoke with Louann Liv [phone]) regarding patient's admission to [facility] for an incarcerated hernia at the stoma and small bowel obstruction.  She has undergone emergent surgery.  She is doing well.  They are awaiting bowel function to return.  Plan to hold Avastin secondary to recent surgery.   Lequita Asal, MD

## 2020-03-23 NOTE — Telephone Encounter (Signed)
Please call Tamara Hall at [phone]   Patient is currently admitted at [facility].  They would like to discuss with Dr. Mike Gip if there is anything they can/should do for patient while admitted.

## 2020-03-31 ENCOUNTER — Inpatient Hospital Stay: Payer: Medicare HMO

## 2020-04-06 ENCOUNTER — Inpatient Hospital Stay: Payer: Medicare HMO

## 2020-04-06 ENCOUNTER — Inpatient Hospital Stay: Payer: Medicare HMO | Admitting: Hematology and Oncology

## 2020-04-12 NOTE — Progress Notes (Signed)
[facility]  [address], [address] [place], [place] [address] Phone: [phone]  Fax: [phone]   Clinic Day:  04/13/2020  Referring physician: Loni Muse, MD  Chief Complaint: Tamara Hall is a 64 y.o. female with stage IV rectal cancer s/p microwave hepatic ablation, APR with colostomy, and VATS wedge resectionwho is seen for assessment after interval hospitalization.  HPI: The patient was last seen in the medical oncology clinic on 03/15/2020. At that time, she was seen for nadir assessment s/p cycle #1 FOLFIRI.  She described nausea, vomiting, and diarrhea after cycle #1.  Symptoms were resolving.  Appetite was poor.  Hydration status was good; weight was up 4 pounds.  Hematocrit 41.5, hemoglobin 14.1, platelets 176,000, WBC 14,700 (ANC 11,100). Potassium was 3.0. She received IV potassium 40 meq IV.  Oral potassium was increased.  She saw Daun Peacock, FNP on 03/16/2020 for pain management. She had lower back and SI joint pain. She reported nausea, vomiting, and diarrhea related to chemotherapy. She was not sleeping well. She was recommended OTC medication for difficulty sleeping. Follow up was planned for 3 months.  She was seen in the [facility] ER on 03/21/2020.  Patient arrived via EMS with abdominal pain x 3 days with some nausea and vomiting.  She received IVF.  She left the ER before being seen because she was told it would be 2 days before she could be seen by a physician. Her sister took her home but her condition kept worsening.  Her sister then took her to the [facility] ER for the same symptoms.  She was transferred to [facility] and was admitted from 03/22/2020 - 03/28/2020. She presented with an incarcerated hernia and underwent parastomal hernia repair with no bowel resection on 03/22/2020 by Dr Edwyna Shell.  Abdomen and pelvis CT on 03/22/2020 revealed a high-grade small bowel obstruction. There were 2 separate loops of small bowel contained  within the parastomal hernia. The first was more proximal small bowel which appeared dilated but nonobstructed. The second segment of included small bowel was more distal small bowel which appeared dilated entering into the herniaand collapsed exiting the hernia, consistent with point of obstruction.  There was a 3.7 cm infrarenal abdominal aortic aneurysm with extensive mural thrombus, similar in size compared with previous examination, although degree of mural thrombus had increased. Additional calcified atherosclerotic plaque was noted throughout the abdominal aorta and its branches.   CBC followed: 03/21/2020: Hematocrit 43.7, hemoglobin 15.1, platelets 178,000, WBC 8,800. 03/22/2020: Hematocrit 39.2, hemoglobin 13.0, platelets 183,000, WBC 14,300.  03/23/2020: Hematocrit 36.4, hemoglobin 12.4, platelets 217,000, WBC 11,000. 03/26/2020: Hematocrit 32.4, hemoglobin 11.0, platelets 228,000, WBC 5,100.  During the interim, she has gone through a lot. She had her staples removed a couple of days ago. She notes that food is still very "strange tasting" but her appetite is good. She has lower back pain and is unable to walk in public without her walker. The patient has decided that she does not want anymore chemotherapy. She still does not want surgery on her lung. She would consider radiation.   She continues to have spells of abdominal pain even after her surgery. They last for 2-5 days. Usually during these episodes, she still has output in her ostomy, but she did not during her most recent episode. She thought she was going to have another episode of pain this morning, but she did not.  The patient was supposed to have an appointment for dental work on 03/20/2020 but  missed it due to her abdominal pain. She is planning to have one tooth pulled and two filled. She needs another doctor's note before she can proceed with the procedure.   Past Medical History:  Diagnosis Date  . Cancer ([place])     colon, with mets to liver and left lung  . Hypertension   . Pneumonia    20+ years ago    Past Surgical History:  Procedure Laterality Date  . ABDOMINAL HYSTERECTOMY    . BREAST SURGERY    . COLON SURGERY  10/2017   [facility]  . COLONOSCOPY WITH PROPOFOL N/A 01/14/2017   Procedure: COLONOSCOPY WITH PROPOFOL;  Surgeon: Lucilla Lame, MD;  Location: [facility] ENDOSCOPY;  Service: Endoscopy;  Laterality: N/A;  . ELBOW SURGERY Right    Has had 4 surgeries on right elbow.  Marland Kitchen HAND SURGERY Right 2008  . JOINT REPLACEMENT    . LIVER BIOPSY  10/2017  . lung wedge resection  10/2017  . PORTA CATH INSERTION N/A 01/29/2017   Procedure: Glori Luis Cath Insertion;  Surgeon: Algernon Huxley, MD;  Location: [facility];  Service: Cardiovascular;  Laterality: N/A;  . TONSILLECTOMY      Family History  Problem Relation Age of Onset  . Lung cancer Mother 46  . Esophageal cancer Father 30  . Brain cancer Maternal Grandmother     Social History:  reports that she quit smoking about 2 years ago. Her smoking use included cigarettes. She has a 80.00 pack-year smoking history. She has never used smokeless tobacco. She reports that she does not drink alcohol and does not use drugs. She was smoking 1/2 packs/day.She lives in [place]. She has 3 children (ages 69, 73, and 32). She smoked 2 packs a day. She started smoking at age 57. She initially stopped smoking on 10/24/2017. 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 [name] is her significant other. The patient is alone today.   Allergies: No Known Allergies  Current Medications: Current Outpatient Medications  Medication Sig Dispense Refill  . albuterol (VENTOLIN HFA) 108 (90 Base) MCG/ACT inhaler Inhale into the lungs.    Marland Kitchen amLODipine (NORVASC) 5 MG tablet Take 5 mg by mouth daily.    . baclofen (LIORESAL) 10 MG tablet Take 10 mg by mouth 3 (three) times daily.    Marland Kitchen dexamethasone (DECADRON) 4 MG tablet Take  2 tablets (8 mg total) by mouth daily. Start the day after chemo for 2 days. 8 tablet 5  . folic acid (FOLVITE) 1 MG tablet TAKE 1 TABLET BY MOUTH EVERY DAY 90 tablet 1  . gabapentin (NEURONTIN) 300 MG capsule Take 1,200 mg by mouth 3 (three) times daily.     . hydrochlorothiazide (HYDRODIURIL) 25 MG tablet Take 25 mg by mouth daily.    Marland Kitchen HYDROcodone-acetaminophen (NORCO/VICODIN) 5-325 MG tablet Take 1 tablet by mouth every 6 (six) hours as needed for moderate pain. 15 tablet 0  . loperamide (IMODIUM A-D) 2 MG tablet Take 2 at diarrhea onset , then 1 every 2hr until 12hrs with no BM. May take 2 every 4hrs at night. If diarrhea recurs repeat. 100 tablet 1  . LORazepam (ATIVAN) 0.5 MG tablet Take 1 tablet (0.5 mg total) by mouth every 6 (six) hours as needed (NAUSEA). 30 tablet 0  . nortriptyline (PAMELOR) 10 MG capsule Take 10 mg by mouth at bedtime.    . ondansetron (ZOFRAN ODT) 8 MG disintegrating tablet Take  1 tablet (8 mg total) by mouth every 8 (eight) hours as needed for nausea or vomiting. 20 tablet 6  . ondansetron (ZOFRAN) 8 MG tablet Take by mouth.    . Ostomy Supplies (PREMIER DRAINABLE POUCH [phone]) Pouch MISC     . potassium chloride (KLOR-CON M10) 10 MEQ tablet Take 1 tablet (10 mEq total) by mouth daily. 60 tablet 1  . magic mouthwash SOLN Take 5 mLs by mouth 4 (four) times daily as needed for mouth pain. (Patient not taking: Reported on 04/12/2020) 300 mL 1   No current facility-administered medications for this visit.    Review of Systems  Constitutional: Positive for weight loss (4 lb). Negative for chills, diaphoresis, fever and malaise/fatigue.  HENT: Negative.  Negative for congestion, ear discharge, ear pain, hearing loss, nosebleeds, sinus pain, sore throat and tinnitus.   Eyes: Negative.  Negative for blurred vision and double vision.  Respiratory: Negative.  Negative for cough, sputum production and shortness of breath.   Cardiovascular: Negative.  Negative for chest pain,  palpitations and leg swelling.  Gastrointestinal: Positive for abdominal pain (intermittent labor-like pain; episodes last 4-5 days). Negative for blood in stool, constipation, diarrhea, heartburn, melena, nausea and vomiting.       S/p parastomal hernia repair on 03/22/2020. Appetite is good. Food is "strange tasting."  Genitourinary: Negative.  Negative for dysuria, frequency and urgency.  Musculoskeletal: Positive for back pain. Negative for falls, joint pain, myalgias and neck pain.  Skin: Negative.  Negative for itching and rash.  Neurological: Negative.  Negative for dizziness, sensory change, speech change, weakness and headaches.  Endo/Heme/Allergies: Negative.  Negative for environmental allergies. Does not bruise/bleed easily.  Psychiatric/Behavioral: Negative.  Negative for depression and memory loss. The patient is not nervous/anxious and does not have insomnia.   All other systems reviewed and are negative.  Performance status (ECOG): 1  Vitals Blood pressure 117/60, pulse 85, temperature (!) 97.5 F (36.4 C), temperature source Tympanic, resp. rate 18, height 5' 7"  (1.702 m), weight 187 lb 13.3 oz (85.2 kg), SpO2 98 %.   Physical Exam Vitals and nursing note reviewed.  Constitutional:      General: She is not in acute distress.    Appearance: Normal appearance. She is well-developed. She is not diaphoretic.     Interventions: Face mask in place.     Comments: She has a rolling walker by her side. She required assistance on and off the exam room table.   HENT:     Head: Normocephalic and atraumatic.     Right Ear: External ear normal.     Mouth/Throat:     Mouth: Mucous membranes are moist. No oral lesions.     Pharynx: Oropharynx is clear.  Eyes:     General: No scleral icterus.       Right eye: No discharge.        Left eye: No discharge.     Conjunctiva/sclera: Conjunctivae normal.     Pupils: Pupils are equal, round, and reactive to light.     Comments: Blue eyes.   Neck:     Vascular: No JVD.  Cardiovascular:     Rate and Rhythm: Normal rate and regular rhythm.     Heart sounds: Normal heart sounds. No murmur heard.  No friction rub. No gallop.   Pulmonary:     Effort: Pulmonary effort is normal. No respiratory distress.     Breath sounds: Normal breath sounds. No wheezing, rhonchi or rales.  Chest:  Chest wall: No tenderness.  Abdominal:     General: Bowel sounds are normal. There is no distension.     Palpations: Abdomen is soft. There is no mass.     Tenderness: There is no abdominal tenderness. There is no guarding or rebound.     Comments: Colostomy site unremarkable.  Musculoskeletal:        General: No swelling or tenderness. Normal range of motion.     Cervical back: Normal range of motion and neck supple.  Lymphadenopathy:     Head:     Right side of head: No preauricular, posterior auricular or occipital adenopathy.     Left side of head: No preauricular, posterior auricular or occipital adenopathy.     Cervical: No cervical adenopathy.     Right cervical: No superficial cervical adenopathy.    Left cervical: No superficial cervical adenopathy.     Upper Body:     Right upper body: No supraclavicular or axillary adenopathy.     Left upper body: No supraclavicular or axillary adenopathy.     Lower Body: No right inguinal adenopathy. No left inguinal adenopathy.  Skin:    General: Skin is warm and dry.     Coloration: Skin is not pale.     Findings: No bruising, erythema, lesion or rash.     Comments: Midline vertical incision healed well.  Neurological:     Mental Status: She is alert and oriented to person, place, and time.  Psychiatric:        Behavior: Behavior normal.        Thought Content: Thought content normal.        Judgment: Judgment normal.    Imaging studies: 01/13/2017:Abdomen and pelvic CTrevealed irregular thickening of the rectum suspicious for carcinoma. There was a 1.6 cm hypoattenuating lesion in  the right hepatic lobe worrisome for metastatic disease. 01/15/2017:Chest CT revealed a 9 mm noncalcified left lower lobe pulmonary nodule (new). 01/16/2017:Pelvic MRIrevealed rectal adenocarcinoma T3N1. There was extension beyond the muscularis propria (17 mm). There were mesorectal nodes >= 5 mm. There was adenopathy on the left side of the mesorectum, including a 9 mm lesion. The distance from the tumor to the anal sphincter was 8 mm. 01/27/2017:PET scanrevealed intense metabolic activity associated rectal mass consists with primary rectal carcinoma. There was a hypermetabolic metastatic lesion to the central LEFT hepatic lobe. There was metabolic activity associated with small LEFT lower lobe nodule which was most consistent with pulmonary metastasis. 01/31/2017:Liver MRIrevealed a 2.6 x 2.7 x 2.1 cm lesion in segment VIII of the liver near the junction with segment IVa c/w a metastatic lesion. There was a 1.1 x 0.8 cm suspected metastatic lesion peripherally in segment VI of the liver.  05/16/2017:Abdomen and pelvic CTrevealed persistent but decreased asymmetric wall thickening in the rectum. The medial segment left liver lesion was smaller (2.7 x 2.6 cm to 1.9 x 1.6 cm). A tiny lesion seen in segment VI of the liver on previous MRI was not evident on today's CT scan. The left lower lobe pulmonary nodule was smaller (9 mm to 6 mm). There was no new or progressive findings in the abdomen and pelvis. 08/19/2017:Chest CTwith contrast on 08/19/2017 revealed left lower lobe 6 mm solid pulmonary nodule, decreased since 01/15/2017 chest CT. Stable 7 mm right lower lobe pulmonary nodule. There was no new or progressive metastatic disease in the chest.  09/03/2018: Pelvic MRI at [facility] demonstrated a partial response of the primary tumor and extramural disease. Post  treatment category ymrT T3b. There was an overall decrease in size in the previously seen perirectal node. Hypointense  changes at the site of prior tumor focally contacedt buts does not definitely invade the LEFT levator ani. Tumor contacted, but does not definitely invade the internal anal sphincter. No evidence of residual or recurrent hepatic disease following microwave ablation. 02/17/2018:Abdomen and pelvic CTrevealed evolution of the ablation zone in segment 4A, now measuring 3.7 x 3.1 cm, decreased.There was no findings to suggest viable tumor.She is s/pabdominoperineal resection with left lower quadrant colostomy. There was no evidence of new/progressive metastatic disease.She is s/pleft lower lobe pulmonary wedge resection, incompletely visualized. 03/04/2018:Chest CTrevealed a stable chest CT (compared to11/13/2018). The index right lower lobe lung nodule measured7 mm (unchanged). There was no new or progressive nodularity identified. There was continued decrease in size of ablation defect within segment 4a of the liver. 08/13/2019:[facility]dominaland [facility] of parastomal hernia with small bowel as well as multiple small bowel loops whichwere dilated.There was hepatic steatosis and post-treatment changes in the pelvis with persistent presacral soft tissue. There was a 3.5 cm infrarenal abdominal aortic aneurysm. 09/09/2019:Chest CT at [facility] no intrathoracic metastasis and no new or enlarging pulmonary nodules. 09/09/2019:Abdomen and pelvis CT at [facility] multiple subcentimeter hypodensities in the dome of the liver, which appeared new/more conspicuous from prior CT. There were not seen on the prior MRI. Further evaluation with MRI was recommended. CT evaluationwas limited due to background fatty liver and the heterogeneity/these lesions could have been secondary to fatty liver. There was unchanged soft tissue stranding and nodularity in the pelvis, with a few prominent more nodular areas. There were a few more prominent indeterminateareasand short-term  follow up was recommended to ensure stability and exclude peritoneal disease. Further evaluation with MRI could have been helpful. 11/12/2019: Liver and pelvis MRIrevealedno findings to explain the patient's history of rising CEA. There was irregular presacral soft tissue with apparent scarring noted in the left paracentral pelviswas similar to prior studies and presumably treatment related. PET-CT could be used, as clinically warranted to exclude hypermetabolic disease within thatregion. There was an ablation defect identified in segment IV with minimal irregular enhancement along the anterosuperior margin, indeterminate. Attention on follow-up recommended.There was a tiny cyst identified in the dome of the right liver without other findings to suggest metastatic disease in the hepatic parenchyma. 11/29/2019:PET scanrevealed 1.6 x 1.5 cmnodularthickeningat the resection margin (SUV 11.3)in the LEFT lower lobe. There was no evidence of hepatic metastasis. There was no evidence of local recurrence in pelvis. There was insufficiency fracture in the LEFT sacral ala presumably related to radiation treatment. 02/23/2020:  Chest, abdomen and pelvis CT revealed a stable 2.6 x 1.7 cm partially calcified left infrahilar pulmonary nodule, hypermetabolic on PET scan and likely metastasis.  There were new no new or enlarging pulmonary nodules.  There were stable treated hepatic metastatic disease with no evidence of progression or local recurrence.  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  There is no evidence of incarceration or obstruction.  There was mild wall thickening of multiple loops of small bowel in the mid abdomen, possibly related to prior radiation therapy.  There was stable chronic sacral insufficiency fractures bilaterally.  There was a stable 3.7 cm fusiform infrarenal abdominal aortic aneurysm.  03/22/2020:  Abdomen and pelvis CT at [facility]  revealed a high-grade small bowel obstruction. There were 2 separate loops of small bowel contained within the parastomal hernia. The first was  more proximal small bowel which appeared dilated but nonobstructed. The second segment of included small bowel was more distal small bowel which appeared dilated entering into the herniaand collapsed exiting the hernia, consistent with point of obstruction.  There was a 3.7 cm infrarenal abdominal aortic aneurysm with extensive mural thrombus, similar in size compared with previous examination, although degree of mural thrombus had increased. Additional calcified atherosclerotic plaque was noted throughout the abdominal aorta and its branches.    Appointment on 04/13/2020  Component Date Value Ref Range Status  . Magnesium 04/13/2020 1.6* 1.7 - 2.4 mg/dL Final   Performed at [facility], [address], [place], [place] [address]  . Sodium 04/13/2020 135  135 - 145 mmol/L Final  . Potassium 04/13/2020 3.2* 3.5 - 5.1 mmol/L Final  . Chloride 04/13/2020 98  98 - 111 mmol/L Final  . CO2 04/13/2020 28  22 - 32 mmol/L Final  . Glucose, Bld 04/13/2020 116* 70 - 99 mg/dL Final   Glucose reference range applies only to samples taken after fasting for at least 8 hours.  . BUN 04/13/2020 7* 8 - 23 mg/dL Final  . Creatinine, Ser 04/13/2020 0.83  0.44 - 1.00 mg/dL Final  . Calcium 04/13/2020 8.7* 8.9 - 10.3 mg/dL Final  . Total Protein 04/13/2020 6.6  6.5 - 8.1 g/dL Final  . Albumin 04/13/2020 3.5  3.5 - 5.0 g/dL Final  . AST 04/13/2020 15  15 - 41 U/L Final  . ALT 04/13/2020 12  0 - 44 U/L Final  . Alkaline Phosphatase 04/13/2020 107  38 - 126 U/L Final  . Total Bilirubin 04/13/2020 0.3  0.3 - 1.2 mg/dL Final  . GFR calc non Af Amer 04/13/2020 >60  >60 mL/min Final  . GFR calc Af Amer 04/13/2020 >60  >60 mL/min Final  . Anion gap 04/13/2020 9  5 - 15 Final   Performed at [facility], [address] [place], [place] [address]  .  WBC 04/13/2020 6.4  4.0 - 10.5 K/uL Final  . RBC 04/13/2020 3.85* 3.87 - 5.11 MIL/uL Final  . Hemoglobin 04/13/2020 12.1  12.0 - 15.0 g/dL Final  . HCT 04/13/2020 35.0* 36 - 46 % Final  . MCV 04/13/2020 90.9  80.0 - 100.0 fL Final  . MCH 04/13/2020 31.4  26.0 - 34.0 pg Final  . MCHC 04/13/2020 34.6  30.0 - 36.0 g/dL Final  . RDW 04/13/2020 13.5  11.5 - 15.5 % Final  . Platelets 04/13/2020 322  150 - 400 K/uL Final  . nRBC 04/13/2020 0.0  0.0 - 0.2 % Final  . Neutrophils Relative % 04/13/2020 68  % Final  . Neutro Abs 04/13/2020 4.4  1.7 - 7.7 K/uL Final  . Lymphocytes Relative 04/13/2020 21  % Final  . Lymphs Abs 04/13/2020 1.3  0.7 - 4.0 K/uL Final  . Monocytes Relative 04/13/2020 7  % Final  . Monocytes Absolute 04/13/2020 0.5  0 - 1 K/uL Final  . Eosinophils Relative 04/13/2020 3  % Final  . Eosinophils Absolute 04/13/2020 0.2  0 - 0 K/uL Final  . Basophils Relative 04/13/2020 0  % Final  . Basophils Absolute 04/13/2020 0.0  0 - 0 K/uL Final  . Immature Granulocytes 04/13/2020 1  % Final  . Abs Immature Granulocytes 04/13/2020 0.04  0.00 - 0.07 K/uL Final   Performed at [facility], [address], [place], [place] [address]    Assessment:  JAKHIYA BROWER is  a 63 y.o. female with clinical stage T3N1M1rectal cancer. She presented with a 9 month history of rectal bleeding. Colonoscopyon 01/14/2017 revealed afrond-like/villous non-obstructing large mass in the rectum. The mass was non-circumferential. Biopsies revealed high grade dysplasia within an adenoma (? sampling error). CEAwas 31.6 on 01/15/2017.  PET scanon 01/27/2017 revealed intense metabolic activity associated rectal mass consists with primary rectal carcinoma. There was a hypermetabolic metastatic lesion to the central LEFT hepatic lobe. There was metabolic activity associated with small LEFT lower lobe nodule which was most consistent with pulmonary metastasis.  CT guided liver biopsyat [facility] on  02/07/2017 revealed malignant cells c/w metastatic colorectal adenocarcinoma. [facility] on 12/29/2019 [phone]) revealed MS-stable, 1 Muts/Mb tumor mutational burden; KRAS G12C, PTEN rearrangement intron 8, APC G1106*, [id number], and NRAS wildtype.  There were no reportable mutations in BRAF and NRAS.     CEAhas been followed: 31.6 on 01/15/2017, 38.1 on 02/18/2017, 25.1 on 03/18/2017, 11.8 on 04/08/2017, 9.2 on 04/22/2017, 9.5 on 05/20/2017, 9.6 on 06/19/2017, 3.7 on 02/18/2018,5.8 on 06/26/2018, 10.5 on 11/20/2018, 19.5 on 03/26/2019, 34.0 on 07/21/2019, 46.4 on 09/09/2019, 54.4 on 10/28/2019, 108.0 on 02/10/2020, and 121.0 on 03/08/2020.  She received 2 cycles of FOLFOX (02/18/2017 - 03/04/2017). She developed transient cold induced neuropathy. She developed wheezing and hypoxia with cycle #2 felt secondary to oxaliplatin. She received 4 cycles ofFOLFIRI(03/24/2017 - 04/22/2017) with Neulasta support. Diarrhea was controlled with Imodium.  She underwent CT guided microwave ablation of the liver lesionon 06/06/2017.   She began radiationon 06/18/2017. She receiveddaily XMelody Haverradiation. Radiation and Xeloda completed on 08/15/2017.  She underwentAPR with descending colostomy and VRAM flapon 10/24/2017. She underwent a VATS wedge resectionto her LEFT lung. Pathology from the lung demonstrateda 1 cmmetastatic intestinal type adenocarcinoma c/w metastasis from patient's known colorectal carcinoma.Pathology from the rectal resection revealed a 2.1 cm invasive moderately differentiated adenocarcinoma arising in a tubular adenoma with transmural invasion focally into the perirectal soft tissue. There was no lymphovascular or perineural invasion. Margins were negative. Zero of 20 lymph nodes were positive. Final diagnosiswas [id number] adenocarcinoma with metastasis to left lower lobe of the lung.  [facility] on 12/29/2019 revealed MS-stable, 1  Muts/Mb tumor mutational burden; KRAS G12C, PTEN rearrangement intron 8, APC G1106*, [id number], and NRAS wildtype.  There were no reportable mutations in BRAF and NRAS.  She has had post-operative pain and ostomy ischemia. She underwent surgical ostomy revisionon 11/28/2017. Pathologyshowed focal mucosal erosion and fat necrosis c/w ischemia. There was extensive underlying fat necrosis. No dysplasia or carcinoma was identified. She was treated with oral Augmentin and wound packing. She has been treated with oxycodone 5 mg po q 4 hrs prn, gabapentin 900 mg TID. She was treated with Bactrim on 01/18/2018. She was referred to local pain management clinic on 01/27/2018.  PET scanon 11/29/2019 showed local recurrenceof hypermetabolic nodular lung malignancy at the resection margin(1.6 x 1.5 cm; SUV 11.3)in the LEFT lower lobe. There was no evidence of hepatic metastasis. There was no evidence of local recurrence in pelvis. There was insufficiency fracture in the LEFT sacral ala presumably related to radiation treatment.  Chest CTon 12/13/2019 revealed anenlarging partially calcified2.3 x 1.2 cmlesion in the medial aspect of the left lower lobe most compatible with a pulmonary metastasis given the hypermetabolism on recent PET-CT.There were several other tiny pulmonary nodules measuring 6 mm or less in size, some of whichwere new or increased in size compared to prior examinations.  She declined surgical resection.  Chest, abdomen and pelvis CT on  02/23/2020 revealed a stable 2.6 x 1.7 cm partially calcified left infrahilar pulmonary nodule, hypermetabolic on PET scan and likely metastasis.  There were new no new or enlarging pulmonary nodules.  There were stable treated hepatic metastatic disease with no evidence of progression or local recurrence.  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  There is no evidence of incarceration or  obstruction.  There was mild wall thickening of multiple loops of small bowel in the mid abdomen, possibly related to prior radiation therapy.  There was stable chronic sacral insufficiency fractures bilaterally.  There was a stable 3.7 cm fusiform infrarenal abdominal aortic aneurysm.   She is s/p 1 cycle of FOLFIRI (03/08/2020) with Fulphila support.  Cycle #1 was notable for nausea, vomiting, and diarrhea.  She declined further chemotherapy.  Colonoscopy at [facility] 09/13/2019 did not appear malignant with no adenomatous (granulation tissue). There was diverticulosis in the descending colon, normal mucosa in the entire examined colon and the examined portion of the ileum. There was a 9 mmlipoma in the transverse colon anda4 mm polyp at the surgical stoma.   She has a B12 deficiency.[id number] (low)on 12/03/2019. She restarted B12 injections on 12/13/2019 (last 03/08/2020).  She has folate deficiency.  Folate was 5.8 (low) on 12/03/2019 and 52 on 02/10/2020.  She is on folic acid.    She has chronic hypokalemia.  She is on potassium 20 meq po q day.  [facility] a smallbowel obstruction. She had severe abdominal pain with associated nausea and vomiting. Abdominal and pelvicCT revealedmultiple small bowel loops whichwere dilated.Obstruction resolved spontaneously.  She was admitted to [facility] from 03/22/2020 - 03/28/2020. She presented with an incarcerated hernia and underwent parastomal hernia repair with no bowel resection on 03/22/2020.  There was a 3.7 cm infrarenal abdominal aortic aneurysm with extensive mural thrombus, similar in size compared with previous examination, although degree of mural thrombus had increased.  She does not plan to have the COVID-19 vaccine.   Symptomatically, she has intermittent abdominal pain.  She denies any respiratory symptoms.  Plan: 1.   Labs today:  CBC with diff, CMP, Mg, CEA. 2.Stage IV  rectal carcinoma Shereceived6cycles of FOLFOX(02/18/2017 -04/22/2017).  She underwent CT guided microwave ablation of the liver lesionon 06/06/2017.  She receivedradiationand daily Xeloda (06/18/2017-08/15/2017). She underwentAPR with descending colostomy and VRAM flapon 10/24/2017.  Patient underwent a VATS wedge resection of her left lung. 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 Chest, abdomen and pelvis CT on 02/23/2020 revealed a 2.6 x 1.7 cm left infrahilar nodule. Patient declined left lower lobe lobectomy. NexGen sequencingconfirmed a KRAS mutation (not eligible for cetuximab). She is s/p cycle #1 FOLFIRI (03/08/2020).                           Avastin was held with cycle #1.                         She had issues with nausea, vomiting and diarrhea.                         Counts were good with Fulphila support.             Patient has undergone an interval incarcerated bowel surgery.  She has decided against further chemotherapy or surgery.  Discuss consideration of radiation (SBRT)  to left infrahilar nodule.   Multiple questions asked and answered.  Referral to radiation oncology. 3. B12 and folate deficiency [id number] (low)on 12/03/2019 and folate 5.8 (low) on 12/03/2019. She receives B12 monthly (last 03/08/2020).             She is on folic acid.            4.Hypokalemia Potassium3.2today. She is on potassium supplementation. 5.   Radiation oncology consult. 6.   RTC in 1 month for MD assessment, labs (CBC with diff, CMP, CEA).  I discussed the assessment and treatment plan with the patient.  The patient was provided an opportunity to ask questions and all were answered.  The patient agreed with the plan and demonstrated an understanding  of the instructions.  The patient was advised to call back if the symptoms worsen or if the condition fails to improve as anticipated.    Lequita Asal, MD, PhD    04/13/2020, 10:26 AM  I, Mirian Mo Tufford, am acting as a Education administrator for Lequita Asal, MD  I, [place] Mike Gip, MD, have reviewed the above documentation for accuracy and completeness, and I agree with the above.

## 2020-04-12 NOTE — Progress Notes (Signed)
Patient states she has no concerns or questions she's not sleeping well but appetite and nausea have improved. She recently had hernia surgery

## 2020-04-13 ENCOUNTER — Inpatient Hospital Stay (HOSPITAL_BASED_OUTPATIENT_CLINIC_OR_DEPARTMENT_OTHER): Payer: Medicare HMO | Admitting: Hematology and Oncology

## 2020-04-13 ENCOUNTER — Inpatient Hospital Stay: Payer: Medicare HMO | Attending: Hematology and Oncology

## 2020-04-13 ENCOUNTER — Other Ambulatory Visit: Payer: Self-pay

## 2020-04-13 ENCOUNTER — Encounter: Payer: Self-pay | Admitting: Hematology and Oncology

## 2020-04-13 ENCOUNTER — Ambulatory Visit: Payer: Medicare HMO

## 2020-04-13 VITALS — BP 117/60 | HR 85 | Temp 97.5°F | Resp 18 | Ht 67.0 in | Wt 187.8 lb

## 2020-04-13 DIAGNOSIS — E876 Hypokalemia: Secondary | ICD-10-CM | POA: Diagnosis not present

## 2020-04-13 DIAGNOSIS — C787 Secondary malignant neoplasm of liver and intrahepatic bile duct: Secondary | ICD-10-CM

## 2020-04-13 DIAGNOSIS — E538 Deficiency of other specified B group vitamins: Secondary | ICD-10-CM

## 2020-04-13 DIAGNOSIS — C7802 Secondary malignant neoplasm of left lung: Secondary | ICD-10-CM | POA: Diagnosis not present

## 2020-04-13 DIAGNOSIS — C20 Malignant neoplasm of rectum: Secondary | ICD-10-CM

## 2020-04-13 DIAGNOSIS — Z7189 Other specified counseling: Secondary | ICD-10-CM

## 2020-04-13 LAB — COMPREHENSIVE METABOLIC PANEL
ALT: 12 U/L (ref 0–44)
AST: 15 U/L (ref 15–41)
Albumin: 3.5 g/dL (ref 3.5–5.0)
Alkaline Phosphatase: 107 U/L (ref 38–126)
Anion gap: 9 (ref 5–15)
BUN: 7 mg/dL — ABNORMAL LOW (ref 8–23)
CO2: 28 mmol/L (ref 22–32)
Calcium: 8.7 mg/dL — ABNORMAL LOW (ref 8.9–10.3)
Chloride: 98 mmol/L (ref 98–111)
Creatinine, Ser: 0.83 mg/dL (ref 0.44–1.00)
GFR calc Af Amer: >60 mL/min (ref >60)
GFR calc non Af Amer: >60 mL/min (ref >60)
Glucose, Bld: 116 mg/dL — ABNORMAL HIGH (ref 70–99)
Potassium: 3.2 mmol/L — ABNORMAL LOW (ref 3.5–5.1)
Sodium: 135 mmol/L (ref 135–145)
Total Bilirubin: 0.3 mg/dL (ref 0.3–1.2)
Total Protein: 6.6 g/dL (ref 6.5–8.1)

## 2020-04-13 LAB — CBC WITH DIFFERENTIAL/PLATELET
Abs Immature Granulocytes: 0.04 10*3/uL (ref 0.00–0.07)
Basophils Absolute: 0 10*3/uL (ref 0.0–0.1)
Basophils Relative: 0 %
Eosinophils Absolute: 0.2 10*3/uL (ref 0.0–0.5)
Eosinophils Relative: 3 %
HCT: 35 % — ABNORMAL LOW (ref 36.0–46.0)
Hemoglobin: 12.1 g/dL (ref 12.0–15.0)
Immature Granulocytes: 1 %
Lymphocytes Relative: 21 %
Lymphs Abs: 1.3 10*3/uL (ref 0.7–4.0)
MCH: 31.4 pg (ref 26.0–34.0)
MCHC: 34.6 g/dL (ref 30.0–36.0)
MCV: 90.9 fL (ref 80.0–100.0)
Monocytes Absolute: 0.5 10*3/uL (ref 0.1–1.0)
Monocytes Relative: 7 %
Neutro Abs: 4.4 10*3/uL (ref 1.7–7.7)
Neutrophils Relative %: 68 %
Platelets: 322 10*3/uL (ref 150–400)
RBC: 3.85 MIL/uL — ABNORMAL LOW (ref 3.87–5.11)
RDW: 13.5 % (ref 11.5–15.5)
WBC: 6.4 10*3/uL (ref 4.0–10.5)
nRBC: 0 % (ref 0.0–0.2)

## 2020-04-13 LAB — MAGNESIUM: Magnesium: 1.6 mg/dL — ABNORMAL LOW (ref 1.7–2.4)

## 2020-04-13 MED ORDER — POTASSIUM CHLORIDE CRYS ER 10 MEQ PO TBCR: 10.0000 meq | EXTENDED_RELEASE_TABLET | Freq: Four times a day (QID) | ORAL | 1 refills | Status: DC

## 2020-04-13 NOTE — Progress Notes (Signed)
Called patient she states she has been taking 3 tabs of 10mg  ,so 30mg  PO a day for about a month now, however when she was in the hospital she missed a total of 8 doses per patient.

## 2020-04-14 LAB — CEA: CEA: 49.8 ng/mL — ABNORMAL HIGH (ref 0.0–4.7)

## 2020-04-18 ENCOUNTER — Encounter: Payer: Self-pay | Admitting: Radiation Oncology

## 2020-04-18 ENCOUNTER — Other Ambulatory Visit: Payer: Self-pay | Admitting: Hematology and Oncology

## 2020-04-18 ENCOUNTER — Other Ambulatory Visit: Payer: Self-pay

## 2020-04-18 ENCOUNTER — Ambulatory Visit
Admission: RE | Admit: 2020-04-18 | Discharge: 2020-04-18 | Disposition: A | Discharge status: Home / Self Care | Payer: Medicare HMO | Source: Ambulatory Visit | Attending: Radiation Oncology | Admitting: Radiation Oncology

## 2020-04-18 VITALS — BP 140/76 | HR 90 | Temp 96.0°F | Wt 183.6 lb

## 2020-04-18 DIAGNOSIS — C7800 Secondary malignant neoplasm of unspecified lung: Secondary | ICD-10-CM

## 2020-04-18 NOTE — Consult Note (Signed)
NEW PATIENT EVALUATION  Name: Tamara Hall  MRN: [id number]  Date:   04/18/2020     DOB: 10-31-1955   This 64 y.o. female patient presents to the clinic for initial evaluation of lung metastasis of the left lung and patient with known stage IV colorectal cancer.  REFERRING PHYSICIAN: Loni Muse, MD  CHIEF COMPLAINT:  Chief Complaint  Patient presents with  . Consult    DIAGNOSIS: There were no encounter diagnoses.   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  She underwent concurrent chemoradiation therapy prior to surgical resection.  She also had radiofrequency ablation of her liver metastasis.  She had previously had a VATS wedge resection of the left lung.  She is also undergone 1 cycle of FOLFIRI plus Avastin.  She is decided on no further chemotherapy.  She has had a left lung lesion which has been progressing in size considered a lung metastasis and was hypermetabolic on PET CT scan.  She is seen today for consideration of treating that lesion with SBRT.  She specifically denies cough hemoptysis or chest tightness.  Patient is status post APR with descending colostomy and VRAM flap in January 2019.  She recently had a ventral hernia at the site of her colostomy which has been repaired at [facility].  Her recent CT scans in May shows continued slight progression of the left lung lesion consistent with a metastatic focus no other lung pathology was noted.  PLANNED TREATMENT REGIMEN: SBRT to her left lung  PAST MEDICAL HISTORY:  has a past medical history of Cancer (Kay), Hypertension, and Pneumonia.    PAST SURGICAL HISTORY:  Past Surgical History:  Procedure Laterality Date  . ABDOMINAL HYSTERECTOMY    . BREAST SURGERY    . COLON  SURGERY  10/2017   [facility]  . COLONOSCOPY WITH PROPOFOL N/A 01/14/2017   Procedure: COLONOSCOPY WITH PROPOFOL;  Surgeon: Lucilla Lame, MD;  Location: [facility] ENDOSCOPY;  Service: Endoscopy;  Laterality: N/A;  . ELBOW SURGERY Right    Has had 4 surgeries on right elbow.  Marland Kitchen HAND SURGERY Right 2008  . JOINT REPLACEMENT    . LIVER BIOPSY  10/2017  . lung wedge resection  10/2017  . PORTA CATH INSERTION N/A 01/29/2017   Procedure: Glori Luis Cath Insertion;  Surgeon: Algernon Huxley, MD;  Location: [facility];  Service: Cardiovascular;  Laterality: N/A;  . TONSILLECTOMY      FAMILY HISTORY: family history includes Brain cancer in her maternal grandmother; Esophageal cancer (age of onset: 40) in her father; Lung cancer (age of onset: 70) in her mother.  SOCIAL HISTORY:  reports that she quit smoking about 2 years ago. Her smoking use included cigarettes. She has a 80.00 pack-year smoking history. She has never used smokeless tobacco. She reports that she does not drink alcohol and does not use drugs.  ALLERGIES: Patient has no known allergies.  MEDICATIONS:  Current Outpatient Medications  Medication Sig Dispense Refill  . albuterol (VENTOLIN HFA) 108 (90 Base) MCG/ACT inhaler Inhale into the lungs.    Marland Kitchen amLODipine (NORVASC) 5 MG tablet Take 5 mg by mouth daily.    . baclofen (LIORESAL) 10 MG tablet Take 10 mg by mouth 3 (three) times daily.    . folic acid (FOLVITE) 1  MG tablet TAKE 1 TABLET BY MOUTH EVERY DAY 90 tablet 1  . gabapentin (NEURONTIN) 300 MG capsule Take 1,200 mg by mouth 3 (three) times daily.     . hydrochlorothiazide (HYDRODIURIL) 25 MG tablet Take 25 mg by mouth daily.    Marland Kitchen HYDROcodone-acetaminophen (NORCO/VICODIN) 5-325 MG tablet Take 1 tablet by mouth every 6 (six) hours as needed for moderate pain. 15 tablet 0  . loperamide (IMODIUM A-D) 2 MG tablet Take 2 at diarrhea onset , then 1 every 2hr until 12hrs with no BM. May take 2 every 4hrs at night. If diarrhea recurs repeat. 100  tablet 1  . LORazepam (ATIVAN) 0.5 MG tablet Take 1 tablet (0.5 mg total) by mouth every 6 (six) hours as needed (NAUSEA). 30 tablet 0  . nortriptyline (PAMELOR) 10 MG capsule Take 10 mg by mouth at bedtime.    . ondansetron (ZOFRAN ODT) 8 MG disintegrating tablet Take 1 tablet (8 mg total) by mouth every 8 (eight) hours as needed for nausea or vomiting. 20 tablet 6  . Ostomy Supplies (PREMIER DRAINABLE POUCH 64MM) Pouch MISC     . potassium chloride (KLOR-CON M10) 10 MEQ tablet Take 1 tablet (10 mEq total) by mouth in the morning, at noon, in the evening, and at bedtime. 120 tablet 1  . dexamethasone (DECADRON) 4 MG tablet Take 2 tablets (8 mg total) by mouth daily. Start the day after chemo for 2 days. (Patient not taking: Reported on 04/18/2020) 8 tablet 5  . ondansetron (ZOFRAN) 8 MG tablet Take by mouth. (Patient not taking: Reported on 04/18/2020)     No current facility-administered medications for this encounter.    ECOG PERFORMANCE STATUS:  0 - Asymptomatic  REVIEW OF SYSTEMS: Patient denies any weight loss, fatigue, weakness, fever, chills or night sweats. Patient denies any loss of vision, blurred vision. Patient denies any ringing  of the ears or hearing loss. No irregular heartbeat. Patient denies heart murmur or history of fainting. Patient denies any chest pain or pain radiating to her upper extremities. Patient denies any shortness of breath, difficulty breathing at night, cough or hemoptysis. Patient denies any swelling in the lower legs. Patient denies any nausea vomiting, vomiting of blood, or coffee ground material in the vomitus. Patient denies any stomach pain. Patient states has had normal bowel movements no significant constipation or diarrhea. Patient denies any dysuria, hematuria or significant nocturia. Patient denies any problems walking, swelling in the joints or loss of balance. Patient denies any skin changes, loss of hair or loss of weight. Patient denies any excessive  worrying or anxiety or significant depression. Patient denies any problems with insomnia. Patient denies excessive thirst, polyuria, polydipsia. Patient denies any swollen glands, patient denies easy bruising or easy bleeding. Patient denies any recent infections, allergies or URI. Patient "s visual fields have not changed significantly in recent time.   PHYSICAL EXAM: BP 140/76 (BP Location: Left Arm, Patient Position: Sitting, Cuff Size: Normal)   Pulse 90   Temp (!) 96 F (35.6 C) (Tympanic)   Wt 183 lb 9 oz (83.3 kg)   SpO2 98%   BMI 28.75 kg/m  Well-developed well-nourished patient in NAD. HEENT reveals PERLA, EOMI, discs not visualized.  Oral cavity is clear. No oral mucosal lesions are identified. Neck is clear without evidence of cervical or supraclavicular adenopathy. Lungs are clear to A&P. Cardiac examination is essentially unremarkable with regular rate and rhythm without murmur rub or thrill. Abdomen is benign with no organomegaly or masses  noted. Motor sensory and DTR levels are equal and symmetric in the upper and lower extremities. Cranial nerves II through XII are grossly intact. Proprioception is intact. No peripheral adenopathy or edema is identified. No motor or sensory levels are noted. Crude visual fields are within normal range.  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  I would plan on delivering 6000 cGy in 5 fractions.  Risks and benefits of SBRT including possible development of a cough possible skin reaction fatigue alteration of blood counts all were explained in detail to the patient.  She seems to comprehend my treatment plan well we will use for dimensional treatment planning as well as motion suppression for  treatment planning I will also use PET/CT for treatment planning.  Patient comprehends my recommendations well.  CT simulation was scheduled.  I would like to take this opportunity to thank you for allowing me to participate in the care of your patient.Noreene Filbert, MD

## 2020-04-27 ENCOUNTER — Ambulatory Visit
Admission: RE | Admit: 2020-04-27 | Discharge: 2020-04-27 | Disposition: A | Discharge status: Home / Self Care | Payer: Medicare HMO | Source: Ambulatory Visit | Attending: Radiation Oncology | Admitting: Radiation Oncology

## 2020-04-27 DIAGNOSIS — C7802 Secondary malignant neoplasm of left lung: Secondary | ICD-10-CM | POA: Insufficient documentation

## 2020-04-27 DIAGNOSIS — C787 Secondary malignant neoplasm of liver and intrahepatic bile duct: Secondary | ICD-10-CM | POA: Diagnosis not present

## 2020-04-27 DIAGNOSIS — C20 Malignant neoplasm of rectum: Secondary | ICD-10-CM | POA: Insufficient documentation

## 2020-04-28 ENCOUNTER — Inpatient Hospital Stay: Payer: Medicare HMO

## 2020-05-04 DIAGNOSIS — C7802 Secondary malignant neoplasm of left lung: Secondary | ICD-10-CM | POA: Diagnosis not present

## 2020-05-06 ENCOUNTER — Other Ambulatory Visit: Payer: Self-pay | Admitting: Hematology and Oncology

## 2020-05-06 DIAGNOSIS — E876 Hypokalemia: Secondary | ICD-10-CM

## 2020-05-08 ENCOUNTER — Ambulatory Visit: Payer: Medicare HMO

## 2020-05-08 ENCOUNTER — Telehealth: Payer: Self-pay

## 2020-05-08 NOTE — Telephone Encounter (Signed)
Spoke with the patient to see what she will need for a dental clearance. The patient explained to me that she will need a note on letter head stating she is cleared to get extraction or fillings. i have typed up a letter and will have Dr Mike Gip to sign and fax it to Dr dodd office. Letter has bee signed and faxed to Dr Nicola Girt office.

## 2020-05-09 ENCOUNTER — Telehealth: Payer: Self-pay

## 2020-05-09 ENCOUNTER — Inpatient Hospital Stay: Payer: Medicare HMO | Attending: Hematology and Oncology

## 2020-05-09 ENCOUNTER — Other Ambulatory Visit: Payer: Self-pay

## 2020-05-09 ENCOUNTER — Ambulatory Visit: Payer: Medicare HMO

## 2020-05-09 ENCOUNTER — Ambulatory Visit
Admission: RE | Admit: 2020-05-09 | Discharge: 2020-05-09 | Disposition: A | Discharge status: Home / Self Care | Payer: Medicare HMO | Source: Ambulatory Visit | Attending: Radiation Oncology | Admitting: Radiation Oncology

## 2020-05-09 DIAGNOSIS — C20 Malignant neoplasm of rectum: Secondary | ICD-10-CM | POA: Insufficient documentation

## 2020-05-09 DIAGNOSIS — E876 Hypokalemia: Secondary | ICD-10-CM | POA: Diagnosis not present

## 2020-05-09 DIAGNOSIS — C7802 Secondary malignant neoplasm of left lung: Secondary | ICD-10-CM | POA: Diagnosis not present

## 2020-05-09 DIAGNOSIS — C787 Secondary malignant neoplasm of liver and intrahepatic bile duct: Secondary | ICD-10-CM | POA: Diagnosis not present

## 2020-05-09 DIAGNOSIS — E538 Deficiency of other specified B group vitamins: Secondary | ICD-10-CM | POA: Diagnosis not present

## 2020-05-09 DIAGNOSIS — K219 Gastro-esophageal reflux disease without esophagitis: Secondary | ICD-10-CM | POA: Insufficient documentation

## 2020-05-09 DIAGNOSIS — Z87891 Personal history of nicotine dependence: Secondary | ICD-10-CM | POA: Insufficient documentation

## 2020-05-09 LAB — CBC WITH DIFFERENTIAL/PLATELET
Abs Immature Granulocytes: 0.03 10*3/uL (ref 0.00–0.07)
Basophils Absolute: 0 10*3/uL (ref 0.0–0.1)
Basophils Relative: 0 %
Eosinophils Absolute: 0.1 10*3/uL (ref 0.0–0.5)
Eosinophils Relative: 2 %
HCT: 39.7 % (ref 36.0–46.0)
Hemoglobin: 13.4 g/dL (ref 12.0–15.0)
Immature Granulocytes: 1 %
Lymphocytes Relative: 24 %
Lymphs Abs: 1.5 10*3/uL (ref 0.7–4.0)
MCH: 30.9 pg (ref 26.0–34.0)
MCHC: 33.8 g/dL (ref 30.0–36.0)
MCV: 91.5 fL (ref 80.0–100.0)
Monocytes Absolute: 0.5 10*3/uL (ref 0.1–1.0)
Monocytes Relative: 8 %
Neutro Abs: 4 10*3/uL (ref 1.7–7.7)
Neutrophils Relative %: 65 %
Platelets: 268 10*3/uL (ref 150–400)
RBC: 4.34 MIL/uL (ref 3.87–5.11)
RDW: 13 % (ref 11.5–15.5)
WBC: 6.1 10*3/uL (ref 4.0–10.5)
nRBC: 0 % (ref 0.0–0.2)

## 2020-05-09 LAB — COMPREHENSIVE METABOLIC PANEL
ALT: 13 U/L (ref 0–44)
AST: 16 U/L (ref 15–41)
Albumin: 4.1 g/dL (ref 3.5–5.0)
Alkaline Phosphatase: 100 U/L (ref 38–126)
Anion gap: 12 (ref 5–15)
BUN: 9 mg/dL (ref 8–23)
CO2: 29 mmol/L (ref 22–32)
Calcium: 8.8 mg/dL — ABNORMAL LOW (ref 8.9–10.3)
Chloride: 99 mmol/L (ref 98–111)
Creatinine, Ser: 0.73 mg/dL (ref 0.44–1.00)
GFR calc Af Amer: >60 mL/min (ref >60)
GFR calc non Af Amer: >60 mL/min (ref >60)
Glucose, Bld: 118 mg/dL — ABNORMAL HIGH (ref 70–99)
Potassium: 3.3 mmol/L — ABNORMAL LOW (ref 3.5–5.1)
Sodium: 140 mmol/L (ref 135–145)
Total Bilirubin: 0.3 mg/dL (ref 0.3–1.2)
Total Protein: 7 g/dL (ref 6.5–8.1)

## 2020-05-09 LAB — URINALYSIS, DIPSTICK ONLY
Bilirubin Urine: NEGATIVE
Glucose, UA: NEGATIVE mg/dL
Hgb urine dipstick: NEGATIVE
Ketones, ur: NEGATIVE mg/dL
Nitrite: NEGATIVE
Protein, ur: NEGATIVE mg/dL
Specific Gravity, Urine: 1.018 (ref 1.005–1.030)
pH: 5 (ref 5.0–8.0)

## 2020-05-09 NOTE — Telephone Encounter (Signed)
-----   Message from Lequita Asal, MD sent at 05/09/2020  3:20 PM EDT ----- Regarding: RE: Please call patient  Is she having diarrhea?  Does she see whole tablets in her stool?  Missed doses?  She should increase her potassium to five a day of the 10 meq a day.    When do we see her again?  M  ----- Message ----- From: Drue Dun, RN Sent: 05/09/2020  12:45 PM EDT To: Lequita Asal, MD, Vito Berger, CMA Subject: RE: Please call patient                        She states that she is taking 4 a day of the klor con 10 meq ----- Message ----- From: Lequita Asal, MD Sent: 05/09/2020  12:29 PM EDT To: Vito Berger, CMA, Hennessy Bartel Eusebio Me, RN Subject: Please call patient                             Potassium is low (3.3.).  How much potassium is she taking?  M ----- Message ----- From: Buel Ream, Lab In [place] Sent: 05/09/2020  11:27 AM EDT To: Lequita Asal, MD

## 2020-05-09 NOTE — Telephone Encounter (Signed)
-----   Message from Drue Dun, RN sent at 05/09/2020 12:45 PM EDT ----- Regarding: RE: Please call patient She states that she is taking 4 a day of the klor con 10 meq ----- Message ----- From: Lequita Asal, MD Sent: 05/09/2020  12:29 PM EDT To: Vito Berger, CMA, Roxie Eusebio Me, RN Subject: Please call patient                             Potassium is low (3.3.).  How much potassium is she taking?  M ----- Message ----- From: Buel Ream, Lab In [place] Sent: 05/09/2020  11:27 AM EDT To: Lequita Asal, MD

## 2020-05-09 NOTE — Telephone Encounter (Signed)
Spoke with the patient to see how much potassium she was taking she states 10 meq 4 times a day. Dr Mike Gip has been made aware.

## 2020-05-09 NOTE — Telephone Encounter (Signed)
No diarrhea. No tablets in stool. No missed doses.   She is aware to start 5 a day. She comes back on 05/29/20.

## 2020-05-10 ENCOUNTER — Ambulatory Visit: Payer: Medicare HMO

## 2020-05-10 ENCOUNTER — Ambulatory Visit
Admission: RE | Admit: 2020-05-10 | Discharge: 2020-05-10 | Disposition: A | Discharge status: Home / Self Care | Payer: Medicare HMO | Source: Ambulatory Visit | Attending: Radiation Oncology | Admitting: Radiation Oncology

## 2020-05-10 DIAGNOSIS — C7802 Secondary malignant neoplasm of left lung: Secondary | ICD-10-CM | POA: Diagnosis not present

## 2020-05-11 ENCOUNTER — Ambulatory Visit
Admission: RE | Admit: 2020-05-11 | Discharge: 2020-05-11 | Disposition: A | Discharge status: Home / Self Care | Payer: Medicare HMO | Source: Ambulatory Visit | Attending: Radiation Oncology | Admitting: Radiation Oncology

## 2020-05-11 ENCOUNTER — Ambulatory Visit: Payer: Medicare HMO

## 2020-05-11 DIAGNOSIS — C7802 Secondary malignant neoplasm of left lung: Secondary | ICD-10-CM | POA: Diagnosis not present

## 2020-05-12 ENCOUNTER — Ambulatory Visit: Payer: Medicare HMO

## 2020-05-12 ENCOUNTER — Ambulatory Visit
Admission: RE | Admit: 2020-05-12 | Discharge: 2020-05-12 | Disposition: A | Discharge status: Home / Self Care | Payer: Medicare HMO | Source: Ambulatory Visit | Attending: Radiation Oncology | Admitting: Radiation Oncology

## 2020-05-12 DIAGNOSIS — C7802 Secondary malignant neoplasm of left lung: Secondary | ICD-10-CM | POA: Diagnosis not present

## 2020-05-15 ENCOUNTER — Other Ambulatory Visit: Payer: Medicare HMO

## 2020-05-15 ENCOUNTER — Ambulatory Visit: Payer: Medicare HMO | Admitting: Hematology and Oncology

## 2020-05-15 ENCOUNTER — Ambulatory Visit
Admission: RE | Admit: 2020-05-15 | Discharge: 2020-05-15 | Disposition: A | Discharge status: Home / Self Care | Payer: Medicare HMO | Source: Ambulatory Visit | Attending: Radiation Oncology | Admitting: Radiation Oncology

## 2020-05-15 DIAGNOSIS — C7802 Secondary malignant neoplasm of left lung: Secondary | ICD-10-CM | POA: Diagnosis not present

## 2020-05-16 ENCOUNTER — Ambulatory Visit: Payer: Medicare HMO

## 2020-05-16 ENCOUNTER — Ambulatory Visit
Admission: RE | Admit: 2020-05-16 | Discharge: 2020-05-16 | Disposition: A | Discharge status: Home / Self Care | Payer: Medicare HMO | Source: Ambulatory Visit | Attending: Radiation Oncology | Admitting: Radiation Oncology

## 2020-05-16 DIAGNOSIS — C7802 Secondary malignant neoplasm of left lung: Secondary | ICD-10-CM | POA: Diagnosis not present

## 2020-05-17 ENCOUNTER — Ambulatory Visit
Admission: RE | Admit: 2020-05-17 | Discharge: 2020-05-17 | Disposition: A | Discharge status: Home / Self Care | Payer: Medicare HMO | Source: Ambulatory Visit | Attending: Radiation Oncology | Admitting: Radiation Oncology

## 2020-05-17 DIAGNOSIS — C7802 Secondary malignant neoplasm of left lung: Secondary | ICD-10-CM | POA: Diagnosis not present

## 2020-05-18 ENCOUNTER — Ambulatory Visit
Admission: RE | Admit: 2020-05-18 | Discharge: 2020-05-18 | Disposition: A | Discharge status: Home / Self Care | Payer: Medicare HMO | Source: Ambulatory Visit | Attending: Radiation Oncology | Admitting: Radiation Oncology

## 2020-05-18 ENCOUNTER — Ambulatory Visit: Payer: Medicare HMO

## 2020-05-18 DIAGNOSIS — C7802 Secondary malignant neoplasm of left lung: Secondary | ICD-10-CM | POA: Diagnosis not present

## 2020-05-19 ENCOUNTER — Ambulatory Visit: Payer: Medicare HMO

## 2020-05-19 ENCOUNTER — Ambulatory Visit
Admission: RE | Admit: 2020-05-19 | Discharge: 2020-05-19 | Disposition: A | Discharge status: Home / Self Care | Payer: Medicare HMO | Source: Ambulatory Visit | Attending: Radiation Oncology | Admitting: Radiation Oncology

## 2020-05-19 DIAGNOSIS — C7802 Secondary malignant neoplasm of left lung: Secondary | ICD-10-CM | POA: Diagnosis not present

## 2020-05-22 ENCOUNTER — Ambulatory Visit
Admission: RE | Admit: 2020-05-22 | Discharge: 2020-05-22 | Disposition: A | Discharge status: Home / Self Care | Payer: Medicare HMO | Source: Ambulatory Visit | Attending: Radiation Oncology | Admitting: Radiation Oncology

## 2020-05-22 ENCOUNTER — Ambulatory Visit: Payer: Medicare HMO

## 2020-05-22 DIAGNOSIS — C7802 Secondary malignant neoplasm of left lung: Secondary | ICD-10-CM | POA: Diagnosis not present

## 2020-05-23 ENCOUNTER — Ambulatory Visit: Payer: Medicare HMO

## 2020-05-24 ENCOUNTER — Other Ambulatory Visit: Payer: Self-pay

## 2020-05-24 DIAGNOSIS — C787 Secondary malignant neoplasm of liver and intrahepatic bile duct: Secondary | ICD-10-CM

## 2020-05-25 ENCOUNTER — Telehealth: Payer: Self-pay

## 2020-05-25 NOTE — Telephone Encounter (Signed)
Nutrition Assessment:  Patient identified on Malnutrition screening report for weight loss and poor appetite.   64 year old female with stage IV colorectal cancer with mets to liver and lung.  S/p colostomy, VATS wedge of left lung, recent hernia repair in June causing small bowel obstruction at [facility].  Patient has declined further chemotherapy or surgery.  Has just completed radiation therapy.   Spoke with patient via phone to introduce self and service at [facility].  Patient reports that appetite is good except for when she has colon/bowel spasms. She says this could last 3-4 days and she eats less during this time frame. Reports that typically she eats cereal for breakfast, sometimes skips lunch.  If does not skip lunch will have grilled cheese or tomato sandwich with fruit.  Supper is sandwich or meat and side items.  Does not like oral nutrition supplements (boost/ensure). Drinks chocolate milk.    Patient reports over the last few days after completing radiation pain in mid chest area when she eats foods.  Reports that when food passes this spot in her esophagus it causes pain. Aware of side effect of radiation esophagitis.    Medications: reviewed  Labs: reviewed  Anthropometrics:   Height: 67 inches Weight: 183 lb 7/13 191 lb 6/9 BMI: 28  4% weight loss in the last 2 months, concerning but not significant.     NUTRITION DIAGNOSIS: Unintentional weight loss related to recent surgery/hospitalization as evidenced by 4% weight loss in the last 2 months   INTERVENTION:  Discussed soft, smooth, liquid foods to help with pain in esophagus.  Encouraged patient to try carnation breakfast essentials packet mixed with milk.  Discussed ways mix ensure/boost to help flavor/taste.   Encouraged patient to discuss symptoms with Dr Mike Gip and Baruch Gouty.  Patient verbalized understanding Contact information provided      NEXT VISIT: no follow-up planned.  Patient was encouraged to call RD for  future nutrition concerns or questions  Muath Hallam B. Zenia Resides, [place], [place] Registered Dietitian [phone] (mobile)

## 2020-05-26 ENCOUNTER — Inpatient Hospital Stay: Payer: Medicare HMO

## 2020-05-28 NOTE — Progress Notes (Addendum)
[facility]  [address] [place], [place] [address] Phone: [phone]  Fax: [phone]   Clinic Day:  05/29/2020  Referring physician: Loni Muse, MD  Chief Complaint: Tamara Hall is a 64 y.o. 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  HPI: The patient was last seen in the medical oncology clinic on 04/13/2020. At that time, she had recently been discharged from [facility] with an incarcerated hernia and had undergone parastomal hernia repair on 03/22/2020.  She had intermittent abdominal pain.  She denied any respiratory symptoms.  She decided against further chemotherapy or surgery.  She was interested in consideration of radiation to the left infrahilar nodule.  She was seen in consultation by Dr. Donella Stade on 04/18/2020.  Plan was for SBRT to the left lung lesion consisting of 6000 cGy in 5 fractions.  She received IMRT from 05/10/2020 - 05/22/2020.   During the interim, she has done well.  She states that radiation went "ok".  Her only side effect was some dysphagia where she felt like something was "blocking".  She did not want to eat.  She has been using omeprazole from her husband's medications.  She notes severe indigestion.  She denies any abdominal pain, nausea, vomiting or diarrhea.  She is taking potassium 5 pills/day.  She denies any loose stools.  She is not on a diuretic.  She states that Dr. Gordy Levan has prescribed her hydrochlorothiazide.   Past Medical History:  Diagnosis Date  . Cancer ([place])    colon, with mets to liver and left lung  . Hypertension   . Pneumonia    20+ years ago    Past Surgical History:  Procedure Laterality Date  . ABDOMINAL HYSTERECTOMY    . BREAST SURGERY    . COLON SURGERY  10/2017   [facility]  . COLONOSCOPY WITH PROPOFOL N/A 01/14/2017   Procedure: COLONOSCOPY WITH PROPOFOL;  Surgeon:  Lucilla Lame, MD;  Location: [facility] ENDOSCOPY;  Service: Endoscopy;  Laterality: N/A;  . ELBOW SURGERY Right    Has had 4 surgeries on right elbow.  Marland Kitchen HAND SURGERY Right 2008  . JOINT REPLACEMENT    . LIVER BIOPSY  10/2017  . lung wedge resection  10/2017  . PORTA CATH INSERTION N/A 01/29/2017   Procedure: Glori Luis Cath Insertion;  Surgeon: Algernon Huxley, MD;  Location: [facility];  Service: Cardiovascular;  Laterality: N/A;  . TONSILLECTOMY      Family History  Problem Relation Age of Onset  . Lung cancer Mother 85  . Esophageal cancer Father 25  . Brain cancer Maternal Grandmother     Social History:  reports that she quit smoking about 2 years ago. Her smoking use included cigarettes. She has a 80.00 pack-year smoking history. She has never used smokeless tobacco. She reports that she does not drink alcohol and does not use drugs. She was smoking 1/2 packs/day.She lives in [place]. She has 3 children (ages 45, 95, and 20). She smoked 2 packs a day. She started smoking at age 1. She initially stopped smoking on 10/24/2017. 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 [name] is her significant other. The patient is alone today.    Allergies: No Known Allergies  Current Medications: Current Outpatient Medications  Medication Sig Dispense Refill  . albuterol (VENTOLIN HFA) 108 (90  Base) MCG/ACT inhaler Inhale into the lungs.    Marland Kitchen amLODipine (NORVASC) 5 MG tablet Take 5 mg by mouth daily.    . baclofen (LIORESAL) 10 MG tablet Take 10 mg by mouth 3 (three) times daily.    . folic acid (FOLVITE) 1 MG tablet TAKE 1 TABLET BY MOUTH EVERY DAY 90 tablet 1  . gabapentin (NEURONTIN) 300 MG capsule Take 1,200 mg by mouth 3 (three) times daily.     . hydrochlorothiazide (HYDRODIURIL) 25 MG tablet Take 25 mg by mouth daily.    Marland Kitchen HYDROcodone-acetaminophen (NORCO/VICODIN) 5-325 MG tablet Take 1 tablet by mouth every 6 (six) hours as needed for  moderate pain. 15 tablet 0  . KLOR-CON M10 10 MEQ tablet TAKE 1 TABLET (10 MEQ TOTAL) BY MOUTH IN THE MORNING, AT NOON, IN THE EVENING, AND AT BEDTIME. (Patient taking differently: Take 10 mEq by mouth. 5 times a day) 120 tablet 1  . LORazepam (ATIVAN) 0.5 MG tablet Take 1 tablet (0.5 mg total) by mouth every 6 (six) hours as needed (NAUSEA). 30 tablet 0  . nortriptyline (PAMELOR) 10 MG capsule Take 10 mg by mouth at bedtime.    . Ostomy Supplies (PREMIER DRAINABLE POUCH [phone]) Pouch MISC     . dexamethasone (DECADRON) 4 MG tablet Take 2 tablets (8 mg total) by mouth daily. Start the day after chemo for 2 days. (Patient not taking: Reported on 04/18/2020) 8 tablet 5  . loperamide (IMODIUM A-D) 2 MG tablet Take 2 at diarrhea onset , then 1 every 2hr until 12hrs with no BM. May take 2 every 4hrs at night. If diarrhea recurs repeat. (Patient not taking: Reported on 05/29/2020) 100 tablet 1  . ondansetron (ZOFRAN ODT) 8 MG disintegrating tablet Take 1 tablet (8 mg total) by mouth every 8 (eight) hours as needed for nausea or vomiting. (Patient not taking: Reported on 05/29/2020) 20 tablet 6  . ondansetron (ZOFRAN) 8 MG tablet Take by mouth. (Patient not taking: Reported on 04/18/2020)     No current facility-administered medications for this visit.    Review of Systems  Constitutional: Positive for weight loss (6 pounds). Negative for chills, diaphoresis, fever and malaise/fatigue.  HENT: Negative.  Negative for congestion, ear discharge, ear pain, hearing loss, nosebleeds, sinus pain, sore throat and tinnitus.        Swallowing issues s/p radiation, improving.  Eyes: Negative.  Negative for blurred vision and double vision.  Respiratory: Negative.  Negative for cough, sputum production and shortness of breath.   Cardiovascular: Negative.  Negative for chest pain, palpitations and leg swelling.  Gastrointestinal: Positive for heartburn (severe indigestion). Negative for abdominal pain, blood in stool,  constipation, diarrhea, melena, nausea and vomiting.       S/p parastomal hernia repair on 03/22/2020.  Genitourinary: Negative.  Negative for dysuria, frequency and urgency.  Musculoskeletal: Positive for back pain. Negative for falls, joint pain, myalgias and neck pain.  Skin: Negative.  Negative for itching and rash.  Neurological: Negative.  Negative for dizziness, sensory change, speech change, weakness and headaches.  Endo/Heme/Allergies: Negative.  Negative for environmental allergies. Does not bruise/bleed easily.  Psychiatric/Behavioral: Negative.  Negative for depression and memory loss. The patient is not nervous/anxious and does not have insomnia.   All other systems reviewed and are negative.  Performance status (ECOG): 1  Vitals Blood pressure 132/67, pulse 91, temperature (!) 97 F (36.1 C), temperature source Tympanic, weight 181 lb 15.8 oz (82.6 kg), SpO2 98 %.   Physical Exam  Vitals and nursing note reviewed.  Constitutional:      General: She is not in acute distress.    Appearance: Normal appearance. She is well-developed.     Interventions: Face mask in place.     Comments:    HENT:     Head: Normocephalic and atraumatic.     Mouth/Throat:     Mouth: Mucous membranes are moist. No oral lesions.     Pharynx: Oropharynx is clear.  Eyes:     General: No scleral icterus.    Conjunctiva/sclera: Conjunctivae normal.     Pupils: Pupils are equal, round, and reactive to light.     Comments: Blue eyes.  Neck:     Vascular: No JVD.  Cardiovascular:     Rate and Rhythm: Normal rate and regular rhythm.     Heart sounds: Normal heart sounds. No murmur heard.  No friction rub. No gallop.   Pulmonary:     Effort: Pulmonary effort is normal. No respiratory distress.     Breath sounds: Normal breath sounds. No wheezing, rhonchi or rales.  Chest:     Chest wall: No tenderness.  Abdominal:     General: Bowel sounds are normal. There is no distension.     Palpations:  Abdomen is soft. There is no mass.     Tenderness: There is no abdominal tenderness. There is no guarding or rebound.     Comments: Colostomy site unremarkable.  Musculoskeletal:        General: No swelling or tenderness. Normal range of motion.     Cervical back: Normal range of motion and neck supple.  Lymphadenopathy:     Head:     Right side of head: No preauricular, posterior auricular or occipital adenopathy.     Left side of head: No preauricular, posterior auricular or occipital adenopathy.     Cervical: No cervical adenopathy.     Upper Body:     Right upper body: No supraclavicular or axillary adenopathy.     Left upper body: No supraclavicular or axillary adenopathy.     Lower Body: No right inguinal adenopathy. No left inguinal adenopathy.  Skin:    General: Skin is warm.     Coloration: Skin is not pale.     Findings: No bruising, erythema, lesion or rash.     Comments: Midline vertical incision healed well.  Neurological:     Mental Status: She is alert and oriented to person, place, and time.  Psychiatric:        Behavior: Behavior normal.        Thought Content: Thought content normal.        Judgment: Judgment normal.    Imaging studies: 01/13/2017:Abdomen and pelvic CTrevealed irregular thickening of the rectum suspicious for carcinoma. There was a 1.6 cm hypoattenuating lesion in the right hepatic lobe worrisome for metastatic disease. 01/15/2017:Chest CT revealed a 9 mm noncalcified left lower lobe pulmonary nodule (new). 01/16/2017:Pelvic MRIrevealed rectal adenocarcinoma T3N1. There was extension beyond the muscularis propria (17 mm). There were mesorectal nodes >= 5 mm. There was adenopathy on the left side of the mesorectum, including a 9 mm lesion. The distance from the tumor to the anal sphincter was 8 mm. 01/27/2017:PET scanrevealed intense metabolic activity associated rectal mass consists with primary rectal carcinoma. There was a  hypermetabolic metastatic lesion to the central LEFT hepatic lobe. There was metabolic activity associated with small LEFT lower lobe nodule which was most consistent with pulmonary metastasis. 01/31/2017:Liver MRIrevealed a 2.6  x 2.7 x 2.1 cm lesion in segment VIII of the liver near the junction with segment IVa c/w a metastatic lesion. There was a 1.1 x 0.8 cm suspected metastatic lesion peripherally in segment VI of the liver.  05/16/2017:Abdomen and pelvic CTrevealed persistent but decreased asymmetric wall thickening in the rectum. The medial segment left liver lesion was smaller (2.7 x 2.6 cm to 1.9 x 1.6 cm). A tiny lesion seen in segment VI of the liver on previous MRI was not evident on today's CT scan. The left lower lobe pulmonary nodule was smaller (9 mm to 6 mm). There was no new or progressive findings in the abdomen and pelvis. 08/19/2017:Chest CTwith contrast on 08/19/2017 revealed left lower lobe 6 mm solid pulmonary nodule, decreased since 01/15/2017 chest CT. Stable 7 mm right lower lobe pulmonary nodule. There was no new or progressive metastatic disease in the chest.  09/03/2018: Pelvic MRI at [facility] demonstrated a partial response of the primary tumor and extramural disease. Post treatment category ymrT T3b. There was an overall decrease in size in the previously seen perirectal node. Hypointense changes at the site of prior tumor focally contacedt buts does not definitely invade the LEFT levator ani. Tumor contacted, but does not definitely invade the internal anal sphincter. No evidence of residual or recurrent hepatic disease following microwave ablation. 02/17/2018:Abdomen and pelvic CTrevealed evolution of the ablation zone in segment 4A, now measuring 3.7 x 3.1 cm, decreased.There was no findings to suggest viable tumor.She is s/pabdominoperineal resection with left lower quadrant colostomy. There was no evidence of new/progressive metastatic disease.She is  s/pleft lower lobe pulmonary wedge resection, incompletely visualized. 03/04/2018:Chest CTrevealed a stable chest CT (compared to11/13/2018). The index right lower lobe lung nodule measured7 mm (unchanged). There was no new or progressive nodularity identified. There was continued decrease in size of ablation defect within segment 4a of the liver. 08/13/2019:[facility] of parastomal hernia with small bowel as well as multiple small bowel loops whichwere dilated.There was hepatic steatosis and post-treatment changes in the pelvis with persistent presacral soft tissue. There was a 3.5 cm infrarenal abdominal aortic aneurysm. 09/09/2019:Chest CT at [facility] no intrathoracic metastasis and no new or enlarging pulmonary nodules. 09/09/2019:Abdomen and pelvis CT at [facility] multiple subcentimeter hypodensities in the dome of the liver, which appeared new/more conspicuous from prior CT. There were not seen on the prior MRI. Further evaluation with MRI was recommended. CT evaluationwas limited due to background fatty liver and the heterogeneity/these lesions could have been secondary to fatty liver. There was unchanged soft tissue stranding and nodularity in the pelvis, with a few prominent more nodular areas. There were a few more prominent indeterminateareasand short-term follow up was recommended to ensure stability and exclude peritoneal disease. Further evaluation with MRI could have been helpful. 11/12/2019: Liver and pelvis MRIrevealedno findings to explain the patient's history of rising CEA. There was irregular presacral soft tissue with apparent scarring noted in the left paracentral pelviswas similar to prior studies and presumably treatment related. PET-CT could be used, as clinically warranted to exclude hypermetabolic disease within thatregion. There was an ablation defect identified in segment IV with minimal irregular enhancement  along the anterosuperior margin, indeterminate. Attention on follow-up recommended.There was a tiny cyst identified in the dome of the right liver without other findings to suggest metastatic disease in the hepatic parenchyma. 11/29/2019:PET scanrevealed 1.6 x 1.5 cmnodularthickeningat the resection margin (SUV 11.3)in the LEFT lower lobe. There was no evidence of hepatic metastasis. There was no evidence  of local recurrence in pelvis. There was insufficiency fracture in the LEFT sacral ala presumably related to radiation treatment. 02/23/2020:  Chest, abdomen and pelvis CT revealed a stable 2.6 x 1.7 cm partially calcified left infrahilar pulmonary nodule, hypermetabolic on PET scan and likely metastasis.  There were new no new or enlarging pulmonary nodules.  There were stable treated hepatic metastatic disease with no evidence of progression or local recurrence.  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  There is no evidence of incarceration or obstruction.  There was mild wall thickening of multiple loops of small bowel in the mid abdomen, possibly related to prior radiation therapy.  There was stable chronic sacral insufficiency fractures bilaterally.  There was a stable 3.7 cm fusiform infrarenal abdominal aortic aneurysm.  03/22/2020:  Abdomen and pelvis CT at [facility] revealed a high-grade small bowel obstruction. There were 2 separate loops of small bowel contained within the parastomal hernia. The first was more proximal small bowel which appeared dilated but nonobstructed. The second segment of included small bowel was more distal small bowel which appeared dilated entering into the herniaand collapsed exiting the hernia, consistent with point of obstruction.  There was a 3.7 cm infrarenal abdominal aortic aneurysm with extensive mural thrombus, similar in size compared with previous examination, although degree of mural thrombus had increased.  Additional calcified atherosclerotic plaque was noted throughout the abdominal aorta and its branches.    Appointment on 05/29/2020  Component Date Value Ref Range Status  . Sodium 05/29/2020 136  135 - 145 mmol/L Final  . Potassium 05/29/2020 3.3* 3.5 - 5.1 mmol/L Final  . Chloride 05/29/2020 99  98 - 111 mmol/L Final  . CO2 05/29/2020 29  22 - 32 mmol/L Final  . Glucose, Bld 05/29/2020 100* 70 - 99 mg/dL Final   Glucose reference range applies only to samples taken after fasting for at least 8 hours.  . BUN 05/29/2020 10  8 - 23 mg/dL Final  . Creatinine, Ser 05/29/2020 0.70  0.44 - 1.00 mg/dL Final  . Calcium 05/29/2020 9.0  8.9 - 10.3 mg/dL Final  . Total Protein 05/29/2020 7.5  6.5 - 8.1 g/dL Final  . Albumin 05/29/2020 4.1  3.5 - 5.0 g/dL Final  . AST 05/29/2020 14* 15 - 41 U/L Final  . ALT 05/29/2020 10  0 - 44 U/L Final  . Alkaline Phosphatase 05/29/2020 89  38 - 126 U/L Final  . Total Bilirubin 05/29/2020 0.4  0.3 - 1.2 mg/dL Final  . GFR calc non Af Amer 05/29/2020 >60  >60 mL/min Final  . GFR calc Af Amer 05/29/2020 >60  >60 mL/min Final  . Anion gap 05/29/2020 8  5 - 15 Final   Performed at [facility], [address], [place], [place] [address]  . WBC 05/29/2020 4.9  4.0 - 10.5 K/uL Final  . RBC 05/29/2020 4.52  3.87 - 5.11 MIL/uL Final  . Hemoglobin 05/29/2020 14.2  12.0 - 15.0 g/dL Final  . HCT 05/29/2020 41.3  36 - 46 % Final  . MCV 05/29/2020 91.4  80.0 - 100.0 fL Final  . MCH 05/29/2020 31.4  26.0 - 34.0 pg Final  . MCHC 05/29/2020 34.4  30.0 - 36.0 g/dL Final  . RDW 05/29/2020 13.2  11.5 - 15.5 % Final  . Platelets 05/29/2020 236  150 - 400 K/uL Final  . nRBC 05/29/2020 0.0  0.0 - 0.2 % Final  . Neutrophils Relative %  05/29/2020 78  % Final  . Neutro Abs 05/29/2020 3.8  1.7 - 7.7 K/uL Final  . Lymphocytes Relative 05/29/2020 12  % Final  . Lymphs Abs 05/29/2020 0.6* 0.7 - 4.0 K/uL Final  . Monocytes Relative 05/29/2020 9  % Final  . Monocytes  Absolute 05/29/2020 0.4  0 - 1 K/uL Final  . Eosinophils Relative 05/29/2020 1  % Final  . Eosinophils Absolute 05/29/2020 0.1  0 - 0 K/uL Final  . Basophils Relative 05/29/2020 0  % Final  . Basophils Absolute 05/29/2020 0.0  0 - 0 K/uL Final  . Immature Granulocytes 05/29/2020 0  % Final  . Abs Immature Granulocytes 05/29/2020 0.02  0.00 - 0.07 K/uL Final   Performed at [facility], [address] [place], [place] [address]    Assessment:  Tamara Hall is a 64 y.o. female with clinical stage T3N1M1rectal cancer. She presented with a 9 month history of rectal bleeding. Colonoscopyon 01/14/2017 revealed afrond-like/villous non-obstructing large mass in the rectum. The mass was non-circumferential. Biopsies revealed high grade dysplasia within an adenoma (? sampling error). CEAwas 31.6 on 01/15/2017.  PET scanon 01/27/2017 revealed intense metabolic activity associated rectal mass consists with primary rectal carcinoma. There was a hypermetabolic metastatic lesion to the central LEFT hepatic lobe. There was metabolic activity associated with small LEFT lower lobe nodule which was most consistent with pulmonary metastasis.  CT guided liver biopsyat [facility] on 02/07/2017 revealed malignant cells c/w metastatic colorectal adenocarcinoma. [facility] on 12/29/2019 [phone]) revealed MS-stable, 1 Muts/Mb tumor mutational burden; KRAS G12C, PTEN rearrangement intron 8, APC G1106*, [id number], and NRAS wildtype.  There were no reportable mutations in BRAF and NRAS.     CEAhas been followed: 31.6 on 01/15/2017, 38.1 on 02/18/2017, 25.1 on 03/18/2017, 11.8 on 04/08/2017, 9.2 on 04/22/2017, 9.5 on 05/20/2017, 9.6 on 06/19/2017, 3.7 on 02/18/2018,5.8 on 06/26/2018, 10.5 on 11/20/2018, 19.5 on 03/26/2019, 34.0 on 07/21/2019, 46.4 on 09/09/2019, 54.4 on 10/28/2019, 108.0 on 02/10/2020, and 121.0 on 03/08/2020.  She received 2 cycles of FOLFOX (02/18/2017 - 03/04/2017).  She developed transient cold induced neuropathy. She developed wheezing and hypoxia with cycle #2 felt secondary to oxaliplatin. She received 4 cycles ofFOLFIRI(03/24/2017 - 04/22/2017) with Neulasta support. Diarrhea was controlled with Imodium.  She underwent CT guided microwave ablation of the liver lesionon 06/06/2017.   She began radiationon 06/18/2017. She receiveddaily XMelody Haverradiation. Radiation and Xeloda completed on 08/15/2017.  She underwentAPR with descending colostomy and VRAM flapon 10/24/2017. She underwent a VATS wedge resectionto her LEFT lung. Pathology from the lung demonstrateda 1 cmmetastatic intestinal type adenocarcinoma c/w metastasis from patient's known colorectal carcinoma.Pathology from the rectal resection revealed a 2.1 cm invasive moderately differentiated adenocarcinoma arising in a tubular adenoma with transmural invasion focally into the perirectal soft tissue. There was no lymphovascular or perineural invasion. Margins were negative. Zero of 20 lymph nodes were positive. Final diagnosiswas [id number] adenocarcinoma with metastasis to left lower lobe of the lung.  Foundation One on 12/29/2019 revealed MS-stable, 1 Muts/Mb tumor mutational burden; KRAS G12C, PTEN rearrangement intron 8, APC G1106*, [id number], and NRAS wildtype.  There were no reportable mutations in BRAF and NRAS.  She has had post-operative pain and ostomy ischemia. She underwent surgical ostomy revisionon 11/28/2017. Pathologyshowed focal mucosal erosion and fat necrosis c/w ischemia. There was extensive underlying fat necrosis. No dysplasia or carcinoma was identified. She was treated with oral Augmentin and wound packing. She has been treated with oxycodone 5 mg po q 4  hrs prn, gabapentin 900 mg TID. She was treated with Bactrim on 01/18/2018. She was referred to local pain management clinic on 01/27/2018.  PET scanon 11/29/2019 showed local  recurrenceof hypermetabolic nodular lung malignancy at the resection margin(1.6 x 1.5 cm; SUV 11.3)in the LEFT lower lobe. There was no evidence of hepatic metastasis. There was no evidence of local recurrence in pelvis. There was insufficiency fracture in the LEFT sacral ala presumably related to radiation treatment.  Chest CTon 12/13/2019 revealed anenlarging partially calcified2.3 x 1.2 cmlesion in the medial aspect of the left lower lobe most compatible with a pulmonary metastasis given the hypermetabolism on recent PET-CT.There were several other tiny pulmonary nodules measuring 6 mm or less in size, some of whichwere new or increased in size compared to prior examinations.  She declined surgical resection.  Chest, abdomen and pelvis CT on 02/23/2020 revealed a stable 2.6 x 1.7 cm partially calcified left infrahilar pulmonary nodule, hypermetabolic on PET scan and likely metastasis.  There were new no new or enlarging pulmonary nodules.  There were stable treated hepatic metastatic disease with no evidence of progression or local recurrence.  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  There is no evidence of incarceration or obstruction.  There was mild wall thickening of multiple loops of small bowel in the mid abdomen, possibly related to prior radiation therapy.  There was stable chronic sacral insufficiency fractures bilaterally.  There was a stable 3.7 cm fusiform infrarenal abdominal aortic aneurysm.   She is s/p 1 cycle of FOLFIRI (03/08/2020) with Fulphila support.  Cycle #1 was notable for nausea, vomiting, and diarrhea.  She declined further chemotherapy.  She received IMRT (6000 cGy) to the left lung lesion from 05/10/2020 - 05/22/2020.   Colonoscopy at [facility] 09/13/2019 did not appear malignant with no adenomatous (granulation tissue). There was diverticulosis in the descending colon, normal mucosa in the entire examined  colon and the examined portion of the ileum. There was a 9 mmlipoma in the transverse colon anda4 mm polyp at the surgical stoma.   She has a B12 deficiency.[id number] (low)on 12/03/2019. She restarted B12 injections on 12/13/2019 (last 03/08/2020).  She has folate deficiency.  Folate was 5.8 (low) on 12/03/2019 and 52 on 02/10/2020.  She is on folic acid.    She has chronic hypokalemia.  She is on potassium 20 meq po q day.  [facility] for a smallbowel obstruction. She had severe abdominal pain with associated nausea and vomiting. Abdominal and pelvicCT revealedmultiple small bowel loops whichwere dilated.Obstruction resolved spontaneously.  She was admitted to [facility] from 03/22/2020 - 03/28/2020. She presented with an incarcerated hernia and underwent parastomal hernia repair with no bowel resection on 03/22/2020.  There was a 3.7 cm infrarenal abdominal aortic aneurysm with extensive mural thrombus, similar in size compared with previous examination, although degree of mural thrombus had increased.  She does not plan to have the COVID-19 vaccine.   Symptomatically, she has severe reflux.  He denies any respiratory symptoms.  She continues to waste potassium.  Exam is stable.  Plan: 1.   Labs today:  CBC with diff, CMP, Mg, CEA. 2.Stage IV rectal carcinoma Shereceived6cycles of FOLFOX(02/18/2017 -04/22/2017).  She underwent CT guided microwave ablation of the liver lesionon 06/06/2017.  She receivedradiationand daily Xeloda (06/18/2017-08/15/2017). She underwentAPR with descending colostomy and VRAM flapon 10/24/2017.  Patient underwent a VATS wedge resection of her left lung. PET scan on 11/29/2019 revealed recurrent disease at  the lung resection margin. Chest, abdomen and pelvis CT on 02/23/2020 revealed a 2.6 x 1.7 cm left  infrahilar nodule. Patient declined left lower lobe lobectomy. NexGen sequencingconfirmed a KRAS mutation (not eligible for cetuximab). She is s/p cycle #1 FOLFIRI (03/08/2020).                           Avastin was held with cycle #1.                         She had issues with nausea, vomiting and diarrhea.                         Counts were good with Fulphila support.             Patient has undergone an interval incarcerated bowel surgery.  She has decided against further chemotherapy or surgery.  She completed SBRT to the left infrahilar nodule on 05/22/2020.  Discuss plan for follow-up chest CT in 3 months. 3. B12 and folate deficiency [id number] (low)on 12/03/2019 and folate 5.8 (low) on 12/03/2019. She receives B12 monthly (last 03/08/2020).             B12 today and monthly x 6.            4.Hypokalemia Potassium3.3 today. She is on potassium 50 mEq a day.  Increase potassium to 60 mEq a day.  Encourage patient to follow-up with Dr. 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   Gastroesophageal reflux  Rx:  Omeprazole 20 meq a day. 6.   Port flush every 6-12 weeks (patient may be due today). 7.   Please send Dr Loni Muse copy of CMP today (low potassium). 8.   RTC in 2 weeks and 6 weeks for labs (BMP, CEA). 9.   RTC after chest CT for MD assessment, labs (CBC with diff, CMP, CEA), and review of chest CT.  Addendum:  CEA has increased s/p radiation.  Repeat CEA in 2 weeks.  I discussed the assessment and treatment plan with the patient.  The patient was provided an opportunity to ask questions and all were answered.  The patient agreed with the plan and demonstrated an understanding of the instructions.  The patient was advised to call back if the symptoms worsen or if the condition fails to improve as anticipated.    Lequita Asal, MD, PhD    05/29/2020,  10:23 AM  I, Jacqualyn Posey, am acting as a Education administrator for [employer]. Mike Gip, MD.   I, Yana Schorr C. Mike Gip, MD, have reviewed the above documentation for accuracy and completeness, and I agree with the above.

## 2020-05-29 ENCOUNTER — Telehealth: Payer: Self-pay

## 2020-05-29 ENCOUNTER — Encounter: Payer: Self-pay | Admitting: Hematology and Oncology

## 2020-05-29 ENCOUNTER — Inpatient Hospital Stay: Payer: Medicare HMO

## 2020-05-29 ENCOUNTER — Other Ambulatory Visit: Payer: Self-pay

## 2020-05-29 ENCOUNTER — Inpatient Hospital Stay (HOSPITAL_BASED_OUTPATIENT_CLINIC_OR_DEPARTMENT_OTHER): Payer: Medicare HMO | Admitting: Hematology and Oncology

## 2020-05-29 VITALS — BP 132/67 | HR 91 | Temp 97.0°F | Wt 182.0 lb

## 2020-05-29 DIAGNOSIS — E538 Deficiency of other specified B group vitamins: Secondary | ICD-10-CM

## 2020-05-29 DIAGNOSIS — C7802 Secondary malignant neoplasm of left lung: Secondary | ICD-10-CM

## 2020-05-29 DIAGNOSIS — E876 Hypokalemia: Secondary | ICD-10-CM | POA: Diagnosis not present

## 2020-05-29 DIAGNOSIS — Z7189 Other specified counseling: Secondary | ICD-10-CM

## 2020-05-29 DIAGNOSIS — C20 Malignant neoplasm of rectum: Secondary | ICD-10-CM

## 2020-05-29 DIAGNOSIS — C787 Secondary malignant neoplasm of liver and intrahepatic bile duct: Secondary | ICD-10-CM

## 2020-05-29 DIAGNOSIS — K2100 Gastro-esophageal reflux disease with esophagitis, without bleeding: Secondary | ICD-10-CM

## 2020-05-29 LAB — COMPREHENSIVE METABOLIC PANEL
ALT: 10 U/L (ref 0–44)
AST: 14 U/L — ABNORMAL LOW (ref 15–41)
Albumin: 4.1 g/dL (ref 3.5–5.0)
Alkaline Phosphatase: 89 U/L (ref 38–126)
Anion gap: 8 (ref 5–15)
BUN: 10 mg/dL (ref 8–23)
CO2: 29 mmol/L (ref 22–32)
Calcium: 9 mg/dL (ref 8.9–10.3)
Chloride: 99 mmol/L (ref 98–111)
Creatinine, Ser: 0.7 mg/dL (ref 0.44–1.00)
GFR calc Af Amer: >60 mL/min (ref >60)
GFR calc non Af Amer: >60 mL/min (ref >60)
Glucose, Bld: 100 mg/dL — ABNORMAL HIGH (ref 70–99)
Potassium: 3.3 mmol/L — ABNORMAL LOW (ref 3.5–5.1)
Sodium: 136 mmol/L (ref 135–145)
Total Bilirubin: 0.4 mg/dL (ref 0.3–1.2)
Total Protein: 7.5 g/dL (ref 6.5–8.1)

## 2020-05-29 LAB — CBC WITH DIFFERENTIAL/PLATELET
Abs Immature Granulocytes: 0.02 10*3/uL (ref 0.00–0.07)
Basophils Absolute: 0 10*3/uL (ref 0.0–0.1)
Basophils Relative: 0 %
Eosinophils Absolute: 0.1 10*3/uL (ref 0.0–0.5)
Eosinophils Relative: 1 %
HCT: 41.3 % (ref 36.0–46.0)
Hemoglobin: 14.2 g/dL (ref 12.0–15.0)
Immature Granulocytes: 0 %
Lymphocytes Relative: 12 %
Lymphs Abs: 0.6 10*3/uL — ABNORMAL LOW (ref 0.7–4.0)
MCH: 31.4 pg (ref 26.0–34.0)
MCHC: 34.4 g/dL (ref 30.0–36.0)
MCV: 91.4 fL (ref 80.0–100.0)
Monocytes Absolute: 0.4 10*3/uL (ref 0.1–1.0)
Monocytes Relative: 9 %
Neutro Abs: 3.8 10*3/uL (ref 1.7–7.7)
Neutrophils Relative %: 78 %
Platelets: 236 10*3/uL (ref 150–400)
RBC: 4.52 MIL/uL (ref 3.87–5.11)
RDW: 13.2 % (ref 11.5–15.5)
WBC: 4.9 10*3/uL (ref 4.0–10.5)
nRBC: 0 % (ref 0.0–0.2)

## 2020-05-29 MED ORDER — SODIUM CHLORIDE 0.9% FLUSH
10.0000 mL | Freq: Once | INTRAVENOUS | Status: AC
  Administered 2020-05-29: 10 mL via INTRAVENOUS
  Filled 2020-05-29: qty 10

## 2020-05-29 MED ORDER — HEPARIN SOD (PORK) LOCK FLUSH 100 UNIT/ML IV SOLN
500.0000 [IU] | Freq: Once | INTRAVENOUS | Status: AC
  Administered 2020-05-29: 500 [IU] via INTRAVENOUS
  Filled 2020-05-29: qty 5

## 2020-05-29 MED ORDER — CYANOCOBALAMIN 1000 MCG/ML IJ SOLN
1000.0000 ug | Freq: Once | INTRAMUSCULAR | Status: AC
  Administered 2020-05-29: 1000 ug via INTRAMUSCULAR

## 2020-05-29 MED ORDER — OMEPRAZOLE 20 MG PO CPDR: 20.0000 mg | DELAYED_RELEASE_CAPSULE | Freq: Every day | ORAL | 1 refills | Status: DC

## 2020-05-29 NOTE — Telephone Encounter (Signed)
Labs has been routed to her PCP office.

## 2020-05-29 NOTE — Progress Notes (Signed)
The patient c/o indigestion ( severe at this time)

## 2020-05-30 ENCOUNTER — Telehealth: Payer: Self-pay

## 2020-05-30 ENCOUNTER — Other Ambulatory Visit: Payer: Self-pay

## 2020-05-30 DIAGNOSIS — C20 Malignant neoplasm of rectum: Secondary | ICD-10-CM

## 2020-05-30 DIAGNOSIS — C7802 Secondary malignant neoplasm of left lung: Secondary | ICD-10-CM

## 2020-05-30 DIAGNOSIS — C787 Secondary malignant neoplasm of liver and intrahepatic bile duct: Secondary | ICD-10-CM

## 2020-05-30 LAB — CEA: CEA: 76.7 ng/mL — ABNORMAL HIGH (ref 0.0–4.7)

## 2020-05-30 NOTE — Telephone Encounter (Signed)
-----   Message from Lequita Asal, MD sent at 05/30/2020  2:11 PM EDT ----- Regarding: Please recheck CEA in 2 weeks.  ----- Message ----- From: Buel Ream, Lab [facility] [place] Sent: 05/29/2020   9:42 AM EDT To: Lequita Asal, MD

## 2020-05-30 NOTE — Telephone Encounter (Signed)
Patient aware. Added lab order for CEA for 2 weeks.

## 2020-05-31 ENCOUNTER — Other Ambulatory Visit: Payer: Self-pay | Admitting: Hematology and Oncology

## 2020-05-31 DIAGNOSIS — E876 Hypokalemia: Secondary | ICD-10-CM

## 2020-06-04 DIAGNOSIS — K2100 Gastro-esophageal reflux disease with esophagitis, without bleeding: Secondary | ICD-10-CM | POA: Insufficient documentation

## 2020-06-13 ENCOUNTER — Inpatient Hospital Stay: Payer: Medicare HMO | Attending: Hematology and Oncology

## 2020-06-13 ENCOUNTER — Other Ambulatory Visit: Payer: Self-pay

## 2020-06-13 DIAGNOSIS — Z87891 Personal history of nicotine dependence: Secondary | ICD-10-CM | POA: Insufficient documentation

## 2020-06-13 DIAGNOSIS — C7802 Secondary malignant neoplasm of left lung: Secondary | ICD-10-CM

## 2020-06-13 DIAGNOSIS — E876 Hypokalemia: Secondary | ICD-10-CM | POA: Diagnosis not present

## 2020-06-13 DIAGNOSIS — E538 Deficiency of other specified B group vitamins: Secondary | ICD-10-CM | POA: Insufficient documentation

## 2020-06-13 DIAGNOSIS — C787 Secondary malignant neoplasm of liver and intrahepatic bile duct: Secondary | ICD-10-CM

## 2020-06-13 DIAGNOSIS — C20 Malignant neoplasm of rectum: Secondary | ICD-10-CM | POA: Insufficient documentation

## 2020-06-13 LAB — BASIC METABOLIC PANEL
Anion gap: 9 (ref 5–15)
BUN: 10 mg/dL (ref 8–23)
CO2: 29 mmol/L (ref 22–32)
Calcium: 8.8 mg/dL — ABNORMAL LOW (ref 8.9–10.3)
Chloride: 99 mmol/L (ref 98–111)
Creatinine, Ser: 0.72 mg/dL (ref 0.44–1.00)
GFR calc Af Amer: >60 mL/min (ref >60)
GFR calc non Af Amer: >60 mL/min (ref >60)
Glucose, Bld: 125 mg/dL — ABNORMAL HIGH (ref 70–99)
Potassium: 3.7 mmol/L (ref 3.5–5.1)
Sodium: 137 mmol/L (ref 135–145)

## 2020-06-14 LAB — CEA: CEA: 40.9 ng/mL — ABNORMAL HIGH (ref 0.0–4.7)

## 2020-06-19 ENCOUNTER — Other Ambulatory Visit: Payer: Self-pay | Admitting: Hematology and Oncology

## 2020-06-19 DIAGNOSIS — E876 Hypokalemia: Secondary | ICD-10-CM

## 2020-06-20 ENCOUNTER — Other Ambulatory Visit: Payer: Self-pay | Admitting: Hematology and Oncology

## 2020-06-20 DIAGNOSIS — K2100 Gastro-esophageal reflux disease with esophagitis, without bleeding: Secondary | ICD-10-CM

## 2020-06-23 ENCOUNTER — Inpatient Hospital Stay: Payer: Medicare HMO

## 2020-06-23 ENCOUNTER — Other Ambulatory Visit: Payer: Self-pay

## 2020-06-23 DIAGNOSIS — C20 Malignant neoplasm of rectum: Secondary | ICD-10-CM | POA: Diagnosis not present

## 2020-06-23 DIAGNOSIS — E876 Hypokalemia: Secondary | ICD-10-CM

## 2020-06-23 MED ORDER — CYANOCOBALAMIN 1000 MCG/ML IJ SOLN
1000.0000 ug | Freq: Once | INTRAMUSCULAR | Status: AC
  Administered 2020-06-23: 1000 ug via INTRAMUSCULAR

## 2020-06-28 ENCOUNTER — Other Ambulatory Visit: Payer: Self-pay

## 2020-06-28 ENCOUNTER — Encounter: Payer: Self-pay | Admitting: Radiation Oncology

## 2020-06-28 ENCOUNTER — Ambulatory Visit
Admission: RE | Admit: 2020-06-28 | Discharge: 2020-06-28 | Disposition: A | Discharge status: Home / Self Care | Payer: Medicare HMO | Source: Ambulatory Visit | Attending: Radiation Oncology | Admitting: Radiation Oncology

## 2020-06-28 DIAGNOSIS — Z923 Personal history of irradiation: Secondary | ICD-10-CM | POA: Diagnosis not present

## 2020-06-28 DIAGNOSIS — C7800 Secondary malignant neoplasm of unspecified lung: Secondary | ICD-10-CM

## 2020-06-28 DIAGNOSIS — C7802 Secondary malignant neoplasm of left lung: Secondary | ICD-10-CM | POA: Insufficient documentation

## 2020-06-28 DIAGNOSIS — C20 Malignant neoplasm of rectum: Secondary | ICD-10-CM | POA: Diagnosis not present

## 2020-06-28 NOTE — Progress Notes (Signed)
Radiation Oncology Follow up Note  Name: Tamara Hall   Date:   06/28/2020 MRN:  [id number] DOB: 02/10/1956    This 64 y.o. female presents to the clinic today for 1 month follow-up status post palliative radiation therapy to her left lung patient with known stage IV colorectal cancer.  REFERRING PROVIDER: Loni Muse, MD  HPI: Patient is a 64 year old female now at 1 month having completed palliative radiation therapy to her chest for a metastatic lesion consistent with stage IV colorectal cancer..  She underwent a hypofractionated course of palliative treatment to her chest and is tolerated that well.  She is also had CT-guided microwave ablation of her liver lesion.  She has been started on a proton pump inhibitor for some reflux which is really helped.  She specifically denies dysphagia at this time cough hemoptysis or chest tightness.  Dr. Mike Gip is following a slight increase in her CEA.  COMPLICATIONS OF TREATMENT: none  FOLLOW UP COMPLIANCE: keeps appointments   PHYSICAL EXAM:  BP (P) 123/66 (BP Location: Left Arm, Patient Position: Sitting)   Pulse (P) 97   Temp (!) (P) 94.4 F (34.7 C) (Tympanic)   Resp (P) 16   Wt (P) 185 lb 6.4 oz (84.1 kg)   BMI (P) 29.04 kg/m  Well-developed well-nourished patient in NAD. HEENT reveals PERLA, EOMI, discs not visualized.  Oral cavity is clear. No oral mucosal lesions are identified. Neck is clear without evidence of cervical or supraclavicular adenopathy. Lungs are clear to A&P. Cardiac examination is essentially unremarkable with regular rate and rhythm without murmur rub or thrill. Abdomen is benign with no organomegaly or masses noted. Motor sensory and DTR levels are equal and symmetric in the upper and lower extremities. Cranial nerves II through XII are grossly intact. Proprioception is intact. No peripheral adenopathy or edema is identified. No motor or sensory levels are noted. Crude visual fields are within normal  range.  RADIOLOGY RESULTS: No current films to review  PLAN: Present time patient is doing well fairly asymptomatic.  Dr. Mike Gip is following possible progression by elevation of her CEA.  I have asked to see her back in November after her next set of images.  She continues close follow-up care with Dr. Mike Gip.  Patient knows to call with any concerns at any time.  I would like to take this opportunity to thank you for allowing me to participate in the care of your patient.Noreene Filbert, MD

## 2020-07-03 NOTE — Progress Notes (Signed)
[facility]  [address], [address] [place], [place] [address] Phone: [phone]  Fax: [phone]   Clinic Day:  07/04/2020  Referring physician: Loni Muse, MD  Chief Complaint: Tamara Hall is a 64 y.o. 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  HPI: The patient was last seen in the medical oncology clinic on 05/29/2020. At that time, she had severe reflux.  She denied any respiratory symptoms.  She continued to waste potassium.  Exam was stable. Hematocrit was 41.3, hemoglobin 14.2, platelets 236,000, WBC 4,900. Potassium was 3.3. AST was 14. CEA was 76.7 (increased s/p radiation).  Labs on 06/13/2020 revealed a CEA of 40.9. Calcium was 8.8.  She received Vitamin B12 injections on 05/29/2020 and 06/23/2020.  She was seen by Dr. Baruch Gouty in radiation oncology on 06/28/2020.  She was doing well.  She has a follow-up after chest CT.  Chest CT with contrast is scheduled for 08/22/2020.  During the interim, she has been "fairly good." She states that she has been having to use her inhaler 3-4x daily. She reports wheezing and coughing. She coughs up a little bit of blood.   The patient takes 2 tbsp of milk of magnesia daily to avoid constipation. Her swallowing has normalized. Her heartburn is managed with medication. She denies dizziness, nausea, vomiting, diarrhea, blood in the stool, and black stool.   The patient has been taking 60 mg potassium daily since the end of August and never notices pills in her ostomy. Her back pain is stable. She states that she "wobbles" when she turns around quickly. She is planning on having dental work.   Past Medical History:  Diagnosis Date  . Cancer ([place])    colon, with mets to liver and left lung  . Hypertension   . Pneumonia    20+ years ago    Past Surgical History:  Procedure  Laterality Date  . ABDOMINAL HYSTERECTOMY    . BREAST SURGERY    . COLON SURGERY  10/2017   [facility]  . COLONOSCOPY WITH PROPOFOL N/A 01/14/2017   Procedure: COLONOSCOPY WITH PROPOFOL;  Surgeon: Lucilla Lame, MD;  Location: [facility] ENDOSCOPY;  Service: Endoscopy;  Laterality: N/A;  . ELBOW SURGERY Right    Has had 4 surgeries on right elbow.  Marland Kitchen HAND SURGERY Right 2008  . JOINT REPLACEMENT    . LIVER BIOPSY  10/2017  . lung wedge resection  10/2017  . PORTA CATH INSERTION N/A 01/29/2017   Procedure: Glori Luis Cath Insertion;  Surgeon: Algernon Huxley, MD;  Location: [facility];  Service: Cardiovascular;  Laterality: N/A;  . TONSILLECTOMY      Family History  Problem Relation Age of Onset  . Lung cancer Mother 52  . Esophageal cancer Father 53  . Brain cancer Maternal Grandmother     Social History:  reports that she quit smoking about 2 years ago. Her smoking use included cigarettes. She has a 80.00 pack-year smoking history. She has never used smokeless tobacco. She reports that she does not drink alcohol and does not use drugs. She was smoking 1/2 packs/day.She lives in [place]. She has 3 children (ages 32, 46, and 33). She smoked 2 packs a day. She started smoking at age 70. She initially stopped smoking on 10/24/2017. 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 Tamara Hall  is her significant other. The patient is alone today.    Allergies: No Known Allergies  Current Medications: Current Outpatient Medications  Medication Sig Dispense Refill  . albuterol (VENTOLIN HFA) 108 (90 Base) MCG/ACT inhaler Inhale into the lungs.    Marland Kitchen amLODipine (NORVASC) 5 MG tablet Take 5 mg by mouth daily.    . baclofen (LIORESAL) 10 MG tablet Take 10 mg by mouth 3 (three) times daily.    . folic acid (FOLVITE) 1 MG tablet TAKE 1 TABLET BY MOUTH EVERY DAY 90 tablet 1  . HYDROcodone-acetaminophen (NORCO/VICODIN) 5-325 MG tablet Take 1 tablet by mouth every 6  (six) hours as needed for moderate pain. 15 tablet 0  . KLOR-CON M10 10 MEQ tablet TAKE 1 TABLET (10 MEQ TOTAL) BY MOUTH 5 (FIVE) TIMES DAILY. 120 tablet 0  . LORazepam (ATIVAN) 0.5 MG tablet Take 1 tablet (0.5 mg total) by mouth every 6 (six) hours as needed (NAUSEA). 30 tablet 0  . nortriptyline (PAMELOR) 10 MG capsule Take 10 mg by mouth at bedtime.    Marland Kitchen omeprazole (PRILOSEC) 20 MG capsule Take 1 capsule (20 mg total) by mouth daily. 30 capsule 1  . Ostomy Supplies (PREMIER DRAINABLE POUCH 64MM) Pouch MISC     . gabapentin (NEURONTIN) 300 MG capsule Take 1,200 mg by mouth 3 (three) times daily.     . hydrochlorothiazide (HYDRODIURIL) 25 MG tablet Take 25 mg by mouth daily.     No current facility-administered medications for this visit.    Review of Systems  Constitutional: Negative for chills, diaphoresis, fever, malaise/fatigue and weight loss (6 pounds).       Feels "fairly good."  HENT: Negative.  Negative for congestion, ear discharge, ear pain, hearing loss, nosebleeds, sinus pain, sore throat and tinnitus.   Eyes: Negative.  Negative for blurred vision and double vision.  Respiratory: Positive for cough, hemoptysis (mild) and wheezing. Negative for sputum production and shortness of breath.   Cardiovascular: Negative.  Negative for chest pain, palpitations and leg swelling.  Gastrointestinal: Negative for abdominal pain, blood in stool, constipation (takes milk of magnesia daily), diarrhea, heartburn (takes medication every morning), melena, nausea and vomiting.       S/p parastomal hernia repair on 03/22/2020.  Genitourinary: Negative.  Negative for dysuria, frequency and urgency.  Musculoskeletal: Positive for back pain. Negative for falls, joint pain, myalgias and neck pain.  Skin: Negative.  Negative for itching and rash.  Neurological: Negative.  Negative for dizziness, sensory change, speech change, weakness and headaches.       "Wobbly" balance.  Endo/Heme/Allergies:  Negative.  Negative for environmental allergies. Does not bruise/bleed easily.  Psychiatric/Behavioral: Negative.  Negative for depression and memory loss. The patient is not nervous/anxious and does not have insomnia.   All other systems reviewed and are negative.  Performance status (ECOG): 1  Vitals Blood pressure (!) 147/65, pulse 91, temperature (!) 95 F (35 C), temperature source Tympanic, resp. rate 18, weight 187 lb 6.3 oz (85 kg), SpO2 97 %.   Physical Exam Vitals and nursing note reviewed.  Constitutional:      General: She is not in acute distress.    Appearance: Normal appearance. She is well-developed.     Interventions: Face mask in place.     Comments:    HENT:     Head: Normocephalic and atraumatic.     Comments: Long hair.    Mouth/Throat:     Mouth: Mucous membranes are moist. No oral lesions.  Pharynx: Oropharynx is clear.  Eyes:     General: No scleral icterus.    Conjunctiva/sclera: Conjunctivae normal.     Pupils: Pupils are equal, round, and reactive to light.     Comments: Blue eyes.  Neck:     Vascular: No JVD.  Cardiovascular:     Rate and Rhythm: Normal rate and regular rhythm.     Heart sounds: Normal heart sounds. No murmur heard.  No friction rub. No gallop.   Pulmonary:     Effort: Pulmonary effort is normal. No respiratory distress.     Breath sounds: Normal breath sounds. No wheezing, rhonchi or rales.  Chest:     Chest wall: No tenderness.  Abdominal:     General: Bowel sounds are normal. There is no distension.     Palpations: Abdomen is soft. There is no mass.     Tenderness: There is no abdominal tenderness. There is no guarding or rebound.     Comments: Weakness in abdominal wall, LLQ  Musculoskeletal:        General: No swelling or tenderness. Normal range of motion.     Cervical back: Normal range of motion and neck supple.  Lymphadenopathy:     Head:     Right side of head: No preauricular, posterior auricular or occipital  adenopathy.     Left side of head: No preauricular, posterior auricular or occipital adenopathy.     Cervical: No cervical adenopathy.     Upper Body:     Right upper body: No supraclavicular or axillary adenopathy.     Left upper body: No supraclavicular or axillary adenopathy.     Lower Body: No right inguinal adenopathy. No left inguinal adenopathy.  Skin:    General: Skin is warm.     Coloration: Skin is not pale.     Findings: No bruising, erythema, lesion or rash.  Neurological:     Mental Status: She is alert and oriented to person, place, and time.  Psychiatric:        Behavior: Behavior normal.        Thought Content: Thought content normal.        Judgment: Judgment normal.    Imaging studies: 01/13/2017:Abdomen and pelvic CTrevealed irregular thickening of the rectum suspicious for carcinoma. There was a 1.6 cm hypoattenuating lesion in the right hepatic lobe worrisome for metastatic disease. 01/15/2017:Chest CT revealed a 9 mm noncalcified left lower lobe pulmonary nodule (new). 01/16/2017:Pelvic MRIrevealed rectal adenocarcinoma T3N1. There was extension beyond the muscularis propria (17 mm). There were mesorectal nodes >= 5 mm. There was adenopathy on the left side of the mesorectum, including a 9 mm lesion. The distance from the tumor to the anal sphincter was 8 mm. 01/27/2017:PET scanrevealed intense metabolic activity associated rectal mass consists with primary rectal carcinoma. There was a hypermetabolic metastatic lesion to the central LEFT hepatic lobe. There was metabolic activity associated with small LEFT lower lobe nodule which was most consistent with pulmonary metastasis. 01/31/2017:Liver MRIrevealed a 2.6 x 2.7 x 2.1 cm lesion in segment VIII of the liver near the junction with segment IVa c/w a metastatic lesion. There was a 1.1 x 0.8 cm suspected metastatic lesion peripherally in segment VI of the liver.  05/16/2017:Abdomen and pelvic  CTrevealed persistent but decreased asymmetric wall thickening in the rectum. The medial segment left liver lesion was smaller (2.7 x 2.6 cm to 1.9 x 1.6 cm). A tiny lesion seen in segment VI of the liver on previous MRI  was not evident on today's CT scan. The left lower lobe pulmonary nodule was smaller (9 mm to 6 mm). There was no new or progressive findings in the abdomen and pelvis. 08/19/2017:Chest CTwith contrast on 08/19/2017 revealed left lower lobe 6 mm solid pulmonary nodule, decreased since 01/15/2017 chest CT. Stable 7 mm right lower lobe pulmonary nodule. There was no new or progressive metastatic disease in the chest.  09/03/2018: Pelvic [facility] at [facility] demonstrated a partial response of the primary tumor and extramural disease. Post treatment category ymrT T3b. There was an overall decrease in size in the previously seen perirectal node. Hypointense changes at the site of prior tumor focally contacedt buts does not definitely invade the LEFT levator ani. Tumor contacted, but does not definitely invade the internal anal sphincter. No evidence of residual or recurrent hepatic disease following microwave ablation. 02/17/2018:Abdomen and pelvic CTrevealed evolution of the ablation zone in segment 4A, now measuring 3.7 x 3.1 cm, decreased.There was no findings to suggest viable tumor.She is s/pabdominoperineal resection with left lower quadrant colostomy. There was no evidence of new/progressive metastatic disease.She is s/pleft lower lobe pulmonary wedge resection, incompletely visualized. 03/04/2018:Chest CTrevealed a stable chest CT (compared to11/13/2018). The index right lower lobe lung nodule measured7 mm (unchanged). There was no new or progressive nodularity identified. There was continued decrease in size of ablation defect within segment 4a of the liver. 08/13/2019:Abdominaland pelvisCT at [facility] of parastomal hernia with small bowel as well as  multiple small bowel loops whichwere dilated.There was hepatic steatosis and post-treatment changes in the pelvis with persistent presacral soft tissue. There was a 3.5 cm infrarenal abdominal aortic aneurysm. 09/09/2019:C[facility] at [facility] no intrathoracic metastasis and no new or enlarging pulmonary nodules. 09/09/2019:Abdomen and pelvis CT at [facility] multiple subcentimeter hypodensities in the dome of the liver, which appeared new/more conspicuous from prior CT. There were not seen on the prior MRI. Further evaluation with MRI was recommended. CT evaluationwas limited due to background fatty liver and the heterogeneity/these lesions could have been secondary to fatty liver. There was unchanged soft tissue stranding and nodularity in the pelvis, with a few prominent more nodular areas. There were a few more prominent indeterminateareasand short-term follow up was recommended to ensure stability and exclude peritoneal disease. Further evaluation with MRI could have been helpful. 11/12/2019: Liver and pelvis MRIrevealedno findings to explain the patient's history of rising CEA. There was irregular presacral soft tissue with apparent scarring noted in the left paracentral pelviswas similar to prior studies and presumably treatment related. PET-CT could be used, as clinically warranted to exclude hypermetabolic disease within thatregion. There was an ablation defect identified in segment IV with minimal irregular enhancement along the anterosuperior margin, indeterminate. Attention on follow-up recommended.There was a tiny cyst identified in the dome of the right liver without other findings to suggest metastatic disease in the hepatic parenchyma. 11/29/2019:PET scanrevealed 1.6 x 1.5 cmnodularthickeningat the resection margin (SUV 11.3)in the LEFT lower lobe. There was no evidence of hepatic metastasis. There was no evidence of local recurrence in pelvis. There was  insufficiency fracture in the LEFT sacral ala presumably related to radiation treatment. 02/23/2020:  Chest, abdomen and pelvis CT revealed a stable 2.6 x 1.7 cm partially calcified left infrahilar pulmonary nodule, hypermetabolic on PET scan and likely metastasis.  There were new no new or enlarging pulmonary nodules.  There were stable treated hepatic metastatic disease with no evidence of progression or local recurrence.  There was a stable parastomal herniation of the small bowel  around the descending colostomy.  There is no evidence of incarceration or obstruction.  There was mild wall thickening of multiple loops of small bowel in the mid abdomen, possibly related to prior radiation therapy.  There was stable chronic sacral insufficiency fractures bilaterally.  There was a stable 3.7 cm fusiform infrarenal abdominal aortic aneurysm.  03/22/2020:  Abdomen and pelvis CT at [facility] revealed a high-grade small bowel obstruction. There were 2 separate loops of small bowel contained within the parastomal hernia. The first was more proximal small bowel which appeared dilated but nonobstructed. The second segment of included small bowel was more distal small bowel which appeared dilated entering into the herniaand collapsed exiting the hernia, consistent with point of obstruction.  There was a 3.7 cm infrarenal abdominal aortic aneurysm with extensive mural thrombus, similar in size compared with previous examination, although degree of mural thrombus had increased. Additional calcified atherosclerotic plaque was noted throughout the abdominal aorta and its branches.    No visits with results within 3 Day(s) from this visit.  Latest known visit with results is:  Appointment on 06/13/2020  Component Date Value Ref Range Status  . Sodium 06/13/2020 137  135 - 145 mmol/L Final  . Potassium 06/13/2020 3.7  3.5 - 5.1 mmol/L Final  . Chloride 06/13/2020 99  98 - 111 mmol/L Final  . CO2 06/13/2020 29  22 - 32 mmol/L  Final  . Glucose, Bld 06/13/2020 125* 70 - 99 mg/dL Final   Glucose reference range applies only to samples taken after fasting for at least 8 hours.  . BUN 06/13/2020 10  8 - 23 mg/dL Final  . Creatinine, Ser 06/13/2020 0.72  0.44 - 1.00 mg/dL Final  . Calcium 06/13/2020 8.8* 8.9 - 10.3 mg/dL Final  . GFR calc non Af Amer 06/13/2020 >60  >60 mL/min Final  . GFR calc Af Amer 06/13/2020 >60  >60 mL/min Final  . Anion gap 06/13/2020 9  5 - 15 Final   Performed at [facility], [address] [place], [place] [address]  . CEA 06/13/2020 40.9* 0.0 - 4.7 ng/mL Final   Comment: (NOTE)                             Nonsmokers          <3.9                             Smokers             <5.6 [employer] Electrochemiluminescence Immunoassay (ECLIA) Values obtained with different assay methods or kits cannot be used interchangeably.  Results cannot be interpreted as absolute evidence of the presence or absence of malignant disease. Performed At: [facility] [place], [place] [id number] Rush Farmer MD XN:[id number]     Assessment:  MATTILYNN FORRER is a 64 y.o. female with clinical stage T3N1M1rectal cancer. She presented with a 9 month history of rectal bleeding. Colonoscopyon 01/14/2017 revealed afrond-like/villous non-obstructing large mass in the rectum. The mass was non-circumferential. Biopsies revealed high grade dysplasia within an adenoma (? sampling error). CEAwas 31.6 on 01/15/2017.  PET scanon 01/27/2017 revealed intense metabolic activity associated rectal mass consists with primary rectal carcinoma. There was a hypermetabolic metastatic lesion to the central LEFT hepatic lobe. There was metabolic activity associated with small LEFT lower lobe nodule which was most consistent with pulmonary metastasis.  CT guided liver biopsyat [facility] on 02/07/2017 revealed malignant cells c/w metastatic colorectal adenocarcinoma. [facility] on  12/29/[id number]) revealed MS-stable, 1 Muts/Mb tumor mutational burden; KRAS G12C, PTEN rearrangement intron 8, APC G1106*, T144f*18, and NRAS wildtype.  There were no reportable mutations in BRAF and NRAS.     She received 2 cycles of FOLFOX (02/18/2017 - 03/04/2017). She developed transient cold induced neuropathy. She developed wheezing and hypoxia with cycle #2 felt secondary to oxaliplatin. She received 4 cycles ofFOLFIRI(03/24/2017 - 04/22/2017) with Neulasta support. Diarrhea was controlled with Imodium.  She underwent CT guided microwave ablation of the liver lesionon 06/06/2017.   She began radiationon 06/18/2017. She receiveddaily XMelody Haverradiation. Radiation and Xeloda completed on 08/15/2017.  She underwentAPR with descending colostomy and VRAM flapon 10/24/2017. She underwent a VATS wedge resectionto her LEFT lung. Pathology from the lung demonstrateda 1 cmmetastatic intestinal type adenocarcinoma c/w metastasis from patient's known colorectal carcinoma.Pathology from the rectal resection revealed a 2.1 cm invasive moderately differentiated adenocarcinoma arising in a tubular adenoma with transmural invasion focally into the perirectal soft tissue. There was no lymphovascular or perineural invasion. Margins were negative. Zero of 20 lymph nodes were positive. Final diagnosiswas [id number] adenocarcinoma with metastasis to left lower lobe of the lung.  She has had post-operative pain and ostomy ischemia. She underwent surgical ostomy revisionon 11/28/2017. Pathologyshowed focal mucosal erosion and fat necrosis c/w ischemia. There was extensive underlying fat necrosis. No dysplasia or carcinoma was identified. She was treated with oral Augmentin and wound packing. She has been treated with oxycodone 5 mg po q 4 hrs prn, gabapentin 900 mg TID. She was treated with Bactrim on 01/18/2018. She was referred to local pain management clinic  on 01/27/2018.  PET scanon 11/29/2019 showed local recurrenceof hypermetabolic nodular lung malignancy at the resection margin(1.6 x 1.5 cm; SUV 11.3)in the LEFT lower lobe. There was no evidence of hepatic metastasis. There was no evidence of local recurrence in pelvis. There was insufficiency fracture in the LEFT sacral ala presumably related to radiation treatment.  Chest CTon 12/13/2019 revealed anenlarging partially calcified2.3 x 1.2 cmlesion in the medial aspect of the left lower lobe most compatible with a pulmonary metastasis given the hypermetabolism on recent PET-CT.There were several other tiny pulmonary nodules measuring 6 mm or less in size, some of whichwere new or increased in size compared to prior examinations.  She declined surgical resection.  Chest, abdomen and pelvis CT on 02/23/2020 revealed a stable 2.6 x 1.7 cm partially calcified left infrahilar pulmonary nodule, hypermetabolic on PET scan and likely metastasis.  There were new no new or enlarging pulmonary nodules.  There were stable treated hepatic metastatic disease with no evidence of progression or local recurrence.  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  There is no evidence of incarceration or obstruction.  There was mild wall thickening of multiple loops of small bowel in the mid abdomen, possibly related to prior radiation therapy.  There was stable chronic sacral insufficiency fractures bilaterally.  There was a stable 3.7 cm fusiform infrarenal abdominal aortic aneurysm.   She is s/p 1 cycle of FOLFIRI (03/08/2020) with Fulphila support.  Cycle #1 was notable for nausea, vomiting, and diarrhea.  She declined further chemotherapy.  She received IMRT (6000 cGy) to the left lung lesion from 05/10/2020 - 05/22/2020.   CEAhas been followed: 31.6 on 01/15/2017, 38.1 on 02/18/2017, 25.1 on 03/18/2017, 11.8 on 04/08/2017, 9.2 on 04/22/2017, 9.5 on  05/20/2017,  9.6 on 06/19/2017, 3.7 on 02/18/2018,5.8 on 06/26/2018, 10.5 on 11/20/2018, 19.5 on 03/26/2019, 34.0 on 07/21/2019, 46.4 on 09/09/2019, 54.4 on 10/28/2019, 108.0 on 02/10/2020, 121.0 on 03/08/2020, 49.8 on 04/13/2020, 76.7 on 05/29/2020, and 40.9 on 06/13/2020.  Colonoscopy at [facility] 09/13/2019 did not appear malignant with no adenomatous (granulation tissue). There was diverticulosis in the descending colon, normal mucosa in the entire examined colon and the examined portion of the ileum. There was a 9 mmlipoma in the transverse colon anda4 mm polyp at the surgical stoma.   She has a B12 deficiency.[id number] (low)on 12/03/2019. She restarted B12 injections on 12/13/2019 (last 06/23/2020).  She has folate deficiency.  Folate was 5.8 (low) on 12/03/2019 and 52 on 02/10/2020.  She is on folic acid.    She has chronic hypokalemia.  She is on potassium 20 meq po q day.  [facility] for a smallbowel obstruction. She had severe abdominal pain with associated nausea and vomiting. Abdominal and pelvicCT revealedmultiple small bowel loops whichwere dilated.Obstruction resolved spontaneously.  She was admitted to [facility] from 03/22/2020 - 03/28/2020. She presented with an incarcerated hernia and underwent parastomal hernia repair with no bowel resection on 03/22/2020.  There was a 3.7 cm infrarenal abdominal aortic aneurysm with extensive mural thrombus, similar in size compared with previous examination, although degree of mural thrombus had increased.  She does not plan to have the COVID-19 vaccine.   Symptomatically, she feels "fairly good." She denies any abdominal pain.  Exam reveals weakness in the LLQ of the abdominal wall.  Plan: 1.   Labs today: CBC with diff, CMP, CEA. 2.Stage IV rectal carcinoma Shereceived6cycles of FOLFOX(02/18/2017 -04/22/2017).  She underwent CT guided microwave ablation  of the liver lesionon 06/06/2017.  She receivedradiationand daily Xeloda (06/18/2017-08/15/2017). She underwentAPR with descending colostomy and VRAM flapon 10/24/2017.  Patient underwent a VATS wedge resection of her left lung. 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 Chest, abdomen and pelvis CT on 02/23/2020 revealed a 2.6 x 1.7 cm left infrahilar nodule. Patient declined left lower lobe lobectomy. Nex[employer] sequencingconfirmed a KRAS mutation (not eligible for cetuximab). She is s/p cycle #1 FOLFIRI (03/08/2020).               She has decided against further chemotherapy or surgery.  She completed SBRT to the left infrahilar nodule on 05/22/2020.  CEA was 40.9 on 06/13/2020.   Symptomatically, she is doing well.   Discuss plan for follow-up chest CT on 08/22/2020. 3. B12 and folate deficiency [id number] (low)on 12/03/2019 and folate 5.8 (low) on 12/03/2019. She receives B12 monthly (last 06/23/2020).             Continue B12 monthly.            4.Hypokalemia Potassium3.5 today. She is on potassium 60 mEq a day.  Patient to follow-up with Dr. 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   RTC in 1 month for labs (BMP, folate, CEA). 6.   RTC on 11/18 for MD assessment, labs (CBC with diff, CMP), and review of chest CT.  I discussed the assessment and treatment plan with the patient.  The patient was provided an opportunity to ask questions and all were answered.  The patient agreed with the plan and demonstrated an understanding of the instructions.  The patient was advised to call back if the symptoms worsen or if the condition fails to improve as anticipated.   I provided 17 minutes of face-to-face time during  this this encounter and > 50% was spent counseling as documented  under my assessment and plan.   Lequita Asal, MD, PhD    07/04/2020, 11:49 AM  I, Mirian Mo Tufford, am acting as a Education administrator for [employer]. Mike Gip, MD.   I, Gusta Marksberry C. Mike Gip, MD, have reviewed the above documentation for accuracy and completeness, and I agree with the above.

## 2020-07-04 ENCOUNTER — Telehealth: Payer: Self-pay

## 2020-07-04 ENCOUNTER — Other Ambulatory Visit: Payer: Self-pay

## 2020-07-04 ENCOUNTER — Inpatient Hospital Stay: Payer: Medicare HMO

## 2020-07-04 ENCOUNTER — Encounter: Payer: Self-pay | Admitting: Hematology and Oncology

## 2020-07-04 ENCOUNTER — Inpatient Hospital Stay (HOSPITAL_BASED_OUTPATIENT_CLINIC_OR_DEPARTMENT_OTHER): Payer: Medicare HMO | Admitting: Hematology and Oncology

## 2020-07-04 VITALS — BP 147/65 | HR 91 | Temp 95.0°F | Resp 18 | Wt 187.4 lb

## 2020-07-04 DIAGNOSIS — C7802 Secondary malignant neoplasm of left lung: Secondary | ICD-10-CM | POA: Diagnosis not present

## 2020-07-04 DIAGNOSIS — C20 Malignant neoplasm of rectum: Secondary | ICD-10-CM

## 2020-07-04 DIAGNOSIS — E876 Hypokalemia: Secondary | ICD-10-CM

## 2020-07-04 DIAGNOSIS — E538 Deficiency of other specified B group vitamins: Secondary | ICD-10-CM

## 2020-07-04 DIAGNOSIS — Z7189 Other specified counseling: Secondary | ICD-10-CM

## 2020-07-04 LAB — BASIC METABOLIC PANEL
Anion gap: 11 (ref 5–15)
BUN: 10 mg/dL (ref 8–23)
CO2: 28 mmol/L (ref 22–32)
Calcium: 8.8 mg/dL — ABNORMAL LOW (ref 8.9–10.3)
Chloride: 99 mmol/L (ref 98–111)
Creatinine, Ser: 0.79 mg/dL (ref 0.44–1.00)
GFR calc Af Amer: >60 mL/min (ref >60)
GFR calc non Af Amer: >60 mL/min (ref >60)
Glucose, Bld: 125 mg/dL — ABNORMAL HIGH (ref 70–99)
Potassium: 3.5 mmol/L (ref 3.5–5.1)
Sodium: 138 mmol/L (ref 135–145)

## 2020-07-04 LAB — MAGNESIUM: Magnesium: 2 mg/dL (ref 1.7–2.4)

## 2020-07-04 NOTE — Progress Notes (Signed)
Patient here for oncology follow-up appointment, cough from radiation per patient, no other concerns.

## 2020-07-04 NOTE — Telephone Encounter (Signed)
spoke with the patient to inform her that her  Potassium great at 3.5 magnesium was normal. The patient was understanding and agreeable.

## 2020-07-04 NOTE — Telephone Encounter (Signed)
-----   Message from Lequita Asal, MD sent at 07/04/2020  1:08 PM EDT ----- Regarding: Please call patient  Potassium great at 3.5.   Magnesium is normal.  M ----- Message ----- From: Buel Ream, [facility] [place] Sent: 07/04/2020  12:35 PM EDT To: Lequita Asal, MD

## 2020-07-11 ENCOUNTER — Inpatient Hospital Stay: Payer: Medicare HMO

## 2020-07-17 ENCOUNTER — Other Ambulatory Visit: Payer: Self-pay | Admitting: Hematology and Oncology

## 2020-07-17 DIAGNOSIS — E876 Hypokalemia: Secondary | ICD-10-CM

## 2020-07-17 DIAGNOSIS — E538 Deficiency of other specified B group vitamins: Secondary | ICD-10-CM

## 2020-07-21 ENCOUNTER — Inpatient Hospital Stay: Payer: Medicare HMO

## 2020-07-31 ENCOUNTER — Other Ambulatory Visit: Payer: Self-pay | Admitting: *Deleted

## 2020-07-31 DIAGNOSIS — E876 Hypokalemia: Secondary | ICD-10-CM

## 2020-08-01 MED ORDER — POTASSIUM CHLORIDE CRYS ER 10 MEQ PO TBCR: EXTENDED_RELEASE_TABLET | ORAL | 0 refills | Status: DC

## 2020-08-03 ENCOUNTER — Ambulatory Visit: Payer: Medicare HMO

## 2020-08-03 ENCOUNTER — Other Ambulatory Visit: Payer: Medicare HMO

## 2020-08-04 ENCOUNTER — Other Ambulatory Visit: Payer: Medicare HMO

## 2020-08-04 ENCOUNTER — Ambulatory Visit: Payer: Medicare HMO

## 2020-08-07 ENCOUNTER — Other Ambulatory Visit: Payer: Self-pay

## 2020-08-07 ENCOUNTER — Inpatient Hospital Stay: Payer: Medicare HMO

## 2020-08-07 ENCOUNTER — Inpatient Hospital Stay: Payer: Medicare HMO | Attending: Hematology and Oncology

## 2020-08-07 ENCOUNTER — Ambulatory Visit: Payer: Medicare HMO

## 2020-08-07 ENCOUNTER — Other Ambulatory Visit: Payer: Medicare HMO

## 2020-08-07 DIAGNOSIS — Z23 Encounter for immunization: Secondary | ICD-10-CM | POA: Diagnosis not present

## 2020-08-07 DIAGNOSIS — C20 Malignant neoplasm of rectum: Secondary | ICD-10-CM | POA: Diagnosis not present

## 2020-08-07 DIAGNOSIS — E538 Deficiency of other specified B group vitamins: Secondary | ICD-10-CM | POA: Diagnosis not present

## 2020-08-07 DIAGNOSIS — C7802 Secondary malignant neoplasm of left lung: Secondary | ICD-10-CM | POA: Diagnosis not present

## 2020-08-07 DIAGNOSIS — E876 Hypokalemia: Secondary | ICD-10-CM

## 2020-08-07 LAB — BASIC METABOLIC PANEL
Anion gap: 9 (ref 5–15)
BUN: 7 mg/dL — ABNORMAL LOW (ref 8–23)
CO2: 28 mmol/L (ref 22–32)
Calcium: 8.4 mg/dL — ABNORMAL LOW (ref 8.9–10.3)
Chloride: 96 mmol/L — ABNORMAL LOW (ref 98–111)
Creatinine, Ser: 0.73 mg/dL (ref 0.44–1.00)
GFR, Estimated: >60 mL/min (ref >60)
Glucose, Bld: 108 mg/dL — ABNORMAL HIGH (ref 70–99)
Potassium: 3.7 mmol/L (ref 3.5–5.1)
Sodium: 133 mmol/L — ABNORMAL LOW (ref 135–145)

## 2020-08-07 MED ORDER — CYANOCOBALAMIN 1000 MCG/ML IJ SOLN
1000.0000 ug | Freq: Once | INTRAMUSCULAR | Status: AC
  Administered 2020-08-07: 1000 ug via INTRAMUSCULAR
  Filled 2020-08-07: qty 1

## 2020-08-07 MED ORDER — HEPARIN SOD (PORK) LOCK FLUSH 100 UNIT/ML IV SOLN
500.0000 [IU] | Freq: Once | INTRAVENOUS | Status: AC
  Administered 2020-08-07: 500 [IU] via INTRAVENOUS
  Filled 2020-08-07: qty 5

## 2020-08-07 MED ORDER — INFLUENZA VAC SPLIT QUAD 0.5 ML IM SUSY
0.5000 mL | PREFILLED_SYRINGE | Freq: Once | INTRAMUSCULAR | Status: AC
  Administered 2020-08-07: 0.5 mL via INTRAMUSCULAR
  Filled 2020-08-07: qty 0.5

## 2020-08-08 ENCOUNTER — Other Ambulatory Visit: Payer: Self-pay | Admitting: Hematology and Oncology

## 2020-08-08 LAB — FOLATE: Folate: 68 ng/mL (ref >5.9)

## 2020-08-08 LAB — CEA: CEA: 7.7 ng/mL — ABNORMAL HIGH (ref 0.0–4.7)

## 2020-08-11 ENCOUNTER — Telehealth: Payer: Self-pay

## 2020-08-11 NOTE — Telephone Encounter (Signed)
-----   Message from Lequita Asal, MD sent at 08/08/2020  6:27 AM EDT ----- Regarding: Please call patient  Potassium looks goods.  Continue current potassium supplementation.  We will contact her later today regarding her calcium (pending albumen).  M ----- Message ----- From: Buel Ream, [facility] [place] Sent: 08/07/2020   2:27 PM EDT To: Lequita Asal, MD

## 2020-08-11 NOTE — Telephone Encounter (Signed)
Spoke with the patient to inform her of her labs results. The patient was understanding and agreeable.

## 2020-08-12 ENCOUNTER — Other Ambulatory Visit: Payer: Self-pay | Admitting: Hematology and Oncology

## 2020-08-12 DIAGNOSIS — K2100 Gastro-esophageal reflux disease with esophagitis, without bleeding: Secondary | ICD-10-CM

## 2020-08-18 ENCOUNTER — Inpatient Hospital Stay: Payer: Medicare HMO

## 2020-08-22 ENCOUNTER — Other Ambulatory Visit: Payer: Self-pay

## 2020-08-22 ENCOUNTER — Other Ambulatory Visit: Payer: Medicare HMO

## 2020-08-22 ENCOUNTER — Ambulatory Visit
Admission: RE | Admit: 2020-08-22 | Discharge: 2020-08-22 | Disposition: A | Discharge status: Home / Self Care | Payer: Medicare HMO | Source: Ambulatory Visit | Attending: Hematology and Oncology | Admitting: Hematology and Oncology

## 2020-08-22 DIAGNOSIS — C7802 Secondary malignant neoplasm of left lung: Secondary | ICD-10-CM | POA: Insufficient documentation

## 2020-08-22 DIAGNOSIS — C787 Secondary malignant neoplasm of liver and intrahepatic bile duct: Secondary | ICD-10-CM

## 2020-08-22 DIAGNOSIS — E538 Deficiency of other specified B group vitamins: Secondary | ICD-10-CM

## 2020-08-22 DIAGNOSIS — E876 Hypokalemia: Secondary | ICD-10-CM

## 2020-08-22 DIAGNOSIS — K2100 Gastro-esophageal reflux disease with esophagitis, without bleeding: Secondary | ICD-10-CM

## 2020-08-22 MED ORDER — IOHEXOL 300 MG/ML  SOLN
100.0000 mL | Freq: Once | INTRAMUSCULAR | Status: AC | PRN
  Administered 2020-08-22: 75 mL via INTRAVENOUS

## 2020-08-22 MED ORDER — IOHEXOL 300 MG/ML  SOLN: 75.0000 mL | Freq: Once | INTRAMUSCULAR | Status: DC | PRN

## 2020-08-23 ENCOUNTER — Encounter: Payer: Self-pay | Admitting: Hematology and Oncology

## 2020-08-23 NOTE — Progress Notes (Signed)
Patient called for pre assessment. She states she is having pain in her back and rates pain at 4. She also would like to discuss alternative medication.  Reports shortness of breath due to radiation and inhaler she was prescribed is not helping Would like to see about getting something different.

## 2020-08-24 ENCOUNTER — Encounter: Payer: Self-pay | Admitting: Intensive Care

## 2020-08-24 ENCOUNTER — Inpatient Hospital Stay: Payer: Medicare HMO

## 2020-08-24 ENCOUNTER — Emergency Department
Admission: EM | Admit: 2020-08-24 | Discharge: 2020-08-24 | Disposition: A | Discharge status: Home / Self Care | Payer: Medicare HMO | Attending: Emergency Medicine | Admitting: Emergency Medicine

## 2020-08-24 ENCOUNTER — Emergency Department: Payer: Medicare HMO

## 2020-08-24 ENCOUNTER — Inpatient Hospital Stay (HOSPITAL_BASED_OUTPATIENT_CLINIC_OR_DEPARTMENT_OTHER): Payer: Medicare HMO | Admitting: Hematology and Oncology

## 2020-08-24 ENCOUNTER — Other Ambulatory Visit: Payer: Self-pay

## 2020-08-24 ENCOUNTER — Telehealth: Payer: Self-pay

## 2020-08-24 ENCOUNTER — Encounter: Payer: Self-pay | Admitting: Hematology and Oncology

## 2020-08-24 VITALS — BP 135/58 | HR 102 | Temp 96.5°F | Resp 18 | Wt 185.4 lb

## 2020-08-24 DIAGNOSIS — K2100 Gastro-esophageal reflux disease with esophagitis, without bleeding: Secondary | ICD-10-CM

## 2020-08-24 DIAGNOSIS — F17210 Nicotine dependence, cigarettes, uncomplicated: Secondary | ICD-10-CM | POA: Diagnosis not present

## 2020-08-24 DIAGNOSIS — Z9660 Presence of unspecified orthopedic joint implant: Secondary | ICD-10-CM | POA: Insufficient documentation

## 2020-08-24 DIAGNOSIS — E876 Hypokalemia: Secondary | ICD-10-CM

## 2020-08-24 DIAGNOSIS — E538 Deficiency of other specified B group vitamins: Secondary | ICD-10-CM

## 2020-08-24 DIAGNOSIS — C787 Secondary malignant neoplasm of liver and intrahepatic bile duct: Secondary | ICD-10-CM

## 2020-08-24 DIAGNOSIS — Z23 Encounter for immunization: Secondary | ICD-10-CM | POA: Diagnosis not present

## 2020-08-24 DIAGNOSIS — C7802 Secondary malignant neoplasm of left lung: Secondary | ICD-10-CM | POA: Diagnosis not present

## 2020-08-24 DIAGNOSIS — R0602 Shortness of breath: Secondary | ICD-10-CM

## 2020-08-24 DIAGNOSIS — Z7189 Other specified counseling: Secondary | ICD-10-CM

## 2020-08-24 DIAGNOSIS — R059 Cough, unspecified: Secondary | ICD-10-CM | POA: Diagnosis not present

## 2020-08-24 DIAGNOSIS — Z79899 Other long term (current) drug therapy: Secondary | ICD-10-CM | POA: Insufficient documentation

## 2020-08-24 DIAGNOSIS — C20 Malignant neoplasm of rectum: Secondary | ICD-10-CM | POA: Diagnosis not present

## 2020-08-24 DIAGNOSIS — R062 Wheezing: Secondary | ICD-10-CM | POA: Diagnosis not present

## 2020-08-24 DIAGNOSIS — Z923 Personal history of irradiation: Secondary | ICD-10-CM | POA: Diagnosis not present

## 2020-08-24 DIAGNOSIS — I10 Essential (primary) hypertension: Secondary | ICD-10-CM | POA: Insufficient documentation

## 2020-08-24 LAB — CBC WITH DIFFERENTIAL/PLATELET
Abs Immature Granulocytes: 0.04 10*3/uL (ref 0.00–0.07)
Abs Immature Granulocytes: 0.05 10*3/uL (ref 0.00–0.07)
Basophils Absolute: 0 10*3/uL (ref 0.0–0.1)
Basophils Absolute: 0 10*3/uL (ref 0.0–0.1)
Basophils Relative: 1 %
Basophils Relative: 0 %
Eosinophils Absolute: 0.1 10*3/uL (ref 0.0–0.5)
Eosinophils Absolute: 0.1 10*3/uL (ref 0.0–0.5)
Eosinophils Relative: 1 %
Eosinophils Relative: 1 %
HCT: 39.1 % (ref 36.0–46.0)
HCT: 38.8 % (ref 36.0–46.0)
Hemoglobin: 13.3 g/dL (ref 12.0–15.0)
Hemoglobin: 13.4 g/dL (ref 12.0–15.0)
Immature Granulocytes: 1 %
Immature Granulocytes: 1 %
Lymphocytes Relative: 16 %
Lymphocytes Relative: 11 %
Lymphs Abs: 1 10*3/uL (ref 0.7–4.0)
Lymphs Abs: 0.9 10*3/uL (ref 0.7–4.0)
MCH: 31.3 pg (ref 26.0–34.0)
MCH: 31.7 pg (ref 26.0–34.0)
MCHC: 34 g/dL (ref 30.0–36.0)
MCHC: 34.5 g/dL (ref 30.0–36.0)
MCV: 92 fL (ref 80.0–100.0)
MCV: 91.7 fL (ref 80.0–100.0)
Monocytes Absolute: 0.5 10*3/uL (ref 0.1–1.0)
Monocytes Absolute: 0.5 10*3/uL (ref 0.1–1.0)
Monocytes Relative: 9 %
Monocytes Relative: 7 %
Neutro Abs: 4.5 10*3/uL (ref 1.7–7.7)
Neutro Abs: 6.1 10*3/uL (ref 1.7–7.7)
Neutrophils Relative %: 72 %
Neutrophils Relative %: 80 %
Platelets: 280 10*3/uL (ref 150–400)
Platelets: 264 10*3/uL (ref 150–400)
RBC: 4.25 MIL/uL (ref 3.87–5.11)
RBC: 4.23 MIL/uL (ref 3.87–5.11)
RDW: 13.2 % (ref 11.5–15.5)
RDW: 13.2 % (ref 11.5–15.5)
WBC: 6.2 10*3/uL (ref 4.0–10.5)
WBC: 7.6 10*3/uL (ref 4.0–10.5)
nRBC: 0 % (ref 0.0–0.2)
nRBC: 0 % (ref 0.0–0.2)

## 2020-08-24 LAB — COMPREHENSIVE METABOLIC PANEL
ALT: 12 U/L (ref 0–44)
ALT: 12 U/L (ref 0–44)
AST: 16 U/L (ref 15–41)
AST: 27 U/L (ref 15–41)
Albumin: 3.8 g/dL (ref 3.5–5.0)
Albumin: 3.9 g/dL (ref 3.5–5.0)
Alkaline Phosphatase: 95 U/L (ref 38–126)
Alkaline Phosphatase: 94 U/L (ref 38–126)
Anion gap: 12 (ref 5–15)
Anion gap: 13 (ref 5–15)
BUN: 9 mg/dL (ref 8–23)
BUN: 9 mg/dL (ref 8–23)
CO2: 26 mmol/L (ref 22–32)
CO2: 25 mmol/L (ref 22–32)
Calcium: 9 mg/dL (ref 8.9–10.3)
Calcium: 9.1 mg/dL (ref 8.9–10.3)
Chloride: 97 mmol/L — ABNORMAL LOW (ref 98–111)
Chloride: 97 mmol/L — ABNORMAL LOW (ref 98–111)
Creatinine, Ser: 0.72 mg/dL (ref 0.44–1.00)
Creatinine, Ser: 0.85 mg/dL (ref 0.44–1.00)
GFR, Estimated: >60 mL/min (ref >60)
GFR, Estimated: >60 mL/min (ref >60)
Glucose, Bld: 99 mg/dL (ref 70–99)
Glucose, Bld: 123 mg/dL — ABNORMAL HIGH (ref 70–99)
Potassium: 3.7 mmol/L (ref 3.5–5.1)
Potassium: 3.3 mmol/L — ABNORMAL LOW (ref 3.5–5.1)
Sodium: 135 mmol/L (ref 135–145)
Sodium: 135 mmol/L (ref 135–145)
Total Bilirubin: 0.2 mg/dL — ABNORMAL LOW (ref 0.3–1.2)
Total Bilirubin: 0.4 mg/dL (ref 0.3–1.2)
Total Protein: 7.2 g/dL (ref 6.5–8.1)
Total Protein: 7.1 g/dL (ref 6.5–8.1)

## 2020-08-24 LAB — BRAIN NATRIURETIC PEPTIDE: B Natriuretic Peptide: 29.8 pg/mL (ref 0.0–100.0)

## 2020-08-24 LAB — LACTIC ACID, PLASMA
Lactic Acid, Venous: 2.5 mmol/L (ref 0.5–1.9)
Lactic Acid, Venous: 1.5 mmol/L (ref 0.5–1.9)

## 2020-08-24 LAB — TROPONIN I (HIGH SENSITIVITY)
Troponin I (High Sensitivity): 5 ng/L (ref <18)
Troponin I (High Sensitivity): 6 ng/L (ref <18)

## 2020-08-24 LAB — FIBRIN DERIVATIVES D-DIMER (ARMC ONLY): Fibrin derivatives D-dimer (ARMC): 1318.41 ng/mL (FEU) — ABNORMAL HIGH (ref 0.00–499.00)

## 2020-08-24 MED ORDER — IPRATROPIUM BROMIDE 0.02 % IN SOLN
0.5000 mg | Freq: Once | RESPIRATORY_TRACT | Status: AC
  Administered 2020-08-24: 0.5 mg via RESPIRATORY_TRACT
  Filled 2020-08-24: qty 2.5

## 2020-08-24 MED ORDER — PREDNISONE 50 MG PO TABS: 50.0000 mg | ORAL_TABLET | Freq: Every day | ORAL | 0 refills | Status: AC

## 2020-08-24 MED ORDER — LACTATED RINGERS IV BOLUS: 1000.0000 mL | Freq: Once | INTRAVENOUS | Status: DC

## 2020-08-24 MED ORDER — ALBUTEROL SULFATE (2.5 MG/3ML) 0.083% IN NEBU
5.0000 mg | INHALATION_SOLUTION | Freq: Once | RESPIRATORY_TRACT | Status: AC
  Administered 2020-08-24: 5 mg via RESPIRATORY_TRACT
  Filled 2020-08-24: qty 6

## 2020-08-24 MED ORDER — PREDNISONE 20 MG PO TABS
50.0000 mg | ORAL_TABLET | Freq: Once | ORAL | Status: AC
  Administered 2020-08-24: 50 mg via ORAL
  Filled 2020-08-24: qty 2

## 2020-08-24 MED ORDER — IOHEXOL 350 MG/ML SOLN
75.0000 mL | Freq: Once | INTRAVENOUS | Status: AC | PRN
  Administered 2020-08-24: 75 mL via INTRAVENOUS

## 2020-08-24 NOTE — ED Triage Notes (Signed)
Pt to ED from [place] due to cough/SOB. Per report from [facility]. Pt had radiation yesterday. RA sats 94% on RA. Pt ambulatory to desk upon arrival to ED. Pt able to speak in full and complete sentences on arrival to ED. Pt visualized in NAD upon arrival to ED at this time.

## 2020-08-24 NOTE — ED Triage Notes (Addendum)
Patient sent by cancer doctor after appointment today for cough and sob. Reports having radiation treatment X1 month ago. Cough started 3 weeks ago and SOB started 2 weeks ago. Both have progressively gotten worse. Hx colon cancer that metastasized to liver and lung. and colostomy bag

## 2020-08-24 NOTE — Progress Notes (Signed)
[facility]  [address], [address] [place],  [address] Phone: [phone]  Fax: [phone]   Clinic Day:  08/24/2020  Referring physician: Loni Muse, MD  Chief Complaint: Tamara Hall is a 65 y.o. 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  HPI: The patient was last seen in the medical oncology clinic on 07/04/2020. At that time, she felt "fairly good." She denied any abdominal pain.  Exam revealed weakness in the LLQ of the abdominal wall. Calcium was 8.8. CEA was 40.9 on 06/13/2020.  Chest CT with contrast on 08/22/2020 revealed mild reduction in size of the left infrahilar peribronchovascular nodule (1.2 x 2.2 cm, previously 1.5 x 2.3 cm). There was patchy ground-glass opacity in the left upper lobe and lingula as well as the left lower lobe, favoring radiation pneumonitis given the patient's radiation therapy, atypical infection was a less likely differential diagnostic consideration. There was coronary, aortic arch, and branch vessel atherosclerotic vascular disease, old granulomatous disease, and ablation defect in the left hepatic lobe.   Labs on 08/07/2020 revealed a sodium of 133, chloride 96, BUN 7, calcium 8.4. Folate was 68.0. CEA was 7.7. She received a vitamin B12 injection.  During the interim, she has been "fair."  She has had a unrelenting non-productive cough and wheezing for about 2 weeks. She feels that her chest is "full of stuff and she can't get it to come up." This worsens when she lays down. When she coughs too hard, she gets dizzy. She denies fevers and hemoptysis. She has an albuterol inhaler but it does not help anymore. She does not have a pulmonologist.  Her bowels are "ok." She takes a spoonful of milk of magnesia every morning. Her heartburn has improved. Her hernia is not bothering her.  Her balance is good as long as she has her rolling walker.  She is not allergic to anything to her knowledge.   Past Medical History:  Diagnosis Date  . Cancer ([place])    colon, with mets to liver and left lung  . Hypertension   . Pneumonia    20+ years ago    Past Surgical History:  Procedure Laterality Date  . ABDOMINAL HYSTERECTOMY    . BREAST SURGERY    . COLON SURGERY  10/2017   [facility]  . COLONOSCOPY WITH PROPOFOL N/A 01/14/2017   Procedure: COLONOSCOPY WITH PROPOFOL;  Surgeon: Lucilla Lame, MD;  Location: [facility] ENDOSCOPY;  Service: Endoscopy;  Laterality: N/A;  . ELBOW SURGERY Right    Has had 4 surgeries on right elbow.  Marland Kitchen HAND SURGERY Right 2008  . JOINT REPLACEMENT    . LIVER BIOPSY  10/2017  . lung wedge resection  10/2017  . PORTA CATH Hall N/A 01/29/2017   Procedure: Tamara Hall;  Surgeon: Algernon Huxley, MD;  Location: [facility];  Service: Cardiovascular;  Laterality: N/A;  . TONSILLECTOMY      Family History  Problem Relation Age of Onset  . Lung cancer Mother 69  . Esophageal cancer Father 60  . Brain cancer Maternal Grandmother     Social History:  reports that she quit smoking about 2 years ago. Her smoking use included cigarettes. She has a 80.00 pack-year smoking history. She has never used smokeless tobacco. She reports that she does not drink alcohol and does not use drugs. She was  smoking 1/2 packs/day.She lives in [place]. She has 3 children (ages 69, 92, and 58). She smoked 2 packs a day. She started smoking at age 48. She initially stopped smoking on 10/24/2017. 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 Tamara Hall is her significant other. The patient is alone today.    Allergies: No Known Allergies  Current Medications: Current Outpatient Medications  Medication Sig Dispense Refill  . albuterol (VENTOLIN HFA) 108 (90 Base) MCG/ACT inhaler Inhale into the lungs.    Marland Kitchen amLODipine  (NORVASC) 5 MG tablet Take 5 mg by mouth daily.    . baclofen (LIORESAL) 10 MG tablet Take 10 mg by mouth 3 (three) times daily.    . Calcium Carbonate (CALCIUM 500 PO) Take 1 tablet by mouth daily.    . folic acid (FOLVITE) 1 MG tablet TAKE 1 TABLET BY MOUTH EVERY DAY 90 tablet 1  . gabapentin (NEURONTIN) 300 MG capsule Take 1,200 mg by mouth 3 (three) times daily.     . hydrochlorothiazide (HYDRODIURIL) 25 MG tablet Take 25 mg by mouth daily.    Marland Kitchen HYDROcodone-acetaminophen (NORCO/VICODIN) 5-325 MG tablet Take 1 tablet by mouth every 6 (six) hours as needed for moderate pain. 15 tablet 0  . LORazepam (ATIVAN) 0.5 MG tablet Take 1 tablet (0.5 mg total) by mouth every 6 (six) hours as needed (NAUSEA). 30 tablet 0  . nortriptyline (PAMELOR) 10 MG capsule Take 10 mg by mouth at bedtime.    Marland Kitchen omeprazole (PRILOSEC) 20 MG capsule TAKE 1 CAPSULE BY MOUTH EVERY DAY 30 capsule 1  . Ostomy Supplies (PREMIER DRAINABLE POUCH 64MM) Pouch MISC     . potassium chloride (KLOR-CON M10) 10 MEQ tablet TAKE 1 TABLET (10 MEQ TOTAL) BY MOUTH 5 (FIVE) TIMES DAILY. 120 tablet 0   No current facility-administered medications for this visit.    Review of Systems  Constitutional: Positive for weight loss (2 lbs). Negative for chills, diaphoresis, fever and malaise/fatigue.       Feels "fair."  HENT: Negative.  Negative for congestion, ear discharge, ear pain, hearing loss, nosebleeds, sinus pain, sore throat and tinnitus.   Eyes: Negative.  Negative for blurred vision.  Respiratory: Positive for cough and wheezing. Negative for hemoptysis, sputum production and shortness of breath.   Cardiovascular: Negative.  Negative for chest pain, palpitations and leg swelling.  Gastrointestinal: Positive for heartburn (takes medication every morning). Negative for abdominal pain, blood in stool, constipation (takes milk of magnesia daily), diarrhea, melena, nausea and vomiting.       S/p parastomal hernia repair on 03/22/2020.    Genitourinary: Negative.  Negative for dysuria, frequency and urgency.  Musculoskeletal: Positive for back pain. Negative for falls, joint pain, myalgias and neck pain.  Skin: Negative.  Negative for itching and rash.  Neurological: Positive for dizziness (when she coughs too hard). Negative for sensory change, speech change, weakness and headaches.  Endo/Heme/Allergies: Negative.  Negative for environmental allergies. Does not bruise/bleed easily.  Psychiatric/Behavioral: Negative.  Negative for depression and memory loss. The patient is not nervous/anxious and does not have insomnia.   All other systems reviewed and are negative.  Performance status (ECOG): 2  Vitals Blood pressure (!) 135/58, pulse (!) 102, temperature (!) 96.5 F (35.8 C), temperature source Tympanic, resp. rate 18, weight 185 lb 6.5 oz (84.1 kg), SpO2 94 %.   Physical Exam Vitals and nursing note reviewed.  Constitutional:      Appearance:  She is well-developed.     Interventions: Face mask in place.     Comments:    HENT:     Head: Normocephalic and atraumatic.     Comments: Long hair.    Mouth/Throat:     Mouth: Mucous membranes are moist. No oral lesions.     Pharynx: Oropharynx is clear.  Eyes:     General: No scleral icterus.    Conjunctiva/sclera: Conjunctivae normal.     Pupils: Pupils are equal, round, and reactive to light.     Comments: Blue eyes.  Neck:     Vascular: No JVD.  Cardiovascular:     Rate and Rhythm: Normal rate and regular rhythm.     Heart sounds: Normal heart sounds. No murmur heard.  No friction rub. No gallop.   Pulmonary:     Breath sounds: No wheezing.     Comments: Diffuse upper airway breath sounds heard across the room, transmitted throughout both lungs. Increased work of breathing.  Chest:     Chest wall: No tenderness.  Abdominal:     General: Bowel sounds are normal. There is no distension.     Palpations: Abdomen is soft. There is no mass.     Tenderness: There  is no abdominal tenderness. There is no guarding or rebound.  Musculoskeletal:        General: No swelling or tenderness. Normal range of motion.     Cervical back: Normal range of motion and neck supple.  Lymphadenopathy:     Head:     Right side of head: No preauricular, posterior auricular or occipital adenopathy.     Left side of head: No preauricular, posterior auricular or occipital adenopathy.     Cervical: No cervical adenopathy.     Upper Body:     Right upper body: No supraclavicular or axillary adenopathy.     Left upper body: No supraclavicular or axillary adenopathy.     Lower Body: No right inguinal adenopathy. No left inguinal adenopathy.  Skin:    General: Skin is warm.     Coloration: Skin is not pale.     Findings: No bruising, erythema, lesion or rash.  Neurological:     Mental Status: She is alert and oriented to person, place, and time.  Psychiatric:        Behavior: Behavior normal.        Thought Content: Thought content normal.        Judgment: Judgment normal.    Imaging studies: 01/13/2017:Abdomen and pelvic CTrevealed irregular thickening of the rectum suspicious for carcinoma. There was a 1.6 cm hypoattenuating lesion in the right hepatic lobe worrisome for metastatic disease. 01/15/2017:Chest CT revealed a 9 mm noncalcified left lower lobe pulmonary nodule (new). 01/16/2017:Pelvic MRIrevealed rectal adenocarcinoma T3N1. There was extension beyond the muscularis propria (17 mm). There were mesorectal nodes >= 5 mm. There was adenopathy on the left side of the mesorectum, including a 9 mm lesion. The distance from the tumor to the anal sphincter was 8 mm. 01/27/2017:PET scanrevealed intense metabolic activity associated rectal mass consists with primary rectal carcinoma. There was a hypermetabolic metastatic lesion to the central LEFT hepatic lobe. There was metabolic activity associated with small LEFT lower lobe nodule which was most  consistent with pulmonary metastasis. 01/31/2017:Liver MRIrevealed a 2.6 x 2.7 x 2.1 cm lesion in segment VIII of the liver near the junction with segment IVa c/w a metastatic lesion. There was a 1.1 x 0.8 cm suspected metastatic lesion peripherally  in segment VI of the liver.  05/16/2017:Abdomen and pelvic CTrevealed persistent but decreased asymmetric wall thickening in the rectum. The medial segment left liver lesion was smaller (2.7 x 2.6 cm to 1.9 x 1.6 cm). A tiny lesion seen in segment VI of the liver on previous MRI was not evident on today's CT scan. The left lower lobe pulmonary nodule was smaller (9 mm to 6 mm). There was no new or progressive findings in the abdomen and pelvis. 08/19/2017:Chest CTwith contrast on 08/19/2017 revealed left lower lobe 6 mm solid pulmonary nodule, decreased since 01/15/2017 chest CT. Stable 7 mm right lower lobe pulmonary nodule. There was no new or progressive metastatic disease in the chest.  09/03/2018: Pelvic MRI at [facility] demonstrated a partial response of the primary tumor and extramural disease. Post treatment category ymrT T3b. There was an overall decrease in size in the previously seen perirectal node. Hypointense changes at the site of prior tumor focally contacedt buts does not definitely invade the LEFT levator ani. Tumor contacted, but does not definitely invade the internal anal sphincter. No evidence of residual or recurrent hepatic disease following microwave ablation. 02/17/2018:Abdomen and pelvic CTrevealed evolution of the ablation zone in segment 4A, now measuring 3.7 x 3.1 cm, decreased.There was no findings to suggest viable tumor.She is s/pabdominoperineal resection with left lower quadrant colostomy. There was no evidence of new/progressive metastatic disease.She is s/pleft lower lobe pulmonary wedge resection, incompletely visualized. 03/04/2018:Chest CTrevealed a stable chest CT (compared to11/13/2018). The  index right lower lobe lung nodule measured7 mm (unchanged). There was no new or progressive nodularity identified. There was continued decrease in size of ablation defect within segment 4a of the liver. 08/13/2019:[facility] parastomal hernia with small bowel as well as multiple small bowel loops whichwere dilated.There was hepatic steatosis and post-treatment changes in the pelvis with persistent presacral soft tissue. There was a 3.5 cm infrarenal abdominal aortic aneurysm. 09/09/2019:Chest CT at [facility] no intrathoracic metastasis and no new or enlarging pulmonary nodules. 09/09/2019:Abdomen and pelvis CT at [facility] multiple subcentimeter hypodensities in the dome of the liver, which appeared new/more conspicuous from prior CT. There were not seen on the prior MRI. Further evaluation with MRI was recommended. CT evaluationwas limited due to background fatty liver and the heterogeneity/these lesions could have been secondary to fatty liver. There was unchanged soft tissue stranding and nodularity in the pelvis, with a few prominent more nodular areas. There were a few more prominent indeterminateareasand short-term follow up was recommended to ensure stability and exclude peritoneal disease. Further evaluation with MRI could have been helpful. 11/12/2019: Liver and pelvis MRIrevealedno findings to explain the patient's history of rising CEA. There was irregular presacral soft tissue with apparent scarring noted in the left paracentral pelviswas similar to prior studies and presumably treatment related. PET-CT could be used, as clinically warranted to exclude hypermetabolic disease within thatregion. There was an ablation defect identified in segment IV with minimal irregular enhancement along the anterosuperior margin, indeterminate. Attention on follow-up recommended.There was a tiny cyst identified in the dome of the right liver  without other findings to suggest metastatic disease in the hepatic parenchyma. 11/29/2019:PET scanrevealed 1.6 x 1.5 cmnodularthickeningat the resection margin (SUV 11.3)in the LEFT lower lobe. There was no evidence of hepatic metastasis. There was no evidence of local recurrence in pelvis. There was insufficiency fracture in the LEFT sacral ala presumably related to radiation treatment. 02/23/2020:  Chest, abdomen and pelvis CT revealed a stable 2.6 x 1.7 cm  partially calcified left infrahilar pulmonary nodule, hypermetabolic on PET scan and likely metastasis.  There were new no new or enlarging pulmonary nodules.  There were stable treated hepatic metastatic disease with no evidence of progression or local recurrence.  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  There is no evidence of incarceration or obstruction.  There was mild wall thickening of multiple loops of small bowel in the mid abdomen, possibly related to prior radiation therapy.  There was stable chronic sacral insufficiency fractures bilaterally.  There was a stable 3.7 cm fusiform infrarenal abdominal aortic aneurysm.  03/22/2020:  Abdomen and pelvis CT at [facility] revealed a high-grade small bowel obstruction. There were 2 separate loops of small bowel contained within the parastomal hernia. The first was more proximal small bowel which appeared dilated but nonobstructed. The second segment of included small bowel was more distal small bowel which appeared dilated entering into the herniaand collapsed exiting the hernia, consistent with point of obstruction.  There was a 3.7 cm infrarenal abdominal aortic aneurysm with extensive mural thrombus, similar in size compared with previous examination, although degree of mural thrombus had increased. Additional calcified atherosclerotic plaque was noted throughout the abdominal aorta and its branches.  08/22/2020:  Chest CT with contrast revealed  mild reduction in size of the left infrahilar peribronchovascular nodule (1.2 x 2.2 cm, previously 1.5 x 2.3 cm. There was patchy ground-glass opacity in the left upper lobe and lingula as well as the left lower lobe, favoring radiation pneumonitis given the patient's radiation therapy, atypical infection was a less likely differential diagnostic consideration. There was coronary, aortic arch, and branch vessel atherosclerotic vascular disease, old granulomatous disease, and ablation defect in the left hepatic lobe.   Appointment on 08/24/2020  Component Date Value Ref Range Status  . WBC 08/24/2020 6.2  4.0 - 10.5 K/uL Final  . RBC 08/24/2020 4.25  3.87 - 5.11 MIL/uL Final  . Hemoglobin 08/24/2020 13.3  12.0 - 15.0 g/dL Final  . HCT 08/24/2020 39.1  36 - 46 % Final  . MCV 08/24/2020 92.0  80.0 - 100.0 fL Final  . MCH 08/24/2020 31.3  26.0 - 34.0 pg Final  . MCHC 08/24/2020 34.0  30.0 - 36.0 g/dL Final  . RDW 08/24/2020 13.2  11.5 - 15.5 % Final  . Platelets 08/24/2020 280  150 - 400 K/uL Final  . nRBC 08/24/2020 0.0  0.0 - 0.2 % Final  . Neutrophils Relative % 08/24/2020 72  % Final  . Neutro Abs 08/24/2020 4.5  1.7 - 7.7 K/uL Final  . Lymphocytes Relative 08/24/2020 16  % Final  . Lymphs Abs 08/24/2020 1.0  0.7 - 4.0 K/uL Final  . Monocytes Relative 08/24/2020 9  % Final  . Monocytes Absolute 08/24/2020 0.5  0.1 - 1.0 K/uL Final  . Eosinophils Relative 08/24/2020 1  % Final  . Eosinophils Absolute 08/24/2020 0.1  0.0 - 0.5 K/uL Final  . Basophils Relative 08/24/2020 1  % Final  . Basophils Absolute 08/24/2020 0.0  0.0 - 0.1 K/uL Final  . Immature Granulocytes 08/24/2020 1  % Final  . Abs Immature Granulocytes 08/24/2020 0.04  0.00 - 0.07 K/uL Final   Performed at [facility], [address], [place],  [address]    Assessment:  WYONIA FONTANELLA is a 64 y.o. female with clinical stage T3N1M1rectal cancer. She presented with a 9 month history of rectal bleeding.  Colonoscopyon 01/14/2017 revealed afrond-like/villous non-obstructing  large mass in the rectum. The mass was non-circumferential. Biopsies revealed high grade dysplasia within an adenoma (? sampling error). CEAwas 31.6 on 01/15/2017.  PET scanon 01/27/2017 revealed intense metabolic activity associated rectal mass consists with primary rectal carcinoma. There was a hypermetabolic metastatic lesion to the central LEFT hepatic lobe. There was metabolic activity associated with small LEFT lower lobe nodule which was most consistent with pulmonary metastasis.  CT guided liver biopsyat [facility] on 02/07/2017 revealed malignant cells c/w metastatic colorectal adenocarcinoma. [facility] on 12/29/2019 [phone]) revealed MS-stable, 1 Muts/Mb tumor mutational burden; KRAS G12C, PTEN rearrangement intron 8, APC G1106*, T1480f*18, and NRAS wildtype.  There were no reportable mutations in BRAF and NRAS.     She received 2 cycles of FOLFOX (02/18/2017 - 03/04/2017). She developed transient cold induced neuropathy. She developed wheezing and hypoxia with cycle #2 felt secondary to oxaliplatin. She received 4 cycles ofFOLFIRI(03/24/2017 - 04/22/2017) with Neulasta support. Diarrhea was controlled with Imodium.  She underwent CT guided microwave ablation of the liver lesionon 06/06/2017.   She began radiationon 06/18/2017. She receiveddaily XMelody Haverradiation. Radiation and Xeloda completed on 08/15/2017.  She underwentAPR with descending colostomy and VRAM flapon 10/24/2017. She underwent a VATS wedge resectionto her LEFT lung. Pathology from the lung demonstrateda 1 cmmetastatic intestinal type adenocarcinoma c/w metastasis from patient's known colorectal carcinoma.Pathology from the rectal resection revealed a 2.1 cm invasive moderately differentiated adenocarcinoma arising in a tubular adenoma with transmural invasion focally into the perirectal soft tissue. There was  no lymphovascular or perineural invasion. Margins were negative. Zero of 20 lymph nodes were positive. Final diagnosiswas [id number] adenocarcinoma with metastasis to left lower lobe of the lung.  She has had post-operative pain and ostomy ischemia. She underwent surgical ostomy revisionon 11/28/2017. Pathologyshowed focal mucosal erosion and fat necrosis c/w ischemia. There was extensive underlying fat necrosis. No dysplasia or carcinoma was identified. She was treated with oral Augmentin and wound packing. She has been treated with oxycodone 5 mg po q 4 hrs prn, gabapentin 900 mg TID. She was treated with Bactrim on 01/18/2018. She was referred to local pain management clinic on 01/27/2018.  PET scanon 11/29/2019 showed local recurrenceof hypermetabolic nodular lung malignancy at the resection margin(1.6 x 1.5 cm; SUV 11.3)in the LEFT lower lobe. There was no evidence of hepatic metastasis. There was no evidence of local recurrence in pelvis. There was insufficiency fracture in the LEFT sacral ala presumably related to radiation treatment.  Chest CTon 12/13/2019 revealed anenlarging partially calcified2.3 x 1.2 cmlesion in the medial aspect of the left lower lobe most compatible with a pulmonary metastasis given the hypermetabolism on recent PET-CT.There were several other tiny pulmonary nodules measuring 6 mm or less in size, some of whichwere new or increased in size compared to prior examinations.  She declined surgical resection.  Chest, abdomen and pelvis CT on 02/23/2020 revealed a stable 2.6 x 1.7 cm partially calcified left infrahilar pulmonary nodule, hypermetabolic on PET scan and likely metastasis.  There were new no new or enlarging pulmonary nodules.  There were stable treated hepatic metastatic disease with no evidence of progression or local recurrence.  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  There is  no evidence of incarceration or obstruction.  There was mild wall thickening of multiple loops of small bowel in the mid abdomen, possibly related to prior radiation therapy.  There was stable chronic sacral insufficiency fractures bilaterally.  There was a stable 3.7 cm fusiform  infrarenal abdominal aortic aneurysm.   She is s/p 1 cycle of FOLFIRI (03/08/2020) with Fulphila support.  Cycle #1 was notable for nausea, vomiting, and diarrhea.  She declined further chemotherapy.  She received IMRT (6000 cGy) to the left lung lesion from 05/10/2020 - 05/22/2020.   Chest CT with contrast on 08/22/2020 revealed mild reduction in size of the left infrahilar peribronchovascular nodule (1.2 x 2.2 cm, previously 1.5 x 2.3 cm). There was patchy ground-glass opacity in the left upper lobe and lingula as well as the left lower lobe, favoring radiation pneumonitis.  CEAhas been followed: 31.6 on 01/15/2017, 38.1 on 02/18/2017, 25.1 on 03/18/2017, 11.8 on 04/08/2017, 9.2 on 04/22/2017, 9.5 on 05/20/2017, 9.6 on 06/19/2017, 3.7 on 02/18/2018,5.8 on 06/26/2018, 10.5 on 11/20/2018, 19.5 on 03/26/2019, 34.0 on 07/21/2019, 46.4 on 09/09/2019, 54.4 on 10/28/2019, 108.0 on 02/10/2020, 121.0 on 03/08/2020, 49.8 on 04/13/2020, 76.7 on 05/29/2020, 40.9 on 06/13/2020, and 7.7 on 08/07/2020.  Colonoscopy at [facility] 09/13/2019 did not appear malignant with no adenomatous (granulation tissue). There was diverticulosis in the descending colon, normal mucosa in the entire examined colon and the examined portion of the ileum. There was a 9 mmlipoma in the transverse colon anda4 mm polyp at the surgical stoma.   She has a B12 deficiency.[id number] (low)on 12/03/2019. She restarted B12 injections on 12/13/2019 (last 08/07/2020).  She has folate deficiency.  Folate was 5.8 (low) on 12/03/2019 and 68.0 on 08/07/2020.  She is on folic acid.    She has chronic hypokalemia.  She is on potassium 60 meq po q day.  [facility] a smallbowel obstruction. She had severe abdominal pain with associated nausea and vomiting. Abdominal and pelvicCT revealedmultiple small bowel loops whichwere dilated.Obstruction resolved spontaneously.  She was admitted to [facility] from 03/22/2020 - 03/28/2020. She presented with an incarcerated hernia and underwent parastomal hernia repair with no bowel resection on 03/22/2020.  There was a 3.7 cm infrarenal abdominal aortic aneurysm with extensive mural thrombus, similar in size compared with previous examination, although degree of mural thrombus had increased.  She does not plan to have the COVID-19 vaccine.   Symptomatically, she has had a unrelenting non-productive cough and wheezing for 2 weeks. She feels that her chest is "full of stuff and she can't get it to come up."  Symptoms worsens when she lays down. When she coughs too hard, she gets dizzy. She denies fever or hemoptysis.  Exam reveals audible wheezing and increased work of breathing.   Plan: 1.   Labs today: CBC with diff, CMP. 2.Stage IV rectal carcinoma Shereceived6cycles of FOLFOX(02/18/2017 -04/22/2017).  She underwent CT guided microwave ablation of the liver lesionon 06/06/2017.  She receivedradiationand daily Xeloda (06/18/2017-08/15/2017). She underwentAPR with descending colostomy and VRAM flapon 10/24/2017.  She underwent a VATS wedge resection of her left lung. 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  She declined left lower lobe lobectomy.  She received 1 cycle of FOLFIRI (03/08/2020).                She declined further chemotherapy or surgery.  She completed SBRT to the left infrahilar nodule on 05/22/2020.  CEA was 40.9 on 06/13/2020 and 7.7 on 08/07/2020.   Chest CT on 08/22/2020 was personally reviewed.   Agree with radiology interpretation.   Left infrahilar nodule is slightly smaller.  Symptomatically, she has a 2 week history of cough and wheezing.   She may have radiation pneumonitis.   She may  have an atypical infection. 3. Shortness of breath  Patient with audible wheezing.  Imaging suggestive of radiation pneumonitis vs an atypical infection.  Symptoms have not improved with albuterol.  Discuss referral to the ER.  Anticipate COVID-19 testing, intiation of steroids and an empiric course of antibiotics.  Dr Patsey Berthold and Dr Baruch Gouty contacted. 4.   B12 and folate deficiency [id number] (low)on 12/03/2019 and folate 5.8 (low) on 12/03/2019. She receives B12 monthly (last 08/07/2020).             Continuee B12 monthly.            5.Hypokalemia Potassium3.3 today. She is on potassium 60 mEq a day.  Encourage follow-up with Dr Gordy Levan regarding likely HCTZ associated hypokalemia 6.   Patient to ER. 7.   RTC based on disposition in ER.  I discussed the assessment and treatment plan with the patient.  The patient was provided an opportunity to ask questions and all were answered.  The patient agreed with the plan and demonstrated an understanding of the instructions.  The patient was advised to call back if the symptoms worsen or if the condition fails to improve as anticipated.   I provided 24 minutes of face-to-face time during this this encounter and > 50% was spent counseling as documented under my assessment and plan.  An additional 10-12 minutes were spent reviewing her chart (Epic and Care Everywhere) including notes, labs, and imaging studies.   The ER was contacted.   Lequita Asal, MD, PhD    08/24/2020, 2:13 PM  I, Mirian Mo Tufford, am acting as a Education administrator for [employer]. Mike Gip, MD.   I, Zerick Prevette C. Mike Gip, MD, have reviewed the above documentation for accuracy and completeness, and I agree with the above.

## 2020-08-24 NOTE — Discharge Instructions (Signed)
Take your prednisone daily starting tomorrow , 11/19, Friday afternoon. Take one daily for the next 4 days.   Follow up with your oncologist and return to the ED with any worsening symptoms.

## 2020-08-24 NOTE — ED Notes (Signed)
Date and time results received: 08/24/20 1701 (use smartphrase ".now" to insert current time)  Test: Lactic Critical Value: 2.5  Name of Provider Notified: Charlsie Quest, MD  Orders Received? Or Actions Taken?: Critical Results Acknowledged

## 2020-08-24 NOTE — ED Provider Notes (Signed)
[facility] Emergency Department Provider Note ____________________________________________   First MD Initiated Contact with Patient 08/24/20 1806     (approximate)  I have reviewed the triage vital signs and the nursing notes.  HISTORY  Chief Complaint Shortness of Breath and Cough   HPI Tamara Hall is a 64 y.o. femalewho presents to the ED for evaluation of cough and shortness of breath.  Chart review indicates history of colon cancer with metastases to the liver and left lung.  Stage IV rectal cancer s/p microwave hepatic ablations, colostomy bag, VATS wedge resection Regularly scheduled outpatient CT chest with venous contrast performed 2 days ago with reducing cancers nodules, some evidence of radiation pneumonitis without infiltrate. Otherwise history of HTN, GERD  Patient reports her last radiation treatment was about 1 month ago. She reports following up with her oncologist today to follow-up on the results of this recent CT scan, telling her oncologist about increasing shortness of breath advised to come to ED for evaluation.  Patient reports about 2 weeks of increased shortness of breath from baseline with associated nonproductive cough and chest discomfort.  She minimizes the chest discomfort, indicates that she really only feels sore after coughing a lot.  She denies any mucus or a productive cough.  She denies chest pain outside of her coughing.  She reports that she typically will get short of breath with ambulation, but now gets short of breath with conversation.  Denies shortness of breath at rest.  Denies pleuritic pains, syncope, headache, trauma, emesis or abdominal pain.   Past Medical History:  Diagnosis Date  . Cancer ([place])    colon, with mets to liver and left lung  . Hypertension   . Pneumonia    20+ years ago    Patient Active Problem List   Diagnosis Date Noted  . Gastroesophageal reflux disease with esophagitis 06/04/2020    . Folate deficiency 12/04/2019  . B12 deficiency 11/15/2019  . H/O small bowel obstruction 10/29/2019  . Pain in pelvis 10/28/2019  . SBO (small bowel obstruction) ([place]) 08/13/2019  . Diarrhea 07/11/2017  . Hypokalemia 07/08/2017  . Malignant neoplasm metastatic to left lung ([place]) 05/27/2017  . Nausea without vomiting 05/06/2017  . Liver metastases ([place]) 02/25/2017  . Goals of care, counseling/discussion 02/18/2017  . Encounter for antineoplastic chemotherapy 02/18/2017  . Cancer related pain 02/16/2017  . Tobacco use 02/06/2017  . Abnormal findings-gastrointestinal tract   . Rectum neoplasm   . Rectal bleeding 01/13/2017  . Rectal cancer [facility])     Past Surgical History:  Procedure Laterality Date  . ABDOMINAL HYSTERECTOMY    . BREAST SURGERY    . COLON SURGERY  10/2017   [facility]  . COLONOSCOPY WITH PROPOFOL N/A 01/14/2017   Procedure: COLONOSCOPY WITH PROPOFOL;  Surgeon: Lucilla Lame, MD;  Location: [facility] ENDOSCOPY;  Service: Endoscopy;  Laterality: N/A;  . ELBOW SURGERY Right    Has had 4 surgeries on right elbow.  Marland Kitchen HAND SURGERY Right 2008  . JOINT REPLACEMENT    . LIVER BIOPSY  10/2017  . lung wedge resection  10/2017  . PORTA CATH INSERTION N/A 01/29/2017   Procedure: Glori Luis Cath Insertion;  Surgeon: Algernon Huxley, MD;  Location: [place] [facility];  Service: Cardiovascular;  Laterality: N/A;  . TONSILLECTOMY      Prior to Admission medications   Medication Sig Start Date End Date Taking? Authorizing Provider  albuterol (VENTOLIN HFA) 108 (90 Base) MCG/ACT inhaler Inhale into the lungs. 01/22/19  [provider]  amLODipine (NORVASC) 5 MG tablet Take 5 mg by mouth daily. 08/02/19   [provider]  baclofen (LIORESAL) 10 MG tablet Take 10 mg by mouth 3 (three) times daily.    [provider]  Calcium Carbonate (CALCIUM 500 PO) Take 1 tablet by mouth daily.    [provider]  folic acid (FOLVITE) 1 MG tablet TAKE 1 TABLET BY MOUTH EVERY  DAY 07/18/20   Lequita Asal, MD  gabapentin (NEURONTIN) 300 MG capsule Take 1,200 mg by mouth 3 (three) times daily.  05/14/19 08/23/20  [provider]  hydrochlorothiazide (HYDRODIURIL) 25 MG tablet Take 25 mg by mouth daily. 06/16/19 08/23/20  [provider]  HYDROcodone-acetaminophen (NORCO/VICODIN) 5-325 MG tablet Take 1 tablet by mouth every 6 (six) hours as needed for moderate pain. 05/10/18   Johnn Hai, PA-C  LORazepam (ATIVAN) 0.5 MG tablet Take 1 tablet (0.5 mg total) by mouth every 6 (six) hours as needed (NAUSEA). 03/08/20   Lequita Asal, MD  nortriptyline (PAMELOR) 10 MG capsule Take 10 mg by mouth at bedtime. 07/16/19   [provider]  omeprazole (PRILOSEC) 20 MG capsule TAKE 1 CAPSULE BY MOUTH EVERY DAY 08/13/20   Lequita Asal, MD  Ostomy Supplies (PREMIER DRAINABLE POUCH [phone]) Pouch MISC  12/20/19   [provider]  potassium chloride (KLOR-CON M10) 10 MEQ tablet TAKE 1 TABLET (10 MEQ TOTAL) BY MOUTH 5 (FIVE) TIMES DAILY. 08/01/20   Lequita Asal, MD  predniSONE (DELTASONE) 50 MG tablet Take 1 tablet (50 mg total) by mouth daily for 4 days. 08/24/20 08/28/20  Vladimir Crofts, MD    Allergies Patient has no known allergies.  Family History  Problem Relation Age of Onset  . Lung cancer Mother 54  . Esophageal cancer Father 74  . Brain cancer Maternal Grandmother     Social History Social History   Tobacco Use  . Smoking status: Current Every Day Smoker    Packs/day: 2.00    Years: 40.00    Pack years: 80.00    Types: Cigarettes  . Smokeless tobacco: Never Used  Vaping Use  . Vaping Use: Never used  Substance Use Topics  . Alcohol use: No  . Drug use: No    Review of Systems  Constitutional: No fever/chills Eyes: No visual changes. ENT: No sore throat. Cardiovascular: Positive for chest discomfort in the setting of cough Respiratory: Positive for cough and shortness of breath. Gastrointestinal: No  abdominal pain.  No nausea, no vomiting.  No diarrhea.  No constipation. Genitourinary: Negative for dysuria. Musculoskeletal: Negative for back pain. Skin: Negative for rash. Neurological: Negative for headaches, focal weakness or numbness.  ____________________________________________   PHYSICAL EXAM:  VITAL SIGNS: Vitals:   08/24/20 1611 08/24/20 1941  BP: (!) 132/55 (!) 151/62  Pulse: 93 92  Resp: (!) 22 (!) 21  Temp: 99.2 F (37.3 C)   SpO2: 94% 96%     Constitutional: Alert and oriented. Well appearing and in no acute distress. Eyes: Conjunctivae are normal. PERRL. EOMI. Head: Atraumatic. Nose: No congestion/rhinnorhea. Mouth/Throat: Mucous membranes are moist.  Oropharynx non-erythematous. Neck: No stridor. No cervical spine tenderness to palpation. Cardiovascular: Normal rate, regular rhythm. Grossly normal heart sounds.  Good peripheral circulation. Respiratory: Mild conversational dyspnea.  Respiratory rates in the low-mid 20s.  Moderate air movement throughout with diffuse expiratory wheezes. Gastrointestinal: Soft , nondistended, nontender to palpation. No CVA tenderness. Musculoskeletal: No lower extremity tenderness nor edema.  No  joint effusions. No signs of acute trauma. Neurologic:  Normal speech and language. No gross focal neurologic deficits are appreciated. No gait instability noted. Skin:  Skin is warm, dry and intact. No rash noted. Psychiatric: Mood and affect are normal. Speech and behavior are normal.  ____________________________________________   LABS (all labs ordered are listed, but only abnormal results are displayed)  Labs Reviewed  COMPREHENSIVE METABOLIC PANEL - Abnormal; Notable for the following components:      Result Value   Potassium 3.3 (*)    Chloride 97 (*)    Glucose, Bld 123 (*)    All other components within normal limits  LACTIC ACID, PLASMA - Abnormal; Notable for the following components:   Lactic Acid, Venous 2.5 (*)     All other components within normal limits  FIBRIN DERIVATIVES D-DIMER ([facility] ONLY) - Abnormal; Notable for the following components:   Fibrin derivatives D-dimer ([facility]) 1,318.41 (*)    All other components within normal limits  BRAIN NATRIURETIC PEPTIDE  LACTIC ACID, PLASMA  CBC WITH DIFFERENTIAL/PLATELET  TROPONIN I (HIGH SENSITIVITY)  TROPONIN I (HIGH SENSITIVITY)   ____________________________________________  12 Lead EKG  Sinus rhythm, rate of 97 bpm.  Normal axis.  Normal intervals.  No evidence of acute ischemia. ____________________________________________  RADIOLOGY  ED MD interpretation: 2 view CXR reviewed by me with patchy consolidation of the left peripheral lung field  Official radiology report(s): DG Chest 2 View  Result Date: 08/24/2020 CLINICAL DATA:  Shortness of breath productive cough, wheezing for 2 weeks EXAM: CHEST - 2 VIEW COMPARISON:  June 26, 2009 CT of the chest from August 22, 2020 FINDINGS: RIGHT-sided Port-A-Cath terminates in upper RIGHT atrium similar to recent CT evaluation. Cardiomediastinal contours and hilar structures are stable. Patchy areas of airspace disease in the LEFT chest are similar to recent CT evaluation accounting for differences in technique. No lobar level consolidation. No sign of pleural effusion. On limited assessment no acute skeletal process. IMPRESSION: 1. Patchy areas of airspace disease in the LEFT chest similar to recent CT evaluation accounting for differences in technique. While some of this may represent post treatment related changes superimposed pneumonitis is considered, attention on follow-up chest CT and correlation with current symptoms may be helpful. 2. No lobar consolidation or pleural effusion. Electronically Signed   By: Zetta Bills M.D.   On: 08/24/2020 17:25   CT Angio Chest PE W and/or Wo Contrast  Result Date: 08/24/2020 CLINICAL DATA:  64 year old female with concern for pulmonary embolism. EXAM:  CT ANGIOGRAPHY CHEST WITH CONTRAST TECHNIQUE: Multidetector CT imaging of the chest was performed using the standard protocol during bolus administration of intravenous contrast. Multiplanar CT image reconstructions and MIPs were obtained to evaluate the vascular anatomy. CONTRAST:  11mL OMNIPAQUE IOHEXOL 350 MG/ML SOLN COMPARISON:  Chest CT dated 08/22/2020 FINDINGS: Cardiovascular: There is no cardiomegaly or pericardial effusion. There is coronary vascular calcification. Mild atherosclerotic calcification of the thoracic aorta. No aneurysmal dilatation or dissection. The origins of the great vessels of the aortic arch appear patent as visualized. No pulmonary artery embolus identified. A central venous line is partially visualized with tip close to the cavoatrial junction. Mediastinum/Nodes: Calcified hilar and mediastinal granuloma. The esophagus and the thyroid gland are grossly unremarkable. No mediastinal fluid collection. Lungs/Pleura: Clusters of ground-glass density primarily in the lingula as well as in the left lower lobe and left infrahilar region again noted, similar or slightly progressed since the prior CT. There is no pleural effusion or pneumothorax. There is  mucous impaction of the left lower lobe bronchi with peribronchial thickening similar to prior CT. Upper Abdomen: Scattered calcified hepatic and splenic granuloma. Indeterminate 2.5 cm hypodense lesion in the dome of the liver as seen previously. Musculoskeletal: No chest wall abnormality. No acute or significant osseous findings. Review of the MIP images confirms the above findings. IMPRESSION: 1. No CT evidence of pulmonary embolism. 2. Clusters of ground-glass density primarily in the lingula as well as in the left lower lobe and left infrahilar region again noted, similar or minimally progressed since the prior CT. 3. Mucous impaction of the left lower lobe bronchi with peribronchial thickening similar to prior CT. 4. Aortic  Atherosclerosis (ICD10-I70.0). Electronically Signed   By: Anner Crete M.D.   On: 08/24/2020 20:16    ____________________________________________   PROCEDURES and INTERVENTIONS  Procedure(s) performed (including Critical Care):  .1-3 Lead EKG Interpretation Performed by: Vladimir Crofts, MD Authorized by: Vladimir Crofts, MD     Interpretation: normal     ECG rate:  94   ECG rate assessment: normal     Rhythm: sinus rhythm     Ectopy: none     Conduction: normal      Medications  lactated ringers bolus 1,000 mL (has no administration in time range)  albuterol (PROVENTIL) (2.5 MG/3ML) 0.083% nebulizer solution 5 mg (has no administration in time range)  ipratropium (ATROVENT) nebulizer solution 0.5 mg (has no administration in time range)  predniSONE (DELTASONE) tablet 50 mg (has no administration in time range)  iohexol (OMNIPAQUE) 350 MG/ML injection 75 mL (75 mLs Intravenous Contrast Given 08/24/20 1955)    ____________________________________________   MDM / ED COURSE   64 year old woman currently undergoing radiation for rectal cancer that has metastasized to her lungs, presents with acute on chronic shortness of breath and cough, 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  Patient tachycardic in triage but self resolves, and vitals are otherwise normal on room air.  Exam demonstrates some mild conversational dyspnea, but she overall looks well and is in no distress.  She has slight decrease in air movement and is wheezing throughout all lung fields.  This resolves after a breathing treatment and prednisone.  Due to her history of malignancy she is at risk for VTE, D-dimer was sent and returns positive suggestive of possible PE.  CTA chest obtained and does not show evidence of such.  Redemonstrating an area of likely radiation pneumonitis to her left lingular area.  Start the patient on a prednisone burst for the next 4 days to treat her  wheezing.  Advised following up with her oncologist and PCP.  We discussed return precautions for the ED.  Patient medically stable for discharge home.   Clinical Course as of Aug 25 2035  Thu Aug 24, 2020  2033 Reassessed.  Patient reports improving symptoms and she has improved airflow on auscultation with improvement of her wheezing.  We discussed prednisone prescription and we discussed following up with her oncologist.  We discussed return precautions for the ED.   [DS]    Clinical Course User Index [DS] Vladimir Crofts, MD    ____________________________________________   FINAL CLINICAL IMPRESSION(S) / ED DIAGNOSES  Final diagnoses:  Shortness of breath  Cough  Wheezing     ED Discharge Orders         Ordered    predniSONE (DELTASONE) 50 MG tablet  Daily        08/24/20 2032  Vladimir Crofts   Note:  This document was prepared using Systems analyst and may include unintentional dictation errors.   Vladimir Crofts, MD 08/24/20 2038

## 2020-08-25 ENCOUNTER — Encounter: Payer: Self-pay | Admitting: Radiation Oncology

## 2020-08-28 ENCOUNTER — Inpatient Hospital Stay: Payer: Medicare HMO | Attending: Hematology and Oncology

## 2020-08-28 ENCOUNTER — Encounter: Payer: Self-pay | Admitting: Radiation Oncology

## 2020-08-28 ENCOUNTER — Other Ambulatory Visit: Payer: Self-pay | Admitting: Hematology and Oncology

## 2020-08-28 ENCOUNTER — Ambulatory Visit
Admission: RE | Admit: 2020-08-28 | Discharge: 2020-08-28 | Disposition: A | Discharge status: Home / Self Care | Payer: Medicare HMO | Source: Ambulatory Visit | Attending: Radiation Oncology | Admitting: Radiation Oncology

## 2020-08-28 VITALS — BP 151/76 | HR 104 | Temp 97.1°F | Wt 186.4 lb

## 2020-08-28 DIAGNOSIS — C7800 Secondary malignant neoplasm of unspecified lung: Secondary | ICD-10-CM

## 2020-08-28 DIAGNOSIS — C20 Malignant neoplasm of rectum: Secondary | ICD-10-CM | POA: Diagnosis not present

## 2020-08-28 DIAGNOSIS — Z923 Personal history of irradiation: Secondary | ICD-10-CM | POA: Insufficient documentation

## 2020-08-28 DIAGNOSIS — R918 Other nonspecific abnormal finding of lung field: Secondary | ICD-10-CM | POA: Diagnosis not present

## 2020-08-28 DIAGNOSIS — E876 Hypokalemia: Secondary | ICD-10-CM

## 2020-08-28 DIAGNOSIS — C7802 Secondary malignant neoplasm of left lung: Secondary | ICD-10-CM | POA: Diagnosis not present

## 2020-08-28 DIAGNOSIS — E538 Deficiency of other specified B group vitamins: Secondary | ICD-10-CM | POA: Insufficient documentation

## 2020-08-28 MED ORDER — CYANOCOBALAMIN 1000 MCG/ML IJ SOLN
1000.0000 ug | Freq: Once | INTRAMUSCULAR | Status: AC
  Administered 2020-08-28: 1000 ug via INTRAMUSCULAR
  Filled 2020-08-28: qty 1

## 2020-08-28 MED ORDER — POTASSIUM CHLORIDE CRYS ER 10 MEQ PO TBCR: EXTENDED_RELEASE_TABLET | ORAL | 0 refills | Status: DC

## 2020-08-28 NOTE — Progress Notes (Signed)
Radiation Oncology Follow up Note  Name: Tamara Hall   Date:   08/28/2020 MRN:  [id number] DOB: 01/29/1956    This 64 y.o. female presents to the clinic today for 10-month follow-up status post palliative radiation therapy to her left lung and patient with known stage IV colorectal cancer.  REFERRING PROVIDER: Loni Muse, MD  HPI: Patient is a 64 year old female now out 3 months having completed palliative radiation therapy to her left lung for lung involvement of stage IV colorectal cancer. She was recently in the emergency room where CT scan. Showed clusters of groundglass density primarily in the lingula as well as left lower lobe and left hilar region. She has been started on steroids and antibiotic therapy somewhat slightly improved. Patient has not been vaccinated and has not been tested for Covid  COMPLICATIONS OF TREATMENT: none  FOLLOW UP COMPLIANCE: keeps appointments   PHYSICAL EXAM:  BP (!) 151/76 (BP Location: Left Arm, Patient Position: Sitting, Cuff Size: Normal)   Pulse (!) 104   Temp (!) 97.1 F (36.2 C) (Tympanic)   Wt 186 lb 6.4 oz (84.6 kg)   SpO2 97%   BMI 29.19 kg/m  Well-developed well-nourished patient in NAD. HEENT reveals PERLA, EOMI, discs not visualized.  Oral cavity is clear. No oral mucosal lesions are identified. Neck is clear without evidence of cervical or supraclavicular adenopathy. Lungs are clear to A&P. Cardiac examination is essentially unremarkable with regular rate and rhythm without murmur rub or thrill. Abdomen is benign with no organomegaly or masses noted. Motor sensory and DTR levels are equal and symmetric in the upper and lower extremities. Cranial nerves II through XII are grossly intact. Proprioception is intact. No peripheral adenopathy or edema is identified. No motor or sensory levels are noted. Crude visual fields are within normal range.  RADIOLOGY RESULTS: CT scan reviewed compatible with above-stated findings. Majority of her  findings are outside of her SBRT treatment field making this less likely to be radiation induced.  PLAN: Present time I don't believe this is radiation pneumonitis. We're having her tested for Covid. She also may have some symptoms of pneumonitis unrelated to her prior radiation. I certainly agree with antibiotic therapy as well as steroid treatment. I have asked to see her back in 6 months for follow-up. She continues close follow-up care with Dr. Mike Gip. She was sent for a Covid test.  I would like to take this opportunity to thank you for allowing me to participate in the care of your patient.Noreene Filbert, MD

## 2020-08-29 NOTE — Progress Notes (Signed)
[facility]  [address], [address] [place], [place] [address] Phone: [phone]  Fax: [phone]   Clinic Day:  08/30/2020  Referring physician: Loni Muse, MD  Chief Complaint: Tamara Hall is a 64 y.o. 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  HPI: The patient was last seen in the medical oncology clinic on 08/24/2020. At that time, she noted significant shortness of breath.  Her inhaler was not working.  She noted a cough x 3 weeks.  She denied fever.  Exam revealed diffuse coarse breath sounds throughout with some wheezing.  Hematocrit was 39.1, hemoglobin 13.3, platelets 280,000, WBC 6,200.  Potassium was 3.7.  She was felt to possibly have radiation pneumonitis +/- an atypical infection.  The patient was taken to the ER for further evaluation.  The patient presented to the [facility] ER on 08/24/2020 with cough and shortness of breath. She had slight decrease in air movement and wheezing throughout all lung fields. Exam improved after nebulized albuterol, Atrovent and oral prednisone 50 mg.  D Dimers were elevated at 1,318.41. Chest CT angiogram was negative for PE. She was started on a prednisone burst x 4 days.  The patient saw Dr. Baruch Gouty on 08/28/2020. It was felt that her symptoms and CT results were not due to radiation pneumonitis because the findings were outside of her SBRT treatment field.  During the interim, she has been "ok". She received the first dose of the COVID-19 vaccine on 08/28/2020. She has painful spots in her leg, head, neck, and breast due to the vaccine.   The patient still has a cough and shortness of breath but these symptoms have improved. Sometimes she is able to cough up a brownish-green sputum but usually the cough is non-productive. She is using an albuterol inhaler at home. She took prednisone 50 mg for 4  days after leaving the ER. She was not put on antibiotics. To her knowledge, she is not allergic to any antibiotics.  The patient had been taking potassium 6x per day. Over the weekend, she only had enough to take it twice per day. She picked up her new prescription on Monday, which told her to take 5, so that is what she has been doing since then.   Past Medical History:  Diagnosis Date  . Cancer ([place])    colon, with mets to liver and left lung  . Hypertension   . Pneumonia    20+ years ago    Past Surgical History:  Procedure Laterality Date  . ABDOMINAL HYSTERECTOMY    . BREAST SURGERY    . COLON SURGERY  10/2017   [facility]  . COLONOSCOPY WITH PROPOFOL N/A 01/14/2017   Procedure: COLONOSCOPY WITH PROPOFOL;  Surgeon: Lucilla Lame, MD;  Location: [facility] ENDOSCOPY;  Service: Endoscopy;  Laterality: N/A;  . ELBOW SURGERY Right    Has had 4 surgeries on right elbow.  Marland Kitchen HAND SURGERY Right 2008  . JOINT REPLACEMENT    . LIVER BIOPSY  10/2017  . lung wedge resection  10/2017  . PORTA CATH INSERTION N/A 01/29/2017   Procedure: Glori Luis Cath Insertion;  Surgeon: Algernon Huxley, MD;  Location: [facility];  Service: Cardiovascular;  Laterality: N/A;  . TONSILLECTOMY      Family History  Problem Relation Age of Onset  . Lung cancer Mother 32  . Esophageal cancer Father 35  .  Brain cancer Maternal Grandmother     Social History:  reports that she has been smoking cigarettes. She has a 80.00 pack-year smoking history. She has never used smokeless tobacco. She reports that she does not drink alcohol and does not use drugs.She lives in [place]. She has 3 children (ages 69, 74, and 59). She smoked 2 packs a day. She started smoking at age 15. She initially stopped smoking on 10/24/2017. She now smokes 3 cigarettes a day and is trying to quit. 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 Tamara Hall is her significant other. The patient is alone  today.    Allergies: No Known Allergies  Current Medications: Current Outpatient Medications  Medication Sig Dispense Refill  . albuterol (VENTOLIN HFA) 108 (90 Base) MCG/ACT inhaler Inhale 1-2 puffs into the lungs every 6 (six) hours as needed for wheezing or shortness of breath.     Marland Kitchen amLODipine (NORVASC) 5 MG tablet Take 5 mg by mouth daily.    . baclofen (LIORESAL) 10 MG tablet Take 10 mg by mouth 3 (three) times daily.    . Calcium Carbonate (CALCIUM 500 PO) Take 1 tablet by mouth daily.    . folic acid (FOLVITE) 1 MG tablet TAKE 1 TABLET BY MOUTH EVERY DAY 90 tablet 1  . gabapentin (NEURONTIN) 300 MG capsule Take 1,200 mg by mouth 3 (three) times daily.     . hydrochlorothiazide (HYDRODIURIL) 25 MG tablet Take 25 mg by mouth daily.    Marland Kitchen HYDROcodone-acetaminophen (NORCO) 10-325 MG tablet SMARTSIG:1 Tablet(s) By Mouth 4-5 Times Daily    . nortriptyline (PAMELOR) 50 MG capsule Take 50 mg by mouth at bedtime.    . Ostomy Supplies (PREMIER DRAINABLE POUCH [phone]) Pouch MISC     . potassium chloride (KLOR-CON M10) 10 MEQ tablet TAKE 1 TABLET (10 MEQ TOTAL) BY MOUTH 5 (FIVE) TIMES DAILY. 120 tablet 0  . Budeson-Glycopyrrol-Formoterol (BREZTRI AEROSPHERE) 160-9-4.8 MCG/ACT AERO Inhale 2 puffs into the lungs in the morning and at bedtime. 10.7 g 0  . Budeson-Glycopyrrol-Formoterol (BREZTRI AEROSPHERE) 160-9-4.8 MCG/ACT AERO Inhale 2 puffs into the lungs in the morning and at bedtime. (Patient not taking: Reported on 09/27/2020) 5.9 g 0  . omeprazole (PRILOSEC) 20 MG capsule TAKE 1 CAPSULE BY MOUTH EVERY DAY 30 capsule 1  . predniSONE (STERAPRED UNI-PAK 21 TAB) 10 MG (21) TBPK tablet Take as directed in the package 21 each 0   No current facility-administered medications for this visit.    Review of Systems  Constitutional: Positive for weight loss (up 1 lb). Negative for chills, diaphoresis, fever and malaise/fatigue.       Feels better.  HENT: Negative.  Negative for congestion, ear  discharge, ear pain, hearing loss, nosebleeds, sinus pain, sore throat and tinnitus.   Eyes: Negative.  Negative for blurred vision.  Respiratory: Positive for cough and sputum production (occasional, brownish-green). Negative for hemoptysis and shortness of breath.   Cardiovascular: Negative.  Negative for chest pain, palpitations and leg swelling.  Gastrointestinal: Negative for abdominal pain, blood in stool, constipation (takes milk of magnesia daily), diarrhea, heartburn (takes medication every morning), melena, nausea and vomiting.       S/p parastomal hernia repair on 03/22/2020.  Genitourinary: Negative.  Negative for dysuria, frequency and urgency.  Musculoskeletal: Negative for back pain, joint pain, myalgias and neck pain.       Sore spots on arm, leg, breast, head, and neck s/p COVID-19 vaccine  Skin: Negative.  Negative for itching and rash.  Neurological: Negative for dizziness, sensory change, speech change, weakness and headaches.  Endo/Heme/Allergies: Negative.  Negative for environmental allergies. Does not bruise/bleed easily.  Psychiatric/Behavioral: Negative.  Negative for depression and memory loss. The patient is not nervous/anxious and does not have insomnia.   All other systems reviewed and are negative.  Performance status (ECOG): 1-2  Vitals Blood pressure 139/68, pulse 93, temperature (!) 96.6 F (35.9 C), temperature source Tympanic, resp. rate (!) 22, height 5' 7"  (1.702 m), weight 187 lb (84.8 kg), SpO2 96 %.   Physical Exam Vitals and nursing note reviewed.  Constitutional:      General: She is not in acute distress.    Appearance: She is well-developed. She is not diaphoretic.     Interventions: Face mask in place.     Comments:  Rolling walker by her side.  HENT:     Head: Normocephalic and atraumatic.     Comments: Long hair.    Mouth/Throat:     Mouth: Mucous membranes are moist. No oral lesions.     Pharynx: Oropharynx is clear.  Eyes:      General: No scleral icterus.    Conjunctiva/sclera: Conjunctivae normal.     Pupils: Pupils are equal, round, and reactive to light.     Comments: Blue eyes.  Neck:     Vascular: No JVD.  Cardiovascular:     Rate and Rhythm: Normal rate and regular rhythm.     Heart sounds: Normal heart sounds. No murmur heard.  No friction rub. No gallop.   Pulmonary:     Effort: Pulmonary effort is normal.     Breath sounds: Wheezing present.     Comments: Diffuse wheezes and squeaks. Chest:     Chest wall: No tenderness.  Abdominal:     General: Bowel sounds are normal. There is no distension.     Palpations: Abdomen is soft. There is no mass.     Tenderness: There is no abdominal tenderness. There is no guarding or rebound.  Musculoskeletal:        General: No swelling or tenderness. Normal range of motion.     Cervical back: Normal range of motion and neck supple.  Lymphadenopathy:     Head:     Right side of head: No preauricular, posterior auricular or occipital adenopathy.     Left side of head: No preauricular, posterior auricular or occipital adenopathy.     Cervical: No cervical adenopathy.     Upper Body:     Right upper body: No supraclavicular or axillary adenopathy.     Left upper body: No supraclavicular or axillary adenopathy.     Lower Body: No right inguinal adenopathy. No left inguinal adenopathy.  Skin:    General: Skin is warm.     Coloration: Skin is not pale.     Findings: No bruising, erythema, lesion or rash.  Neurological:     Mental Status: She is alert and oriented to person, place, and time.  Psychiatric:        Behavior: Behavior normal.        Thought Content: Thought content normal.        Judgment: Judgment normal.    Imaging studies: 01/13/2017:Abdomen and pelvic CTrevealed irregular thickening of the rectum suspicious for carcinoma. There was a 1.6 cm hypoattenuating lesion in the right hepatic lobe worrisome for metastatic  disease. 01/15/2017:Chest CT revealed a 9 mm noncalcified left lower lobe pulmonary nodule (  new). 01/16/2017:Pelvic MRIrevealed rectal adenocarcinoma T3N1. There was extension beyond the muscularis propria (17 mm). There were mesorectal nodes >= 5 mm. There was adenopathy on the left side of the mesorectum, including a 9 mm lesion. The distance from the tumor to the anal sphincter was 8 mm. 01/27/2017:PET scanrevealed intense metabolic activity associated rectal mass consists with primary rectal carcinoma. There was a hypermetabolic metastatic lesion to the central LEFT hepatic lobe. There was metabolic activity associated with small LEFT lower lobe nodule which was most consistent with pulmonary metastasis. 01/31/2017:Liver MRIrevealed a 2.6 x 2.7 x 2.1 cm lesion in segment VIII of the liver near the junction with segment IVa c/w a metastatic lesion. There was a 1.1 x 0.8 cm suspected metastatic lesion peripherally in segment VI of the liver.  05/16/2017:Abdomen and pelvic CTrevealed persistent but decreased asymmetric wall thickening in the rectum. The medial segment left liver lesion was smaller (2.7 x 2.6 cm to 1.9 x 1.6 cm). A tiny lesion seen in segment VI of the liver on previous MRI was not evident on today's CT scan. The left lower lobe pulmonary nodule was smaller (9 mm to 6 mm). There was no new or progressive findings in the abdomen and pelvis. 08/19/2017:Chest CTwith contrast on 08/19/2017 revealed left lower lobe 6 mm solid pulmonary nodule, decreased since 01/15/2017 chest CT. Stable 7 mm right lower lobe pulmonary nodule. There was no new or progressive metastatic disease in the chest.  09/03/2018: Pelvic MRI at [facility] demonstrated a partial response of the primary tumor and extramural disease. Post treatment category ymrT T3b. There was an overall decrease in size in the previously seen perirectal node. Hypointense changes at the site of prior tumor focally  contacedt buts does not definitely invade the LEFT levator ani. Tumor contacted, but does not definitely invade the internal anal sphincter. No evidence of residual or recurrent hepatic disease following microwave ablation. 02/17/2018:Abdomen and pelvic CTrevealed evolution of the ablation zone in segment 4A, now measuring 3.7 x 3.1 cm, decreased.There was no findings to suggest viable tumor.She is s/pabdominoperineal resection with left lower quadrant colostomy. There was no evidence of new/progressive metastatic disease.She is s/pleft lower lobe pulmonary wedge resection, incompletely visualized. 03/04/2018:Chest CTrevealed a stable chest CT (compared to11/13/2018). The index right lower lobe lung nodule measured7 mm (unchanged). There was no new or progressive nodularity identified. There was continued decrease in size of ablation defect within segment 4a of the liver. 08/13/2019:[facility] parastomal hernia with small bowel as well as multiple small bowel loops whichwere dilated.There was hepatic steatosis and post-treatment changes in the pelvis with persistent presacral soft tissue. There was a 3.5 cm infrarenal abdominal aortic aneurysm. 09/09/2019:Chest CT at [facility] no intrathoracic metastasis and no new or enlarging pulmonary nodules. 09/09/2019:Abdomen and pelvis CT at [facility] multiple subcentimeter hypodensities in the dome of the liver, which appeared new/more conspicuous from prior CT. There were not seen on the prior MRI. Further evaluation with MRI was recommended. CT evaluationwas limited due to background fatty liver and the heterogeneity/these lesions could have been secondary to fatty liver. There was unchanged soft tissue stranding and nodularity in the pelvis, with a few prominent more nodular areas. There were a few more prominent indeterminateareasand short-term follow up was recommended to ensure  stability and exclude peritoneal disease. Further evaluation with MRI could have been helpful. 11/12/2019: Liver and pelvis MRIrevealedno findings to explain the patient's history of rising CEA. There was irregular presacral soft tissue with apparent scarring noted in  the left paracentral pelviswas similar to prior studies and presumably treatment related. PET-CT could be used, as clinically warranted to exclude hypermetabolic disease within thatregion. There was an ablation defect identified in segment IV with minimal irregular enhancement along the anterosuperior margin, indeterminate. Attention on follow-up recommended.There was a tiny cyst identified in the dome of the right liver without other findings to suggest metastatic disease in the hepatic parenchyma. 11/29/2019:PET scanrevealed 1.6 x 1.5 cmnodularthickeningat the resection margin (SUV 11.3)in the LEFT lower lobe. There was no evidence of hepatic metastasis. There was no evidence of local recurrence in pelvis. There was insufficiency fracture in the LEFT sacral ala presumably related to radiation treatment. 02/23/2020:  Chest, abdomen and pelvis CT revealed a stable 2.6 x 1.7 cm partially calcified left infrahilar pulmonary nodule, hypermetabolic on PET scan and likely metastasis.  There were new no new or enlarging pulmonary nodules.  There were stable treated hepatic metastatic disease with no evidence of progression or local recurrence.  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  There is no evidence of incarceration or obstruction.  There was mild wall thickening of multiple loops of small bowel in the mid abdomen, possibly related to prior radiation therapy.  There was stable chronic sacral insufficiency fractures bilaterally.  There was a stable 3.7 cm fusiform infrarenal abdominal aortic aneurysm.  03/22/2020:  Abdomen and pelvis CT at [facility] revealed a high-grade small bowel  obstruction. There were 2 separate loops of small bowel contained within the parastomal hernia. The first was more proximal small bowel which appeared dilated but nonobstructed. The second segment of included small bowel was more distal small bowel which appeared dilated entering into the herniaand collapsed exiting the hernia, consistent with point of obstruction.  There was a 3.7 cm infrarenal abdominal aortic aneurysm with extensive mural thrombus, similar in size compared with previous examination, although degree of mural thrombus had increased. Additional calcified atherosclerotic plaque was noted throughout the abdominal aorta and its branches.  08/22/2020:  Chest CT with contrast revealed mild reduction in size of the left infrahilar peribronchovascular nodule (1.2 x 2.2 cm, previously 1.5 x 2.3 cm. There was patchy ground-glass opacity in the left upper lobe and lingula as well as the left lower lobe, favoring radiation pneumonitis given the patient's radiation therapy, atypical infection was a less likely differential diagnostic consideration. There was coronary, aortic arch, and branch vessel atherosclerotic vascular disease, old granulomatous disease, and ablation defect in the left hepatic lobe.   Office Visit on 08/30/2020  Component Date Value Ref Range Status  . Sodium 08/30/2020 135  135 - 145 mmol/L Final  . Potassium 08/30/2020 3.9  3.5 - 5.1 mmol/L Final  . Chloride 08/30/2020 96* 98 - 111 mmol/L Final  . CO2 08/30/2020 29  22 - 32 mmol/L Final  . Glucose, Bld 08/30/2020 114* 70 - 99 mg/dL Final   Glucose reference range applies only to samples taken after fasting for at least 8 hours.  . BUN 08/30/2020 10  8 - 23 mg/dL Final  . Creatinine, Ser 08/30/2020 0.85  0.44 - 1.00 mg/dL Final  . Calcium 08/30/2020 8.6* 8.9 - 10.3 mg/dL Final  . GFR, Estimated 08/30/2020 >60  >60 mL/min Final   Comment: (NOTE) Calculated using the CKD-EPI Creatinine Equation (2021)   . Anion gap  08/30/2020 10  5 - 15 Final   Performed at [facility], [address] [place], [place] [address]    Assessment:  AHNYLA MENDEL is a 64 y.o. female with clinical stage T3N1M1rectal cancer. She presented with a 9 month history of rectal bleeding. Colonoscopyon 01/14/2017 revealed afrond-like/villous non-obstructing large mass in the rectum. The mass was non-circumferential. Biopsies revealed high grade dysplasia within an adenoma (? sampling error). CEAwas 31.6 on 01/15/2017.  PET scanon 01/27/2017 revealed intense metabolic activity associated rectal mass consists with primary rectal carcinoma. There was a hypermetabolic metastatic lesion to the central LEFT hepatic lobe. There was metabolic activity associated with small LEFT lower lobe nodule which was most consistent with pulmonary metastasis.  CT guided liver biopsyat [facility] on 02/07/2017 revealed malignant cells c/w metastatic colorectal adenocarcinoma. [facility] on 12/29/2019 [phone]) revealed MS-stable, 1 Muts/Mb tumor mutational burden; KRAS G12C, PTEN rearrangement intron 8, APC G1106*, T1430f*18, and NRAS wildtype.  There were no reportable mutations in BRAF and NRAS.     She received 2 cycles of FOLFOX (02/18/2017 - 03/04/2017). She developed transient cold induced neuropathy. She developed wheezing and hypoxia with cycle #2 felt secondary to oxaliplatin. She received 4 cycles ofFOLFIRI(03/24/2017 - 04/22/2017) with Neulasta support. Diarrhea was controlled with Imodium.  She underwent CT guided microwave ablation of the liver lesionon 06/06/2017.   She began radiationon 06/18/2017. She receiveddaily XMelody Haverradiation. Radiation and Xeloda completed on 08/15/2017.  She underwentAPR with descending colostomy and VRAM flapon 10/24/2017. She underwent a VATS wedge resectionto her LEFT lung. Pathology from the lung demonstrateda 1 cmmetastatic intestinal type adenocarcinoma c/w  metastasis from patient's known colorectal carcinoma.Pathology from the rectal resection revealed a 2.1 cm invasive moderately differentiated adenocarcinoma arising in a tubular adenoma with transmural invasion focally into the perirectal soft tissue. There was no lymphovascular or perineural invasion. Margins were negative. Zero of 20 lymph nodes were positive. Final diagnosiswas [id number] adenocarcinoma with metastasis to left lower lobe of the lung.  She has had post-operative pain and ostomy ischemia. She underwent surgical ostomy revisionon 11/28/2017. Pathologyshowed focal mucosal erosion and fat necrosis c/w ischemia. There was extensive underlying fat necrosis. No dysplasia or carcinoma was identified. She was treated with oral Augmentin and wound packing. She has been treated with oxycodone 5 mg po q 4 hrs prn, gabapentin 900 mg TID. She was treated with Bactrim on 01/18/2018. She was referred to local pain management clinic on 01/27/2018.  PET scanon 11/29/2019 showed local recurrenceof hypermetabolic nodular lung malignancy at the resection margin(1.6 x 1.5 cm; SUV 11.3)in the LEFT lower lobe. There was no evidence of hepatic metastasis. There was no evidence of local recurrence in pelvis. There was insufficiency fracture in the LEFT sacral ala presumably related to radiation treatment.  Chest CTon 12/13/2019 revealed anenlarging partially calcified2.3 x 1.2 cmlesion in the medial aspect of the left lower lobe most compatible with a pulmonary metastasis given the hypermetabolism on recent PET-CT.There were several other tiny pulmonary nodules measuring 6 mm or less in size, some of whichwere new or increased in size compared to prior examinations.  She declined surgical resection.  Chest, abdomen and pelvis CT on 02/23/2020 revealed a stable 2.6 x 1.7 cm partially calcified left infrahilar pulmonary nodule, hypermetabolic on PET scan and likely metastasis.   There were new no new or enlarging pulmonary nodules.  There were stable treated hepatic metastatic disease with no evidence of progression or local recurrence.  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  There is no evidence of incarceration or obstruction.  There was mild wall thickening of multiple loops of small  bowel in the mid abdomen, possibly related to prior radiation therapy.  There was stable chronic sacral insufficiency fractures bilaterally.  There was a stable 3.7 cm fusiform infrarenal abdominal aortic aneurysm.   She is s/p 1 cycle of FOLFIRI (03/08/2020) with Fulphila support.  Cycle #1 was notable for nausea, vomiting, and diarrhea.  She declined further chemotherapy.  She received IMRT (6000 cGy) to the left lung lesion from 05/10/2020 - 05/22/2020.   Chest CT with contrast on 08/22/2020 revealed mild reduction in size of the left infrahilar peribronchovascular nodule (1.2 x 2.2 cm, previously 1.5 x 2.3 cm). There was patchy ground-glass opacity in the left upper lobe and lingula as well as the left lower lobe, favoring radiation pneumonitis.  CEAhas been followed: 31.6 on 01/15/2017, 38.1 on 02/18/2017, 25.1 on 03/18/2017, 11.8 on 04/08/2017, 9.2 on 04/22/2017, 9.5 on 05/20/2017, 9.6 on 06/19/2017, 3.7 on 02/18/2018,5.8 on 06/26/2018, 10.5 on 11/20/2018, 19.5 on 03/26/2019, 34.0 on 07/21/2019, 46.4 on 09/09/2019, 54.4 on 10/28/2019, 108.0 on 02/10/2020, 121.0 on 03/08/2020, 49.8 on 04/13/2020, 76.7 on 05/29/2020, 40.9 on 06/13/2020, and 7.7 on 08/07/2020.  Colonoscopy at [facility] 09/13/2019 did not appear malignant with no adenomatous (granulation tissue). There was diverticulosis in the descending colon, normal mucosa in the entire examined colon and the examined portion of the ileum. There was a 9 mmlipoma in the transverse colon anda4 mm polyp at the surgical stoma.   She has a B12 deficiency.[id number] (low)on 12/03/2019.  She restarted B12 injections on 12/13/2019 (last 08/28/2020).  She has folate deficiency.  Folate was 5.8 (low) on 12/03/2019 and 68.0 on 08/07/2020.  She is on folic acid.    She has chronic hypokalemia.  She is on potassium 20 meq po q day.  [facility] for a smallbowel obstruction. She had severe abdominal pain with associated nausea and vomiting. Abdominal and pelvicCT revealedmultiple small bowel loops whichwere dilated.Obstruction resolved spontaneously.  She was admitted to [facility] from 03/22/2020 - 03/28/2020. She presented with an incarcerated hernia and underwent parastomal hernia repair with no bowel resection on 03/22/2020.  There was a 3.7 cm infrarenal abdominal aortic aneurysm with extensive mural thrombus, similar in size compared with previous examination, although degree of mural thrombus had increased.  She received the first dose of the Moderna COVID-19 vaccine on 08/28/2020.  Symptomatically, she feels "ok".  She still has a cough and shortness of breath; symptoms have improved. Sometimes she is able to cough up a brownish-green sputum. She is using an albuterol inhaler at home. She took prednisone 50 mg for 4 days (but no antibiotics ) after leaving the ER.   Plan: 1.   Review labs on 08/24/2020. 2.   Labs today:  BMP. 3.Stage IV rectal carcinoma Shereceived6cycles of FOLFOX(02/18/2017 -04/22/2017).  She underwent CT guided microwave ablation of the liver lesionon 06/06/2017.  She receivedradiationand daily Xeloda (06/18/2017-08/15/2017). She underwentAPR with descending colostomy and VRAM flapon 10/24/2017.  She underwent a VATS wedge resection of her left lung. 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  She declined left lower lobe lobectomy.  She received 1 cycle of  FOLFIRI (03/08/2020).                She declined further chemotherapy or surgery.  She completed SBRT to the left infrahilar nodule on 05/22/2020.  CEA was 40.9 on 06/13/2020 and 7.7 on 08/07/2020.   Chest CT on 08/22/2020 revealed the eft infrahilar nodule was slightly smaller.  Clinically, she is doing  well.  Continue close surveillance. 4. B12 and folate deficiency [id number] (low)on 12/03/2019 and folate 5.8 (low) on 12/03/2019. She receives B12 monthly (last 08/28/2020).             Folate was 68.0 on 08/07/2020.  Continue B12 monthly.            5.Hypokalemia Patient has a history of chronic potassium wasting.  Potassium was 3.3 on 08/24/2020 and is 3.9 today. Continue potassium 50 mEq a day.  Encourage follow-up with Dr. Gordy Levan regarding HCTZ induced hypokalemia 6.   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  Patient has received a course of steroids.  Rx:  Azithromycin.  Discuss with Dr Patsey Berthold- patient to be seen in follow-up. 7.   RN:  Call with results.  If potassium is low, increase to 6 pills/day of potasium and contact pharmacy regarding update on medications. 8.   Consult pulmonary medicine (Dr Patsey Berthold). 9.   RTC in 1 month for MD assessment and labs (TBD).  I discussed the assessment and treatment plan with the patient.  The patient was provided an opportunity to ask questions and all were answered.  The patient agreed with the plan and demonstrated an understanding of the instructions.  The patient was advised to call back if the symptoms worsen or if the condition fails to improve as anticipated.   I provided 24 minutes of face-to-face time during this this encounter and > 50% was spent counseling as documented under my assessment and plan.  An additional 10 minutes were spent reviewing her chart (Epic and Care Everywhere) including notes, labs, and imaging studies.    Lequita Asal, MD, PhD    08/30/2020, 5:00 PM  I, Mirian Mo Tufford, am acting as a Education administrator for [employer]. Mike Gip, MD.   I, Coralie Stanke C. Mike Gip, MD, have reviewed the above documentation for accuracy and completeness, and I agree with the above.

## 2020-08-30 ENCOUNTER — Encounter: Payer: Self-pay | Admitting: Hematology and Oncology

## 2020-08-30 ENCOUNTER — Inpatient Hospital Stay (HOSPITAL_BASED_OUTPATIENT_CLINIC_OR_DEPARTMENT_OTHER): Payer: Medicare HMO | Admitting: Hematology and Oncology

## 2020-08-30 ENCOUNTER — Other Ambulatory Visit: Payer: Self-pay

## 2020-08-30 ENCOUNTER — Inpatient Hospital Stay: Payer: Medicare HMO

## 2020-08-30 VITALS — BP 139/68 | HR 93 | Temp 96.6°F | Resp 22 | Ht 67.0 in | Wt 187.0 lb

## 2020-08-30 DIAGNOSIS — J188 Other pneumonia, unspecified organism: Secondary | ICD-10-CM | POA: Insufficient documentation

## 2020-08-30 DIAGNOSIS — C20 Malignant neoplasm of rectum: Secondary | ICD-10-CM

## 2020-08-30 DIAGNOSIS — Z7189 Other specified counseling: Secondary | ICD-10-CM

## 2020-08-30 DIAGNOSIS — J700 Acute pulmonary manifestations due to radiation: Secondary | ICD-10-CM

## 2020-08-30 DIAGNOSIS — E538 Deficiency of other specified B group vitamins: Secondary | ICD-10-CM | POA: Diagnosis not present

## 2020-08-30 DIAGNOSIS — J189 Pneumonia, unspecified organism: Secondary | ICD-10-CM

## 2020-08-30 DIAGNOSIS — C7802 Secondary malignant neoplasm of left lung: Secondary | ICD-10-CM | POA: Diagnosis not present

## 2020-08-30 DIAGNOSIS — E876 Hypokalemia: Secondary | ICD-10-CM

## 2020-08-30 DIAGNOSIS — R918 Other nonspecific abnormal finding of lung field: Secondary | ICD-10-CM

## 2020-08-30 LAB — BASIC METABOLIC PANEL
Anion gap: 10 (ref 5–15)
BUN: 10 mg/dL (ref 8–23)
CO2: 29 mmol/L (ref 22–32)
Calcium: 8.6 mg/dL — ABNORMAL LOW (ref 8.9–10.3)
Chloride: 96 mmol/L — ABNORMAL LOW (ref 98–111)
Creatinine, Ser: 0.85 mg/dL (ref 0.44–1.00)
GFR, Estimated: >60 mL/min (ref >60)
Glucose, Bld: 114 mg/dL — ABNORMAL HIGH (ref 70–99)
Potassium: 3.9 mmol/L (ref 3.5–5.1)
Sodium: 135 mmol/L (ref 135–145)

## 2020-08-30 MED ORDER — AZITHROMYCIN 250 MG PO TABS: ORAL_TABLET | ORAL | 0 refills | Status: DC

## 2020-08-30 NOTE — Progress Notes (Signed)
Patient here for follow-up for rectal cancer. Patient reports ongoing cough. Patient reports that Dr. Oren Bracket had her get a covid testing performed with was negative. She does not have any paper reports to confirm the negative testing. Pt states that she was informed by Dr. Massie Maroon that her symptoms were not related to radiation pneumonitis. She denies any N&V or Diarrhea. Her ostomy is functioning well.   She had her moderna covid Vaccine on Monday 08/28/2020.

## 2020-09-04 ENCOUNTER — Telehealth: Payer: Self-pay

## 2020-09-04 NOTE — Telephone Encounter (Signed)
appt scheduled for 09/06/2020 at 1:30. Nothing further needed.

## 2020-09-04 NOTE — Telephone Encounter (Signed)
Let us shoot for a Wednesday or or Thursday. May open slot.

## 2020-09-04 NOTE — Telephone Encounter (Signed)
Per Dr. Patsey Berthold-- offer visit today at 3:00  Spoke to patient and offered appointment. Patient can not make this appointment, as she has another appointment at 2:00.  Dr. Patsey Berthold, please advise. Thanks

## 2020-09-06 ENCOUNTER — Other Ambulatory Visit: Payer: Self-pay

## 2020-09-06 ENCOUNTER — Ambulatory Visit (INDEPENDENT_AMBULATORY_CARE_PROVIDER_SITE_OTHER): Payer: Medicare HMO | Admitting: Pulmonary Disease

## 2020-09-06 ENCOUNTER — Encounter: Payer: Self-pay | Admitting: Pulmonary Disease

## 2020-09-06 VITALS — BP 128/60 | HR 97 | Temp 97.8°F | Ht 66.0 in | Wt 184.4 lb

## 2020-09-06 DIAGNOSIS — J9809 Other diseases of bronchus, not elsewhere classified: Secondary | ICD-10-CM | POA: Diagnosis not present

## 2020-09-06 DIAGNOSIS — J441 Chronic obstructive pulmonary disease with (acute) exacerbation: Secondary | ICD-10-CM | POA: Diagnosis not present

## 2020-09-06 DIAGNOSIS — R0602 Shortness of breath: Secondary | ICD-10-CM

## 2020-09-06 DIAGNOSIS — C19 Malignant neoplasm of rectosigmoid junction: Secondary | ICD-10-CM | POA: Diagnosis not present

## 2020-09-06 MED ORDER — BREZTRI AEROSPHERE 160-9-4.8 MCG/ACT IN AERO: 2.0000 | INHALATION_SPRAY | Freq: Two times a day (BID) | RESPIRATORY_TRACT | 0 refills | Status: DC

## 2020-09-06 MED ORDER — PREDNISONE 10 MG (21) PO TBPK: ORAL_TABLET | ORAL | 0 refills | Status: DC

## 2020-09-06 NOTE — Progress Notes (Signed)
Subjective:    Patient ID: Tamara Hall, female    DOB: June 27, 1956, 64 y.o.   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  She is kindly referred by Dr. Nolon Stalls.  The patient has had a complicated history with her colorectal cancer and has had metastatic disease to the lung noted on an infrahilar nodule.  She received radiation to this infrahilar nodule and completed this on 22 May 2020.  She had been doing fairly well until approximately 2 to 3 weeks ago.  As noted she has a longstanding history of smoking.  For at least the last 6 months she has decreased her cigarette use to 3 cigarettes/day.  She has not noted any fevers or chills.Cough as noted has been mostly nonproductive.  She has had a Z-Pak with no relief in her symptoms.  She also had a very short course of steroids with perhaps some minimal response.  She notes that using her husband's Symbicort inhaler helps.  Albuterol alone does not help.  The chest on 22 August 2020 that showed reduction in the size of the previously noted left infrahilar nodule as well as patchy groundglass opacity in the left upper lobe and lingula as well as the left lower lobe.  This was read as potential radiation pneumonitis.  Subsequently she was seen at the ED because of her shortness of breath a CT angio of the chest was performed at that time that showed no pulmonary embolism.  There were clusters of groundglass density primary on the lingula as well as the left lower lobe and infrahilar region.  There was disimpaction of the left lower lobe bronchi however there was also narrowing with peribronchial thickening in this area.  Representative slice of this area is below.  Reviewing Dr. Kem Parkinson notes her  care summary for her stage IV rectal carcinoma is as follows: Stage IV rectal carcinoma Shereceived6cycles of FOLFOX(02/18/2017 -04/22/2017).  She underwent CT guided microwave ablation of the liver lesionon 06/06/2017.  She receivedradiationand daily Xeloda (06/18/2017-08/15/2017). She underwentAPR with descending colostomy and VRAM flapon 10/24/2017.  She underwent a VATS wedge resection of her left lung. 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             She declined left lower lobe lobectomy.             She received 1 cycle of FOLFIRI (03/08/2020).             She declined further chemotherapy or surgery.             She completed SBRT to the left infrahilar nodule on 05/22/2020.             CEA was 40.9 on 06/13/2020 and 7.7 on 08/07/2020.              Chest CT on 08/22/2020                          Left infrahilar nodule is slightly smaller.  I reviewed the patient's [facility] records.  Prior to colorectal cancer resection  in 2019 she underwent spirometry with DLCO.  Her FEV1 at that time was noted to be 1.84 L  and FVC was 2.56 L.  Her diffusion capacity was also reduced.  This is at least consistent with mild to moderate COPD.  Patient was not aware of these results.  Also was not aware that she had a diagnosis of COPD.  Review of Systems A 10 point review of systems was performed and it is as noted above otherwise negative.  Past Medical History:  Diagnosis Date  . Cancer ([place])    colon, with mets to liver and left lung  . Hypertension   . Pneumonia    20+ years ago   Patient Active Problem List   Diagnosis Date Noted  . Ground glass opacity present on imaging of lung 08/30/2020  . Atypical pneumonia 08/30/2020  . Gastroesophageal reflux disease with esophagitis 06/04/2020  . Folate deficiency 12/04/2019  . B12  deficiency 11/15/2019  . H/O small bowel obstruction 10/29/2019  . Pain in pelvis 10/28/2019  . SBO (small bowel obstruction) ([place]) 08/13/2019  . Diarrhea 07/11/2017  . Hypokalemia 07/08/2017  . Malignant neoplasm metastatic to left lung ([place]) 05/27/2017  . Nausea without vomiting 05/06/2017  . Liver metastases ([place]) 02/25/2017  . Goals of care, counseling/discussion 02/18/2017  . Encounter for antineoplastic chemotherapy 02/18/2017  . Cancer related pain 02/16/2017  . Tobacco use 02/06/2017  . Abnormal findings-gastrointestinal tract   . Rectum neoplasm   . Rectal bleeding 01/13/2017  . Rectal cancer [facility])    Past Surgical History:  Procedure Laterality Date  . ABDOMINAL HYSTERECTOMY    . BREAST SURGERY    . COLON SURGERY  10/2017   [facility]  . COLONOSCOPY WITH PROPOFOL N/A 01/14/2017   Procedure: COLONOSCOPY WITH PROPOFOL;  Surgeon: Lucilla Lame, MD;  Location: [facility] ENDOSCOPY;  Service: Endoscopy;  Laterality: N/A;  . ELBOW SURGERY Right    Has had 4 surgeries on right elbow.  Marland Kitchen HAND SURGERY Right 2008  . JOINT REPLACEMENT    . LIVER BIOPSY  10/2017  . lung wedge resection  10/2017  . PORTA CATH INSERTION N/A 01/29/2017   Procedure: Glori Luis Cath Insertion;  Surgeon: Algernon Huxley, MD;  Location: [place] [facility];  Service: Cardiovascular;  Laterality: N/A;  . TONSILLECTOMY     Family History  Problem Relation Age of Onset  . Lung cancer Mother 71  . Esophageal cancer Father 20  . Brain cancer Maternal Grandmother    Social History   Tobacco Use  . Smoking status: Current Every Day Smoker    Packs/day: 2.00    Years: 40.00    Pack years: 80.00    Types: Cigarettes  . Smokeless tobacco: Never Used  . Tobacco comment: 3 cigs daily--09/06/2020  Substance Use Topics  . Alcohol use: No   Immunization History  Administered Date(s) Administered  . DTaP 09/10/1996  . Influenza,inj,Quad PF,6+ Mos 08/05/2017, 07/08/2018, 07/21/2019, 08/07/2020  . Moderna SARS-COVID-2  Vaccination 08/28/2020  . Pneumococcal Polysaccharide-23 02/06/2017       Objective:   Physical Exam BP 128/60 (Cuff Size: Normal)   Pulse 97   Temp 97.8 F (36.6 C) (Temporal)   Ht 5\' 6"  (1.676 m)   Wt 184 lb 6.4 oz (83.6 kg)   SpO2 96%   BMI 29.76 kg/m  GENERAL: Well-developed well-nourished woman in no acute distress.  Occasional dry cough noted.  Uses walker for ambulation.  Raspy voice HEAD: Normocephalic, atraumatic.  EYES: Pupils equal, round, reactive to light.  No scleral icterus.  MOUTH: Nose/mouth/throat not examined due to masking requirements for COVID 19. NECK:  Supple. No thyromegaly. Trachea midline. No JVD.  No adenopathy. PULMONARY: Good air entry bilaterally.  She has scattered rhonchi and end expiratory wheezes left greater than right. CARDIOVASCULAR: S1 and S2. Regular rate and rhythm.  No rubs, murmurs or gallops heard. ABDOMEN: Benign. MUSCULOSKELETAL: No joint deformity, no clubbing, no edema.  NEUROLOGIC: No focal deficits, speech is fluent.  Gait assisted with walker. SKIN: Intact,warm,dry.  PSYCH: Mood and behavior normal.  Representative slice of CT performed 08/24/2020 narrowing noted on the left mainstem to left lower lobe bronchi to be related to mucosal thickening/radiation effect       Assessment & Plan:     ICD-10-CM   1. Shortness of breath  R06.02 Pulmonary Function Test [facility] Only   Likely due to COPD with acute exacerbation Aggravated by postobstructive process as below PFTs  2. COPD with acute exacerbation (HCC)  J44.1    Prednisone taper Initiate Breztri 2 puffs twice a day   3. 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 Colorectal cancer, stage IV ([place])  C19    Issue adds complexity to her management Has known lung metastasis   Orders Placed This Encounter  Procedures  . Pulmonary  Function Test [facility] Only    Standing Status:   Future    Standing Expiration Date:   09/06/2021    Order Specific Question:   Full PFT: includes the following: basic spirometry, spirometry pre & post bronchodilator, diffusion capacity (DLCO), lung volumes    Answer:   Full PFT   Meds ordered this encounter  Medications  . Budeson-Glycopyrrol-Formoterol (BREZTRI AEROSPHERE) 160-9-4.8 MCG/ACT AERO    Sig: Inhale 2 puffs into the lungs in the morning and at bedtime.    Dispense:  10.7 g    Refill:  0    Order Specific Question:   Lot Number?    Answer:   [id number]    Order Specific Question:   Expiration Date?    Answer:   06/07/2021    Order Specific Question:   Manufacturer?    Answer:   [employer] [[id number]]    Order Specific Question:   Quantity    Answer:   2  . Budeson-Glycopyrrol-Formoterol (BREZTRI AEROSPHERE) 160-9-4.8 MCG/ACT AERO    Sig: Inhale 2 puffs into the lungs in the morning and at bedtime.    Dispense:  5.9 g    Refill:  0    Order Specific Question:   Lot Number?    Answer:   [id number]    Order Specific Question:   Manufacturer?    Answer:   [employer] [[id number]]    Order Specific Question:   Quantity    Answer:   1  . predniSONE (STERAPRED UNI-PAK 21 TAB) 10 MG (21) TBPK tablet    Sig: Take as directed in the package    Dispense:  21 each    Refill:  0   Discussion: Appears to have a prolonged exacerbation of COPD.  She is not on standing medications for her COPD.  Previous spirometry performed at [facility] in 2019 revealed that she had COPD.  Patient was not aware of this.  She will receive the prednisone taper and we will initiate therapy with Breztri 2 puffs twice a day.  She is going to be scheduled for PFTs which will be done prior to her return appointment in 4 to 6 weeks time.  She is to contact  us prior to that time should any new difficulties arise.  With regards to the bronchial stenosis post radiation this is actually very well described phenomenon that can occur  anywhere between 2 to 48 months after radiation therapy.  We will treat the patient with steroids as above.  If the issue persists we will consider bronchoscopy with therapy and balloon dilation at that point.  Usually helps resolve the issue.  Renold Don, MD [facility]   *This note was dictated using voice recognition software/Dragon.  Despite best efforts to proofread, errors can occur which can change the meaning.  Any change was purely unintentional.

## 2020-09-06 NOTE — Patient Instructions (Signed)
We are going to give you a taper prednisone pack  We are giving you samples of Breztri 2 puffs twice a day make sure you rinse your mouth well after you use it  Get a spacer to help you with getting the Breztri deep into your lungs  We are scheduling breathing tests  We will see you in follow-up in 4 to 6 weeks time contact us if any new problems arise

## 2020-09-08 ENCOUNTER — Other Ambulatory Visit: Payer: Self-pay | Admitting: Hematology and Oncology

## 2020-09-08 DIAGNOSIS — K2100 Gastro-esophageal reflux disease with esophagitis, without bleeding: Secondary | ICD-10-CM

## 2020-09-10 DIAGNOSIS — R0602 Shortness of breath: Secondary | ICD-10-CM | POA: Insufficient documentation

## 2020-09-15 ENCOUNTER — Inpatient Hospital Stay: Payer: Medicare HMO

## 2020-09-25 ENCOUNTER — Inpatient Hospital Stay: Payer: Medicare HMO | Attending: Hematology and Oncology

## 2020-09-25 DIAGNOSIS — E876 Hypokalemia: Secondary | ICD-10-CM | POA: Insufficient documentation

## 2020-09-25 DIAGNOSIS — C7802 Secondary malignant neoplasm of left lung: Secondary | ICD-10-CM | POA: Insufficient documentation

## 2020-09-25 DIAGNOSIS — F17210 Nicotine dependence, cigarettes, uncomplicated: Secondary | ICD-10-CM | POA: Insufficient documentation

## 2020-09-25 DIAGNOSIS — E538 Deficiency of other specified B group vitamins: Secondary | ICD-10-CM | POA: Insufficient documentation

## 2020-09-25 DIAGNOSIS — C20 Malignant neoplasm of rectum: Secondary | ICD-10-CM | POA: Insufficient documentation

## 2020-09-26 NOTE — Progress Notes (Signed)
[facility]  [address] [place], [place] [address] Phone: [phone]  Fax: [phone]   Clinic Day:  09/27/2020  Referring physician: Loni Muse, MD  Chief Complaint: Tamara Hall is a 64 y.o. 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  HPI: The patient was last seen in the medical oncology clinic on 08/30/2020. At that time, she felt "ok".  Cough and shortness of breath had improved  She had completed a prednisone pulse by the ER. Potassium was 3.9 and calcium 8.6. She was to see pulmonology.  The patient saw Dr. Patsey Berthold on 09/06/2020.  She was felt to have a prolonged exacerbation of COPD.  She was given a taper prednisone pack and samples of Breztri 2 puffs daily. PFTs were scheduled.  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  Follow up was planned for 4-6 weeks.  During the interim, she states that she cannot tell any difference with her breathing.  She cannot lay on her left side.  Per Dr. Patsey Berthold, Gemzar felt secondary to radiation.  She is going undergoing testing next week.  She coughs up "more stuff" with her inhaler.  She is taking potassium 50 mEq a day.  She takes milk of magnesia daily.   Past Medical History:  Diagnosis Date  . Cancer ([place])    colon, with mets to liver and left lung  . Hypertension   . Pneumonia    20+ years ago    Past Surgical History:  Procedure Laterality Date  . ABDOMINAL HYSTERECTOMY    . BREAST SURGERY    . COLON SURGERY  10/2017   [facility]  . COLONOSCOPY WITH PROPOFOL N/A 01/14/2017   Procedure: COLONOSCOPY WITH PROPOFOL;  Surgeon: Lucilla Lame, MD;  Location: [facility] ENDOSCOPY;  Service: Endoscopy;  Laterality: N/A;  . ELBOW SURGERY Right    Has had 4  surgeries on right elbow.  Marland Kitchen HAND SURGERY Right 2008  . JOINT REPLACEMENT    . LIVER BIOPSY  10/2017  . lung wedge resection  10/2017  . PORTA CATH INSERTION N/A 01/29/2017   Procedure: Glori Luis Cath Insertion;  Surgeon: Algernon Huxley, MD;  Location: [facility];  Service: Cardiovascular;  Laterality: N/A;  . TONSILLECTOMY      Family History  Problem Relation Age of Onset  . Lung cancer Mother 98  . Esophageal cancer Father 50  . Brain cancer Maternal Grandmother     Social History:  reports that she has been smoking cigarettes. She has a 80.00 pack-year smoking history. She has never used smokeless tobacco. She reports that she does not drink alcohol and does not use drugs.She lives in [place]. She has 3 children (ages 73, 69, and 66). She smoked 2 packs a day. She started smoking at age 64. She initially stopped smoking on 10/24/2017. She now smokes 3 cigarettes a day and is trying to quit. 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 [name] is her significant other. The patient is alone today.   Allergies: No Known Allergies  Current Medications: Current Outpatient Medications  Medication Sig Dispense Refill  . albuterol (VENTOLIN HFA) 108 (90 Base) MCG/ACT inhaler Inhale 1-2 puffs into the lungs every 6 (six)  hours as needed for wheezing or shortness of breath.     Marland Kitchen amLODipine (NORVASC) 5 MG tablet Take 5 mg by mouth daily.    . baclofen (LIORESAL) 10 MG tablet Take 10 mg by mouth 3 (three) times daily.    . Budeson-Glycopyrrol-Formoterol (BREZTRI AEROSPHERE) 160-9-4.8 MCG/ACT AERO Inhale 2 puffs into the lungs in the morning and at bedtime. 10.7 g 0  . Calcium Carbonate (CALCIUM 500 PO) Take 1 tablet by mouth daily.    . folic acid (FOLVITE) 1 MG tablet TAKE 1 TABLET BY MOUTH EVERY DAY 90 tablet 1  . gabapentin (NEURONTIN) 300 MG capsule Take 1,200 mg by mouth 3 (three) times daily.     . hydrochlorothiazide (HYDRODIURIL)  25 MG tablet Take 25 mg by mouth daily.    Marland Kitchen HYDROcodone-acetaminophen (NORCO) 10-325 MG tablet SMARTSIG:1 Tablet(s) By Mouth 4-5 Times Daily    . nortriptyline (PAMELOR) 50 MG capsule Take 50 mg by mouth at bedtime.    . Ostomy Supplies (PREMIER DRAINABLE POUCH [phone]) Pouch MISC     . predniSONE (STERAPRED UNI-PAK 21 TAB) 10 MG (21) TBPK tablet Take as directed in the package 21 each 0  . Budeson-Glycopyrrol-Formoterol (BREZTRI AEROSPHERE) 160-9-4.8 MCG/ACT AERO Inhale 2 puffs into the lungs in the morning and at bedtime. (Patient not taking: Reported on 09/27/2020) 5.9 g 0  . omeprazole (PRILOSEC) 20 MG capsule TAKE 1 CAPSULE BY MOUTH EVERY DAY 30 capsule 1  . potassium chloride (KLOR-CON M10) 10 MEQ tablet TAKE 1 TABLET (10 MEQ TOTAL) BY MOUTH 5 (FIVE) TIMES DAILY. 150 tablet 0   No current facility-administered medications for this visit.    Review of Systems  Constitutional: Positive for weight loss (1 pound). Negative for chills, diaphoresis, fever and malaise/fatigue.       Feels "the same".  HENT: Negative.  Negative for congestion, ear discharge, ear pain, nosebleeds, sinus pain and sore throat.   Eyes: Negative.  Negative for blurred vision, double vision, photophobia and pain.  Respiratory: Positive for cough, sputum production (productive with inhaler) and shortness of breath. Negative for hemoptysis.   Cardiovascular: Negative.  Negative for chest pain, palpitations, orthopnea, leg swelling and PND.  Gastrointestinal: Positive for constipation (takes MOM daily). Negative for abdominal pain, blood in stool, diarrhea, heartburn, melena, nausea and vomiting.  Genitourinary: Negative.  Negative for dysuria, frequency, hematuria and urgency.  Musculoskeletal: Negative for back pain, falls, joint pain, myalgias and neck pain.       Sore arm s/p recent vacuuming.  Skin: Negative.  Negative for itching and rash.  Neurological: Negative for dizziness, tingling, sensory change, speech  change, focal weakness, weakness and headaches.  Endo/Heme/Allergies: Negative.  Does not bruise/bleed easily.  Psychiatric/Behavioral: Negative for depression and memory loss. The patient is not nervous/anxious and does not have insomnia.   All other systems reviewed and are negative.  Performance status (ECOG): 1  Vitals Blood pressure (!) 121/54, pulse 71, temperature 97.7 F (36.5 C), temperature source Tympanic, resp. rate 18, height 5' 6"  (1.676 m), weight 186 lb 2.9 oz (84.5 kg), SpO2 98 %.   Physical Exam Vitals and nursing note reviewed.  Constitutional:      General: She is not in acute distress.    Appearance: She is well-developed. She is not ill-appearing or toxic-appearing.     Interventions: Face mask in place.  HENT:     Head: Normocephalic and atraumatic.     Comments: Long hair with highlights.    Mouth/Throat:  Mouth: Mucous membranes are moist. No oral lesions.     Pharynx: No oropharyngeal exudate or posterior oropharyngeal erythema.  Eyes:     General: No scleral icterus.    Extraocular Movements: Extraocular movements intact.     Conjunctiva/sclera: Conjunctivae normal.     Pupils: Pupils are equal, round, and reactive to light.     Comments: Blue eyes.  Neck:     Vascular: No JVD.  Cardiovascular:     Rate and Rhythm: Normal rate and regular rhythm.     Heart sounds: No murmur heard. No friction rub.  Pulmonary:     Effort: Pulmonary effort is normal. No respiratory distress.     Breath sounds: No stridor. No rhonchi or rales.     Comments: Diffuse soft wheezes. Chest:  Breasts:     Right: No axillary adenopathy or supraclavicular adenopathy.     Left: No axillary adenopathy or supraclavicular adenopathy.    Abdominal:     General: Abdomen is flat. Bowel sounds are normal. There is no distension.     Palpations: Abdomen is soft. There is no mass.     Tenderness: There is no abdominal tenderness. There is no guarding or rebound.   Musculoskeletal:        General: No swelling or tenderness. Normal range of motion.     Cervical back: Normal range of motion.     Right lower leg: No edema.     Left lower leg: No edema.  Lymphadenopathy:     Head:     Right side of head: No preauricular, posterior auricular or occipital adenopathy.     Left side of head: No preauricular, posterior auricular or occipital adenopathy.     Cervical: No cervical adenopathy.     Upper Body:     Right upper body: No supraclavicular or axillary adenopathy.     Left upper body: No supraclavicular or axillary adenopathy.     Lower Body: No right inguinal adenopathy. No left inguinal adenopathy.  Skin:    General: Skin is warm and dry.     Coloration: Skin is not jaundiced or pale.     Findings: No bruising, erythema or rash.  Neurological:     Mental Status: She is alert and oriented to person, place, and time. Mental status is at baseline.  Psychiatric:        Mood and Affect: Mood normal.        Behavior: Behavior normal.        Thought Content: Thought content normal.        Judgment: Judgment normal.    Imaging studies: 01/13/2017:Abdomen and pelvic CTrevealed irregular thickening of the rectum suspicious for carcinoma. There was a 1.6 cm hypoattenuating lesion in the right hepatic lobe worrisome for metastatic disease. 01/15/2017:Chest CT revealed a 9 mm noncalcified left lower lobe pulmonary nodule (new). 01/16/2017:Pelvic MRIrevealed rectal adenocarcinoma T3N1. There was extension beyond the muscularis propria (17 mm). There were mesorectal nodes >= 5 mm. There was adenopathy on the left side of the mesorectum, including a 9 mm lesion. The distance from the tumor to the anal sphincter was 8 mm. 01/27/2017:PET scanrevealed intense metabolic activity associated rectal mass consists with primary rectal carcinoma. There was a hypermetabolic metastatic lesion to the central LEFT hepatic lobe. There was metabolic  activity associated with small LEFT lower lobe nodule which was most consistent with pulmonary metastasis. 01/31/2017:Liver MRIrevealed a 2.6 x 2.7 x 2.1 cm lesion in segment VIII of the liver near the  junction with segment IVa c/w a metastatic lesion. There was a 1.1 x 0.8 cm suspected metastatic lesion peripherally in segment VI of the liver.  05/16/2017:Abdomen and pelvic CTrevealed persistent but decreased asymmetric wall thickening in the rectum. The medial segment left liver lesion was smaller (2.7 x 2.6 cm to 1.9 x 1.6 cm). A tiny lesion seen in segment VI of the liver on previous MRI was not evident on today's CT scan. The left lower lobe pulmonary nodule was smaller (9 mm to 6 mm). There was no new or progressive findings in the abdomen and pelvis. 08/19/2017:Chest CTwith contrast on 08/19/2017 revealed left lower lobe 6 mm solid pulmonary nodule, decreased since 01/15/2017 chest CT. Stable 7 mm right lower lobe pulmonary nodule. There was no new or progressive metastatic disease in the chest.  09/03/2018: Pelvic [facility] at [facility] demonstrated a partial response of the primary tumor and extramural disease. Post treatment category ymrT T3b. There was an overall decrease in size in the previously seen perirectal node. Hypointense changes at the site of prior tumor focally contacedt buts does not definitely invade the LEFT levator ani. Tumor contacted, but does not definitely invade the internal anal sphincter. No evidence of residual or recurrent hepatic disease following microwave ablation. 02/17/2018:Abdomen and pelvic CTrevealed evolution of the ablation zone in segment 4A, now measuring 3.7 x 3.1 cm, decreased.There was no findings to suggest viable tumor.She is s/pabdominoperineal resection with left lower quadrant colostomy. There was no evidence of new/progressive metastatic disease.She is s/pleft lower lobe pulmonary wedge resection, incompletely  visualized. 03/04/2018:Chest CTrevealed a stable chest CT (compared to11/13/2018). The index right lower lobe lung nodule measured7 mm (unchanged). There was no new or progressive nodularity identified. There was continued decrease in size of ablation defect within segment 4a of the liver. 08/13/2019:Abdominaland pelvisCT [facility] of parastomal hernia with small bowel as well as multiple small bowel loops whichwere dilated.There was hepatic steatosis and post-treatment changes in the pelvis with persistent presacral soft tissue. There was a 3.5 cm infrarenal abdominal aortic aneurysm. 09/09/2019:Chest CT at [facility] no intrathoracic metastasis and no new or enlarging pulmonary nodules. 09/09/2019:Abdomen and pelvis CT at [facility] multiple subcentimeter hypodensities in the dome of the liver, which appeared new/more conspicuous from prior CT. There were not seen on the prior MRI. Further evaluation with MRI was recommended. CT evaluationwas limited due to background fatty liver and the heterogeneity/these lesions could have been secondary to fatty liver. There was unchanged soft tissue stranding and nodularity in the pelvis, with a few prominent more nodular areas. There were a few more prominent indeterminateareasand short-term follow up was recommended to ensure stability and exclude peritoneal disease. Further evaluation with MRI could have been helpful. 11/12/2019: Liver and pelvis MRIrevealedno findings to explain the patient's history of rising CEA. There was irregular presacral soft tissue with apparent scarring noted in the left paracentral pelviswas similar to prior studies and presumably treatment related. PET-CT could be used, as clinically warranted to exclude hypermetabolic disease within thatregion. There was an ablation defect identified in segment IV with minimal irregular enhancement along the anterosuperior margin, indeterminate. Attention  on follow-up recommended.There was a tiny cyst identified in the dome of the right liver without other findings to suggest metastatic disease in the hepatic parenchyma. 11/29/2019:PET scanrevealed 1.6 x 1.5 cmnodularthickeningat the resection margin (SUV 11.3)in the LEFT lower lobe. There was no evidence of hepatic metastasis. There was no evidence of local recurrence in pelvis. There was insufficiency fracture in the LEFT sacral ala  presumably related to radiation treatment. 02/23/2020:  Chest, abdomen and pelvis CT revealed a stable 2.6 x 1.7 cm partially calcified left infrahilar pulmonary nodule, hypermetabolic on PET scan and likely metastasis.  There were new no new or enlarging pulmonary nodules.  There were stable treated hepatic metastatic disease with no evidence of progression or local recurrence.  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  There is no evidence of incarceration or obstruction.  There was mild wall thickening of multiple loops of small bowel in the mid abdomen, possibly related to prior radiation therapy.  There was stable chronic sacral insufficiency fractures bilaterally.  There was a stable 3.7 cm fusiform infrarenal abdominal aortic aneurysm.  03/22/2020:  Abdomen and pelvis CT at [facility] revealed a high-grade small bowel obstruction. There were 2 separate loops of small bowel contained within the parastomal hernia. The first was more proximal small bowel which appeared dilated but nonobstructed. The second segment of included small bowel was more distal small bowel which appeared dilated entering into the herniaand collapsed exiting the hernia, consistent with point of obstruction.  There was a 3.7 cm infrarenal abdominal aortic aneurysm with extensive mural thrombus, similar in size compared with previous examination, although degree of mural thrombus had increased. Additional calcified atherosclerotic plaque was noted  throughout the abdominal aorta and its branches.  08/22/2020:  Chest CT with contrast revealed mild reduction in size of the left infrahilar peribronchovascular nodule (1.2 x 2.2 cm, previously 1.5 x 2.3 cm. There was patchy ground-glass opacity in the left upper lobe and lingula as well as the left lower lobe, favoring radiation pneumonitis given the patient's radiation therapy, atypical infection was a less likely differential diagnostic consideration. There was coronary, aortic arch, and branch vessel atherosclerotic vascular disease, old granulomatous disease, and ablation defect in the left hepatic lobe.   Appointment on 09/27/2020  Component Date Value Ref Range Status  . Folate 09/27/2020 44.0  >5.9 ng/mL Final   Performed at [facility], [address] [place], [place] [address]  . Sodium 09/27/2020 139  135 - 145 mmol/L Final  . Potassium 09/27/2020 4.4  3.5 - 5.1 mmol/L Final  . Chloride 09/27/2020 100  98 - 111 mmol/L Final  . CO2 09/27/2020 29  22 - 32 mmol/L Final  . Glucose, Bld 09/27/2020 106* 70 - 99 mg/dL Final   Glucose reference range applies only to samples taken after fasting for at least 8 hours.  . BUN 09/27/2020 12  8 - 23 mg/dL Final  . Creatinine, Ser 09/27/2020 0.70  0.44 - 1.00 mg/dL Final  . Calcium 09/27/2020 8.9  8.9 - 10.3 mg/dL Final  . GFR, Estimated 09/27/2020 >60  >60 mL/min Final   Comment: (NOTE) Calculated using the CKD-EPI Creatinine Equation (2021)   . Anion gap 09/27/2020 10  5 - 15 Final   Performed at [facility], [address], [place], [place] [address]  . WBC 09/27/2020 6.2  4.0 - 10.5 K/uL Final  . RBC 09/27/2020 4.07  3.87 - 5.11 MIL/uL Final  . Hemoglobin 09/27/2020 12.4  12.0 - 15.0 g/dL Final  . HCT 09/27/2020 38.2  36.0 - 46.0 % Final  . MCV 09/27/2020 93.9  80.0 - 100.0 fL Final  . MCH 09/27/2020 30.5  26.0 - 34.0 pg Final  . MCHC 09/27/2020 32.5  30.0 - 36.0 g/dL Final  . RDW 09/27/2020 13.4  11.5 - 15.5 %  Final  .  Platelets 09/27/2020 241  150 - 400 K/uL Final  . nRBC 09/27/2020 0.0  0.0 - 0.2 % Final  . Neutrophils Relative % 09/27/2020 77  % Final  . Neutro Abs 09/27/2020 4.7  1.7 - 7.7 K/uL Final  . Lymphocytes Relative 09/27/2020 12  % Final  . Lymphs Abs 09/27/2020 0.8  0.7 - 4.0 K/uL Final  . Monocytes Relative 09/27/2020 8  % Final  . Monocytes Absolute 09/27/2020 0.5  0.1 - 1.0 K/uL Final  . Eosinophils Relative 09/27/2020 2  % Final  . Eosinophils Absolute 09/27/2020 0.1  0.0 - 0.5 K/uL Final  . Basophils Relative 09/27/2020 0  % Final  . Basophils Absolute 09/27/2020 0.0  0.0 - 0.1 K/uL Final  . Immature Granulocytes 09/27/2020 1  % Final  . Abs Immature Granulocytes 09/27/2020 0.06  0.00 - 0.07 K/uL Final   Performed at [facility], [address] [place], [place] [address]    Assessment:  SHABRIA EGLEY is a 64 y.o. female with clinical stage T3N1M1rectal cancer. She presented with a 9 month history of rectal bleeding. Colonoscopyon 01/14/2017 revealed afrond-like/villous non-obstructing large mass in the rectum. The mass was non-circumferential. Biopsies revealed high grade dysplasia within an adenoma (? sampling error). CEAwas 31.6 on 01/15/2017.  PET scanon 01/27/2017 revealed intense metabolic activity associated rectal mass consists with primary rectal carcinoma. There was a hypermetabolic metastatic lesion to the central LEFT hepatic lobe. There was metabolic activity associated with small LEFT lower lobe nodule which was most consistent with pulmonary metastasis.  CT guided liver biopsyat [facility] on 02/07/2017 revealed malignant cells c/w metastatic colorectal adenocarcinoma. [facility] on 12/29/2019 [phone]) revealed MS-stable, 1 Muts/Mb tumor mutational burden; KRAS G12C, PTEN rearrangement intron 8, APC G1106*, T1427f*18, and NRAS wildtype.  There were no reportable mutations in BRAF and NRAS.     She received 2 cycles of FOLFOX  (02/18/2017 - 03/04/2017). She developed transient cold induced neuropathy. She developed wheezing and hypoxia with cycle #2 felt secondary to oxaliplatin. She received 4 cycles ofFOLFIRI(03/24/2017 - 04/22/2017) with Neulasta support. Diarrhea was controlled with Imodium.  She underwent CT guided microwave ablation of the liver lesionon 06/06/2017.   She began radiationon 06/18/2017. She receiveddaily XMelody Haverradiation. Radiation and Xeloda completed on 08/15/2017.  She underwentAPR with descending colostomy and VRAM flapon 10/24/2017. She underwent a VATS wedge resectionto her LEFT lung. Pathology from the lung demonstrateda 1 cmmetastatic intestinal type adenocarcinoma c/w metastasis from patient's known colorectal carcinoma.Pathology from the rectal resection revealed a 2.1 cm invasive moderately differentiated adenocarcinoma arising in a tubular adenoma with transmural invasion focally into the perirectal soft tissue. There was no lymphovascular or perineural invasion. Margins were negative. Zero of 20 lymph nodes were positive. Final diagnosiswas [id number] adenocarcinoma with metastasis to left lower lobe of the lung.  She has had post-operative pain and ostomy ischemia. She underwent surgical ostomy revisionon 11/28/2017. Pathologyshowed focal mucosal erosion and fat necrosis c/w ischemia. There was extensive underlying fat necrosis. No dysplasia or carcinoma was identified. She was treated with oral Augmentin and wound packing. She has been treated with oxycodone 5 mg po q 4 hrs prn, gabapentin 900 mg TID. She was treated with Bactrim on 01/18/2018. She was referred to local pain management clinic on 01/27/2018.  PET scanon 11/29/2019 showed local recurrenceof hypermetabolic nodular lung malignancy at the resection margin(1.6 x 1.5 cm; SUV 11.3)in the LEFT lower lobe. There was no evidence of hepatic metastasis. There was no evidence of local  recurrence in pelvis. There was insufficiency fracture in the LEFT sacral ala presumably related to radiation treatment.  Chest CTon 12/13/2019 revealed anenlarging partially calcified2.3 x 1.2 cmlesion in the medial aspect of the left lower lobe most compatible with a pulmonary metastasis given the hypermetabolism on recent PET-CT.There were several other tiny pulmonary nodules measuring 6 mm or less in size, some of whichwere new or increased in size compared to prior examinations.  She declined surgical resection.  Chest, abdomen and pelvis CT on 02/23/2020 revealed a stable 2.6 x 1.7 cm partially calcified left infrahilar pulmonary nodule, hypermetabolic on PET scan and likely metastasis.  There were new no new or enlarging pulmonary nodules.  There were stable treated hepatic metastatic disease with no evidence of progression or local recurrence.  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  There is no evidence of incarceration or obstruction.  There was mild wall thickening of multiple loops of small bowel in the mid abdomen, possibly related to prior radiation therapy.  There was stable chronic sacral insufficiency fractures bilaterally.  There was a stable 3.7 cm fusiform infrarenal abdominal aortic aneurysm.   She is s/p 1 cycle of FOLFIRI (03/08/2020) with Fulphila support.  Cycle #1 was notable for nausea, vomiting, and diarrhea.  She declined further chemotherapy.  She received IMRT (6000 cGy) to the left lung lesion from 05/10/2020 - 05/22/2020.   Chest CT with contrast on 08/22/2020 revealed mild reduction in size of the left infrahilar peribronchovascular nodule (1.2 x 2.2 cm, previously 1.5 x 2.3 cm). There was patchy ground-glass opacity in the left upper lobe and lingula as well as the left lower lobe, favoring radiation pneumonitis.  CEAhas been followed: 31.6 on 01/15/2017, 38.1 on 02/18/2017, 25.1 on 03/18/2017, 11.8 on  04/08/2017, 9.2 on 04/22/2017, 9.5 on 05/20/2017, 9.6 on 06/19/2017, 3.7 on 02/18/2018,5.8 on 06/26/2018, 10.5 on 11/20/2018, 19.5 on 03/26/2019, 34.0 on 07/21/2019, 46.4 on 09/09/2019, 54.4 on 10/28/2019, 108.0 on 02/10/2020, 121.0 on 03/08/2020, 49.8 on 04/13/2020, 76.7 on 05/29/2020, 40.9 on 06/13/2020, and 7.7 on 08/07/2020.  Colonoscopy at [facility] 09/13/2019 did not appear malignant with no adenomatous (granulation tissue). There was diverticulosis in the descending colon, normal mucosa in the entire examined colon and the examined portion of the ileum. There was a 9 mmlipoma in the transverse colon anda4 mm polyp at the surgical stoma.   She has a B12 deficiency.[id number] (low)on 12/03/2019. She restarted B12 injections on 12/13/2019 (last 08/28/2020).  She has folate deficiency.  Folate was 5.8 (low) on 12/03/2019 and 44.0 on 09/27/2020.  She is on folic acid.    She has chronic hypokalemia.  She is on potassium 50 meq po q day.  [facility] for a smallbowel obstruction. She had severe abdominal pain with associated nausea and vomiting. Abdominal and pelvicCT revealedmultiple small bowel loops whichwere dilated.Obstruction resolved spontaneously.  She was admitted to [facility] from 03/22/2020 - 03/28/2020. She presented with an incarcerated hernia and underwent parastomal hernia repair with no bowel resection on 03/22/2020.  There was a 3.7 cm infrarenal abdominal aortic aneurysm with extensive mural thrombus, similar in size compared with previous examination, although degree of mural thrombus had increased.  She received the first dose of the Moderna COVID-19 vaccine on 08/28/2020.  Symptomatically, she has ongoing respiratory symptoms felt secondary to radiation.  She has been on steroids.  She coughs up "more stuff" with her inhaler.  Exam is stable.  Plan: 1.   Labs  today:  CBC with diff, BMP, folate. 2.Stage IV rectal  carcinoma Shereceived6cycles of FOLFOX(02/18/2017 -04/22/2017).  She underwent CT guided microwave ablation of the liver lesionon 06/06/2017.  She receivedradiationand daily Xeloda (06/18/2017-08/15/2017). She underwentAPR with descending colostomy and VRAM flapon 10/24/2017.  She underwent a VATS wedge resection of her left lung. 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  She declined left lower lobe lobectomy.  She received 1 cycle of FOLFIRI (03/08/2020).                She declined further chemotherapy or surgery.  She completed SBRT to the left infrahilar nodule on 05/22/2020.  CEA was 40.9 on 06/13/2020 and 7.7 on 08/07/2020.   Chest CT on 08/22/2020 revealed the left infrahilar nodule was slightly smaller.  Symptomatically, she is doing fair.  Discuss plans for follow-up imaging in 10/2020. 3. B12 and folate deficiency [id number] (low)on 12/03/2019 and folate 5.8 (low) on 12/03/2019. She receives B12 monthly (last 08/28/2020).             Folate is 44.0 today.  B12 today and monthly x6.            4.Hypokalemia Potassium4.4 today. She is on potassium 50 mEq p.o. daily  Etiology is felt secondary to HCTZ.  She denies any change in stool output. 5.   B12 today and monthly x 6. 6.   RN: Call patient with lab results and any adjustment in potassium supplementation. 7.   Chest, abdomen, and pelvic CT on 10/26/2019. 8.   RTC after CT scan for MD assessment, labs (CBC with diff, CMP, Mg, CEA- can be drawn prior to CT), and review of imaging.  I discussed the assessment and treatment plan with the patient.  The patient was provided an opportunity to ask questions and all were answered.  The patient agreed with the plan and demonstrated an understanding of the instructions.  The  patient was advised to call back if the symptoms worsen or if the condition fails to improve as anticipated.    Lequita Asal, MD, PhD    09/27/2020, 11:14 AM

## 2020-09-27 ENCOUNTER — Inpatient Hospital Stay: Payer: Medicare HMO

## 2020-09-27 ENCOUNTER — Inpatient Hospital Stay (HOSPITAL_BASED_OUTPATIENT_CLINIC_OR_DEPARTMENT_OTHER): Payer: Medicare HMO | Admitting: Hematology and Oncology

## 2020-09-27 ENCOUNTER — Other Ambulatory Visit: Payer: Self-pay

## 2020-09-27 ENCOUNTER — Encounter: Payer: Self-pay | Admitting: Hematology and Oncology

## 2020-09-27 VITALS — BP 121/54 | HR 71 | Temp 97.7°F | Resp 18 | Ht 66.0 in | Wt 186.2 lb

## 2020-09-27 DIAGNOSIS — E538 Deficiency of other specified B group vitamins: Secondary | ICD-10-CM

## 2020-09-27 DIAGNOSIS — E876 Hypokalemia: Secondary | ICD-10-CM

## 2020-09-27 DIAGNOSIS — C7802 Secondary malignant neoplasm of left lung: Secondary | ICD-10-CM

## 2020-09-27 DIAGNOSIS — F17210 Nicotine dependence, cigarettes, uncomplicated: Secondary | ICD-10-CM | POA: Diagnosis not present

## 2020-09-27 DIAGNOSIS — C20 Malignant neoplasm of rectum: Secondary | ICD-10-CM

## 2020-09-27 LAB — BASIC METABOLIC PANEL
Anion gap: 10 (ref 5–15)
BUN: 12 mg/dL (ref 8–23)
CO2: 29 mmol/L (ref 22–32)
Calcium: 8.9 mg/dL (ref 8.9–10.3)
Chloride: 100 mmol/L (ref 98–111)
Creatinine, Ser: 0.7 mg/dL (ref 0.44–1.00)
GFR, Estimated: >60 mL/min (ref >60)
Glucose, Bld: 106 mg/dL — ABNORMAL HIGH (ref 70–99)
Potassium: 4.4 mmol/L (ref 3.5–5.1)
Sodium: 139 mmol/L (ref 135–145)

## 2020-09-27 LAB — CBC WITH DIFFERENTIAL/PLATELET
Abs Immature Granulocytes: 0.06 10*3/uL (ref 0.00–0.07)
Basophils Absolute: 0 10*3/uL (ref 0.0–0.1)
Basophils Relative: 0 %
Eosinophils Absolute: 0.1 10*3/uL (ref 0.0–0.5)
Eosinophils Relative: 2 %
HCT: 38.2 % (ref 36.0–46.0)
Hemoglobin: 12.4 g/dL (ref 12.0–15.0)
Immature Granulocytes: 1 %
Lymphocytes Relative: 12 %
Lymphs Abs: 0.8 10*3/uL (ref 0.7–4.0)
MCH: 30.5 pg (ref 26.0–34.0)
MCHC: 32.5 g/dL (ref 30.0–36.0)
MCV: 93.9 fL (ref 80.0–100.0)
Monocytes Absolute: 0.5 10*3/uL (ref 0.1–1.0)
Monocytes Relative: 8 %
Neutro Abs: 4.7 10*3/uL (ref 1.7–7.7)
Neutrophils Relative %: 77 %
Platelets: 241 10*3/uL (ref 150–400)
RBC: 4.07 MIL/uL (ref 3.87–5.11)
RDW: 13.4 % (ref 11.5–15.5)
WBC: 6.2 10*3/uL (ref 4.0–10.5)
nRBC: 0 % (ref 0.0–0.2)

## 2020-09-27 LAB — FOLATE: Folate: 44 ng/mL (ref >5.9)

## 2020-09-27 MED ORDER — CYANOCOBALAMIN 1000 MCG/ML IJ SOLN
INTRAMUSCULAR | Status: AC
  Filled 2020-09-27: qty 1

## 2020-09-27 MED ORDER — CYANOCOBALAMIN 1000 MCG/ML IJ SOLN
1000.0000 ug | Freq: Once | INTRAMUSCULAR | Status: AC
  Administered 2020-09-27: 1000 ug via INTRAMUSCULAR

## 2020-09-27 NOTE — Progress Notes (Signed)
No new changes noted today 

## 2020-09-28 ENCOUNTER — Telehealth: Payer: Self-pay

## 2020-09-28 NOTE — Telephone Encounter (Signed)
Patient is aware of date/time of covid test prior to PFT.  

## 2020-10-01 ENCOUNTER — Other Ambulatory Visit: Payer: Self-pay | Admitting: Hematology and Oncology

## 2020-10-01 DIAGNOSIS — K2100 Gastro-esophageal reflux disease with esophagitis, without bleeding: Secondary | ICD-10-CM

## 2020-10-02 ENCOUNTER — Other Ambulatory Visit
Admission: RE | Admit: 2020-10-02 | Discharge: 2020-10-02 | Disposition: A | Discharge status: Home / Self Care | Payer: Medicare HMO | Source: Ambulatory Visit | Attending: Pulmonary Disease | Admitting: Pulmonary Disease

## 2020-10-02 ENCOUNTER — Other Ambulatory Visit: Payer: Self-pay

## 2020-10-02 DIAGNOSIS — Z20822 Contact with and (suspected) exposure to covid-19: Secondary | ICD-10-CM | POA: Insufficient documentation

## 2020-10-02 DIAGNOSIS — Z01812 Encounter for preprocedural laboratory examination: Secondary | ICD-10-CM | POA: Diagnosis present

## 2020-10-03 ENCOUNTER — Other Ambulatory Visit: Payer: Self-pay

## 2020-10-03 ENCOUNTER — Ambulatory Visit: Payer: Medicare HMO | Attending: Pulmonary Disease

## 2020-10-03 DIAGNOSIS — R0602 Shortness of breath: Secondary | ICD-10-CM | POA: Diagnosis present

## 2020-10-03 DIAGNOSIS — C189 Malignant neoplasm of colon, unspecified: Secondary | ICD-10-CM | POA: Insufficient documentation

## 2020-10-03 DIAGNOSIS — F17210 Nicotine dependence, cigarettes, uncomplicated: Secondary | ICD-10-CM | POA: Diagnosis not present

## 2020-10-03 LAB — SARS CORONAVIRUS 2 (TAT 6-24 HRS): SARS Coronavirus 2: NEGATIVE

## 2020-10-03 MED ORDER — ALBUTEROL SULFATE (2.5 MG/3ML) 0.083% IN NEBU
2.5000 mg | INHALATION_SOLUTION | Freq: Once | RESPIRATORY_TRACT | Status: AC
  Administered 2020-10-03: 13:00:00 2.5 mg via RESPIRATORY_TRACT
  Filled 2020-10-03: qty 3

## 2020-10-09 ENCOUNTER — Other Ambulatory Visit: Payer: Self-pay | Admitting: Hematology and Oncology

## 2020-10-09 DIAGNOSIS — E876 Hypokalemia: Secondary | ICD-10-CM

## 2020-10-12 LAB — PULMONARY FUNCTION TEST ARMC ONLY
DL/VA % pred: 112 %
DL/VA: 4.62 ml/min/mmHg/L
DLCO unc % pred: 81 %
DLCO unc: 17.38 ml/min/mmHg
FEF 25-75 Post: 1.6 L/sec
FEF 25-75 Pre: 0.61 L/sec
FEF2575-%Change-Post: 162 %
FEF2575-%Pred-Post: 70 %
FEF2575-%Pred-Pre: 26 %
FEV1-%Change-Post: 77 %
FEV1-%Pred-Post: 54 %
FEV1-%Pred-Pre: 31 %
FEV1-Post: 1.45 L
FEV1-Pre: 0.82 L
FEV1FVC-%Change-Post: 47 %
FEV1FVC-%Pred-Pre: 68 %
FEV6-%Change-Post: 21 %
FEV6-%Pred-Post: 56 %
FEV6-%Pred-Pre: 46 %
FEV6-Post: 1.86 L
FEV6-Pre: 1.53 L
FEV6FVC-%Change-Post: 1 %
FEV6FVC-%Pred-Post: 103 %
FEV6FVC-%Pred-Pre: 102 %
FVC-%Change-Post: 19 %
FVC-%Pred-Post: 53 %
FVC-%Pred-Pre: 45 %
FVC-Post: 1.86 L
Post FEV1/FVC ratio: 78 %
Post FEV6/FVC ratio: 100 %
Pre FEV1/FVC ratio: 53 %
Pre FEV6/FVC Ratio: 99 %
RV % pred: 135 %
RV: 2.94 L
TLC % pred: 91 %
TLC: 4.91 L

## 2020-10-13 ENCOUNTER — Inpatient Hospital Stay: Payer: Medicare HMO

## 2020-10-25 ENCOUNTER — Ambulatory Visit: Payer: Medicare HMO | Admitting: Pulmonary Disease

## 2020-10-25 ENCOUNTER — Inpatient Hospital Stay: Payer: Medicare HMO

## 2020-10-25 ENCOUNTER — Ambulatory Visit: Admission: RE | Admit: 2020-10-25 | Payer: Medicare HMO | Source: Ambulatory Visit

## 2020-10-26 ENCOUNTER — Inpatient Hospital Stay: Payer: Medicare HMO | Attending: Hematology and Oncology | Admitting: Hematology and Oncology

## 2020-10-26 ENCOUNTER — Other Ambulatory Visit: Payer: Self-pay | Admitting: Hematology and Oncology

## 2020-10-26 DIAGNOSIS — K2100 Gastro-esophageal reflux disease with esophagitis, without bleeding: Secondary | ICD-10-CM

## 2020-10-30 ENCOUNTER — Inpatient Hospital Stay: Payer: Medicare HMO

## 2020-10-31 ENCOUNTER — Inpatient Hospital Stay: Payer: Medicare HMO | Attending: Hematology and Oncology

## 2020-10-31 ENCOUNTER — Other Ambulatory Visit: Payer: Self-pay

## 2020-10-31 ENCOUNTER — Ambulatory Visit
Admission: RE | Admit: 2020-10-31 | Discharge: 2020-10-31 | Disposition: A | Discharge status: Home / Self Care | Payer: Medicare HMO | Source: Ambulatory Visit | Attending: Hematology and Oncology | Admitting: Hematology and Oncology

## 2020-10-31 DIAGNOSIS — E876 Hypokalemia: Secondary | ICD-10-CM

## 2020-10-31 DIAGNOSIS — C20 Malignant neoplasm of rectum: Secondary | ICD-10-CM | POA: Diagnosis present

## 2020-10-31 DIAGNOSIS — E538 Deficiency of other specified B group vitamins: Secondary | ICD-10-CM

## 2020-10-31 DIAGNOSIS — C7802 Secondary malignant neoplasm of left lung: Secondary | ICD-10-CM

## 2020-10-31 LAB — CBC WITH DIFFERENTIAL/PLATELET
Abs Immature Granulocytes: 0.04 10*3/uL (ref 0.00–0.07)
Basophils Absolute: 0 10*3/uL (ref 0.0–0.1)
Basophils Relative: 0 %
Eosinophils Absolute: 0.1 10*3/uL (ref 0.0–0.5)
Eosinophils Relative: 1 %
HCT: 38.3 % (ref 36.0–46.0)
Hemoglobin: 12.6 g/dL (ref 12.0–15.0)
Immature Granulocytes: 1 %
Lymphocytes Relative: 13 %
Lymphs Abs: 0.8 10*3/uL (ref 0.7–4.0)
MCH: 30.1 pg (ref 26.0–34.0)
MCHC: 32.9 g/dL (ref 30.0–36.0)
MCV: 91.6 fL (ref 80.0–100.0)
Monocytes Absolute: 0.5 10*3/uL (ref 0.1–1.0)
Monocytes Relative: 9 %
Neutro Abs: 4.7 10*3/uL (ref 1.7–7.7)
Neutrophils Relative %: 76 %
Platelets: 256 10*3/uL (ref 150–400)
RBC: 4.18 MIL/uL (ref 3.87–5.11)
RDW: 13.8 % (ref 11.5–15.5)
WBC: 6.1 10*3/uL (ref 4.0–10.5)
nRBC: 0 % (ref 0.0–0.2)

## 2020-10-31 LAB — COMPREHENSIVE METABOLIC PANEL
ALT: 11 U/L (ref 0–44)
AST: 14 U/L — ABNORMAL LOW (ref 15–41)
Albumin: 3.7 g/dL (ref 3.5–5.0)
Alkaline Phosphatase: 77 U/L (ref 38–126)
Anion gap: 12 (ref 5–15)
BUN: 7 mg/dL — ABNORMAL LOW (ref 8–23)
CO2: 27 mmol/L (ref 22–32)
Calcium: 8.7 mg/dL — ABNORMAL LOW (ref 8.9–10.3)
Chloride: 97 mmol/L — ABNORMAL LOW (ref 98–111)
Creatinine, Ser: 0.72 mg/dL (ref 0.44–1.00)
GFR, Estimated: >60 mL/min (ref >60)
Glucose, Bld: 113 mg/dL — ABNORMAL HIGH (ref 70–99)
Potassium: 3.4 mmol/L — ABNORMAL LOW (ref 3.5–5.1)
Sodium: 136 mmol/L (ref 135–145)
Total Bilirubin: 0.4 mg/dL (ref 0.3–1.2)
Total Protein: 7.1 g/dL (ref 6.5–8.1)

## 2020-10-31 LAB — MAGNESIUM: Magnesium: 1.8 mg/dL (ref 1.7–2.4)

## 2020-10-31 MED ORDER — IOHEXOL 300 MG/ML  SOLN
100.0000 mL | Freq: Once | INTRAMUSCULAR | Status: AC | PRN
  Administered 2020-10-31: 100 mL via INTRAVENOUS

## 2020-10-31 MED ORDER — CYANOCOBALAMIN 1000 MCG/ML IJ SOLN
1000.0000 ug | Freq: Once | INTRAMUSCULAR | Status: AC
  Administered 2020-10-31: 1000 ug via INTRAMUSCULAR
  Filled 2020-10-31: qty 1

## 2020-10-31 MED ORDER — HEPARIN SOD (PORK) LOCK FLUSH 100 UNIT/ML IV SOLN
500.0000 [IU] | Freq: Once | INTRAVENOUS | Status: AC | PRN
  Administered 2020-10-31: 500 [IU]
  Filled 2020-10-31: qty 5

## 2020-10-31 NOTE — Progress Notes (Signed)
Patient stable at discharge.

## 2020-10-31 NOTE — Progress Notes (Signed)
[facility]  [address], [address] [place], [place] [address] Phone: [phone]  Fax: [phone]   Telephone Visit:  11/02/2020  Referring physician: Loni Muse, MD  I connected with Tamara Hall on 11/02/2020 at 1:36 PM by videoconferencing and verified that I was speaking with the correct person using 2 identifiers.  The patient was at home.  I discussed the limitations, risk, security and privacy concerns of performing an evaluation and management service by videoconferencing and the availability of in person appointments.  I also discussed with the patient that there may be a patient responsible charge related to this service.  The patient expressed understanding and agreed to proceed.   Chief Complaint: Tamara Hall is a 65 y.o. 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  HPI: The patient was last seen in the medical oncology clinic on 09/27/2020. At that time, she had ongoing respiratory symptoms felt secondary to radiation.  She had been on steroids.  She coughed up "more stuff" with her inhaler.  Exam was stable. Hematocrit was 38.2, hemoglobin 12.4, platelets 241,000, WBC 6,200. BMP was normal. Folate was 44.0. She received a vitamin B12 injection.  Pulmonary function test on 10/03/2020 showed severe COPD with a very prominent asthma component. She was to keep taking Breztri.  Chest, abdomen, and pelvis CT on 10/31/2020 revealed stable post treatment changes in pelvis and abdomen. Previously seen left lower quadrant parastomal hernia was no longer visualized. There were no acute findings. There was a stable 6 mm right lower lobe pulmonary nodule. There was interval improvement in multifocal opacities in left upper and lower lobes, with several new areas of ground-glass opacity in right upper and middle lobes. These findings favored a waxing and  waning inflammatory process. Recommended continued attention on follow-up CT. There was aortic atherosclerosis.  She received a vitamin B12 injection on 10/31/2020.  During the interim, the patient has felt good. She is using a Breztri inhaler BID and an Albuterol inhaler q 6 hours prn. She reports wheezing and coughing up phlegm. She struggles to breathe when she lays on her left side. She denies bone pain and chest pain. Her constipation has resolved.  She has been taking 5 potassium pills (10 mEq) per day for a total of 50 mg daily. She needs a refill. She takes folic acid 1 mg.  She needs to call and reschedule her appointment with Dr. Patsey Berthold.   Past Medical History:  Diagnosis Date  . Cancer ([place])    colon, with mets to liver and left lung  . Hypertension   . Pneumonia    20+ years ago    Past Surgical History:  Procedure Laterality Date  . ABDOMINAL HYSTERECTOMY    . BREAST SURGERY    . COLON SURGERY  10/2017   [facility]  . COLONOSCOPY WITH PROPOFOL N/A 01/14/2017   Procedure: COLONOSCOPY WITH PROPOFOL;  Surgeon: Lucilla Lame, MD;  Location: [facility] END[facility];  Service: Endoscopy;  Laterality: N/A;  . ELBOW SURGERY Right    Has had 4 surgeries on right elbow.  Marland Kitchen HAND SURGERY Right 2008  . JOINT REPLACEMENT    . LIVER BIOPSY  10/2017  . lung wedge resection  10/2017  . PORTA CATH INSERTION N/A 01/29/2017   Procedure: Glori Luis Cath Insertion;  Surgeon: Algernon Huxley, MD;  Location: [facility];  Service: Cardiovascular;  Laterality: N/A;  .  TONSILLECTOMY      Family History  Problem Relation Age of Onset  . Lung cancer Mother 79  . Esophageal cancer Father 18  . Brain cancer Maternal Grandmother     Social History:  reports that she has been smoking cigarettes. She has a 80.00 pack-year smoking history. She has never used smokeless tobacco. She reports that she does not drink alcohol and does not use drugs.She lives in [place]. She has 3 children (ages 63, 19, and 43). She  smoked 2 packs a day. She started smoking at age 59. She initially stopped smoking on 10/24/2017. She now smokes 3 cigarettes a day and is trying to quit. 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 [name] is her significant other. The patient is alone today.   Participants in the patient's visit and their role in the encounter included the patient and Tawni Carnes, RN today.  The intake visit was provided by Tawni Carnes, RN.  Allergies: No Known Allergies  Current Medications: Current Outpatient Medications  Medication Sig Dispense Refill  . albuterol (VENTOLIN HFA) 108 (90 Base) MCG/ACT inhaler Inhale 1-2 puffs into the lungs every 6 (six) hours as needed for wheezing or shortness of breath.     Marland Kitchen amLODipine (NORVASC) 5 MG tablet Take 5 mg by mouth daily.    . baclofen (LIORESAL) 10 MG tablet Take 10 mg by mouth 3 (three) times daily.    . Budeson-Glycopyrrol-Formoterol (BREZTRI AEROSPHERE) 160-9-4.8 MCG/ACT AERO Inhale 2 puffs into the lungs in the morning and at bedtime. 10.7 g 0  . Budeson-Glycopyrrol-Formoterol (BREZTRI AEROSPHERE) 160-9-4.8 MCG/ACT AERO Inhale 2 puffs into the lungs in the morning and at bedtime. 5.9 g 0  . Calcium Carbonate (CALCIUM 500 PO) Take 1 tablet by mouth daily.    . folic acid (FOLVITE) 1 MG tablet TAKE 1 TABLET BY MOUTH EVERY DAY 90 tablet 1  . gabapentin (NEURONTIN) 300 MG capsule Take 1,200 mg by mouth 3 (three) times daily.     Marland Kitchen gabapentin (NEURONTIN) 300 MG capsule Take by mouth.    . hydrochlorothiazide (HYDRODIURIL) 25 MG tablet Take 25 mg by mouth daily.    Marland Kitchen HYDROcodone-acetaminophen (NORCO) 10-325 MG tablet SMARTSIG:1 Tablet(s) By Mouth 4-5 Times Daily    . nortriptyline (PAMELOR) 50 MG capsule Take 50 mg by mouth at bedtime.    Marland Kitchen omeprazole (PRILOSEC) 20 MG capsule TAKE 1 CAPSULE BY MOUTH EVERY DAY 30 capsule 1  . Ostomy Supplies (PREMIER DRAINABLE POUCH 64MM) Pouch MISC     . potassium  chloride (KLOR-CON M10) 10 MEQ tablet TAKE 1 TABLET (10 MEQ TOTAL) BY MOUTH 5 (FIVE) TIMES DAILY. 150 tablet 0  . predniSONE (STERAPRED UNI-PAK 21 TAB) 10 MG (21) TBPK tablet Take as directed in the package (Patient not taking: Reported on 11/02/2020) 21 each 0   No current facility-administered medications for this visit.    Review of Systems  Constitutional: Negative for chills, diaphoresis, fever and malaise/fatigue. Weight loss: no new weight.  HENT: Negative.  Negative for congestion, ear discharge, ear pain, hearing loss, nosebleeds, sinus pain, sore throat and tinnitus.   Eyes: Negative.  Negative for blurred vision, double vision, photophobia and pain.  Respiratory: Positive for cough, sputum production (productive with inhaler), shortness of breath and wheezing. Negative for hemoptysis.   Cardiovascular: Negative.  Negative for chest pain, palpitations, orthopnea, leg swelling and PND.  Gastrointestinal: Negative for abdominal pain, blood in stool, constipation,  diarrhea, heartburn, melena, nausea and vomiting.  Genitourinary: Negative.  Negative for dysuria, frequency, hematuria and urgency.  Musculoskeletal: Negative.  Negative for back pain, falls, joint pain, myalgias and neck pain.  Skin: Negative.  Negative for itching and rash.  Neurological: Negative.  Negative for dizziness, tingling, sensory change, speech change, focal weakness, weakness and headaches.  Endo/Heme/Allergies: Negative.  Does not bruise/bleed easily.  Psychiatric/Behavioral: Negative.  Negative for depression and memory loss. The patient is not nervous/anxious and does not have insomnia.   All other systems reviewed and are negative.  Performance status (ECOG): 1  Vitals There were no vitals taken for this visit.   Imaging studies: 01/13/2017:Abdomen and pelvic CTrevealed irregular thickening of the rectum suspicious for carcinoma. There was a 1.6 cm hypoattenuating lesion in the right hepatic lobe  worrisome for metastatic disease. 01/15/2017:Chest CT revealed a 9 mm noncalcified left lower lobe pulmonary nodule (new). 01/16/2017:Pelvic MRIrevealed rectal adenocarcinoma T3N1. There was extension beyond the muscularis propria (17 mm). There were mesorectal nodes >= 5 mm. There was adenopathy on the left side of the mesorectum, including a 9 mm lesion. The distance from the tumor to the anal sphincter was 8 mm. 01/27/2017:PET scanrevealed intense metabolic activity associated rectal mass consists with primary rectal carcinoma. There was a hypermetabolic metastatic lesion to the central LEFT hepatic lobe. There was metabolic activity associated with small LEFT lower lobe nodule which was most consistent with pulmonary metastasis. 01/31/2017:Liver MRIrevealed a 2.6 x 2.7 x 2.1 cm lesion in segment VIII of the liver near the junction with segment IVa c/w a metastatic lesion. There was a 1.1 x 0.8 cm suspected metastatic lesion peripherally in segment VI of the liver.  05/16/2017:Abdomen and pelvic CTrevealed persistent but decreased asymmetric wall thickening in the rectum. The medial segment left liver lesion was smaller (2.7 x 2.6 cm to 1.9 x 1.6 cm). A tiny lesion seen in segment VI of the liver on previous MRI was not evident on today's CT scan. The left lower lobe pulmonary nodule was smaller (9 mm to 6 mm). There was no new or progressive findings in the abdomen and pelvis. 08/19/2017:Chest CTwith contrast on 08/19/2017 revealed left lower lobe 6 mm solid pulmonary nodule, decreased since 01/15/2017 chest CT. Stable 7 mm right lower lobe pulmonary nodule. There was no new or progressive metastatic disease in the chest.  09/03/2018: Pelvic MRI at [facility] demonstrated a partial response of the primary tumor and extramural disease. Post treatment category ymrT T3b. There was an overall decrease in size in the previously seen perirectal node. Hypointense changes at the site of  prior tumor focally contacedt buts does not definitely invade the LEFT levator ani. Tumor contacted, but does not definitely invade the internal anal sphincter. No evidence of residual or recurrent hepatic disease following microwave ablation. 02/17/2018:Abdomen and pelvic CTrevealed evolution of the ablation zone in segment 4A, now measuring 3.7 x 3.1 cm, decreased.There was no findings to suggest viable tumor.She is s/pabdominoperineal resection with left lower quadrant colostomy. There was no evidence of new/progressive metastatic disease.She is s/pleft lower lobe pulmonary wedge resection, incompletely visualized. 03/04/2018:Chest CTrevealed a stable chest CT (compared to11/13/2018). The index right lower lobe lung nodule measured7 mm (unchanged). There was no new or progressive nodularity identified. There was continued decrease in size of ablation defect within segment 4a of the liver. 08/13/2019:[facility]dominaland [facility] of parastomal hernia with small bowel as well as multiple small bowel loops whichwere dilated.There was hepatic steatosis and post-treatment changes in the  pelvis with persistent presacral soft tissue. There was a 3.5 cm infrarenal abdominal aortic aneurysm. 09/09/2019:Chest CT at [facility] no intrathoracic metastasis and no new or enlarging pulmonary nodules. 09/09/2019:Abdomen and pelvis CT at [facility] multiple subcentimeter hypodensities in the dome of the liver, which appeared new/more conspicuous from prior CT. There were not seen on the prior MRI. Further evaluation with MRI was recommended. CT evaluationwas limited due to background fatty liver and the heterogeneity/these lesions could have been secondary to fatty liver. There was unchanged soft tissue stranding and nodularity in the pelvis, with a few prominent more nodular areas. There were a few more prominent indeterminateareasand short-term follow up was  recommended to ensure stability and exclude peritoneal disease. Further evaluation with MRI could have been helpful. 11/12/2019: Liver and pelvis MRIrevealedno findings to explain the patient's history of rising CEA. There was irregular presacral soft tissue with apparent scarring noted in the left paracentral pelviswas similar to prior studies and presumably treatment related. PET-CT could be used, as clinically warranted to exclude hypermetabolic disease within thatregion. There was an ablation defect identified in segment IV with minimal irregular enhancement along the anterosuperior margin, indeterminate. Attention on follow-up recommended.There was a tiny cyst identified in the dome of the right liver without other findings to suggest metastatic disease in the hepatic parenchyma. 11/29/2019:PET scanrevealed 1.6 x 1.5 cmnodularthickeningat the resection margin (SUV 11.3)in the LEFT lower lobe. There was no evidence of hepatic metastasis. There was no evidence of local recurrence in pelvis. There was insufficiency fracture in the LEFT sacral ala presumably related to radiation treatment. 02/23/2020:  Chest, abdomen and pelvis CT revealed a stable 2.6 x 1.7 cm partially calcified left infrahilar pulmonary nodule, hypermetabolic on PET scan and likely metastasis.  There were new no new or enlarging pulmonary nodules.  There were stable treated hepatic metastatic disease with no evidence of progression or local recurrence.  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  There is no evidence of incarceration or obstruction.  There was mild wall thickening of multiple loops of small bowel in the mid abdomen, possibly related to prior radiation therapy.  There was stable chronic sacral insufficiency fractures bilaterally.  There was a stable 3.7 cm fusiform infrarenal abdominal aortic aneurysm.  03/22/2020:  Abdomen and pelvis CT at [facility] revealed a  high-grade small bowel obstruction. There were 2 separate loops of small bowel contained within the parastomal hernia. The first was more proximal small bowel which appeared dilated but nonobstructed. The second segment of included small bowel was more distal small bowel which appeared dilated entering into the herniaand collapsed exiting the hernia, consistent with point of obstruction.  There was a 3.7 cm infrarenal abdominal aortic aneurysm with extensive mural thrombus, similar in size compared with previous examination, although degree of mural thrombus had increased. Additional calcified atherosclerotic plaque was noted throughout the abdominal aorta and its branches.  08/22/2020:  Chest CT with contrast revealed mild reduction in size of the left infrahilar peribronchovascular nodule (1.2 x 2.2 cm, previously 1.5 x 2.3 cm. There was patchy ground-glass opacity in the left upper lobe and lingula as well as the left lower lobe, favoring radiation pneumonitis given the patient's radiation therapy, atypical infection was a less likely differential diagnostic consideration. There was coronary, aortic arch, and branch vessel atherosclerotic vascular disease, old granulomatous disease, and ablation defect in the left hepatic lobe. 10/31/2020:  Chest, abdomen, and pelvis CT revealed stable post treatment changes in pelvis and  abdomen. Previously seen left lower quadrant parastomal hernia was no longer visualized. There were no acute findings. There was a stable 6 mm right lower lobe pulmonary nodule. There was interval improvement in multifocal opacities in left upper and lower lobes, with several new areas of ground-glass opacity in right upper and middle lobes. These findings favored a waxing and waning inflammatory process. Recommended continued attention on follow-up CT. There was aortic atherosclerosis.   Infusion on 10/31/2020  Component Date Value Ref Range Status  . CEA 10/31/2020 9.5* 0.0 - 4.7 ng/mL  Final   Comment: (NOTE)                             Nonsmokers          <3.9                             Smokers             <5.6 [employer] Electrochemiluminescence Immunoassay (ECLIA) Values obtained with different assay methods or kits cannot be used interchangeably.  Results cannot be interpreted as absolute evidence of the presence or absence of malignant disease. Performed At: [facility] [place], [place] [id number] Rush Farmer MD PP:[id number]   . Magnesium 10/31/2020 1.8  1.7 - 2.4 mg/dL Final   Performed at [facility], [address], [place], [place] [address]  . Sodium 10/31/2020 136  135 - 145 mmol/L Final  . Potassium 10/31/2020 3.4* 3.5 - 5.1 mmol/L Final  . Chloride 10/31/2020 97* 98 - 111 mmol/L Final  . CO2 10/31/2020 27  22 - 32 mmol/L Final  . Glucose, Bld 10/31/2020 113* 70 - 99 mg/dL Final   Glucose reference range applies only to samples taken after fasting for at least 8 hours.  . BUN 10/31/2020 7* 8 - 23 mg/dL Final  . Creatinine, Ser 10/31/2020 0.72  0.44 - 1.00 mg/dL Final  . Calcium 10/31/2020 8.7* 8.9 - 10.3 mg/dL Final  . Total Protein 10/31/2020 7.1  6.5 - 8.1 g/dL Final  . Albumin 10/31/2020 3.7  3.5 - 5.0 g/dL Final  . AST 10/31/2020 14* 15 - 41 U/L Final  . ALT 10/31/2020 11  0 - 44 U/L Final  . Alkaline Phosphatase 10/31/2020 77  38 - 126 U/L Final  . Total Bilirubin 10/31/2020 0.4  0.3 - 1.2 mg/dL Final  . GFR, Estimated 10/31/2020 >60  >60 mL/min Final   Comment: (NOTE) Calculated using the CKD-EPI Creatinine Equation (2021)   . Anion gap 10/31/2020 12  5 - 15 Final   Performed at [facility], [address], [place], [place] [address]  . WBC 10/31/2020 6.1  4.0 - 10.5 K/uL Final  . RBC 10/31/2020 4.18  3.87 - 5.11 MIL/uL Final  . Hemoglobin 10/31/2020 12.6  12.0 - 15.0 g/dL Final  . HCT 10/31/2020 38.3  36.0 - 46.0 % Final  . MCV 10/31/2020 91.6  80.0 - 100.0 fL Final  . MCH  10/31/2020 30.1  26.0 - 34.0 pg Final  . MCHC 10/31/2020 32.9  30.0 - 36.0 g/dL Final  . RDW 10/31/2020 13.8  11.5 - 15.5 % Final  . Platelets 10/31/2020 256  150 - 400 K/uL Final  . nRBC 10/31/2020 0.0  0.0 - 0.2 % Final  . Neutrophils Relative % 10/31/2020 76  % Final  . Neutro Abs 10/31/2020  4.7  1.7 - 7.7 K/uL Final  . Lymphocytes Relative 10/31/2020 13  % Final  . Lymphs Abs 10/31/2020 0.8  0.7 - 4.0 K/uL Final  . Monocytes Relative 10/31/2020 9  % Final  . Monocytes Absolute 10/31/2020 0.5  0.1 - 1.0 K/uL Final  . Eosinophils Relative 10/31/2020 1  % Final  . Eosinophils Absolute 10/31/2020 0.1  0.0 - 0.5 K/uL Final  . Basophils Relative 10/31/2020 0  % Final  . Basophils Absolute 10/31/2020 0.0  0.0 - 0.1 K/uL Final  . Immature Granulocytes 10/31/2020 1  % Final  . Abs Immature Granulocytes 10/31/2020 0.04  0.00 - 0.07 K/uL Final   Performed at [facility], [address], [place], [place] [address]    Assessment:  SHYANNA KLINGEL is a 65 y.o. female with clinical stage T3N1M1rectal cancer. She presented with a 9 month history of rectal bleeding. Colonoscopyon 01/14/2017 revealed afrond-like/villous non-obstructing large mass in the rectum. The mass was non-circumferential. Biopsies revealed high grade dysplasia within an adenoma (? sampling error). CEAwas 31.6 on 01/15/2017.  PET scanon 01/27/2017 revealed intense metabolic activity associated rectal mass consists with primary rectal carcinoma. There was a hypermetabolic metastatic lesion to the central LEFT hepatic lobe. There was metabolic activity associated with small LEFT lower lobe nodule which was most consistent with pulmonary metastasis.  CT guided liver biopsyat [facility] on 02/07/2017 revealed malignant cells c/w metastatic colorectal adenocarcinoma. [facility] on 12/29/2019 [phone]) revealed MS-stable, 1 Muts/Mb tumor mutational burden; KRAS G12C, PTEN rearrangement intron 8, APC G1106*,  [id number]*18, and NRAS wildtype.  There were no reportable mutations in BRAF and NRAS.     She received 2 cycles of FOLFOX (02/18/2017 - 03/04/2017). She developed transient cold induced neuropathy. She developed wheezing and hypoxia with cycle #2 felt secondary to oxaliplatin. She received 4 cycles ofFOLFIRI(03/24/2017 - 04/22/2017) with Neulasta support. Diarrhea was controlled with Imodium.  She underwent CT guided microwave ablation of the liver lesionon 06/06/2017.   She began radiationon 06/18/2017. She receiveddaily XMelody Haverradiation. Radiation and Xeloda completed on 08/15/2017.  She underwentAPR with descending colostomy and VRAM flapon 10/24/2017. She underwent a VATS wedge resectionto her LEFT lung. Pathology from the lung demonstrateda 1 cmmetastatic intestinal type adenocarcinoma c/w metastasis from patient's known colorectal carcinoma.Pathology from the rectal resection revealed a 2.1 cm invasive moderately differentiated adenocarcinoma arising in a tubular adenoma with transmural invasion focally into the perirectal soft tissue. There was no lymphovascular or perineural invasion. Margins were negative. Zero of 20 lymph nodes were positive. Final diagnosiswas [id number] adenocarcinoma with metastasis to left lower lobe of the lung.  She has had post-operative pain and ostomy ischemia. She underwent surgical ostomy revisionon 11/28/2017. Pathologyshowed focal mucosal erosion and fat necrosis c/w ischemia. There was extensive underlying fat necrosis. No dysplasia or carcinoma was identified. She was treated with oral Augmentin and wound packing. She has been treated with oxycodone 5 mg po q 4 hrs prn, gabapentin 900 mg TID. She was treated with Bactrim on 01/18/2018. She was referred to local pain management clinic on 01/27/2018.  PET scanon 11/29/2019 showed local recurrenceof hypermetabolic nodular lung malignancy at the resection  margin(1.6 x 1.5 cm; SUV 11.3)in the LEFT lower lobe. There was no evidence of hepatic metastasis. There was no evidence of local recurrence in pelvis. There was insufficiency fracture in the LEFT sacral ala presumably related to radiation treatment.  Chest CTon 12/13/2019 revealed anenlarging partially calcified2.3 x 1.2 cmlesion in the medial aspect of the left  lower lobe most compatible with a pulmonary metastasis given the hypermetabolism on recent PET-CT.There were several other tiny pulmonary nodules measuring 6 mm or less in size, some of whichwere new or increased in size compared to prior examinations.  She declined surgical resection.  Chest, abdomen and pelvis CT on 02/23/2020 revealed a stable 2.6 x 1.7 cm partially calcified left infrahilar pulmonary nodule, hypermetabolic on PET scan and likely metastasis.  There were new no new or enlarging pulmonary nodules.  There were stable treated hepatic metastatic disease with no evidence of progression or local recurrence.  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  There is no evidence of incarceration or obstruction.  There was mild wall thickening of multiple loops of small bowel in the mid abdomen, possibly related to prior radiation therapy.  There was stable chronic sacral insufficiency fractures bilaterally.  There was a stable 3.7 cm fusiform infrarenal abdominal aortic aneurysm.   She is s/p 1 cycle of FOLFIRI (03/08/2020) with Fulphila support.  Cycle #1 was notable for nausea, vomiting, and diarrhea.  She declined further chemotherapy.  She received IMRT (6000 cGy) to the left lung lesion from 05/10/2020 - 05/22/2020.   Chest, abdomen, and pelvis CT on 10/31/2020 revealed stable post treatment changes in pelvis and abdomen. Previously seen left lower quadrant parastomal hernia was no longer visualized. There were no acute findings. There was a stable 6 mm right lower lobe  pulmonary nodule. There was interval improvement in multifocal opacities in left upper and lower lobes, with several new areas of ground-glass opacity in right upper and middle lobes. These findings favored a waxing and waning inflammatory process.   CEAhas been followed: 31.6 on 01/15/2017, 38.1 on 02/18/2017, 25.1 on 03/18/2017, 11.8 on 04/08/2017, 9.2 on 04/22/2017, 9.5 on 05/20/2017, 9.6 on 06/19/2017, 3.7 on 02/18/2018,5.8 on 06/26/2018, 10.5 on 11/20/2018, 19.5 on 03/26/2019, 34.0 on 07/21/2019, 46.4 on 09/09/2019, 54.4 on 10/28/2019, 108.0 on 02/10/2020, 121.0 on 03/08/2020, 49.8 on 04/13/2020, 76.7 on 05/29/2020, 40.9 on 06/13/2020, 7.7 on 08/07/2020, and 9.5 on 10/31/2020.  Colonoscopy at [facility] 09/13/2019 did not appear malignant with no adenomatous (granulation tissue). There was diverticulosis in the descending colon, normal mucosa in the entire examined colon and the examined portion of the ileum. There was a 9 mmlipoma in the transverse colon anda4 mm polyp at the surgical stoma.   She has a B12 deficiency.[id number] (low)on 12/03/2019. She restarted B12 injections on 12/13/2019 (last 10/31/2020).  She has folate deficiency.  Folate was 5.8 (low) on 12/03/2019 and 44.0 on 09/27/2020.  She is on folic acid.    She has chronic hypokalemia.  She is on potassium 50 meq po q day.  [facility] for a smallbowel obstruction. She had severe abdominal pain with associated nausea and vomiting. Abdominal and pelvicCT revealedmultiple small bowel loops whichwere dilated.Obstruction resolved spontaneously.  She was admitted to [facility] from 03/22/2020 - 03/28/2020. She presented with an incarcerated hernia and underwent parastomal hernia repair with no bowel resection on 03/22/2020.  There was a 3.7 cm infrarenal abdominal aortic aneurysm with extensive mural thrombus, similar in size compared with previous examination, although degree of mural  thrombus had increased.  She received the first dose of the Moderna COVID-19 vaccine on 08/28/2020.  Symptomatically, she feels good. She is using a Breztri inhaler BID and an Albuterol inhaler q 6 hours prn. She reports wheezing and coughing up phlegm. She struggles to breathe when she lays on her  left side.  She denies bone pain and chest pain.  Plan: 1.   Review labs from 10/31/2020. 2.Stage IV rectal carcinoma Shereceived6cycles of FOLFOX(02/18/2017 -04/22/2017).  She underwent CT guided microwave ablation of the liver lesionon 06/06/2017.  She receivedradiationand daily Xeloda (06/18/2017-08/15/2017). She underwentAPR with descending colostomy and VRAM flapon 10/24/2017.  She underwent a VATS wedge resection of her left lung. 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  She declined left lower lobe lobectomy.  She received 1 cycle of FOLFIRI (03/08/2020).                She declined further chemotherapy or surgery.  She completed SBRT to the left infrahilar nodule on 05/22/2020.  CEA was 40.9 on 06/13/2020 and 9.5 on 10/31/2020.   Chest, abdomen, and pelvis CT on 10/31/2020 was personally reviewed.  Agree with radiology findings.   There are stable post treatment changes in pelvis and abdomen.    There is a stable 6 mm right lower lobe pulmonary nodule.    There is interval improvement in multifocal opacities in left upper and lower lobes, with several new areas of ground-glass opacity in right upper and middle lobes.     These findings favored a waxing and waning inflammatory process.   Symptoamtically, she continues to have some respiratory symptoms.  Continue ongoing surveillance. 3. B12 deficiency [id number] (low)on 12/03/2019. She receives B12 monthly (last 10/31/2020).  Continue B12 monthly. 4.  Folate deficiency  Folate was 5.8 (low) on 12/03/2019.  Folate was 44 on 09/27/2020.  She is currently taking folate 1 mg p.o. daily.  Hold folate for 2 weeks then begin 1 mg po 3 x/week (Mondays, Wednesdays and Fridays). 5.Hypokalemia Potassium3.4 on 10/31/2020. She is on potassium 50 mEq p.o. daily  Etiology secondary to hydrochlorothiazide.  She denies any diarrhea.  Discuss change in oral potassium from 10 mEq tablets to 20 mEq tablets.  Rx: potassium 20 mEq p.o. 3 times daily. 6.   RTC in 2 weeks for labs (BMP). 7.   RTC every month x 6 for B12 (last 10/31/2020). 8.   RTC in 6 weeks for labs (BMP, folate). 9.   RTC in 3 months for MD assessment and labs (CBC with diff, CMP, CEA).  I discussed the assessment and treatment plan with the patient.  The patient was provided an opportunity to ask questions and all were answered.  The patient agreed with the plan and demonstrated an understanding of the instructions.  The patient was advised to call back if the symptoms worsen or if the condition fails to improve as anticipated.   I provided 14 minutes of non-face-to-face telephone time during this this encounter and > 50% was spent counseling as documented under my assessment and plan.  I provided these services from the [facility] office.   Lequita Asal, MD, PhD 11/02/2020, 5:52 PM   I, De Burrs, am acting as scribe for [employer]. Mike Gip, MD, PhD.  I, Kolbi Altadonna C. Mike Gip, MD, have reviewed the above documentation for accuracy and completeness, and I agree with the above.

## 2020-11-01 LAB — CEA: CEA: 9.5 ng/mL — ABNORMAL HIGH (ref 0.0–4.7)

## 2020-11-02 ENCOUNTER — Encounter: Payer: Self-pay | Admitting: Hematology and Oncology

## 2020-11-02 ENCOUNTER — Inpatient Hospital Stay (HOSPITAL_BASED_OUTPATIENT_CLINIC_OR_DEPARTMENT_OTHER): Payer: Medicare HMO | Admitting: Hematology and Oncology

## 2020-11-02 ENCOUNTER — Telehealth: Payer: Medicare HMO | Admitting: Hematology and Oncology

## 2020-11-02 DIAGNOSIS — E876 Hypokalemia: Secondary | ICD-10-CM

## 2020-11-02 DIAGNOSIS — C20 Malignant neoplasm of rectum: Secondary | ICD-10-CM | POA: Diagnosis not present

## 2020-11-02 DIAGNOSIS — C7802 Secondary malignant neoplasm of left lung: Secondary | ICD-10-CM | POA: Diagnosis not present

## 2020-11-02 DIAGNOSIS — E538 Deficiency of other specified B group vitamins: Secondary | ICD-10-CM

## 2020-11-02 MED ORDER — POTASSIUM CHLORIDE CRYS ER 20 MEQ PO TBCR: 20.0000 meq | EXTENDED_RELEASE_TABLET | Freq: Three times a day (TID) | ORAL | 1 refills | Status: DC

## 2020-11-02 NOTE — Patient Instructions (Signed)
  Hold folate for 2 weeks then begin 1 tablet 3 days a week (Mondays, Wednesdays, Fridays).

## 2020-11-02 NOTE — Progress Notes (Signed)
Patient called/ pre- screened for appoinment today with oncologist. Chronic pain, otherwise no concerns at this time

## 2020-11-03 ENCOUNTER — Other Ambulatory Visit: Payer: Self-pay | Admitting: Hematology and Oncology

## 2020-11-03 DIAGNOSIS — E876 Hypokalemia: Secondary | ICD-10-CM

## 2020-11-03 NOTE — Telephone Encounter (Signed)
  Component Ref Range & Units 3 d ago  (10/31/20) 1 mo ago  (09/27/20) 2 mo ago  (08/30/20) 2 mo ago  (08/24/20) 2 mo ago  (08/24/20) 2 mo ago  (08/07/20) 4 mo ago  (07/04/20)  Potassium 3.5 - 5.1 mmol/L 3.4Low  4.4  3.9

## 2020-11-13 ENCOUNTER — Other Ambulatory Visit: Payer: Self-pay

## 2020-11-13 DIAGNOSIS — C20 Malignant neoplasm of rectum: Secondary | ICD-10-CM

## 2020-11-15 ENCOUNTER — Encounter: Payer: Self-pay | Admitting: Primary Care

## 2020-11-15 ENCOUNTER — Ambulatory Visit (INDEPENDENT_AMBULATORY_CARE_PROVIDER_SITE_OTHER): Payer: Medicare HMO | Admitting: Primary Care

## 2020-11-15 ENCOUNTER — Other Ambulatory Visit: Payer: Self-pay

## 2020-11-15 VITALS — BP 118/60 | HR 96 | Temp 97.0°F | Ht 66.0 in | Wt 185.4 lb

## 2020-11-15 DIAGNOSIS — J449 Chronic obstructive pulmonary disease, unspecified: Secondary | ICD-10-CM

## 2020-11-15 DIAGNOSIS — J441 Chronic obstructive pulmonary disease with (acute) exacerbation: Secondary | ICD-10-CM

## 2020-11-15 DIAGNOSIS — R918 Other nonspecific abnormal finding of lung field: Secondary | ICD-10-CM

## 2020-11-15 MED ORDER — DOXYCYCLINE HYCLATE 100 MG PO TABS: 100.0000 mg | ORAL_TABLET | Freq: Two times a day (BID) | ORAL | 0 refills | Status: DC

## 2020-11-15 MED ORDER — BREZTRI AEROSPHERE 160-9-4.8 MCG/ACT IN AERO: 2.0000 | INHALATION_SPRAY | Freq: Two times a day (BID) | RESPIRATORY_TRACT | 0 refills | Status: AC

## 2020-11-15 MED ORDER — MONTELUKAST SODIUM 10 MG PO TABS: 10.0000 mg | ORAL_TABLET | Freq: Every day | ORAL | 11 refills | Status: DC

## 2020-11-15 MED ORDER — METHYLPREDNISOLONE ACETATE 80 MG/ML IJ SUSP
80.0000 mg | Freq: Once | INTRAMUSCULAR | Status: AC
  Administered 2020-11-15: 80 mg via INTRAMUSCULAR

## 2020-11-15 MED ORDER — PREDNISONE 10 MG PO TABS: ORAL_TABLET | ORAL | 0 refills | Status: AC

## 2020-11-15 NOTE — Patient Instructions (Addendum)
Recommendations: - Continue Breztri two puffs twice a day - Use albuterol 2 puffs every 6 hours as needed for breakthrough shortness of breath/wheezing - Start Singulair 10mg  at bedtime - Start prednisone taper tomorrow  - Start antibiotic called doxycycline x 10 days  - Take mucinex 600mg  twice a day with full glass of water  - Use flutter valve three times a day   Office treatment: - Depo medrol 80mg  injection   Orders: - CT chest wo contrast in 6 months   Rx: - Prednisone taper as directed  - Doxycycline 1 tab twice a day x 10 days   Follow-up - 1 month with Dr. Patsey Berthold

## 2020-11-15 NOTE — Progress Notes (Signed)
@Patient  ID: Tamara Hall, female    DOB: Dec 11, 1955, 65 y.o.   MRN: [id number]  Chief Complaint  Patient presents with  . Follow-up    Sob with exertion, prod cough with yellow to green sputum and wheezing.     Referring provider: Loni Muse, MD  HPI: 65 year old female, current everyday smoker (80-pack-year history).  Past medical history significant for lung cancer, atypical pneumonia, GERD, rectal cancer, liver metastases, history of small bowel obstruction.  Patient of Dr. Patsey Berthold, consult on 09/06/2020 for shortness of breath.   Previous [facility] pulmonary encounter: 09/06/2020 Patient is a 65 year old current smoker (3 cigarettes/day, total 80 pack years) with known stage IV colorectal cancer known metastasis to the lung with complaint of increased shortness of breath for the last 2 to 3 weeks and increasing dry nonproductive cough discomfort.  She is kindly referred by Dr. Nolon Stalls.  The patient has had a complicated history with her colorectal cancer and has had metastatic disease to the lung noted on an infrahilar nodule.  She received radiation to this infrahilar nodule and completed this on 22 May 2020.  She had been doing fairly well until approximately 2 to 3 weeks ago.  As noted she has a longstanding history of smoking.  For at least the last 6 months she has decreased her cigarette use to 3 cigarettes/day.  She has not noted any fevers or chills.Cough as noted has been mostly nonproductive.  She has had a Z-Pak with no relief in her symptoms.  She also had a very short course of steroids with perhaps some minimal response.  She notes that using her husband's Symbicort inhaler helps.  Albuterol alone does not help.  The chest on 22 August 2020 that showed reduction in the size of the previously noted left infrahilar nodule as well as patchy groundglass opacity in the left upper lobe and lingula as well as the left lower lobe.  This was read as potential radiation  pneumonitis.  Subsequently she was seen at the ED because of her shortness of breath a CT angio of the chest was performed at that time that showed no pulmonary embolism.  There were clusters of groundglass density primary on the lingula as well as the left lower lobe and infrahilar region.  There was disimpaction of the left lower lobe bronchi however there was also narrowing with peribronchial thickening in this area.  Representative slice of this area is below.  Reviewing Dr. Kem Parkinson notes her care summary for her stage IV rectal carcinoma is as follows: Stage IV rectal carcinoma Shereceived6cycles of FOLFOX(02/18/2017 -04/22/2017).  She underwent CT guided microwave ablation of the liver lesionon 06/06/2017.  She receivedradiationand daily Xeloda (06/18/2017-08/15/2017). She underwentAPR with descending colostomy and VRAM flapon 10/24/2017.  She underwent a VATS wedge resection of her left lung. 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             She declined left lower lobe lobectomy.             She received 1 cycle of FOLFIRI (03/08/2020).             She declined further chemotherapy or surgery.             She completed SBRT to the left infrahilar nodule on 05/22/2020.             CEA was 40.9 on 06/13/2020 and 7.7 on 08/07/2020.  Chest CT on 08/22/2020                          Left infrahilar nodule is slightly smaller.  I reviewed the patient's [facility] records.  Prior to colorectal cancer resection  in 2019 she underwent spirometry with DLCO.  Her FEV1 at that time was noted to be 1.84 L    and FVC was 2.56 L.  Her diffusion capacity was also reduced.  This is at least consistent with mild to moderate COPD.  Patient was not aware of these results.  Also was not aware that she had a diagnosis of COPD.  11/15/2020  -interim history Patient presents today for 36-month follow-up.  During last visit she was given samples of Breztri and prednisone taper.  Pulmonary function testing at [facility] showed severe obstructive airway disease with significant bronchodilator response and evidence of air trapping/hyperinflation. She continues to have symptoms of exertional dyspnea, wheezing and productive cough with purulent mucus.    No Known Allergies  Immunization History  Administered Date(s) Administered  . DTaP 09/10/1996  . Influenza,inj,Quad PF,6+ Mos 08/05/2017, 07/08/2018, 07/21/2019, 08/07/2020  . Moderna Sars-Covid-2 Vaccination 08/28/2020, 09/26/2020  . Pneumococcal Polysaccharide-23 02/06/2017    Past Medical History:  Diagnosis Date  . Cancer ([place])    colon, with mets to liver and left lung  . Hypertension   . Pneumonia    20+ years ago    Tobacco History: Social History   Tobacco Use  Smoking Status Current Every Day Smoker  . Packs/day: 2.00  . Years: 40.00  . Pack years: 80.00  . Types: Cigarettes  Smokeless Tobacco Never Used  Tobacco Comment   3 cigs daily--11/15/2020   Ready to quit: Not Answered Counseling given: Not Answered Comment: 3 cigs daily--11/15/2020   Outpatient Medications Prior to Visit  Medication Sig Dispense Refill  . albuterol (VENTOLIN HFA) 108 (90 Base) MCG/ACT inhaler Inhale 1-2 puffs into the lungs every 6 (six) hours as needed for wheezing or shortness of breath.     Marland Kitchen amLODipine (NORVASC) 5 MG tablet Take 5 mg by mouth daily.    . baclofen (LIORESAL) 10 MG tablet Take 10 mg by mouth 3 (three) times daily.    . Budeson-Glycopyrrol-Formoterol (BREZTRI AEROSPHERE) 160-9-4.8 MCG/ACT AERO Inhale 2 puffs into the lungs in the morning and at bedtime. 10.7 g 0  . Calcium Carbonate (CALCIUM 500 PO) Take 1 tablet by mouth daily.    . folic acid (FOLVITE) 1 MG tablet TAKE 1 TABLET BY MOUTH EVERY DAY 90 tablet 1  . gabapentin (NEURONTIN) 300 MG capsule Take  1,200 mg by mouth 3 (three) times daily.     . hydrochlorothiazide (HYDRODIURIL) 25 MG tablet Take 25 mg by mouth daily.    Marland Kitchen HYDROcodone-acetaminophen (NORCO) 10-325 MG tablet SMARTSIG:1 Tablet(s) By Mouth 4-5 Times Daily    . nortriptyline (PAMELOR) 50 MG capsule Take 50 mg by mouth at bedtime.    Marland Kitchen omeprazole (PRILOSEC) 20 MG capsule TAKE 1 CAPSULE BY MOUTH EVERY DAY 30 capsule 1  . Ostomy Supplies (PREMIER DRAINABLE POUCH 64MM) Pouch MISC     . potassium chloride SA (KLOR-CON) 20 MEQ tablet Take 1 tablet (20 mEq total) by mouth 3 (three) times daily. 90 tablet 1  . Budeson-Glycopyrrol-Formoterol (BREZTRI AEROSPHERE) 160-9-4.8 MCG/ACT AERO Inhale 2 puffs into the lungs in the morning and at bedtime. 5.9 g 0  . gabapentin (NEURONTIN) 300 MG capsule Take by mouth.    Marland Kitchen  potassium chloride (KLOR-CON M10) 10 MEQ tablet TAKE 1 TABLET (10 MEQ TOTAL) BY MOUTH 5 (FIVE) TIMES DAILY. 150 tablet 0  . predniSONE (STERAPRED UNI-PAK 21 TAB) 10 MG (21) TBPK tablet Take as directed in the package 21 each 0   No facility-administered medications prior to visit.    Review of Systems  Review of Systems  Respiratory: Positive for cough, shortness of breath and wheezing.    Physical Exam  BP 118/60 (BP Location: Left Arm, Cuff Size: Normal)   Pulse 96   Temp (!) 97 F (36.1 C) (Temporal)   Ht 5\' 6"  (1.676 m)   Wt 185 lb 6.4 oz (84.1 kg)   SpO2 94%   BMI 29.92 kg/m  Physical Exam Constitutional:      Appearance: Normal appearance.  HENT:     Head: Normocephalic and atraumatic.     Mouth/Throat:     Comments: Deferred d/t masking  Cardiovascular:     Rate and Rhythm: Normal rate.  Pulmonary:     Breath sounds: Wheezing and rhonchi present.  Musculoskeletal:        General: Normal range of motion.  Skin:    General: Skin is warm and dry.  Neurological:     General: No focal deficit present.     Mental Status: She is alert and oriented to person, place, and time. Mental status is at  baseline.  Psychiatric:        Mood and Affect: Mood normal.        Behavior: Behavior normal.        Thought Content: Thought content normal.        Judgment: Judgment normal.      Lab Results:  CBC    Component Value Date/Time   WBC 6.1 10/31/2020 1154   RBC 4.18 10/31/2020 1154   HGB 12.6 10/31/2020 1154   HCT 38.3 10/31/2020 1154   PLT 256 10/31/2020 1154   MCV 91.6 10/31/2020 1154   MCH 30.1 10/31/2020 1154   MCHC 32.9 10/31/2020 1154   RDW 13.8 10/31/2020 1154   LYMPHSABS 0.8 10/31/2020 1154   MONOABS 0.5 10/31/2020 1154   EOSABS 0.1 10/31/2020 1154   BASOSABS 0.0 10/31/2020 1154    BMET    Component Value Date/Time   NA 137 11/16/2020 1256   K 4.0 11/16/2020 1256   CL 98 11/16/2020 1256   CO2 26 11/16/2020 1256   GLUCOSE 135 (H) 11/16/2020 1256   BUN 13 11/16/2020 1256   CREATININE 0.75 11/16/2020 1256   CALCIUM 9.3 11/16/2020 1256   GFRNONAA >60 11/16/2020 1256   GFRAA >60 07/04/2020 1211    BNP    Component Value Date/Time   BNP 29.8 08/24/2020 1620    ProBNP No results found for: PROBNP  Imaging: CT CHEST ABDOMEN PELVIS W CONTRAST  Result Date: 10/31/2020 CLINICAL DATA:  Follow-up metastatic colon carcinoma. Severe left abdominal and pelvic pain cramping. EXAM: CT CHEST, ABDOMEN, AND PELVIS WITH CONTRAST TECHNIQUE: Multidetector CT imaging of the chest, abdomen and pelvis was performed following the standard protocol during bolus administration of intravenous contrast. CONTRAST:  172mL OMNIPAQUE IOHEXOL 300 MG/ML  SOLN COMPARISON:  Chest CT on 08/22/2020 and AP CT on 02/23/2020 FINDINGS: CT CHEST FINDINGS Cardiovascular: No acute findings. Aortic and coronary atherosclerotic calcification noted. Mediastinum/Lymph Nodes: No masses or pathologically enlarged lymph nodes identified. Stable calcified bilateral hilar and subcarinal lymph nodes. Lungs/Pleura: Multifocal areas of ground-glass and airspace opacity in the left upper and lower lobes show  overall improvement since prior exam, while there are several new areas of ground-glass opacity seen in the right upper and middle lobes. These findings favor a waxing and waning inflammatory process. Probable radiation changes and mild bronchiectasis in the central left upper lobe persists and is likely due to chronic radiation changes. 6 mm pulmonary nodule in the right lower lobe on image 91/4 remains stable. No evidence of pleural effusion. Musculoskeletal:  No suspicious bone lesions identified. CT ABDOMEN AND PELVIS FINDINGS Hepatobiliary: Well-circumscribed low-attenuation ablation defect in the medial segment of left lobe measures 2.9 x 2.2 cm and is stable since previous study. No new or enlarging liver lesions identified. Gallbladder is unremarkable. No evidence of biliary ductal dilatation. Pancreas:  No mass or inflammatory changes. Spleen:  Within normal limits in size and appearance. Adrenals/Urinary tract:  No masses or hydronephrosis. Stomach/Bowel: Stable post treatment changes from rectosigmoid resection with left lower quadrant colostomy. The previously seen parastomal hernia containing small bowel is no longer visualized. No evidence of bowel obstruction, inflammatory process, or abnormal fluid collections. Vascular/Lymphatic: No pathologically enlarged lymph nodes identified. 3.8 cm infrarenal abdominal aortic aneurysm is again seen, without significant change. Reproductive: Prior hysterectomy. Stable post treatment changes in pelvis. No mass or other significant abnormality identified. Other:  None. Musculoskeletal: No suspicious bone lesions identified. Bilateral sacral insufficiency fractures are again noted. IMPRESSION: Stable post treatment changes in pelvis and abdomen. Previously seen left lower quadrant parastomal hernia is no longer visualized. No acute findings. Stable 6 mm right lower lobe pulmonary nodule. Interval improvement in multifocal opacities in left upper and lower lobes,  with several new areas of ground-glass opacity in right upper and middle lobes. These findings favor a waxing and waning inflammatory process. Recommend continued attention on follow-up CT. Aortic Atherosclerosis (ICD10-I70.0). Electronically Signed   By: Marlaine Hind M.D.   On: 10/31/2020 13:55     Assessment & Plan:   Stage 3 severe COPD by GOLD classification ([place]) - PFTs showed severe obstructive airways disease with +BD response. Patient has high symptom burden. She has signs of acute exacerbation today in office. Lungs have coarse rhonchi and wheezing throughout. Patient received 80mg  depo-medrol injection in office today. Continue Breztri two puffs twice a day;  albuterol 2 puffs every 6 hours as needed for breakthrough shortness of breath/wheezing. Sending in RX for doxycyline 100mg  BID x 10 days and prednisone taper and starting Singulair 10mg  at bedtime for asthmatic component. Advised she take mucinex 600mg  twice a day with full glass of water and use flutter valve three times a day. Follow-up 1 month with Dr. Clarisa Kindred glass opacity present on imaging of lung - CT chest 11/01/19 showed interval improvement in multifocal opacities in left upper and lower lobes, with several new areas of ground-glass opacity in right upper and middle lobes. These findings favor a waxing and waning inflammatory process - Recommend repeating CT chest wo contrast in 6 months to monitor new areas of [place], NP 11/16/2020

## 2020-11-16 ENCOUNTER — Encounter: Payer: Self-pay | Admitting: Primary Care

## 2020-11-16 ENCOUNTER — Inpatient Hospital Stay: Payer: Medicare HMO | Attending: Hematology and Oncology

## 2020-11-16 DIAGNOSIS — J449 Chronic obstructive pulmonary disease, unspecified: Secondary | ICD-10-CM | POA: Insufficient documentation

## 2020-11-16 DIAGNOSIS — C20 Malignant neoplasm of rectum: Secondary | ICD-10-CM | POA: Diagnosis present

## 2020-11-16 DIAGNOSIS — E538 Deficiency of other specified B group vitamins: Secondary | ICD-10-CM | POA: Insufficient documentation

## 2020-11-16 LAB — BASIC METABOLIC PANEL
Anion gap: 13 (ref 5–15)
BUN: 13 mg/dL (ref 8–23)
CO2: 26 mmol/L (ref 22–32)
Calcium: 9.3 mg/dL (ref 8.9–10.3)
Chloride: 98 mmol/L (ref 98–111)
Creatinine, Ser: 0.75 mg/dL (ref 0.44–1.00)
GFR, Estimated: >60 mL/min (ref >60)
Glucose, Bld: 135 mg/dL — ABNORMAL HIGH (ref 70–99)
Potassium: 4 mmol/L (ref 3.5–5.1)
Sodium: 137 mmol/L (ref 135–145)

## 2020-11-16 NOTE — Assessment & Plan Note (Addendum)
-   CT chest 11/01/19 showed interval improvement in multifocal opacities in left upper and lower lobes, with several new areas of ground-glass opacity in right upper and middle lobes. These findings favor a waxing and waning inflammatory process - Recommend repeating CT chest wo contrast in 6 months to monitor new areas of GGO

## 2020-11-16 NOTE — Assessment & Plan Note (Addendum)
-   PFTs showed severe obstructive airways disease with +BD response. Patient has high symptom burden. She has signs of acute exacerbation today in office. Lungs have coarse rhonchi and wheezing throughout. Patient received 80mg  depo-medrol injection in office today. Continue Breztri two puffs twice a day;  albuterol 2 puffs every 6 hours as needed for breakthrough shortness of breath/wheezing. Sending in RX for doxycyline 100mg  BID x 10 days and prednisone taper and starting Singulair 10mg  at bedtime for asthmatic component. Advised she take mucinex 600mg  twice a day with full glass of water and use flutter valve three times a day. Follow-up 1 month with Dr. Patsey Berthold

## 2020-11-20 ENCOUNTER — Other Ambulatory Visit: Payer: Self-pay | Admitting: Hematology and Oncology

## 2020-11-20 ENCOUNTER — Ambulatory Visit: Payer: Medicare HMO

## 2020-11-20 DIAGNOSIS — K2100 Gastro-esophageal reflux disease with esophagitis, without bleeding: Secondary | ICD-10-CM

## 2020-11-24 NOTE — Progress Notes (Signed)
Agree with the details of the visit as noted by Elizabeth Walsh, NP.  C. Laura Jennylee Uehara, MD [facility] 

## 2020-11-28 ENCOUNTER — Other Ambulatory Visit: Payer: Self-pay | Admitting: Hematology and Oncology

## 2020-11-28 DIAGNOSIS — E876 Hypokalemia: Secondary | ICD-10-CM

## 2020-11-29 ENCOUNTER — Inpatient Hospital Stay: Payer: Medicare HMO

## 2020-11-29 ENCOUNTER — Other Ambulatory Visit: Payer: Self-pay

## 2020-11-29 ENCOUNTER — Ambulatory Visit
Admission: RE | Admit: 2020-11-29 | Discharge: 2020-11-29 | Disposition: A | Discharge status: Home / Self Care | Payer: Medicare HMO | Source: Ambulatory Visit | Attending: Radiation Oncology | Admitting: Radiation Oncology

## 2020-11-29 ENCOUNTER — Ambulatory Visit: Payer: Medicare HMO

## 2020-11-29 ENCOUNTER — Encounter: Payer: Self-pay | Admitting: Radiation Oncology

## 2020-11-29 VITALS — BP 147/77 | HR 93 | Temp 96.2°F | Wt 185.0 lb

## 2020-11-29 DIAGNOSIS — E876 Hypokalemia: Secondary | ICD-10-CM

## 2020-11-29 DIAGNOSIS — E538 Deficiency of other specified B group vitamins: Secondary | ICD-10-CM | POA: Diagnosis not present

## 2020-11-29 DIAGNOSIS — C20 Malignant neoplasm of rectum: Secondary | ICD-10-CM

## 2020-11-29 DIAGNOSIS — C7800 Secondary malignant neoplasm of unspecified lung: Secondary | ICD-10-CM

## 2020-11-29 MED ORDER — CYANOCOBALAMIN 1000 MCG/ML IJ SOLN
1000.0000 ug | Freq: Once | INTRAMUSCULAR | Status: AC
  Administered 2020-11-29: 1000 ug via INTRAMUSCULAR
  Filled 2020-11-29: qty 1

## 2020-11-29 NOTE — Progress Notes (Signed)
Radiation Oncology Follow up Note  Name: Tamara Hall   Date:   11/29/2020 MRN:  [id number] DOB: 1956-07-05    This 65 y.o. female presents to the clinic today for 32-month follow-up status post radiation therapy to her left lung for metastatic disease in patient with known stage IV colorectal cancer.  REFERRING PROVIDER: Loni Muse, MD  HPI: Patient is a 65 year old female now out 6 months having completed palliative radiation therapy to her left lung for metastatic involvement of stage IV colorectal cancer..  She had a VATS surgical's wedge resection of her left lung lesion although did recur prompting radiation therapy she received Xeloda with radiation therapy for her lung metastasis.  She is also had CT-guided microwave ablation of her liver lesion back in 2018.  Her recent CT scan back in January showed post treatment change in the pelvis and abdomen with interval improvement in multifocal opacities in left upper and lower lobes with several new areas of groundglass opacity in the right upper lobe favoring waxing and waning inflammatory process.  She is currently under observation.  She is also recently seen Dr. Patsey Berthold has had her pulmonary functions tuned up and she is breathing extremely well at this time on her current regiment.  COMPLICATIONS OF TREATMENT: none  FOLLOW UP COMPLIANCE: keeps appointments   PHYSICAL EXAM:  BP (!) 147/77   Pulse 93   Temp (!) 96.2 F (35.7 C) (Tympanic)   Wt 185 lb (83.9 kg)   SpO2 94% Comment: room air  BMI 29.86 kg/m  Well-developed well-nourished patient in NAD. HEENT reveals PERLA, EOMI, discs not visualized.  Oral cavity is clear. No oral mucosal lesions are identified. Neck is clear without evidence of cervical or supraclavicular adenopathy. Lungs are clear to A&P. Cardiac examination is essentially unremarkable with regular rate and rhythm without murmur rub or thrill. Abdomen is benign with no organomegaly or masses noted. Motor sensory  and DTR levels are equal and symmetric in the upper and lower extremities. Cranial nerves II through XII are grossly intact. Proprioception is intact. No peripheral adenopathy or edema is identified. No motor or sensory levels are noted. Crude visual fields are within normal range.  RADIOLOGY RESULTS: CT scan reviewed compatible with above-stated findings  PLAN: Present time patient is doing well currently under observation for her stage IV colorectal cancer.  I am pleased with her overall progress.  I have asked to see her back in 6 months for follow-up.  She continues close follow-up care by both Dr. Patsey Berthold and Dr. Mike Gip.  Patient knows to call with any concerns.  I would like to take this opportunity to thank you for allowing me to participate in the care of your patient.Noreene Filbert, MD

## 2020-11-30 ENCOUNTER — Inpatient Hospital Stay: Payer: Medicare HMO

## 2020-12-04 ENCOUNTER — Inpatient Hospital Stay: Payer: Medicare HMO

## 2020-12-14 ENCOUNTER — Inpatient Hospital Stay: Payer: Medicare HMO | Attending: Hematology and Oncology

## 2020-12-14 ENCOUNTER — Other Ambulatory Visit: Payer: Medicare HMO

## 2020-12-14 DIAGNOSIS — C20 Malignant neoplasm of rectum: Secondary | ICD-10-CM | POA: Diagnosis not present

## 2020-12-14 LAB — URINALYSIS, DIPSTICK ONLY
Bilirubin Urine: NEGATIVE
Glucose, UA: NEGATIVE mg/dL
Hgb urine dipstick: NEGATIVE
Ketones, ur: NEGATIVE mg/dL
Nitrite: NEGATIVE
Protein, ur: NEGATIVE mg/dL
Specific Gravity, Urine: 1.019 (ref 1.005–1.030)
pH: 5 (ref 5.0–8.0)

## 2020-12-14 LAB — COMPREHENSIVE METABOLIC PANEL
ALT: 14 U/L (ref 0–44)
AST: 20 U/L (ref 15–41)
Albumin: 3.9 g/dL (ref 3.5–5.0)
Alkaline Phosphatase: 79 U/L (ref 38–126)
Anion gap: 12 (ref 5–15)
BUN: 12 mg/dL (ref 8–23)
CO2: 25 mmol/L (ref 22–32)
Calcium: 9.1 mg/dL (ref 8.9–10.3)
Chloride: 100 mmol/L (ref 98–111)
Creatinine, Ser: 0.93 mg/dL (ref 0.44–1.00)
GFR, Estimated: >60 mL/min (ref >60)
Glucose, Bld: 101 mg/dL — ABNORMAL HIGH (ref 70–99)
Potassium: 3.6 mmol/L (ref 3.5–5.1)
Sodium: 137 mmol/L (ref 135–145)
Total Bilirubin: 0.6 mg/dL (ref 0.3–1.2)
Total Protein: 7.3 g/dL (ref 6.5–8.1)

## 2020-12-14 LAB — CBC WITH DIFFERENTIAL/PLATELET
Abs Immature Granulocytes: 0.06 10*3/uL (ref 0.00–0.07)
Basophils Absolute: 0 10*3/uL (ref 0.0–0.1)
Basophils Relative: 0 %
Eosinophils Absolute: 0.2 10*3/uL (ref 0.0–0.5)
Eosinophils Relative: 2 %
HCT: 40.8 % (ref 36.0–46.0)
Hemoglobin: 13.7 g/dL (ref 12.0–15.0)
Immature Granulocytes: 1 %
Lymphocytes Relative: 11 %
Lymphs Abs: 1 10*3/uL (ref 0.7–4.0)
MCH: 31.1 pg (ref 26.0–34.0)
MCHC: 33.6 g/dL (ref 30.0–36.0)
MCV: 92.7 fL (ref 80.0–100.0)
Monocytes Absolute: 0.5 10*3/uL (ref 0.1–1.0)
Monocytes Relative: 6 %
Neutro Abs: 6.9 10*3/uL (ref 1.7–7.7)
Neutrophils Relative %: 80 %
Platelets: 266 10*3/uL (ref 150–400)
RBC: 4.4 MIL/uL (ref 3.87–5.11)
RDW: 13.3 % (ref 11.5–15.5)
WBC: 8.5 10*3/uL (ref 4.0–10.5)
nRBC: 0 % (ref 0.0–0.2)

## 2020-12-15 ENCOUNTER — Other Ambulatory Visit: Payer: Self-pay | Admitting: Hematology and Oncology

## 2020-12-15 DIAGNOSIS — E876 Hypokalemia: Secondary | ICD-10-CM

## 2020-12-17 ENCOUNTER — Other Ambulatory Visit: Payer: Self-pay | Admitting: Hematology and Oncology

## 2020-12-17 DIAGNOSIS — K2100 Gastro-esophageal reflux disease with esophagitis, without bleeding: Secondary | ICD-10-CM

## 2020-12-18 ENCOUNTER — Ambulatory Visit: Payer: Medicare HMO

## 2020-12-21 ENCOUNTER — Other Ambulatory Visit: Payer: Self-pay | Admitting: Hematology and Oncology

## 2020-12-21 DIAGNOSIS — E876 Hypokalemia: Secondary | ICD-10-CM

## 2020-12-28 ENCOUNTER — Encounter: Payer: Self-pay | Admitting: Pulmonary Disease

## 2020-12-28 ENCOUNTER — Other Ambulatory Visit: Payer: Self-pay

## 2020-12-28 ENCOUNTER — Ambulatory Visit (INDEPENDENT_AMBULATORY_CARE_PROVIDER_SITE_OTHER): Payer: Medicare HMO | Admitting: Pulmonary Disease

## 2020-12-28 ENCOUNTER — Inpatient Hospital Stay: Payer: Medicare HMO

## 2020-12-28 VITALS — BP 116/62 | HR 89 | Temp 97.0°F | Ht 67.0 in | Wt 185.4 lb

## 2020-12-28 DIAGNOSIS — R0602 Shortness of breath: Secondary | ICD-10-CM

## 2020-12-28 DIAGNOSIS — F17210 Nicotine dependence, cigarettes, uncomplicated: Secondary | ICD-10-CM | POA: Diagnosis not present

## 2020-12-28 DIAGNOSIS — J449 Chronic obstructive pulmonary disease, unspecified: Secondary | ICD-10-CM | POA: Diagnosis not present

## 2020-12-28 DIAGNOSIS — C19 Malignant neoplasm of rectosigmoid junction: Secondary | ICD-10-CM | POA: Diagnosis not present

## 2020-12-28 MED ORDER — TRELEGY ELLIPTA 200-62.5-25 MCG/INH IN AEPB: 200.0000 ug | INHALATION_SPRAY | Freq: Every day | RESPIRATORY_TRACT | 0 refills | Status: DC

## 2020-12-28 NOTE — Patient Instructions (Signed)
We are switching your breathing medicine to Trelegy Ellipta 1 puff daily.  Let us know how you do with this inhaler and we will call the prescription in for you.   We will see you in follow-up in 2 months time.  Call sooner should any new problems arise.

## 2020-12-28 NOTE — Progress Notes (Signed)
Subjective:    Patient ID: Tamara Hall, female    DOB: 07-07-1956, 65 y.o.   MRN: [id number] Chief Complaint  Patient presents with   Follow-up    SOB- anytime with walking. Inhaler caused sores on tongue but worked great.     HPI 65 year old female, current everyday smoker (80-pack-year history).  Past medical history significant for lung cancer, atypical pneumonia, GERD, stage IV colorectal cancer, liver metastases, history of small bowel obstruction.  She follows here for her issues with COPD and dyspnea.  This is a scheduled visit.  Last seen on 15 November 2020 by Geraldo Pitter, NP at our clinic.  Since that time she has stopped taking Breztri due to sore mouth.  She notes this inhaler was working well for her but every time she used that she would get significant sores in her mouth and she had to stop using it.  She has dyspnea on exertion but this is fairly much at baseline.  Notes cough particularly in the mornings with grayish sputum production.  She does not endorse any other new complaints today.  Review of Systems A 10 point review of systems was performed and it is as noted above otherwise negative.  Patient Active Problem List   Diagnosis Date Noted   Stage 3 severe COPD by GOLD classification ([place]) 11/16/2020   Shortness of breath 09/10/2020   Ground glass opacity present on imaging of lung 08/30/2020   Atypical pneumonia 08/30/2020   Gastroesophageal reflux disease with esophagitis 06/04/2020   Folate deficiency 12/04/2019   B12 deficiency 11/15/2019   H/O small bowel obstruction 10/29/2019   Pain in pelvis 10/28/2019   SBO (small bowel obstruction) ([place]) 08/13/2019   Diarrhea 07/11/2017   Hypokalemia 07/08/2017   Malignant neoplasm metastatic to left lung ([place]) 05/27/2017   Nausea without vomiting 05/06/2017   Liver metastases ([place]) 02/25/2017   Goals of care, counseling/discussion 02/18/2017   Encounter for antineoplastic chemotherapy 02/18/2017   Cancer  related pain 02/16/2017   Tobacco use 02/06/2017   Abnormal findings-gastrointestinal tract    Rectum neoplasm    Rectal bleeding 01/13/2017   Rectal cancer ([facility])    No Known Allergies Current Meds  Medication Sig   albuterol (VENTOLIN HFA) 108 (90 Base) MCG/ACT inhaler Inhale 1-2 puffs into the lungs every 6 (six) hours as needed for wheezing or shortness of breath.    amLODipine (NORVASC) 5 MG tablet Take 5 mg by mouth daily.   baclofen (LIORESAL) 10 MG tablet Take 10 mg by mouth 3 (three) times daily.   Calcium Carbonate (CALCIUM 500 PO) Take 1 tablet by mouth daily.   folic acid (FOLVITE) 1 MG tablet TAKE 1 TABLET BY MOUTH EVERY DAY   gabapentin (NEURONTIN) 300 MG capsule Take 1,200 mg by mouth 3 (three) times daily.    hydrochlorothiazide (HYDRODIURIL) 25 MG tablet Take 25 mg by mouth daily.   HYDROcodone-acetaminophen (NORCO) 10-325 MG tablet SMARTSIG:1 Tablet(s) By Mouth 4-5 Times Daily   KLOR-CON M20 20 MEQ tablet TAKE 1 TABLET BY MOUTH THREE TIMES A DAY   montelukast (SINGULAIR) 10 MG tablet Take 1 tablet (10 mg total) by mouth at bedtime.   nortriptyline (PAMELOR) 50 MG capsule Take 50 mg by mouth at bedtime.   omeprazole (PRILOSEC) 20 MG capsule TAKE 1 CAPSULE BY MOUTH EVERY DAY   Ostomy Supplies (PREMIER DRAINABLE POUCH [phone]) Pouch MISC    Immunization History  Administered Date(s) Administered   DTaP 09/10/1996   Influenza,inj,Quad PF,6+ Mos 08/05/2017, 07/08/2018,  07/21/2019, 08/07/2020   Influenza-Unspecified 08/05/2017, 08/07/2020   Moderna Sars-Covid-2 Vaccination 08/28/2020, 09/26/2020   Pneumococcal Conjugate-13 10/17/2020   Pneumococcal Polysaccharide-23 02/06/2017   Social History   Tobacco Use   Smoking status: Current Every Day Smoker    Packs/day: 1.50    Years: 40.00    Pack years: 60.00    Types: Cigarettes   Smokeless tobacco: Never Used   Tobacco comment: 2 cigs daily--12/28/20  Substance Use Topics   Alcohol use: No       Objective:    Physical Exam BP 116/62 (BP Location: Left Arm, Patient Position: Sitting, Cuff Size: Normal)    Pulse 89    Temp (!) 97 F (36.1 C) (Temporal)    Ht 5\' 7"  (1.702 m)    Wt 185 lb 6.4 oz (84.1 kg)    SpO2 96%    BMI 29.04 kg/m  GENERAL: Well-developed well-nourished woman in no acute distress.  Occasional dry cough noted.  Uses walker for ambulation.  Raspy voice HEAD: Normocephalic, atraumatic.  EYES: Pupils equal, round, reactive to light.  No scleral icterus.  MOUTH: Nose/mouth/throat not examined due to masking requirements for COVID 19. NECK: Supple. No thyromegaly. Trachea midline. No JVD.  No adenopathy. PULMONARY: Good air entry bilaterally.  She has scattered rhonchi and end expiratory wheezes throughout.   CARDIOVASCULAR: S1 and S2. Regular rate and rhythm.  No rubs, murmurs or gallops heard. ABDOMEN: Benign. MUSCULOSKELETAL: No joint deformity, no clubbing, no edema.  NEUROLOGIC: No focal deficits, speech is fluent.  Gait assisted with walker. SKIN: Intact,warm,dry.  PSYCH: Mood and behavior normal.      Assessment & Plan:     ICD-10-CM   1. Stage 3 severe COPD by GOLD classification ([place])  J44.9    We will give her a trial of Trelegy Ellipta Rinse mouth well after use Follow-up 2 months    2. Shortness of breath  R06.02    Currently not well compensated with regards to COPD This is likely reason for her shortness of breath    3. Colorectal cancer, stage IV ([place])  C19    This issue adds complexity to her management    4. Tobacco dependence due to cigarettes  F17.210    She has been advised to stop cigarette smoking     Meds ordered this encounter  Medications    Fluticasone-Umeclidin-Vilant (TRELEGY ELLIPTA) 200-62.5-25 MCG/INH AEPB    Sig: Inhale 200 mcg into the lungs daily.    Dispense:  28 each    Refill:  0    Order Specific Question:   Lot Number?    Answer:   [id number]    Order Specific Question:   Expiration Date?    Answer:   10/07/2021   Patient has poorly  compensated COPD, currently not on any in his medication.  She was advised that if she has any issues with her respiratory medication she needs to let us know.  Patient was also advised to discontinue all forms of tobacco use.  We will see her in follow-up in 2 months time she is to contact us prior to that time should any new difficulties arise.  Renold Don, MD Advanced Bronchoscopy P[facility]    *This note was dictated using voice recognition software/Dragon.  Despite best efforts to proofread, errors can occur which can change the meaning.  Any change was purely unintentional.

## 2021-01-01 ENCOUNTER — Inpatient Hospital Stay: Payer: Medicare HMO | Attending: Hematology and Oncology

## 2021-01-09 ENCOUNTER — Other Ambulatory Visit: Payer: Self-pay | Admitting: Hematology and Oncology

## 2021-01-09 DIAGNOSIS — K2100 Gastro-esophageal reflux disease with esophagitis, without bleeding: Secondary | ICD-10-CM

## 2021-01-19 ENCOUNTER — Other Ambulatory Visit: Payer: Self-pay | Admitting: Hematology and Oncology

## 2021-01-19 DIAGNOSIS — E876 Hypokalemia: Secondary | ICD-10-CM

## 2021-01-29 ENCOUNTER — Inpatient Hospital Stay: Payer: Medicare HMO | Attending: Hematology and Oncology

## 2021-02-01 ENCOUNTER — Other Ambulatory Visit: Payer: Medicare HMO

## 2021-02-01 ENCOUNTER — Ambulatory Visit: Payer: Medicare HMO | Admitting: Oncology

## 2021-02-05 ENCOUNTER — Other Ambulatory Visit: Payer: Self-pay

## 2021-02-05 DIAGNOSIS — C7802 Secondary malignant neoplasm of left lung: Secondary | ICD-10-CM

## 2021-02-05 DIAGNOSIS — C20 Malignant neoplasm of rectum: Secondary | ICD-10-CM

## 2021-02-05 DIAGNOSIS — C787 Secondary malignant neoplasm of liver and intrahepatic bile duct: Secondary | ICD-10-CM

## 2021-02-08 ENCOUNTER — Inpatient Hospital Stay: Payer: Medicare HMO | Attending: Hematology and Oncology

## 2021-02-08 ENCOUNTER — Inpatient Hospital Stay: Payer: Medicare HMO | Attending: Nurse Practitioner | Admitting: Oncology

## 2021-02-08 ENCOUNTER — Encounter: Payer: Self-pay | Admitting: Nurse Practitioner

## 2021-02-08 ENCOUNTER — Other Ambulatory Visit: Payer: Self-pay

## 2021-02-08 ENCOUNTER — Inpatient Hospital Stay (HOSPITAL_BASED_OUTPATIENT_CLINIC_OR_DEPARTMENT_OTHER): Payer: Medicare HMO | Admitting: Nurse Practitioner

## 2021-02-08 VITALS — BP 132/69 | HR 93 | Temp 96.4°F | Resp 20 | Wt 185.7 lb

## 2021-02-08 DIAGNOSIS — E876 Hypokalemia: Secondary | ICD-10-CM

## 2021-02-08 DIAGNOSIS — E538 Deficiency of other specified B group vitamins: Secondary | ICD-10-CM | POA: Diagnosis present

## 2021-02-08 DIAGNOSIS — C20 Malignant neoplasm of rectum: Secondary | ICD-10-CM

## 2021-02-08 DIAGNOSIS — C787 Secondary malignant neoplasm of liver and intrahepatic bile duct: Secondary | ICD-10-CM | POA: Diagnosis not present

## 2021-02-08 DIAGNOSIS — C7802 Secondary malignant neoplasm of left lung: Secondary | ICD-10-CM | POA: Diagnosis not present

## 2021-02-08 DIAGNOSIS — F17210 Nicotine dependence, cigarettes, uncomplicated: Secondary | ICD-10-CM | POA: Insufficient documentation

## 2021-02-08 LAB — CBC WITH DIFFERENTIAL/PLATELET
Abs Immature Granulocytes: 0.07 10*3/uL (ref 0.00–0.07)
Basophils Absolute: 0 10*3/uL (ref 0.0–0.1)
Basophils Relative: 0 %
Eosinophils Absolute: 0.1 10*3/uL (ref 0.0–0.5)
Eosinophils Relative: 1 %
HCT: 41.3 % (ref 36.0–46.0)
Hemoglobin: 14 g/dL (ref 12.0–15.0)
Immature Granulocytes: 1 %
Lymphocytes Relative: 10 %
Lymphs Abs: 1 10*3/uL (ref 0.7–4.0)
MCH: 30.7 pg (ref 26.0–34.0)
MCHC: 33.9 g/dL (ref 30.0–36.0)
MCV: 90.6 fL (ref 80.0–100.0)
Monocytes Absolute: 0.7 10*3/uL (ref 0.1–1.0)
Monocytes Relative: 7 %
Neutro Abs: 8.3 10*3/uL — ABNORMAL HIGH (ref 1.7–7.7)
Neutrophils Relative %: 81 %
Platelets: 312 10*3/uL (ref 150–400)
RBC: 4.56 MIL/uL (ref 3.87–5.11)
RDW: 13.2 % (ref 11.5–15.5)
WBC: 10.1 10*3/uL (ref 4.0–10.5)
nRBC: 0 % (ref 0.0–0.2)

## 2021-02-08 LAB — COMPREHENSIVE METABOLIC PANEL
ALT: 11 U/L (ref 0–44)
AST: 16 U/L (ref 15–41)
Albumin: 3.8 g/dL (ref 3.5–5.0)
Alkaline Phosphatase: 89 U/L (ref 38–126)
Anion gap: 7 (ref 5–15)
BUN: 12 mg/dL (ref 8–23)
CO2: 28 mmol/L (ref 22–32)
Calcium: 9.2 mg/dL (ref 8.9–10.3)
Chloride: 98 mmol/L (ref 98–111)
Creatinine, Ser: 0.77 mg/dL (ref 0.44–1.00)
GFR, Estimated: >60 mL/min (ref >60)
Glucose, Bld: 118 mg/dL — ABNORMAL HIGH (ref 70–99)
Potassium: 3.9 mmol/L (ref 3.5–5.1)
Sodium: 133 mmol/L — ABNORMAL LOW (ref 135–145)
Total Bilirubin: 0.2 mg/dL — ABNORMAL LOW (ref 0.3–1.2)
Total Protein: 7.3 g/dL (ref 6.5–8.1)

## 2021-02-08 MED ORDER — CYANOCOBALAMIN 1000 MCG/ML IJ SOLN
1000.0000 ug | Freq: Once | INTRAMUSCULAR | Status: AC
  Administered 2021-02-08: 1000 ug via INTRAMUSCULAR
  Filled 2021-02-08: qty 1

## 2021-02-08 NOTE — Progress Notes (Signed)
Pt needs a rx for omeprazole.

## 2021-02-08 NOTE — Progress Notes (Signed)
[facility]  [address] [place], [place] [address] Phone: [phone]  Fax: [phone]   Visit Date:  02/08/2021  Referring physician: Loni Muse, MD  Chief Complaint: Tamara Hall is a 65 y.o. 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  Hematology & Oncology History: Tamara Hall is a 65 y.o. female with clinical stage T3N1M1rectal cancer. She presented with a 9 month history of rectal bleeding. Colonoscopyon 01/14/2017 revealed afrond-like/villous non-obstructing large mass in the rectum. The mass was non-circumferential. Biopsies revealed high grade dysplasia within an adenoma (? sampling error). CEAwas 31.6 on 01/15/2017.  PET scanon 01/27/2017 revealed intense metabolic activity associated rectal mass consists with primary rectal carcinoma. There was a hypermetabolic metastatic lesion to the central LEFT hepatic lobe. There was metabolic activity associated with small LEFT lower lobe nodule which was most consistent with pulmonary metastasis.  CT guided liver biopsyat [facility] on 02/07/2017 revealed malignant cells c/w metastatic colorectal adenocarcinoma. [facility] on 12/29/2019 [phone]) revealed MS-stable, 1 Muts/Mb tumor mutational burden; KRAS G12C, PTEN rearrangement intron 8, APC G1106*, T1481f*18, and NRAS wildtype.  There were no reportable mutations in BRAF and NRAS.     She received 2 cycles of FOLFOX (02/18/2017 - 03/04/2017). She developed transient cold induced neuropathy. She developed wheezing and hypoxia with cycle #2 felt secondary to oxaliplatin. She received 4 cycles ofFOLFIRI(03/24/2017 - 04/22/2017) with Neulasta support. Diarrhea was controlled with Imodium.  She underwent CT guided microwave ablation of the liver lesionon 06/06/2017.   She began radiationon 06/18/2017. She  receiveddaily XMelody Haverradiation. Radiation and Xeloda completed on 08/15/2017.  She underwentAPR with descending colostomy and VRAM flapon 10/24/2017. She underwent a VATS wedge resectionto her LEFT lung. Pathology from the lung demonstrateda 1 cmmetastatic intestinal type adenocarcinoma c/w metastasis from patient's known colorectal carcinoma.Pathology from the rectal resection revealed a 2.1 cm invasive moderately differentiated adenocarcinoma arising in a tubular adenoma with transmural invasion focally into the perirectal soft tissue. There was no lymphovascular or perineural invasion. Margins were negative. Zero of 20 lymph nodes were positive. Final diagnosiswas [id number] adenocarcinoma with metastasis to left lower lobe of the lung.  She has had post-operative pain and ostomy ischemia. She underwent surgical ostomy revisionon 11/28/2017. Pathologyshowed focal mucosal erosion and fat necrosis c/w ischemia. There was extensive underlying fat necrosis. No dysplasia or carcinoma was identified. She was treated with oral Augmentin and wound packing. She has been treated with oxycodone 5 mg po q 4 hrs prn, gabapentin 900 mg TID. She was treated with Bactrim on 01/18/2018. She was referred to local pain management clinic on 01/27/2018.  [facility] for a smallbowel obstruction. She had severe abdominal pain with associated nausea and vomiting. Abdominal and pelvicCT revealedmultiple small bowel loops whichwere dilated.Obstruction resolved spontaneously.  PET scanon 11/29/2019 showed local recurrenceof hypermetabolic nodular lung malignancy at the resection margin(1.6 x 1.5 cm; SUV 11.3)in the LEFT lower lobe. There was no evidence of hepatic metastasis. There was no evidence of local recurrence in pelvis. There was insufficiency fracture in the LEFT sacral ala presumably related to radiation treatment.  Chest  CTon 12/13/2019 revealed anenlarging partially calcified2.3 x 1.2 cmlesion in the medial aspect of the left lower lobe most compatible with a pulmonary metastasis given the hypermetabolism on recent PET-CT.There were several other tiny pulmonary nodules measuring 6 mm or less in size, some of whichwere new or increased  in size compared to prior examinations.  She declined surgical resection.  Chest, abdomen and pelvis CT on 02/23/2020 revealed a stable 2.6 x 1.7 cm partially calcified left infrahilar pulmonary nodule, hypermetabolic on PET scan and likely metastasis.  There were new no new or enlarging pulmonary nodules.  There were stable treated hepatic metastatic disease with no evidence of progression or local recurrence.  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  There is no evidence of incarceration or obstruction.  There was mild wall thickening of multiple loops of small bowel in the mid abdomen, possibly related to prior radiation therapy.  There was stable chronic sacral insufficiency fractures bilaterally.  There was a stable 3.7 cm fusiform infrarenal abdominal aortic aneurysm.   She is s/p 1 cycle of FOLFIRI (03/08/2020) with Fulphila support.  Cycle #1 was notable for nausea, vomiting, and diarrhea.  She declined further chemotherapy.  She was admitted to [facility] from 03/22/2020 - 03/28/2020. She presented with an incarcerated hernia and underwent parastomal hernia repair with no bowel resection on 03/22/2020.  There was a 3.7 cm infrarenal abdominal aortic aneurysm with extensive mural thrombus, similar in size compared with previous examination, although degree of mural thrombus had increased.  She received IMRT (6000 cGy) to the left lung lesion from 05/10/2020 - 05/22/2020.   Chest, abdomen, and pelvis CT on 10/31/2020 revealed stable post treatment changes in pelvis and abdomen. Previously seen left lower quadrant parastomal hernia  was no longer visualized. There were no acute findings. There was a stable 6 mm right lower lobe pulmonary nodule. There was interval improvement in multifocal opacities in left upper and lower lobes, with several new areas of ground-glass opacity in right upper and middle lobes. These findings favored a waxing and waning inflammatory process.   CEAhas been followed: 31.6 on 01/15/2017, 38.1 on 02/18/2017, 25.1 on 03/18/2017, 11.8 on 04/08/2017, 9.2 on 04/22/2017, 9.5 on 05/20/2017, 9.6 on 06/19/2017, 3.7 on 02/18/2018,5.8 on 06/26/2018, 10.5 on 11/20/2018, 19.5 on 03/26/2019, 34.0 on 07/21/2019, 46.4 on 09/09/2019, 54.4 on 10/28/2019, 108.0 on 02/10/2020, 121.0 on 03/08/2020, 49.8 on 04/13/2020, 76.7 on 05/29/2020, 40.9 on 06/13/2020, 7.7 on 08/07/2020, and 9.5 on 10/31/2020.  Colonoscopy at [facility] 09/13/2019 did not appear malignant with no adenomatous (granulation tissue). There was diverticulosis in the descending colon, normal mucosa in the entire examined colon and the examined portion of the ileum. There was a 9 mmlipoma in the transverse colon anda4 mm polyp at the surgical stoma.   She has a B12 deficiency.[id number] (low)on 12/03/2019. She restarted B12 injections on 12/13/2019 (last 10/31/2020).  She has folate deficiency.  Folate was 5.8 (low) on 12/03/2019 and 44.0 on 09/27/2020.  She is on folic acid.    She has chronic hypokalemia.  She is on potassium 50 meq po q day.  She received the first dose of the Moderna COVID-19 vaccine on 08/28/2020.   HPI: Patient returns to clinic for further evaluation.  He continues to feel well.  She has some wheezing and coughing.  She continues to use her inhalers as prescribed.  She is not interested in additional treatment.  She denies significant weight loss.  Appetite is stable.  She reports good quality of life.  No constipation or diarrhea.  Has persistent pain in her abdomen which is managed by Trudee Grip at [place]. She takes  Norco.  She denies specific complaints.    Past Medical History:  Diagnosis Date  . Cancer ([place])  colon, with mets to liver and left lung  . Hypertension   . Pneumonia    20+ years ago    Past Surgical History:  Procedure Laterality Date  . ABDOMINAL HYSTERECTOMY    . BREAST SURGERY    . COLON SURGERY  10/2017   [facility]  . COLONOSCOPY WITH PROPOFOL N/A 01/14/2017   Procedure: COLONOSCOPY WITH PROPOFOL;  Surgeon: Lucilla Lame, MD;  Location: [facility] ENDOSCOPY;  Service: Endoscopy;  Laterality: N/A;  . ELBOW SURGERY Right    Has had 4 surgeries on right elbow.  Marland Kitchen HAND SURGERY Right 2008  . JOINT REPLACEMENT    . LIVER BIOPSY  10/2017  . lung wedge resection  10/2017  . PORTA CATH INSERTION N/A 01/29/2017   Procedure: Glori Luis Cath Insertion;  Surgeon: Algernon Huxley, MD;  Location: [place] [facility];  Service: Cardiovascular;  Laterality: N/A;  . TONSILLECTOMY      Family History  Problem Relation Age of Onset  . Lung cancer Mother 30  . Esophageal cancer Father 62  . Brain cancer Maternal Grandmother     Social History:  reports that she has been smoking cigarettes. She has a 60.00 pack-year smoking history. She has never used smokeless tobacco. She reports that she does not drink alcohol and does not use drugs.She lives in [place]. She has 3 children (ages 25, 13, and 33). She smoked 2 packs a day. She started smoking at age 81. She initially stopped smoking on 10/24/2017. She now smokes 3 cigarettes a day and is trying to quit. 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 [name] is her significant other. The patient is alone today.   Allergies: No Known Allergies  Current Medications: Current Outpatient Medications  Medication Sig Dispense Refill  . albuterol (VENTOLIN HFA) 108 (90 Base) MCG/ACT inhaler Inhale 1-2 puffs into the lungs every 6 (six) hours as needed for wheezing or shortness of breath.     Marland Kitchen amLODipine (NORVASC)  5 MG tablet Take 5 mg by mouth daily.    . baclofen (LIORESAL) 10 MG tablet Take 10 mg by mouth 3 (three) times daily.    . Calcium Carbonate (CALCIUM 500 PO) Take 1 tablet by mouth daily.    . Fluticasone-Umeclidin-Vilant (TRELEGY ELLIPTA) 200-62.5-25 MCG/INH AEPB Inhale 200 mcg into the lungs daily. 28 each 0  . folic acid (FOLVITE) 1 MG tablet TAKE 1 TABLET BY MOUTH EVERY DAY 90 tablet 1  . gabapentin (NEURONTIN) 300 MG capsule Take 1,200 mg by mouth 3 (three) times daily.     . hydrochlorothiazide (HYDRODIURIL) 25 MG tablet Take 25 mg by mouth daily.    Marland Kitchen HYDROcodone-acetaminophen (NORCO) 10-325 MG tablet SMARTSIG:1 Tablet(s) By Mouth 4-5 Times Daily    . KLOR-CON M20 20 MEQ tablet TAKE 1 TABLET BY MOUTH THREE TIMES A DAY 90 tablet 1  . nortriptyline (PAMELOR) 50 MG capsule Take 50 mg by mouth at bedtime.    Marland Kitchen omeprazole (PRILOSEC) 20 MG capsule TAKE 1 CAPSULE BY MOUTH EVERY DAY 30 capsule 1  . Ostomy Supplies (PREMIER DRAINABLE POUCH 64MM) Pouch MISC     . Budeson-Glycopyrrol-Formoterol (BREZTRI AEROSPHERE) 160-9-4.8 MCG/ACT AERO Inhale 2 puffs into the lungs in the morning and at bedtime. (Patient not taking: No sig reported) 10.7 g 0  . doxycycline (VIBRA-TABS) 100 MG tablet Take 1 tablet (100 mg total) by mouth 2 (two) times daily. (Patient not taking: No sig reported) 20 tablet  0  . montelukast (SINGULAIR) 10 MG tablet Take 1 tablet (10 mg total) by mouth at bedtime. (Patient not taking: Reported on 02/08/2021) 30 tablet 11   No current facility-administered medications for this visit.    Review of Systems  Constitutional: Positive for malaise/fatigue. Negative for chills, fever and weight loss.  HENT: Negative for hearing loss, nosebleeds, sore throat and tinnitus.   Eyes: Negative for blurred vision and double vision.  Respiratory: Positive for cough, sputum production, shortness of breath and wheezing. Negative for hemoptysis.   Cardiovascular: Negative for chest pain, palpitations  and leg swelling.  Gastrointestinal: Positive for abdominal pain. Negative for blood in stool, constipation, diarrhea, melena, nausea and vomiting.  Genitourinary: Negative for dysuria and urgency.  Musculoskeletal: Negative for back pain, falls, joint pain and myalgias.  Skin: Negative for itching and rash.  Neurological: Negative for dizziness, tingling, sensory change, loss of consciousness, weakness and headaches.  Endo/Heme/Allergies: Negative for environmental allergies. Does not bruise/bleed easily.  Psychiatric/Behavioral: Negative for depression. The patient is not nervous/anxious and does not have insomnia.    Performance status (ECOG): 2  Vitals Blood pressure 132/69, pulse 93, temperature (!) 96.4 F (35.8 C), resp. rate 20, weight 185 lb 11.8 oz (84.2 kg), SpO2 95 %.   Imaging studies: 01/13/2017:Abdomen and pelvic CTrevealed irregular thickening of the rectum suspicious for carcinoma. There was a 1.6 cm hypoattenuating lesion in the right hepatic lobe worrisome for metastatic disease. 01/15/2017:Chest CT revealed a 9 mm noncalcified left lower lobe pulmonary nodule (new). 01/16/2017:Pelvic MRIrevealed rectal adenocarcinoma T3N1. There was extension beyond the muscularis propria (17 mm). There were mesorectal nodes >= 5 mm. There was adenopathy on the left side of the mesorectum, including a 9 mm lesion. The distance from the tumor to the anal sphincter was 8 mm. 01/27/2017:PET scanrevealed intense metabolic activity associated rectal mass consists with primary rectal carcinoma. There was a hypermetabolic metastatic lesion to the central LEFT hepatic lobe. There was metabolic activity associated with small LEFT lower lobe nodule which was most consistent with pulmonary metastasis. 01/31/2017:Liver MRIrevealed a 2.6 x 2.7 x 2.1 cm lesion in segment VIII of the liver near the junction with segment IVa c/w a metastatic lesion. There was a 1.1 x 0.8 cm suspected  metastatic lesion peripherally in segment VI of the liver.  05/16/2017:Abdomen and pelvic CTrevealed persistent but decreased asymmetric wall thickening in the rectum. The medial segment left liver lesion was smaller (2.7 x 2.6 cm to 1.9 x 1.6 cm). A tiny lesion seen in segment VI of the liver on previous MRI was not evident on today's CT scan. The left lower lobe pulmonary nodule was smaller (9 mm to 6 mm). There was no new or progressive findings in the abdomen and pelvis. 08/19/2017:Chest CTwith contrast on 08/19/2017 revealed left lower lobe 6 mm solid pulmonary nodule, decreased since 01/15/2017 chest CT. Stable 7 mm right lower lobe pulmonary nodule. There was no new or progressive metastatic disease in the chest.  09/03/2018: Pelvic MRI at [facility] demonstrated a partial response of the primary tumor and extramural disease. Post treatment category ymrT T3b. There was an overall decrease in size in the previously seen perirectal node. Hypointense changes at the site of prior tumor focally contacedt buts does not definitely invade the LEFT levator ani. Tumor contacted, but does not definitely invade the internal anal sphincter. No evidence of residual or recurrent hepatic disease following microwave ablation. 02/17/2018:Abdomen and pelvic CTrevealed evolution of the ablation zone in segment 4A, now measuring  3.7 x 3.1 cm, decreased.There was no findings to suggest viable tumor.She is s/pabdominoperineal resection with left lower quadrant colostomy. There was no evidence of new/progressive metastatic disease.She is s/pleft lower lobe pulmonary wedge resection, incompletely visualized. 03/04/2018:Chest CTrevealed a stable chest CT (compared to11/13/2018). The index right lower lobe lung nodule measured7 mm (unchanged). There was no new or progressive nodularity identified. There was continued decrease in size of ablation defect within segment 4a of the  liver. 08/13/2019:Abdominaland [facility] of parastomal hernia with small bowel as well as multiple small bowel loops whichwere dilated.There was hepatic steatosis and post-treatment changes in the pelvis with persistent presacral soft tissue. There was a 3.5 cm infrarenal abdominal aortic aneurysm. 09/09/2019:Chest CT at [facility] no intrathoracic metastasis and no new or enlarging pulmonary nodules. 09/09/2019:Abdomen and pelvis CT at [facility] multiple subcentimeter hypodensities in the dome of the liver, which appeared new/more conspicuous from prior CT. There were not seen on the prior MRI. Further evaluation with MRI was recommended. CT evaluationwas limited due to background fatty liver and the heterogeneity/these lesions could have been secondary to fatty liver. There was unchanged soft tissue stranding and nodularity in the pelvis, with a few prominent more nodular areas. There were a few more prominent indeterminateareasand short-term follow up was recommended to ensure stability and exclude peritoneal disease. Further evaluation with MRI could have been helpful. 11/12/2019: Liver and pelvis MRIrevealedno findings to explain the patient's history of rising CEA. There was irregular presacral soft tissue with apparent scarring noted in the left paracentral pelviswas similar to prior studies and presumably treatment related. PET-CT could be used, as clinically warranted to exclude hypermetabolic disease within thatregion. There was an ablation defect identified in segment IV with minimal irregular enhancement along the anterosuperior margin, indeterminate. Attention on follow-up recommended.There was a tiny cyst identified in the dome of the right liver without other findings to suggest metastatic disease in the hepatic parenchyma. 11/29/2019:PET scanrevealed 1.6 x 1.5 cmnodularthickeningat the resection margin (SUV 11.3)in the LEFT lower lobe.  There was no evidence of hepatic metastasis. There was no evidence of local recurrence in pelvis. There was insufficiency fracture in the LEFT sacral ala presumably related to radiation treatment. 02/23/2020:  Chest, abdomen and pelvis CT revealed a stable 2.6 x 1.7 cm partially calcified left infrahilar pulmonary nodule, hypermetabolic on PET scan and likely metastasis.  There were new no new or enlarging pulmonary nodules.  There were stable treated hepatic metastatic disease with no evidence of progression or local recurrence.  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  There is no evidence of incarceration or obstruction.  There was mild wall thickening of multiple loops of small bowel in the mid abdomen, possibly related to prior radiation therapy.  There was stable chronic sacral insufficiency fractures bilaterally.  There was a stable 3.7 cm fusiform infrarenal abdominal aortic aneurysm.  03/22/2020:  Abdomen and pelvis CT at [facility] revealed a high-grade small bowel obstruction. There were 2 separate loops of small bowel contained within the parastomal hernia. The first was more proximal small bowel which appeared dilated but nonobstructed. The second segment of included small bowel was more distal small bowel which appeared dilated entering into the herniaand collapsed exiting the hernia, consistent with point of obstruction.  There was a 3.7 cm infrarenal abdominal aortic aneurysm with extensive mural thrombus, similar in size compared with previous examination, although degree of mural thrombus had increased. Additional calcified atherosclerotic plaque was noted throughout the  abdominal aorta and its branches.  08/22/2020:  Chest CT with contrast revealed mild reduction in size of the left infrahilar peribronchovascular nodule (1.2 x 2.2 cm, previously 1.5 x 2.3 cm. There was patchy ground-glass opacity in the left upper lobe and lingula as well as the  left lower lobe, favoring radiation pneumonitis given the patient's radiation therapy, atypical infection was a less likely differential diagnostic consideration. There was coronary, aortic arch, and branch vessel atherosclerotic vascular disease, old granulomatous disease, and ablation defect in the left hepatic lobe. 10/31/2020:  Chest, abdomen, and pelvis CT revealed stable post treatment changes in pelvis and abdomen. Previously seen left lower quadrant parastomal hernia was no longer visualized. There were no acute findings. There was a stable 6 mm right lower lobe pulmonary nodule. There was interval improvement in multifocal opacities in left upper and lower lobes, with several new areas of ground-glass opacity in right upper and middle lobes. These findings favored a waxing and waning inflammatory process. Recommended continued attention on follow-up CT. There was aortic atherosclerosis. Addendum: 3.8 cm infrarenal AAA is stable.    Appointment on 02/08/2021  Component Date Value Ref Range Status  . WBC 02/08/2021 10.1  4.0 - 10.5 K/uL Final  . RBC 02/08/2021 4.56  3.87 - 5.11 MIL/uL Final  . Hemoglobin 02/08/2021 14.0  12.0 - 15.0 g/dL Final  . HCT 02/08/2021 41.3  36.0 - 46.0 % Final  . MCV 02/08/2021 90.6  80.0 - 100.0 fL Final  . MCH 02/08/2021 30.7  26.0 - 34.0 pg Final  . MCHC 02/08/2021 33.9  30.0 - 36.0 g/dL Final  . RDW 02/08/2021 13.2  11.5 - 15.5 % Final  . Platelets 02/08/2021 312  150 - 400 K/uL Final  . nRBC 02/08/2021 0.0  0.0 - 0.2 % Final  . Neutrophils Relative % 02/08/2021 81  % Final  . Neutro Abs 02/08/2021 8.3* 1.7 - 7.7 K/uL Final  . Lymphocytes Relative 02/08/2021 10  % Final  . Lymphs Abs 02/08/2021 1.0  0.7 - 4.0 K/uL Final  . Monocytes Relative 02/08/2021 7  % Final  . Monocytes Absolute 02/08/2021 0.7  0.1 - 1.0 K/uL Final  . Eosinophils Relative 02/08/2021 1  % Final  . Eosinophils Absolute 02/08/2021 0.1  0.0 - 0.5 K/uL Final  . Basophils Relative  02/08/2021 0  % Final  . Basophils Absolute 02/08/2021 0.0  0.0 - 0.1 K/uL Final  . Immature Granulocytes 02/08/2021 1  % Final  . Abs Immature Granulocytes 02/08/2021 0.07  0.00 - 0.07 K/uL Final   Performed at [facility], [address] [place], [place] [address]    Assessment:  Tamara Hall is a 65 y.o. 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  1.Stage IV rectal carcinoma s/p 6cycles of FOLFOX(02/18/2017 -04/22/2017). CT guided microwave ablation of the liver lesionon 06/06/2017. Radiationand daily Xeloda (06/18/17 - 08/15/17). APR with descending colostomy and VRAM flapon 10/24/2017. She underwent a VATS wedge resection of her left lung. PET scan on 11/29/2019 revealed recurrent disease at the lung resection margin- declined left lower lobe lobectomy. Received 1 cycle of FOLFIRI (03/08/2020). She declined further chemotherapy or surgery. She completed SBRT to the left infrahilar nodule on 05/22/2020. CEA rising.  Last imaging was January 2022 which showed stable posttreatment changes in the pelvis and abdomen interval improvement and left upper and lower lobes there are new areas of  groundglass opacity in the right upper and middle lobes.  Findings favored to reveal a waxing and waning inflammatory process.  Patient declined additional treatment and wishes to continue with surveillance and supportive care.  She is not interested in additional treatment.  She is followed by pain medicine at [facility] but we discussed role of palliative medicine today and she is agreeable to referral. She lives in [place] and may be seen at [place].   2.  B12 and folate deficiency-continue oral supplementation.  Proceed with B12 today and monthly.  We will check B12 level in 3 months.  3.  Hypokalemia-secondary to hydrochlorothiazide. Continue supplementation  Plan B12 today. Repeat  monthly.  Refer to palliative care/Josh Borders, NP RTC in 3 months for MD & b12. Patient can opt out of surveillance at any time or can be followed by palliative medicine. Can consider imaging at any time.

## 2021-02-09 LAB — CEA: CEA: 24.6 ng/mL — ABNORMAL HIGH (ref 0.0–4.7)

## 2021-02-14 ENCOUNTER — Other Ambulatory Visit: Payer: Self-pay | Admitting: Nurse Practitioner

## 2021-02-14 DIAGNOSIS — E876 Hypokalemia: Secondary | ICD-10-CM

## 2021-02-15 NOTE — Progress Notes (Signed)
CEA is up. Looks like hospice/palliative care and no treatment per your note. Just wanted to make sure we didn't need any additional imaging.   Faythe Casa, NP 02/15/2021 11:23 AM

## 2021-02-26 ENCOUNTER — Inpatient Hospital Stay: Payer: Medicare HMO

## 2021-03-02 ENCOUNTER — Encounter: Payer: Self-pay | Admitting: Hematology and Oncology

## 2021-03-09 ENCOUNTER — Encounter: Payer: Self-pay | Admitting: Hematology and Oncology

## 2021-03-13 ENCOUNTER — Inpatient Hospital Stay: Payer: Medicare HMO | Attending: Hematology and Oncology

## 2021-03-21 ENCOUNTER — Other Ambulatory Visit: Payer: Self-pay | Admitting: Nurse Practitioner

## 2021-03-21 DIAGNOSIS — E876 Hypokalemia: Secondary | ICD-10-CM

## 2021-03-22 ENCOUNTER — Ambulatory Visit: Payer: Medicare HMO | Admitting: Pulmonary Disease

## 2021-03-27 ENCOUNTER — Other Ambulatory Visit: Payer: Self-pay

## 2021-03-27 DIAGNOSIS — K2100 Gastro-esophageal reflux disease with esophagitis, without bleeding: Secondary | ICD-10-CM

## 2021-03-28 ENCOUNTER — Encounter: Payer: Self-pay | Admitting: Hematology and Oncology

## 2021-03-28 MED ORDER — OMEPRAZOLE 20 MG PO CPDR: DELAYED_RELEASE_CAPSULE | ORAL | 1 refills | Status: DC

## 2021-04-02 ENCOUNTER — Inpatient Hospital Stay: Payer: Medicare HMO

## 2021-04-12 ENCOUNTER — Inpatient Hospital Stay: Payer: Medicare HMO | Attending: Hematology and Oncology

## 2021-04-20 ENCOUNTER — Other Ambulatory Visit: Payer: Self-pay | Admitting: Oncology

## 2021-04-20 ENCOUNTER — Other Ambulatory Visit: Payer: Self-pay | Admitting: Nurse Practitioner

## 2021-04-20 DIAGNOSIS — K2100 Gastro-esophageal reflux disease with esophagitis, without bleeding: Secondary | ICD-10-CM

## 2021-04-20 DIAGNOSIS — E876 Hypokalemia: Secondary | ICD-10-CM

## 2021-04-20 MED ORDER — POTASSIUM CHLORIDE CRYS ER 20 MEQ PO TBCR: 20.0000 meq | EXTENDED_RELEASE_TABLET | Freq: Three times a day (TID) | ORAL | 2 refills | Status: DC

## 2021-04-20 NOTE — Telephone Encounter (Signed)
Not sure why we need to prescribe omeprazole. She will need to discuss with her pcp or gi if this needs to be continued and they should prescribe it

## 2021-04-30 ENCOUNTER — Inpatient Hospital Stay: Payer: Medicare HMO

## 2021-05-01 ENCOUNTER — Encounter: Payer: Self-pay | Admitting: Hematology and Oncology

## 2021-05-10 ENCOUNTER — Other Ambulatory Visit: Payer: Self-pay

## 2021-05-10 ENCOUNTER — Encounter: Payer: Self-pay | Admitting: Pulmonary Disease

## 2021-05-10 ENCOUNTER — Ambulatory Visit (INDEPENDENT_AMBULATORY_CARE_PROVIDER_SITE_OTHER): Payer: Medicare HMO | Admitting: Pulmonary Disease

## 2021-05-10 ENCOUNTER — Telehealth: Payer: Self-pay | Admitting: Pulmonary Disease

## 2021-05-10 VITALS — BP 118/70 | HR 93 | Temp 98.3°F | Ht 67.0 in | Wt 182.6 lb

## 2021-05-10 DIAGNOSIS — C19 Malignant neoplasm of rectosigmoid junction: Secondary | ICD-10-CM | POA: Diagnosis not present

## 2021-05-10 DIAGNOSIS — J449 Chronic obstructive pulmonary disease, unspecified: Secondary | ICD-10-CM

## 2021-05-10 DIAGNOSIS — R0602 Shortness of breath: Secondary | ICD-10-CM | POA: Diagnosis not present

## 2021-05-10 DIAGNOSIS — F17210 Nicotine dependence, cigarettes, uncomplicated: Secondary | ICD-10-CM | POA: Diagnosis not present

## 2021-05-10 MED ORDER — BUDESONIDE 0.5 MG/2ML IN SUSP: 0.5000 mg | Freq: Two times a day (BID) | RESPIRATORY_TRACT | 11 refills | Status: DC

## 2021-05-10 MED ORDER — IPRATROPIUM-ALBUTEROL 0.5-2.5 (3) MG/3ML IN SOLN: 3.0000 mL | Freq: Four times a day (QID) | RESPIRATORY_TRACT | 11 refills | Status: DC | PRN

## 2021-05-10 NOTE — Telephone Encounter (Signed)
Spoke to patient, who is questioning if she should stop Singulair, as she does not notice a difference.   Per Dr. Patsey Berthold verbally- okay to stop.  Patient is aware and voiced her understanding.   Nothing further needed at this time.

## 2021-05-10 NOTE — Progress Notes (Signed)
Subjective:    Patient ID: Tamara Hall, female    DOB: [birth year], 65 y.o.   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  She follows here for the issue of COPD with asthmatic bronchitic component.  We previously had treated her with Judithann Sauger which she felt helped her with her symptoms however did cause her to have issues with "sores" in her mouth.  This despite rinsing aggressively after use.  She also has difficulties with breath-holding capacity.  She has not had any recent fevers, chills or sweats.  She continues to smoke anywhere between 2 to 10 cigarettes a day.  She continues to follow-up with oncology for her rectal cancer issues.  She has not had increasing shortness of breath over her baseline.  She has had cough mostly in the mornings productive of whitish sputum.  She does not endorse any other complaint today.  Weight and appetite have been stable.  She has had some increased anxiety over issues with her husband's illness.   Review of Systems A 10 point review of systems was performed and it is as noted above otherwise negative.  Patient Active Problem List   Diagnosis Date Noted   Palliative care encounter    Acute respiratory failure with hypoxia ([place]) 06/07/2021   Stage 3 severe COPD by GOLD classification ([place]) 11/16/2020   Shortness of breath 09/10/2020   Ground glass opacity present on imaging of lung 08/30/2020   Multifocal pneumonia 08/30/2020   Gastroesophageal reflux disease with esophagitis 06/04/2020   Folate deficiency 12/04/2019   B12 deficiency 11/15/2019   H/O small bowel obstruction 10/29/2019   Pain in pelvis 10/28/2019   SBO (small bowel obstruction) ([place]) 08/13/2019   Diarrhea 07/11/2017   Hypokalemia  07/08/2017   Malignant neoplasm metastatic to left lung ([place]) 05/27/2017   Nausea without vomiting 05/06/2017   Liver metastases ([place]) 02/25/2017   Goals of care, counseling/discussion 02/18/2017   Encounter for antineoplastic chemotherapy 02/18/2017   Cancer related pain 02/16/2017   Tobacco use 02/06/2017   Abnormal findings-gastrointestinal tract    Rectum neoplasm    Rectal bleeding 01/13/2017   Rectal cancer ([place])    Social History   Tobacco Use   Smoking status: Every Day    Packs/day: 1.50    Years: 40.00    Pack years: 60.00    Types: Cigarettes   Smokeless tobacco: Never   Tobacco comments:    2 cigs daily-05/10/2021  Substance Use Topics   Alcohol use: No   No Known Allergies  Current Meds  Medication Sig   albuterol (VENTOLIN HFA) 108 (90 Base) MCG/ACT inhaler Inhale 1-2 puffs into the lungs every 6 (six) hours as needed for wheezing or shortness of breath.    amLODipine (NORVASC) 5 MG tablet Take 5 mg by mouth daily.   baclofen (LIORESAL) 10 MG tablet Take 10 mg by mouth 3 (three) times daily.   Calcium Carbonate (CALCIUM 500 PO) Take 1 tablet by mouth daily.   gabapentin (NEURONTIN) 300 MG capsule Take 1,200 mg by mouth 3 (three) times daily.    HYDROcodone-acetaminophen (NORCO) 10-325 MG tablet Take 1 tablet by mouth every 4 (four) hours as needed for moderate pain or severe pain.   nortriptyline (PAMELOR) 50 MG capsule  Take 50 mg by mouth at bedtime.   omeprazole (PRILOSEC) 20 MG capsule TAKE 1 CAPSULE BY MOUTH EVERY DAY   Ostomy Supplies (PREMIER DRAINABLE POUCH 64MM) Pouch MISC    [DISCONTINUED] budesonide (PULMICORT) 0.5 MG/2ML nebulizer solution Take 2 mLs (0.5 mg total) by nebulization 2 (two) times daily.   [DISCONTINUED] folic acid (FOLVITE) 1 MG tablet TAKE 1 TABLET BY MOUTH EVERY DAY   [DISCONTINUED] hydrochlorothiazide (HYDRODIURIL) 25 MG tablet Take 25 mg by mouth daily.   [DISCONTINUED] ipratropium-albuterol (DUONEB) 0.5-2.5 (3) MG/3ML SOLN Take 3  mLs by nebulization every 6 (six) hours as needed. (Patient not taking: No sig reported)   [DISCONTINUED] montelukast (SINGULAIR) 10 MG tablet Take 1 tablet (10 mg total) by mouth at bedtime.   [DISCONTINUED] potassium chloride SA (KLOR-CON M20) 20 MEQ tablet Take 1 tablet (20 mEq total) by mouth 3 (three) times daily. (Patient not taking: Reported on 08/10/2021)   Immunization History  Administered Date(s) Administered   DTaP 09/10/1996   Influenza,inj,Quad PF,6+ Mos 08/05/2017, 07/08/2018, 07/21/2019, 08/07/2020   Influenza-Unspecified 08/05/2017, 08/07/2020,    Moderna Sars-Covid-2 Vaccination 08/28/2020, 09/26/2020, 05/08/2021   Pneumococcal Conjugate-13 10/17/2020   Pneumococcal Polysaccharide-23 02/06/2017       Objective:   Physical Exam BP 118/70 (BP Location: Left Arm, Patient Position: Sitting, Cuff Size: Normal)   Pulse 93   Temp 98.3 F (36.8 C) (Oral)   Ht 5\' 7"  (1.702 m)   Wt 182 lb 9.6 oz (82.8 kg)   SpO2 94%   BMI 28.60 kg/m  GENERAL: Well-developed well-nourished woman in no acute distress.  Occasional dry cough noted.  Uses walker for ambulation.  Raspy voice HEAD: Normocephalic, atraumatic.  EYES: Pupils equal, round, reactive to light.  No scleral icterus.  MOUTH: Nose/mouth/throat not examined due to masking requirements for COVID 19. NECK: Supple. No thyromegaly. Trachea midline. No JVD.  No adenopathy. PULMONARY: Good air entry bilaterally.  She has scattered rhonchi and end expiratory wheezes throughout.   CARDIOVASCULAR: S1 and S2. Regular rate and rhythm.  No rubs, murmurs or gallops heard. ABDOMEN: Benign. MUSCULOSKELETAL: No joint deformity, no clubbing, no edema.  NEUROLOGIC: No focal deficits, speech is fluent.  Gait assisted with walker. SKIN: Intact,warm,dry.  PSYCH: Mood and behavior normal     Assessment & Plan:     ICD-10-CM   1. Stage 3 severe COPD by GOLD classification ([place])  J44.9 AMB REFERRAL FOR DME   Switching to nebulizer  treatments Patient unable to use Breztri Follow-up 3 months    2. Shortness of breath  R06.02 AMB REFERRAL FOR DME   Related to poorly compensated COPD Switched to nebulizer treatments    3. Colorectal cancer, stage IV ([place])  C19    This issue adds complexity to her management    4. Tobacco dependence due to cigarettes  F17.210    Encouraged to discontinue ALL cigarette use     Orders Placed This Encounter  Procedures   AMB REFERRAL FOR DME    Referral Priority:   Routine    Referral Type:   Durable Medical Equipment Purchase    Number of Visits Requested:   1   Meds ordered this encounter  Medications    ipratropium-albuterol (DUONEB) 0.5-2.5 (3) MG/3ML SOLN    Sig: Take 3 mLs by nebulization every 6 (six) hours as needed.    Dispense:  360 mL    Refill:  11   budesonide (PULMICORT) 0.5 MG/2ML nebulizer solution    Sig: Take 2 mLs (0.5  mg total) by nebulization 2 (two) times daily.    Dispense:  120 mL    Refill:  11   Patient has been unable to use Breztri consistently due to "sores" in her tongue, this has been the inhaler that has worked the best for her.  In addition it appears that she is having difficulties with breath-holding capacity.  We will switch her to nebulizers in the form of DuoNeb and Pulmicort.  We will see her in follow-up in 3 months time she is to contact us prior to that time should any new problems arise.  Renold Don, MD Advanced Bronchoscopy PCCM [facility]    *This note was dictated using voice recognition software/Dragon.  Despite best efforts to proofread, errors can occur which can change the meaning.  Any change was purely unintentional.

## 2021-05-10 NOTE — Progress Notes (Signed)
E 

## 2021-05-10 NOTE — Patient Instructions (Signed)
We are going to switch you to a nebulizer treatments  You will do DuoNeb (ipratropium/albuterol) 1 vial via the nebulizer 4 times a day.  You will follow with Pulmicort (budesonide) 1 vial twice a day after one of your DuoNeb treatments.  Do not mix the DuoNeb and the Pulmicort together as they do tend to foam.  Do rinse your mouth well particularly after you use the Pulmicort (budesonide).  You can still use your rescue inhaler as needed.   See you in follow-up in 3 months time call sooner should any new problems arise.

## 2021-05-11 ENCOUNTER — Ambulatory Visit: Payer: Medicare HMO | Admitting: Nurse Practitioner

## 2021-05-11 ENCOUNTER — Ambulatory Visit: Payer: Medicare HMO

## 2021-05-14 ENCOUNTER — Telehealth: Payer: Self-pay | Admitting: Pulmonary Disease

## 2021-05-14 NOTE — Telephone Encounter (Signed)
Called the pt and there was no answer- LMTCB    

## 2021-05-15 NOTE — Telephone Encounter (Signed)
Called and spoke to pt. Pt states her Budesonide is too expensive, just over $100. Pt states insurance is still covering part of the med. Pt is ok using another pharmacy or supplier.    Dr. Patsey Berthold, please advise if ok to send through [facility] to see if med can be cheaper for pt. Thanks.

## 2021-05-16 ENCOUNTER — Telehealth: Payer: Self-pay | Admitting: Oncology

## 2021-05-16 MED ORDER — BUDESONIDE 0.5 MG/2ML IN SUSP: 0.5000 mg | Freq: Two times a day (BID) | RESPIRATORY_TRACT | 11 refills | Status: DC

## 2021-05-16 NOTE — Telephone Encounter (Signed)
Patient called to cancel her appointment on 8/11. She stated that her husband is ill and currently in the hospital. She would like to call back when ready to reschedule.

## 2021-05-16 NOTE — Telephone Encounter (Signed)
Called and spoke to pt. Informed her we have sent the script to [facility] and to expect a call from them. Will keep encounter open to follow back up to assure pt gets medication.

## 2021-05-17 ENCOUNTER — Ambulatory Visit: Payer: Medicare HMO

## 2021-05-17 ENCOUNTER — Ambulatory Visit: Payer: Medicare HMO | Admitting: Oncology

## 2021-05-18 ENCOUNTER — Ambulatory Visit: Payer: Medicare HMO | Admitting: Nurse Practitioner

## 2021-05-18 ENCOUNTER — Ambulatory Visit: Payer: Medicare HMO

## 2021-05-25 NOTE — Telephone Encounter (Signed)
Lm for patient.  

## 2021-05-28 ENCOUNTER — Other Ambulatory Visit: Payer: Self-pay

## 2021-05-28 ENCOUNTER — Inpatient Hospital Stay: Payer: Medicare HMO

## 2021-05-28 ENCOUNTER — Inpatient Hospital Stay: Payer: Medicare HMO | Attending: Hematology and Oncology | Admitting: Internal Medicine

## 2021-05-28 ENCOUNTER — Telehealth: Payer: Self-pay | Admitting: *Deleted

## 2021-05-28 ENCOUNTER — Encounter: Payer: Self-pay | Admitting: Internal Medicine

## 2021-05-28 VITALS — BP 132/54 | HR 97 | Temp 95.0°F | Resp 18 | Ht 67.0 in | Wt 181.4 lb

## 2021-05-28 DIAGNOSIS — Z800 Family history of malignant neoplasm of digestive organs: Secondary | ICD-10-CM | POA: Diagnosis not present

## 2021-05-28 DIAGNOSIS — J9811 Atelectasis: Secondary | ICD-10-CM | POA: Diagnosis not present

## 2021-05-28 DIAGNOSIS — F17210 Nicotine dependence, cigarettes, uncomplicated: Secondary | ICD-10-CM | POA: Insufficient documentation

## 2021-05-28 DIAGNOSIS — Z808 Family history of malignant neoplasm of other organs or systems: Secondary | ICD-10-CM | POA: Diagnosis not present

## 2021-05-28 DIAGNOSIS — C787 Secondary malignant neoplasm of liver and intrahepatic bile duct: Secondary | ICD-10-CM | POA: Insufficient documentation

## 2021-05-28 DIAGNOSIS — M2550 Pain in unspecified joint: Secondary | ICD-10-CM | POA: Insufficient documentation

## 2021-05-28 DIAGNOSIS — M549 Dorsalgia, unspecified: Secondary | ICD-10-CM | POA: Diagnosis not present

## 2021-05-28 DIAGNOSIS — R0789 Other chest pain: Secondary | ICD-10-CM

## 2021-05-28 DIAGNOSIS — Z7952 Long term (current) use of systemic steroids: Secondary | ICD-10-CM | POA: Insufficient documentation

## 2021-05-28 DIAGNOSIS — C20 Malignant neoplasm of rectum: Secondary | ICD-10-CM | POA: Diagnosis not present

## 2021-05-28 DIAGNOSIS — Z79899 Other long term (current) drug therapy: Secondary | ICD-10-CM | POA: Diagnosis not present

## 2021-05-28 DIAGNOSIS — R5383 Other fatigue: Secondary | ICD-10-CM | POA: Insufficient documentation

## 2021-05-28 DIAGNOSIS — R222 Localized swelling, mass and lump, trunk: Secondary | ICD-10-CM | POA: Diagnosis not present

## 2021-05-28 DIAGNOSIS — Z95828 Presence of other vascular implants and grafts: Secondary | ICD-10-CM

## 2021-05-28 DIAGNOSIS — Z801 Family history of malignant neoplasm of trachea, bronchus and lung: Secondary | ICD-10-CM | POA: Insufficient documentation

## 2021-05-28 MED ORDER — DEXAMETHASONE SODIUM PHOSPHATE 10 MG/ML IJ SOLN: 10.0000 mg | Freq: Once | INTRAMUSCULAR | Status: DC

## 2021-05-28 MED ORDER — HYDROCOD POLST-CPM POLST ER 10-8 MG/5ML PO SUER: 5.0000 mL | Freq: Every evening | ORAL | 0 refills | Status: DC | PRN

## 2021-05-28 MED ORDER — DEXAMETHASONE 4 MG PO TABS: 4.0000 mg | ORAL_TABLET | Freq: Three times a day (TID) | ORAL | 0 refills | Status: DC

## 2021-05-28 MED ORDER — SODIUM CHLORIDE 0.9 % IV SOLN
INTRAVENOUS | Status: DC
  Filled 2021-05-28: qty 250

## 2021-05-28 MED ORDER — DEXAMETHASONE SODIUM PHOSPHATE 10 MG/ML IJ SOLN
10.0000 mg | Freq: Once | INTRAMUSCULAR | Status: AC
  Administered 2021-05-28: 10 mg via INTRAVENOUS
  Filled 2021-05-28: qty 1

## 2021-05-28 MED ORDER — SODIUM CHLORIDE 0.9% FLUSH
10.0000 mL | Freq: Once | INTRAVENOUS | Status: AC
  Administered 2021-05-28: 10 mL via INTRAVENOUS
  Filled 2021-05-28: qty 10

## 2021-05-28 MED ORDER — KETOROLAC TROMETHAMINE 15 MG/ML IJ SOLN
30.0000 mg | Freq: Once | INTRAMUSCULAR | Status: AC
  Administered 2021-05-28: 30 mg via INTRAVENOUS
  Filled 2021-05-28: qty 2

## 2021-05-28 MED ORDER — KETOROLAC TROMETHAMINE 15 MG/ML IJ SOLN: 30.0000 mg | Freq: Once | INTRAMUSCULAR | Status: DC

## 2021-05-28 MED ORDER — HEPARIN SOD (PORK) LOCK FLUSH 100 UNIT/ML IV SOLN
500.0000 [IU] | Freq: Once | INTRAVENOUS | Status: AC
  Administered 2021-05-28: 500 [IU]
  Filled 2021-05-28: qty 5

## 2021-05-28 MED ORDER — FENTANYL 50 MCG/HR TD PT72: 1.0000 | MEDICATED_PATCH | TRANSDERMAL | 0 refills | Status: DC

## 2021-05-28 NOTE — Progress Notes (Signed)
Patient expresses severe pain in back when she coughs. Has taken a hydrocodone this am and did does have a fentanyl patch on that does not help much at all.

## 2021-05-28 NOTE — Patient Instructions (Signed)
Call your pain clinic to discuss pain management.  If your pain gets any worse, go to the ER.

## 2021-05-28 NOTE — Progress Notes (Signed)
[facility] NOTE  Patient Care Team: Loni Muse, MD as PCP - General (Internal Medicine) Stitzenberg, Clint Lipps, MD as Referring Physician (Surgical Oncology) Lequita Asal, MD (Inactive) as Referring Physician (Hematology and Oncology) Tyler Pita, MD as Consulting Physician (Pulmonary Disease)  CHIEF COMPLAINTS/PURPOSE OF CONSULTATION:  Rectal cancer metastatic  #  Oncology History Overview Note  She received 6 cycles of FOLFOX (02/18/2017 - 04/22/2017).               She underwent CT guided microwave ablation of the liver lesion on 06/06/2017.               She received radiation and daily Xeloda (06/18/2017- 08/15/2017).             She underwent APR with descending colostomy and VRAM flap on 10/24/2017.              She underwent a VATS wedge resection of her left lung.             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                         She declined left lower lobe lobectomy.                         She received 1 cycle of FOLFIRI (03/08/2020).                           She declined further chemotherapy or surgery.             She completed SBRT to the left infrahilar nodule on 05/22/2020.             CEA was 40.9 on 06/13/2020 and 9.5 on 10/31/2020.              Chest, abdomen, and pelvis CT on 10/31/2020 was personally reviewed.  Agree with radiology findings.   Rectal cancer ([place])  01/13/2017 Initial Diagnosis   Rectal cancer ([place])   03/08/2020 -  Chemotherapy   The patient had dexamethasone (DECADRON) 4 MG tablet, 8 mg, Oral, Daily, 1 of 1 cycle, Start date: 03/08/2020, End date: 06/28/2020 palonosetron (ALOXI) injection 0.25 mg, 0.25 mg, Intravenous,  Once, 1 of 4 cycles Administration: 0.25 mg (03/08/2020) pegfilgrastim-jmdb (FULPHILA) injection 6 mg, 6 mg, Subcutaneous,  Once, 1 of 4 cycles Administration: 6 mg (03/10/2020) irinotecan (CAMPTOSAR) 360 mg in sodium chloride 0.9 % 500 mL chemo  infusion, 180 mg/m2 = 360 mg, Intravenous,  Once, 1 of 4 cycles Administration: 360 mg (03/08/2020) fluorouracil (ADRUCIL) chemo injection 800 mg, 400 mg/m2 = 800 mg, Intravenous,  Once, 1 of 4 cycles Administration: 800 mg (03/08/2020) fluorouracil (ADRUCIL) 5,000 mg in sodium chloride 0.9 % 150 mL chemo infusion, 2,475 mg/m2 = 4,850 mg, Intravenous, 1 Day/Dose, 1 of 4 cycles Administration: 5,000 mg (03/08/2020) bevacizumab-bvzr (ZIRABEV) 400 mg in sodium chloride 0.9 % 100 mL chemo infusion, 5 mg/kg = 400 mg, Intravenous,  Once, 0 of 3 cycles leucovorin 812 mg in sodium chloride 0.9 % 250 mL infusion, 400 mg/m2 = 812 mg, Intravenous,  Once, 1 of 4 cycles Administration: 812 mg (03/08/2020)   for chemotherapy treatment.        HISTORY OF PRESENTING ILLNESS:  Tamara Hall 65 y.o.  female history of stage IV rectal cancer with  metastasis to liver/lung currently on surveillance is here for follow-up/given worsening left posterior chest wall pain.  Patient noted to have worse left posterior chest wall pain for the last 1 month.  She was evaluated in the emergency room at [facility] that showed approximately 3 to 4 cm mass involving the left posterior chest wall/invasion of the ribs.  Pain significantly worse when coughing taking deep breath.  Patient denies any blood in stools or black or stool.  No nausea or vomiting.   Review of Systems  Constitutional:  Positive for malaise/fatigue. Negative for chills, diaphoresis, fever and weight loss.       Left posterior chest wall pain.  HENT:  Negative for nosebleeds and sore throat.   Eyes:  Negative for double vision.  Respiratory:  Negative for cough, hemoptysis, sputum production, shortness of breath and wheezing.   Cardiovascular:  Negative for chest pain, palpitations, orthopnea and leg swelling.  Gastrointestinal:  Negative for abdominal pain, blood in stool, constipation, diarrhea, heartburn, melena, nausea and vomiting.  Genitourinary:  Negative for  dysuria, frequency and urgency.  Musculoskeletal:  Positive for back pain and joint pain.  Skin: Negative.  Negative for itching and rash.  Neurological:  Negative for dizziness, tingling, focal weakness, weakness and headaches.  Endo/Heme/Allergies:  Does not bruise/bleed easily.  Psychiatric/Behavioral:  Negative for depression. The patient is not nervous/anxious and does not have insomnia.     MEDICAL HISTORY:  Past Medical History:  Diagnosis Date   Cancer ([place])    colon, with mets to liver and left lung   Hypertension    Pneumonia    20+ years ago    SURGICAL HISTORY: Past Surgical History:  Procedure Laterality Date   ABDOMINAL HYSTERECTOMY     BREAST SURGERY     COLON SURGERY  10/2017   [facility]   COLONOSCOPY WITH PROPOFOL N/A 01/14/2017   Procedure: COLONOSCOPY WITH PROPOFOL;  Surgeon: Lucilla Lame, MD;  Location: [facility] ENDOSCOPY;  Service: Endoscopy;  Laterality: N/A;   ELBOW SURGERY Right    Has had 4 surgeries on right elbow.   HAND SURGERY Right 2008   JOINT REPLACEMENT     LIVER BIOPSY  10/2017   lung wedge resection  10/2017   PORTA CATH INSERTION N/A 01/29/2017   Procedure: Glori Luis Cath Insertion;  Surgeon: Algernon Huxley, MD;  Location: [facility];  Service: Cardiovascular;  Laterality: N/A;   TONSILLECTOMY      SOCIAL HISTORY: Social History   Socioeconomic History   Marital status: Divorced    Spouse name: Not on file   Number of children: Not on file   Years of education: Not on file   Highest education level: Not on file  Occupational History   Not on file  Tobacco Use   Smoking status: Every Day    Packs/day: 1.50    Years: 40.00    Pack years: 60.00    Types: Cigarettes   Smokeless tobacco: Never   Tobacco comments:    2 cigs daily-05/10/2021  Vaping Use   Vaping Use: Never used  Substance and Sexual Activity   Alcohol use: No   Drug use: No   Sexual activity: Not on file  Other Topics Concern   Not on file  Social History Narrative    Not on file   Social Determinants of Health   Financial Resource Strain: Not on file  Food Insecurity: Not on file  Transportation Needs: Not on file  Physical Activity: Not on file  Stress: Not on file  Social Connections: Not on file  Intimate Partner Violence: Not on file    FAMILY HISTORY: Family History  Problem Relation Age of Onset   Lung cancer Mother 53   Esophageal cancer Father 63   Brain cancer Maternal Grandmother     ALLERGIES:  has No Known Allergies.  MEDICATIONS:  Current Outpatient Medications  Medication Sig Dispense Refill   albuterol (VENTOLIN HFA) 108 (90 Base) MCG/ACT inhaler Inhale 1-2 puffs into the lungs every 6 (six) hours as needed for wheezing or shortness of breath.      amLODipine (NORVASC) 5 MG tablet Take 5 mg by mouth daily.     baclofen (LIORESAL) 10 MG tablet Take 10 mg by mouth 3 (three) times daily.     Calcium Carbonate (CALCIUM 500 PO) Take 1 tablet by mouth daily.     chlorpheniramine-HYDROcodone (TUSSIONEX) 10-8 MG/5ML SUER Take 5 mLs by mouth at bedtime as needed for cough. 140 mL 0   dexamethasone (DECADRON) 4 MG tablet Take 1 tablet (4 mg total) by mouth 3 (three) times daily. 45 tablet 0   fentaNYL (DURAGESIC) 50 MCG/HR Place 1 patch onto the skin every 3 (three) days. 5 patch 0   folic acid (FOLVITE) 1 MG tablet TAKE 1 TABLET BY MOUTH EVERY DAY 90 tablet 1   gabapentin (NEURONTIN) 300 MG capsule Take 1,200 mg by mouth 3 (three) times daily.      HYDROcodone-acetaminophen (NORCO) 10-325 MG tablet SMARTSIG:1 Tablet(s) By Mouth 4-5 Times Daily     nortriptyline (PAMELOR) 50 MG capsule Take 50 mg by mouth at bedtime.     omeprazole (PRILOSEC) 20 MG capsule TAKE 1 CAPSULE BY MOUTH EVERY DAY 30 capsule 1   Ostomy Supplies (PREMIER DRAINABLE POUCH 64MM) Pouch MISC      potassium chloride SA (KLOR-CON M20) 20 MEQ tablet Take 1 tablet (20 mEq total) by mouth 3 (three) times daily. 90 tablet 2   valsartan (DIOVAN) 80 MG tablet Take 80 mg by  mouth daily.     budesonide (PULMICORT) 0.5 MG/2ML nebulizer solution Take 2 mLs (0.5 mg total) by nebulization 2 (two) times daily. (Patient not taking: Reported on 05/28/2021) 120 mL 11   ipratropium-albuterol (DUONEB) 0.5-2.5 (3) MG/3ML SOLN Take 3 mLs by nebulization every 6 (six) hours as needed. (Patient not taking: Reported on 05/28/2021) 360 mL 11   No current facility-administered medications for this visit.   Facility-Administered Medications Ordered in Other Visits  Medication Dose Route Frequency Provider Last Rate Last Admin   0.9 %  sodium chloride infusion   Intravenous Continuous Cammie Sickle, MD   Stopped at 05/28/21 1220      .  PHYSICAL EXAMINATION: ECOG PERFORMANCE STATUS: 1 - Symptomatic but completely ambulatory  Vitals:   05/28/21 1001  BP: (!) 132/54  Pulse: 97  Resp: 18  Temp: (!) 95 F (35 C)  SpO2: 97%   Filed Weights   05/28/21 1001  Weight: 181 lb 6.4 oz (82.3 kg)    Physical Exam Vitals and nursing note reviewed.  Constitutional:      Comments: Patient uncomfortable because of posterior chest wall pain especially with coughing.  Otherwise no acute distress.  HENT:     Head: Normocephalic and atraumatic.     Mouth/Throat:     Pharynx: Oropharynx is clear.  Eyes:     Extraocular Movements: Extraocular movements intact.     Pupils: Pupils are equal, round, and reactive to light.  Cardiovascular:  Rate and Rhythm: Normal rate and regular rhythm.  Pulmonary:     Comments: Decreased breath sounds bilaterally.  Abdominal:     Palpations: Abdomen is soft.  Musculoskeletal:        General: Normal range of motion.     Cervical back: Normal range of motion.  Skin:    General: Skin is warm.  Neurological:     General: No focal deficit present.     Mental Status: She is alert and oriented to person, place, and time.  Psychiatric:        Behavior: Behavior normal.        Judgment: Judgment normal.     LABORATORY DATA:  I have  reviewed the data as listed Lab Results  Component Value Date   WBC 10.1 02/08/2021   HGB 14.0 02/08/2021   HCT 41.3 02/08/2021   MCV 90.6 02/08/2021   PLT 312 02/08/2021   Recent Labs    05/29/20 0929 06/13/20 1103 07/04/20 1211 08/07/20 1354 10/31/20 1154 11/16/20 1256 12/14/20 1311 02/08/21 1445  NA 136 137 138   < > 136 137 137 133*  K 3.3* 3.7 3.5   < > 3.4* 4.0 3.6 3.9  CL 99 99 99   < > 97* 98 100 98  CO2 29 29 28    < > 27 26 25 28   GLUCOSE 100* 125* 125*   < > 113* 135* 101* 118*  BUN 10 10 10    < > 7* 13 12 12   CREATININE 0.70 0.72 0.79   < > 0.72 0.75 0.93 0.77  CALCIUM 9.0 8.8* 8.8*   < > 8.7* 9.3 9.1 9.2  GFRNONAA >60 >60 >60   < > >60 >60 >60 >60  GFRAA >60 >60 >60  --   --   --   --   --   PROT 7.5  --   --    < > 7.1  --  7.3 7.3  ALBUMIN 4.1  --   --    < > 3.7  --  3.9 3.8  AST 14*  --   --    < > 14*  --  20 16  ALT 10  --   --    < > 11  --  14 11  ALKPHOS 89  --   --    < > 77  --  79 89  BILITOT 0.4  --   --    < > 0.4  --  0.6 0.2*   < > = values in this interval not displayed.    RADIOGRAPHIC STUDIES: I have personally reviewed the radiological images as listed and agreed with the findings in the report. No results found.  ASSESSMENT & PLAN:   Rectal cancer ([place]) Stage IV rectal carcinoma: with lung/liver metastases [s/p wedge resection; declined lobectomy s/p SBRT-Lung; Liver-ablation]- most recently on surveillance.    # May 21, 2021-[facility] emergency room-new soft tissue lesion noted in the posterior left lower lobe measuring 2.4 cm invasion of the chest wall/bone erosion.  Likely cause of patient's significant pain; along with left lower lobe atelectasis  #I reviewed the concerning findings with the patient in detail.  Recommend a PET scan for further evaluation.  Discussed options include-repeat radiation; systemic chemotherapy; surgery unlikely.  #Pain control: Poorly controlled- [Dr.Chigdy; [facility]-pain clinic]-hydrocodone 10 every 5-6 hours  [as per patient; for her back pain].  Obviously with a new lesion noted in the left posterior chest wall-would recommend increasing narcotic  pain prescription.  I left a message for the patient's pain physician to discuss this further.  We will increase the fentanyl patch from 12.5 to 50 mcg.  New prescription sent.  We will have the patient follow-up with palliative care at next visit.  For now ordered Toradol and also dexamethasone 10 mg IV.  Also sent a prescription for dexamethasone on outpatient basis.   # DISPOSITION: # PET scan ASAP # tordaol/Dex today # referral to Dr.Chrystal [ 1-2 days after the PET scan] # follow up- MD 1-2 days after the PET scan- Dr.B  # 40 minutes face-to-face with the patient discussing the above plan of care; more than 50% of time spent on prognosis/ natural history; counseling and coordination.  All questions were answered. The patient knows to call the clinic with any problems, questions or concerns.   Cammie Sickle, MD 05/28/2021 1:22 PM

## 2021-05-28 NOTE — Assessment & Plan Note (Addendum)
Stage IV rectal carcinoma: with lung/liver metastases [s/p wedge resection; declined lobectomy s/p SBRT-Lung; Liver-ablation]- most recently on surveillance.    # May 21, 2021-[facility] emergency room-new soft tissue lesion noted in the posterior left lower lobe measuring 2.4 cm invasion of the chest wall/bone erosion.  Likely cause of patient's significant pain; along with left lower lobe atelectasis  #I reviewed the concerning findings with the patient in detail.  Recommend a PET scan for further evaluation.  Discussed options include-repeat radiation; systemic chemotherapy; surgery unlikely.  #Pain control: Poorly controlled- [Dr.Chigdy; [facility]-pain clinic]-hydrocodone 10 every 5-6 hours [as per patient; for her back pain].  Obviously with a new lesion noted in the left posterior chest wall-would recommend increasing narcotic pain prescription.  I left a message for the patient's pain physician to discuss this further.  We will increase the fentanyl patch from 12.5 to 50 mcg.  New prescription sent.  We will have the patient follow-up with palliative care at next visit.  For now ordered Toradol and also dexamethasone 10 mg IV.  Also sent a prescription for dexamethasone on outpatient basis.   # DISPOSITION: # PET scan ASAP # tordaol/Dex today # referral to Dr.Chrystal [ 1-2 days after the PET scan] # follow up- MD 1-2 days after the PET scan- Dr.B  # 40 minutes face-to-face with the patient discussing the above plan of care; more than 50% of time spent on prognosis/ natural history; counseling and coordination.

## 2021-05-28 NOTE — Telephone Encounter (Signed)
Incoming fax from patient's pharmacy to verify patient's dx codes for the Tussionex. They also wanted Dr. B to be aware that patient just received #165 tablets of hydrocodone/apap 10/325  yesterday and + fentanyl patches 12.5 mcg.   I explained to the pharmacist that Dr. Jacinto Reap is aware that provider. We have left msg for the pain clinic to return Dr. Sharmaine Base call. Pt has metastatic rectal cancer and needs pain control. Dr. B wanted to increase the dose to 50 mcg patches if pain mgmt approves. We sent the Tussinex to pt's pharmacy to help with cough.  Again, reiterated that pain mgmt must approve the scripts prior to the dispensing the medication.

## 2021-05-28 NOTE — Telephone Encounter (Signed)
Spoke to patient, who stated that [facility] contacted her and advised her that insurance denied claim stating that it was too early to fill Rx. [facility] advised [facility], as [facility] stated that [facility] advised [facility] that Rx had been placed back on shelf due to cost. Patient then called back to [facility] and was told that insurance again denied Rx. I advised patient to contact insurance company.  Patient will call back if anything further is needed on our end.

## 2021-05-28 NOTE — Progress Notes (Signed)
Pt discharge with instructions to go to the ER if pain intensifies.

## 2021-05-30 ENCOUNTER — Encounter: Payer: Self-pay | Admitting: Hematology and Oncology

## 2021-05-30 ENCOUNTER — Ambulatory Visit: Payer: Medicare HMO | Admitting: Radiation Oncology

## 2021-05-30 NOTE — Addendum Note (Signed)
Addended by: Delice Bison E on: 05/30/2021 10:15 AM   Modules accepted: Orders

## 2021-05-31 ENCOUNTER — Other Ambulatory Visit: Payer: Medicare HMO

## 2021-06-04 ENCOUNTER — Ambulatory Visit: Payer: Medicare HMO | Admitting: Radiation Oncology

## 2021-06-04 ENCOUNTER — Other Ambulatory Visit: Payer: Self-pay | Admitting: *Deleted

## 2021-06-04 ENCOUNTER — Ambulatory Visit: Payer: Medicare HMO | Admitting: Internal Medicine

## 2021-06-04 DIAGNOSIS — E538 Deficiency of other specified B group vitamins: Secondary | ICD-10-CM

## 2021-06-04 NOTE — Telephone Encounter (Signed)
Pt requesting RF on folic acid (previously prescribed by Dr. Mike Gip)

## 2021-06-05 ENCOUNTER — Encounter: Payer: Self-pay | Admitting: Hematology and Oncology

## 2021-06-05 MED ORDER — FOLIC ACID 1 MG PO TABS: 1.0000 mg | ORAL_TABLET | Freq: Every day | ORAL | 1 refills | Status: DC

## 2021-06-06 ENCOUNTER — Emergency Department: Payer: Medicare HMO

## 2021-06-06 ENCOUNTER — Inpatient Hospital Stay
Admission: EM | Admit: 2021-06-06 | Discharge: 2021-06-08 | DRG: 871 | Disposition: A | Discharge status: Home / Self Care | Payer: Medicare HMO | Attending: Internal Medicine | Admitting: Internal Medicine

## 2021-06-06 ENCOUNTER — Other Ambulatory Visit: Payer: Self-pay

## 2021-06-06 DIAGNOSIS — B370 Candidal stomatitis: Secondary | ICD-10-CM | POA: Diagnosis present

## 2021-06-06 DIAGNOSIS — J45901 Unspecified asthma with (acute) exacerbation: Secondary | ICD-10-CM | POA: Diagnosis present

## 2021-06-06 DIAGNOSIS — Z20822 Contact with and (suspected) exposure to covid-19: Secondary | ICD-10-CM | POA: Diagnosis present

## 2021-06-06 DIAGNOSIS — J029 Acute pharyngitis, unspecified: Secondary | ICD-10-CM

## 2021-06-06 DIAGNOSIS — Z800 Family history of malignant neoplasm of digestive organs: Secondary | ICD-10-CM | POA: Diagnosis not present

## 2021-06-06 DIAGNOSIS — Z808 Family history of malignant neoplasm of other organs or systems: Secondary | ICD-10-CM | POA: Diagnosis not present

## 2021-06-06 DIAGNOSIS — K219 Gastro-esophageal reflux disease without esophagitis: Secondary | ICD-10-CM | POA: Diagnosis present

## 2021-06-06 DIAGNOSIS — R0602 Shortness of breath: Secondary | ICD-10-CM | POA: Diagnosis present

## 2021-06-06 DIAGNOSIS — J9601 Acute respiratory failure with hypoxia: Secondary | ICD-10-CM | POA: Diagnosis present

## 2021-06-06 DIAGNOSIS — Z801 Family history of malignant neoplasm of trachea, bronchus and lung: Secondary | ICD-10-CM | POA: Diagnosis not present

## 2021-06-06 DIAGNOSIS — A419 Sepsis, unspecified organism: Principal | ICD-10-CM

## 2021-06-06 DIAGNOSIS — C20 Malignant neoplasm of rectum: Secondary | ICD-10-CM | POA: Diagnosis present

## 2021-06-06 DIAGNOSIS — Z515 Encounter for palliative care: Secondary | ICD-10-CM

## 2021-06-06 DIAGNOSIS — F17210 Nicotine dependence, cigarettes, uncomplicated: Secondary | ICD-10-CM | POA: Diagnosis present

## 2021-06-06 DIAGNOSIS — C7802 Secondary malignant neoplasm of left lung: Secondary | ICD-10-CM | POA: Diagnosis present

## 2021-06-06 DIAGNOSIS — J449 Chronic obstructive pulmonary disease, unspecified: Secondary | ICD-10-CM | POA: Diagnosis present

## 2021-06-06 DIAGNOSIS — J189 Pneumonia, unspecified organism: Secondary | ICD-10-CM

## 2021-06-06 DIAGNOSIS — R6520 Severe sepsis without septic shock: Secondary | ICD-10-CM

## 2021-06-06 DIAGNOSIS — C7951 Secondary malignant neoplasm of bone: Secondary | ICD-10-CM | POA: Diagnosis present

## 2021-06-06 DIAGNOSIS — I10 Essential (primary) hypertension: Secondary | ICD-10-CM | POA: Diagnosis present

## 2021-06-06 DIAGNOSIS — E538 Deficiency of other specified B group vitamins: Secondary | ICD-10-CM | POA: Diagnosis present

## 2021-06-06 DIAGNOSIS — Z933 Colostomy status: Secondary | ICD-10-CM

## 2021-06-06 DIAGNOSIS — Z7952 Long term (current) use of systemic steroids: Secondary | ICD-10-CM

## 2021-06-06 DIAGNOSIS — Z85048 Personal history of other malignant neoplasm of rectum, rectosigmoid junction, and anus: Secondary | ICD-10-CM

## 2021-06-06 DIAGNOSIS — Z902 Acquired absence of lung [part of]: Secondary | ICD-10-CM | POA: Diagnosis not present

## 2021-06-06 DIAGNOSIS — Z79899 Other long term (current) drug therapy: Secondary | ICD-10-CM

## 2021-06-06 DIAGNOSIS — Z90710 Acquired absence of both cervix and uterus: Secondary | ICD-10-CM

## 2021-06-06 DIAGNOSIS — D8481 Immunodeficiency due to conditions classified elsewhere: Secondary | ICD-10-CM | POA: Diagnosis present

## 2021-06-06 DIAGNOSIS — C787 Secondary malignant neoplasm of liver and intrahepatic bile duct: Secondary | ICD-10-CM | POA: Diagnosis present

## 2021-06-06 LAB — URINALYSIS, COMPLETE (UACMP) WITH MICROSCOPIC
Bilirubin Urine: NEGATIVE
Glucose, UA: NEGATIVE mg/dL
Ketones, ur: NEGATIVE mg/dL
Nitrite: NEGATIVE
Protein, ur: NEGATIVE mg/dL
Specific Gravity, Urine: 1.009 (ref 1.005–1.030)
pH: 6 (ref 5.0–8.0)

## 2021-06-06 LAB — COMPREHENSIVE METABOLIC PANEL
ALT: 14 U/L (ref 0–44)
AST: 19 U/L (ref 15–41)
Albumin: 3 g/dL — ABNORMAL LOW (ref 3.5–5.0)
Alkaline Phosphatase: 92 U/L (ref 38–126)
Anion gap: 8 (ref 5–15)
BUN: 18 mg/dL (ref 8–23)
CO2: 30 mmol/L (ref 22–32)
Calcium: 8.5 mg/dL — ABNORMAL LOW (ref 8.9–10.3)
Chloride: 97 mmol/L — ABNORMAL LOW (ref 98–111)
Creatinine, Ser: 0.64 mg/dL (ref 0.44–1.00)
GFR, Estimated: >60 mL/min (ref >60)
Glucose, Bld: 123 mg/dL — ABNORMAL HIGH (ref 70–99)
Potassium: 3.9 mmol/L (ref 3.5–5.1)
Sodium: 135 mmol/L (ref 135–145)
Total Bilirubin: 0.6 mg/dL (ref 0.3–1.2)
Total Protein: 6.5 g/dL (ref 6.5–8.1)

## 2021-06-06 LAB — CBC WITH DIFFERENTIAL/PLATELET
Abs Immature Granulocytes: 0.93 10*3/uL — ABNORMAL HIGH (ref 0.00–0.07)
Basophils Absolute: 0 10*3/uL (ref 0.0–0.1)
Basophils Relative: 0 %
Eosinophils Absolute: 0 10*3/uL (ref 0.0–0.5)
Eosinophils Relative: 0 %
HCT: 40.4 % (ref 36.0–46.0)
Hemoglobin: 13.8 g/dL (ref 12.0–15.0)
Immature Granulocytes: 5 %
Lymphocytes Relative: 6 %
Lymphs Abs: 1.2 10*3/uL (ref 0.7–4.0)
MCH: 31.4 pg (ref 26.0–34.0)
MCHC: 34.2 g/dL (ref 30.0–36.0)
MCV: 92 fL (ref 80.0–100.0)
Monocytes Absolute: 0.8 10*3/uL (ref 0.1–1.0)
Monocytes Relative: 4 %
Neutro Abs: 16 10*3/uL — ABNORMAL HIGH (ref 1.7–7.7)
Neutrophils Relative %: 85 %
Platelets: 273 10*3/uL (ref 150–400)
RBC: 4.39 MIL/uL (ref 3.87–5.11)
RDW: 14 % (ref 11.5–15.5)
WBC: 18.9 10*3/uL — ABNORMAL HIGH (ref 4.0–10.5)
nRBC: 0.2 % (ref 0.0–0.2)

## 2021-06-06 LAB — RESP PANEL BY RT-PCR (FLU A&B, COVID) ARPGX2
Influenza A by PCR: NEGATIVE
Influenza B by PCR: NEGATIVE
SARS Coronavirus 2 by RT PCR: NEGATIVE

## 2021-06-06 LAB — GROUP A STREP BY PCR: Group A Strep by PCR: NOT DETECTED

## 2021-06-06 LAB — LACTIC ACID, PLASMA: Lactic Acid, Venous: 1.5 mmol/L (ref 0.5–1.9)

## 2021-06-06 MED ORDER — SODIUM CHLORIDE 0.9 % IV SOLN
2.0000 g | INTRAVENOUS | Status: DC
  Administered 2021-06-07 – 2021-06-08 (×2): 2 g via INTRAVENOUS
  Filled 2021-06-06: qty 20
  Filled 2021-06-06: qty 2

## 2021-06-06 MED ORDER — VANCOMYCIN HCL IN DEXTROSE 1-5 GM/200ML-% IV SOLN
1000.0000 mg | Freq: Once | INTRAVENOUS | Status: AC
  Administered 2021-06-06: 1000 mg via INTRAVENOUS
  Filled 2021-06-06: qty 200

## 2021-06-06 MED ORDER — BACLOFEN 10 MG PO TABS
10.0000 mg | ORAL_TABLET | Freq: Three times a day (TID) | ORAL | Status: DC
  Administered 2021-06-06 – 2021-06-08 (×5): 10 mg via ORAL
  Filled 2021-06-06 (×8): qty 1

## 2021-06-06 MED ORDER — ENOXAPARIN SODIUM 40 MG/0.4ML IJ SOSY
40.0000 mg | PREFILLED_SYRINGE | Freq: Every day | INTRAMUSCULAR | Status: DC
  Administered 2021-06-07 – 2021-06-08 (×2): 40 mg via SUBCUTANEOUS
  Filled 2021-06-06 (×2): qty 0.4

## 2021-06-06 MED ORDER — GABAPENTIN 400 MG PO CAPS
1200.0000 mg | ORAL_CAPSULE | Freq: Three times a day (TID) | ORAL | Status: DC
  Administered 2021-06-06 – 2021-06-08 (×5): 1200 mg via ORAL
  Filled 2021-06-06: qty 3
  Filled 2021-06-06: qty 4
  Filled 2021-06-06 (×3): qty 3

## 2021-06-06 MED ORDER — PREDNISONE 20 MG PO TABS
40.0000 mg | ORAL_TABLET | Freq: Every day | ORAL | Status: DC
  Administered 2021-06-07 – 2021-06-08 (×2): 40 mg via ORAL
  Filled 2021-06-06 (×2): qty 2

## 2021-06-06 MED ORDER — SODIUM CHLORIDE 0.9 % IV SOLN
2.0000 g | Freq: Once | INTRAVENOUS | Status: AC
  Administered 2021-06-06: 2 g via INTRAVENOUS
  Filled 2021-06-06: qty 2

## 2021-06-06 MED ORDER — SODIUM CHLORIDE 0.9 % IV SOLN: INTRAVENOUS | Status: DC

## 2021-06-06 MED ORDER — IPRATROPIUM-ALBUTEROL 0.5-2.5 (3) MG/3ML IN SOLN
3.0000 mL | Freq: Once | RESPIRATORY_TRACT | Status: AC
  Administered 2021-06-06: 3 mL via RESPIRATORY_TRACT
  Filled 2021-06-06: qty 6

## 2021-06-06 MED ORDER — HYDROCODONE-ACETAMINOPHEN 10-325 MG PO TABS
1.0000 | ORAL_TABLET | Freq: Four times a day (QID) | ORAL | Status: DC | PRN
Start: 2021-06-06 — End: 2021-06-07
  Administered 2021-06-06 – 2021-06-07 (×2): 1 via ORAL
  Filled 2021-06-06 (×2): qty 1

## 2021-06-06 MED ORDER — METHYLPREDNISOLONE SODIUM SUCC 125 MG IJ SOLR
125.0000 mg | Freq: Four times a day (QID) | INTRAMUSCULAR | Status: AC
  Administered 2021-06-06: 125 mg via INTRAVENOUS
  Filled 2021-06-06: qty 2

## 2021-06-06 MED ORDER — NYSTATIN 100000 UNIT/ML MT SUSP
5.0000 mL | Freq: Four times a day (QID) | OROMUCOSAL | Status: DC
  Administered 2021-06-06 – 2021-06-08 (×7): 500000 [IU] via ORAL
  Filled 2021-06-06 (×9): qty 5

## 2021-06-06 MED ORDER — IPRATROPIUM-ALBUTEROL 0.5-2.5 (3) MG/3ML IN SOLN
3.0000 mL | Freq: Once | RESPIRATORY_TRACT | Status: AC
  Administered 2021-06-06: 3 mL via RESPIRATORY_TRACT
  Filled 2021-06-06: qty 3

## 2021-06-06 MED ORDER — NORTRIPTYLINE HCL 25 MG PO CAPS
50.0000 mg | ORAL_CAPSULE | Freq: Every day | ORAL | Status: DC
  Administered 2021-06-06 – 2021-06-07 (×2): 50 mg via ORAL
  Filled 2021-06-06 (×3): qty 2

## 2021-06-06 MED ORDER — METHYLPREDNISOLONE SODIUM SUCC 125 MG IJ SOLR
125.0000 mg | INTRAMUSCULAR | Status: AC
  Administered 2021-06-06: 125 mg via INTRAVENOUS
  Filled 2021-06-06: qty 2

## 2021-06-06 MED ORDER — PANTOPRAZOLE SODIUM 40 MG PO TBEC
40.0000 mg | DELAYED_RELEASE_TABLET | Freq: Every day | ORAL | Status: DC
  Administered 2021-06-07 – 2021-06-08 (×2): 40 mg via ORAL
  Filled 2021-06-06 (×2): qty 1

## 2021-06-06 MED ORDER — IPRATROPIUM-ALBUTEROL 0.5-2.5 (3) MG/3ML IN SOLN
3.0000 mL | Freq: Four times a day (QID) | RESPIRATORY_TRACT | Status: DC
  Administered 2021-06-06 – 2021-06-08 (×7): 3 mL via RESPIRATORY_TRACT
  Filled 2021-06-06 (×5): qty 3
  Filled 2021-06-06: qty 9
  Filled 2021-06-06 (×2): qty 3

## 2021-06-06 MED ORDER — FOLIC ACID 1 MG PO TABS
1.0000 mg | ORAL_TABLET | Freq: Every day | ORAL | Status: DC
  Administered 2021-06-07 – 2021-06-08 (×2): 1 mg via ORAL
  Filled 2021-06-06 (×2): qty 1

## 2021-06-06 MED ORDER — ALBUTEROL SULFATE HFA 108 (90 BASE) MCG/ACT IN AERS: 1.0000 | INHALATION_SPRAY | Freq: Four times a day (QID) | RESPIRATORY_TRACT | Status: DC | PRN

## 2021-06-06 MED ORDER — AZITHROMYCIN 500 MG IV SOLR
500.0000 mg | INTRAVENOUS | Status: DC
  Administered 2021-06-06 – 2021-06-07 (×2): 500 mg via INTRAVENOUS
  Filled 2021-06-06 (×3): qty 500

## 2021-06-06 MED ORDER — SODIUM CHLORIDE 0.9 % IV BOLUS
1000.0000 mL | Freq: Once | INTRAVENOUS | Status: AC
  Administered 2021-06-06: 1000 mL via INTRAVENOUS

## 2021-06-06 MED ORDER — IOHEXOL 350 MG/ML SOLN
75.0000 mL | Freq: Once | INTRAVENOUS | Status: AC | PRN
  Administered 2021-06-06: 75 mL via INTRAVENOUS

## 2021-06-06 MED ORDER — ALBUTEROL SULFATE (2.5 MG/3ML) 0.083% IN NEBU: 2.5000 mg | INHALATION_SOLUTION | Freq: Four times a day (QID) | RESPIRATORY_TRACT | Status: DC | PRN

## 2021-06-06 MED ORDER — FENTANYL 50 MCG/HR TD PT72: 1.0000 | MEDICATED_PATCH | TRANSDERMAL | Status: DC

## 2021-06-06 NOTE — Consult Note (Signed)
CODE SEPSIS - PHARMACY COMMUNICATION  **Broad Spectrum Antibiotics should be administered within 1 hour of Sepsis diagnosis**  Time Code Sepsis Called/Page Received: 13:11  Antibiotics Ordered: cefepime and vancomycin  Time of 1st antibiotic administration: 13:25  Additional action taken by pharmacy: none     Oswald Hillock ,PharmD Clinical Pharmacist  06/06/2021  1:39 PM

## 2021-06-06 NOTE — Sepsis Progress Note (Signed)
Notified bedside nurse of need to draw blood cultures and repeat lactic acid.

## 2021-06-06 NOTE — Sepsis Progress Note (Signed)
Elink tracking the Code Sepsis.

## 2021-06-06 NOTE — ED Triage Notes (Signed)
See first nurse note, pt reports shob and generalized pain. Reports colon cancer and "something found on lungs, unsure if cancer" Reports using husbands O2 Has been using fentanyl patches for pain associated with cancer in abd Speaking in complete sentences. SpO2 86% on 2 L [place], increased to 4L Last chemo 4 years ago

## 2021-06-06 NOTE — ED Notes (Signed)
This RN attempted to collect a MRSA nasal swab from patient, patient declined at this time

## 2021-06-06 NOTE — Progress Notes (Addendum)
  Pt has arrived to unit. Vitals stable. Pt has colostomy bag that is full, offered to empty bag pt declined and stated that she takes care of it herself and wants to wait until the morning to change bag. Nurse tech also offered to empty bag, pt declined. Pt has no concerns at this time. Call bell within reach.    06/06/21 2306  Vitals  Temp 98.2 F (36.8 C)  BP (!) 156/64  MAP (mmHg) 89  BP Location Right Arm  BP Method Automatic  Patient Position (if appropriate) Lying  Pulse Rate 92  Resp 20  MEWS COLOR  MEWS Score Color Green  Oxygen Therapy  SpO2 94 %  O2 Device Nasal Cannula  O2 Flow Rate (L/min) 4 L/min

## 2021-06-06 NOTE — ED Triage Notes (Signed)
Pt comes ems from home with SOB. Not usually on oxygen but has been using husband's oxygen. Wheezing present. 2 duonebs brought sats from 88 to 96%. 2L Home at this time. Lung cancer pt with [facility]. Aox4. Appears anxious. Yesterday was first day of fentanyl patch. 132/73. HR 92.

## 2021-06-06 NOTE — H&P (Addendum)
History and Physical:    Tamara Hall   WCB:[id number] DOB: January 24, 1956 DOA: 06/06/2021  Referring MD/provider: Delman Kitten, MD PCP: Loni Muse, MD   Patient coming from: Home  Chief Complaint: Shortness of breath  History of Present Illness:   Tamara Hall is a 65 y.o. 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  She says she developed shortness of breath 2 days ago.  This has progressively worsened.  It is worse with exertion and associated with wheezing.  She was so short of breath yesterday that she used her husband's home oxygen.  She used her albuterol inhaler at home without any.  She said she usually has a cough because of her lung cancer.  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  She recently started dexamethasone for pain control about a week ago.  She also started fentanyl patch a day prior to admission.  No fever, chills, vomiting, diarrhea, abdominal pain.    ED Course:  The patient was tachypneic, tachycardic, afebrile and hypoxic in the emergency room.  Oxygen saturation was 87% on room air.  Reportedly, her oxygen saturation dropped into the 70s on 3 L/min oxygen while ambulating in the emergency room (according to ED physician, Dr Jacqualine Code).   ROS:   ROS all other systems reviewed were negative  Past Medical History:   Past Medical History:  Diagnosis Date   Cancer ([place])    colon, with mets to liver and left lung   Cough    Hypertension    Pneumonia    20+ years ago    Past Surgical History:   Past Surgical History:  Procedure Laterality Date   ABDOMINAL HYSTERECTOMY     BREAST SURGERY     COLON SURGERY  10/2017   [facility]    COLONOSCOPY WITH PROPOFOL N/A 01/14/2017   Procedure: COLONOSCOPY WITH PROPOFOL;  Surgeon: Lucilla Lame, MD;  Location: [facility] ENDOSCOPY;  Service: Endoscopy;  Laterality: N/A;   ELBOW SURGERY Right    Has had 4 surgeries on right elbow.   HAND SURGERY Right 2008   JOINT REPLACEMENT     LIVER BIOPSY  10/2017   lung wedge resection  10/2017   PORTA CATH INSERTION N/A 01/29/2017   Procedure: Glori Luis Cath Insertion;  Surgeon: Algernon Huxley, MD;  Location: [place] [facility];  Service: Cardiovascular;  Laterality: N/A;   TONSILLECTOMY      Social History:   Social History   Socioeconomic History   Marital status: Divorced    Spouse name: Not on file   Number of children: Not on file   Years of education: Not on file   Highest education level: Not on file  Occupational History   Not on file  Tobacco Use   Smoking status: Every Day    Packs/day: 1.50    Years: 40.00    Pack years: 60.00    Types: Cigarettes   Smokeless tobacco: Never   Tobacco comments:    2 cigs daily-05/10/2021  Vaping Use   Vaping Use: Never used  Substance and Sexual Activity   Alcohol use: No   Drug use: No   Sexual activity: Not on file  Other Topics  Concern   Not on file  Social History Narrative   Not on file   Social Determinants of Health   Financial Resource Strain: Not on file  Food Insecurity: Not on file  Transportation Needs: Not on file  Physical Activity: Not on file  Stress: Not on file  Social Connections: Not on file  Intimate Partner Violence: Not on file    Allergies   Patient has no known allergies.  Family history:   Family History  Problem Relation Age of Onset   Lung cancer Mother 30   Esophageal cancer Father 82   Brain cancer Maternal Grandmother     Current Medications:   Prior to Admission medications   Medication Sig Start Date End Date Taking? Authorizing Provider  albuterol (VENTOLIN HFA) 108 (90 Base) MCG/ACT inhaler Inhale 1-2 puffs into the lungs every 6  (six) hours as needed for wheezing or shortness of breath.  01/22/19  Yes [provider]  amLODipine (NORVASC) 5 MG tablet Take 5 mg by mouth daily. 08/02/19  Yes [provider]  baclofen (LIORESAL) 10 MG tablet Take 10 mg by mouth 3 (three) times daily.   Yes [provider]  Calcium Carbonate (CALCIUM 500 PO) Take 1 tablet by mouth daily.   Yes [provider]  dexamethasone (DECADRON) 4 MG tablet Take 1 tablet (4 mg total) by mouth 3 (three) times daily. 05/28/21  Yes Cammie Sickle, MD  fentaNYL (DURAGESIC) 50 MCG/HR Place 1 patch onto the skin every 3 (three) days. 05/28/21  Yes Cammie Sickle, MD  folic acid (FOLVITE) 1 MG tablet Take 1 tablet (1 mg total) by mouth daily. 06/05/21  Yes Cammie Sickle, MD  gabapentin (NEURONTIN) 300 MG capsule Take 1,200 mg by mouth 3 (three) times daily.  05/14/19 08/31/27 Yes [provider]  HYDROcodone-acetaminophen ([place]) 10-325 MG tablet SMARTSIG:1 Tablet(s) By Mouth 4-5 Times Daily 08/28/20  Yes [provider]  nortriptyline (PAMELOR) 50 MG capsule Take 50 mg by mouth at bedtime. 07/03/20  Yes [provider]  omeprazole (PRILOSEC) 20 MG capsule TAKE 1 CAPSULE BY MOUTH EVERY DAY 05/01/21  Yes Sindy Guadeloupe, MD  potassium chloride SA (KLOR-CON M20) 20 MEQ tablet Take 1 tablet (20 mEq total) by mouth 3 (three) times daily. 04/20/21  Yes Verlon Au, NP  valsartan (DIOVAN) 80 MG tablet Take 80 mg by mouth daily. 05/11/21  Yes [provider]  budesonide (PULMICORT) 0.5 MG/2ML nebulizer solution Take 2 mLs (0.5 mg total) by nebulization 2 (two) times daily. Patient not taking: No sig reported 05/16/21   Tyler Pita, MD  chlorpheniramine-HYDROcodone (TUSSIONEX) 10-8 MG/5ML SUER Take 5 mLs by mouth at bedtime as needed for cough. 05/28/21   Cammie Sickle, MD  ipratropium-albuterol (DUONEB) 0.5-2.5 (3) MG/3ML SOLN Take 3 mLs by nebulization every 6 (six) hours  as needed. Patient not taking: No sig reported 05/10/21   Tyler Pita, MD  Ostomy Supplies (PREMIER DRAINABLE [facility] [phone]) Pouch MISC  12/20/19   [provider]    Physical Exam:   Vitals:   06/06/21 1300 06/06/21 1315 06/06/21 1330 06/06/21 1530  BP:    (!) 131/57  Pulse: 97 95 94 95  Resp: (!) 21 19 (!) 21 (!) 22  Temp:      TempSrc:      SpO2: 95% 95% 97% 95%  Weight:      Height:         Physical Exam: Blood pressure Marland Kitchen)  131/57, pulse 95, temperature 98.7 F (37.1 C), temperature source Oral, resp. rate (!) 22, height 5\' 7"  (1.702 m), weight 83 kg, SpO2 95 %. Gen: No acute distress. Head: Normocephalic, atraumatic. Eyes: Pupils equal, round and reactive to light. Extraocular movements intact.  Sclerae nonicteric.  Mouth: White patches on the tongue and oropharynx suggestive of oral thrush.  Dentures in place Neck: Supple, no thyromegaly, no lymphadenopathy, no jugular venous distention. Chest: Decreased air entry bilaterally.  Diffuse bilateral expiratory wheezing.  No rales heard CV: Heart sounds are regular with an S1, S2. No murmurs, rubs or gallops.  Abdomen: Soft, nontender, nondistended with normal active bowel sounds. No palpable masses.  + Colostomy with brownish feces Extremities: Extremities are without clubbing, or cyanosis. No edema. Pedal pulses 2+.  Skin: Warm and dry. No rashes, lesions or wounds Neuro: Alert and oriented times 3; grossly nonfocal.  Psych: Insight is good and judgment is appropriate. Mood and affect normal.   Data Review:    Labs: Basic Metabolic Panel: Recent Labs  Lab 06/06/21 1113  NA 135  K 3.9  CL 97*  CO2 30  GLUCOSE 123*  BUN 18  CREATININE 0.64  CALCIUM 8.5*   Liver Function Tests: Recent Labs  Lab 06/06/21 1113  AST 19  ALT 14  ALKPHOS 92  BILITOT 0.6  PROT 6.5  ALBUMIN 3.0*   No results for input(s): LIPASE, AMYLASE in the last 168 hours. No results for input(s): AMMONIA in the last 168  hours. CBC: Recent Labs  Lab 06/06/21 1113  WBC 18.9*  NEUTROABS 16.0*  HGB 13.8  HCT 40.4  MCV 92.0  PLT 273   Cardiac Enzymes: No results for input(s): CKTOTAL, CKMB, CKMBINDEX, TROPONINI in the last 168 hours.  BNP (last 3 results) No results for input(s): PROBNP in the last 8760 hours. CBG: No results for input(s): GLUCAP in the last 168 hours.  Urinalysis    Component Value Date/Time   COLORURINE YELLOW (A) 06/06/2021 1315   APPEARANCEUR HAZY (A) 06/06/2021 1315   LABSPEC 1.009 06/06/2021 1315   PHURINE 6.0 06/06/2021 1315   GLUCOSEU NEGATIVE 06/06/2021 1315   HGBUR SMALL (A) 06/06/2021 1315   BILIRUBINUR NEGATIVE 06/06/2021 1315   KETONESUR NEGATIVE 06/06/2021 1315   PROTEINUR NEGATIVE 06/06/2021 1315   NITRITE NEGATIVE 06/06/2021 1315   LEUKOCYTESUR LARGE (A) 06/06/2021 1315      Radiographic Studies: DG Chest 2 View  Result Date: 06/06/2021 CLINICAL DATA:  Shortness of breath.  History of lung cancer EXAM: CHEST - 2 VIEW COMPARISON:  08/24/2020, 10/31/2020 FINDINGS: Stable positioning of a right-sided chest port. Heart size within normal limits. Aortic atherosclerosis. Diffuse interstitial prominence bilaterally. Patchy opacities within the right mid to lower lung. Additional more focal opacity in the periphery of the left mid lung. No pleural effusion. No pneumothorax. IMPRESSION: Patchy opacities within the right mid to lower lung and periphery of the left mid lung, as described. Findings may reflect multifocal infection or edema. Progression of known malignancy is not excluded. Electronically Signed   By: Davina Poke D.O.   On: 06/06/2021 12:39   CT Angio Chest PE W and/or Wo Contrast  Result Date: 06/06/2021 CLINICAL DATA:  Shortness of breath and generalized chest pain EXAM: CT ANGIOGRAPHY CHEST WITH CONTRAST TECHNIQUE: Multidetector CT imaging of the chest was performed using the standard protocol during bolus administration of intravenous contrast.  Multiplanar CT image reconstructions and MIPs were obtained to evaluate the vascular anatomy. CONTRAST:  1mL OMNIPAQUE IOHEXOL 350  MG/ML SOLN COMPARISON:  Chest x-ray from earlier in the same day, CT from 10/31/2020 FINDINGS: Cardiovascular: Atherosclerotic calcifications are noted without aneurysmal dilatation or dissection. No cardiac enlargement is noted. Mild coronary calcifications are seen. The pulmonary artery shows a normal branching pattern without discrete filling defect to suggest pulmonary embolism. The lower lobe branches are somewhat limited in evaluation to patient motion artifact. Right chest wall port is noted. Mediastinum/Nodes: Thoracic inlet is within normal limits. Multiple hilar and mediastinal calcified lymph nodes are noted consistent with prior granulomatous disease. No sizable adenopathy is seen. The esophagus as visualized is within normal limits. Lungs/Pleura: Lungs are well aerated bilaterally although patchy ground-glass airspace opacity is noted throughout both lungs consistent with multifocal pneumonia. More focal consolidation is noted in the left lower lobe. Scattered calcified granulomas are seen. Upper Abdomen: Visualized upper abdomen demonstrates a stable hypodensity within the medial segment of the left lobe of the liver consistent with prior ablation. Multiple splenic and hepatic granulomas are seen. No other focal abnormality is noted. Musculoskeletal: Degenerative changes of thoracic spine are seen. No rib abnormality is noted. Review of the MIP images confirms the above findings. IMPRESSION: No evidence of pulmonary emboli. Motion artifact somewhat limits evaluation of the lower lobe branches. Diffuse bilateral ground-glass opacities consistent with multifocal pneumonia. More marked consolidation is noted in the left lower lobe. Stable post ablation defect within the liver. Changes of prior granulomatous disease. Aortic Atherosclerosis (ICD10-I70.0). Electronically Signed    By: Inez Catalina M.D.   On: 06/06/2021 15:13    EKG: Independently reviewed.  Sinus tachycardia   Assessment/Plan:   Principal Problem:   Sepsis ([place]) Active Problems:   Multifocal pneumonia   Body mass index is 28.66 kg/m.  (Overweight)  Severe sepsis secondary to multifocal pneumonia: Admit to progressive cardiac unit.  Treat with IV fluids and empiric IV antibiotics.  Follow-up blood cultures.  Acute asthma exacerbation: Treat with IV steroids and bronchodilators.  Acute hypoxic respiratory failure: Continue 4 L/min oxygen via nasal cannula and taper off oxygen as able.  Oral candidiasis: Treat with nystatin swish and swallow.  Stage IV rectal cancer with metastasis to lungs, liver and ribs: Outpatient follow-up with oncologist.  Hypertension: Hold antihypertensives.  Other information:   DVT prophylaxis: Lovenox   Code Status: Full code. Family Communication: None  Disposition Plan: Possible discharge to home in 2 to 3 days Consults called: None Admission status: Inpatient  The medical decision making on this patient was of high complexity and the patient is at high risk for clinical deterioration, therefore this is a level 3 visit.     Biviana Saddler [facility] Pager: Please check [email or web address]   How to contact the [facility] Attending or Consulting provider [place] or covering provider during after hours [place], for this patient?   Check the care team in [facility] and look for a) attending/consulting [facility] provider listed and b) the [facility] team listed Log into [email or web address] and use 's universal password to access. If you do not have the password, please contact the hospital operator. Locate the [facility] provider you are looking for under [facility] and page to a number that you can be directly reached. If you still have difficulty reaching the provider, please page the [facility] (Director on Call) for the Hospitalists listed on amion for  assistance.  06/06/2021, 4:07 PM

## 2021-06-06 NOTE — ED Notes (Signed)
Patient transported to CT 

## 2021-06-06 NOTE — ED Provider Notes (Signed)
[facility] Emergency Department Provider Note   ____________________________________________   Event Date/Time   First MD Initiated Contact with Patient 06/06/21 1543     (approximate)  I have reviewed the triage vital signs and the nursing notes.   HISTORY  Chief Complaint Shortness of Breath    HPI Tamara Hall is a 65 y.o. female history of metastatic cancer  Presents for shortness of breath slightly worsening over 2 weeks been dramatically worsening over the last 4 to 5 days.  No fever no chills but is cough present and wheezing.  Ports severe shortness of breath specially with exertion.  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   Cough    Hypertension    Pneumonia    20+ years ago    Patient Active Problem List   Diagnosis Date Noted   Sepsis ([place]) 06/06/2021   Stage 3 severe COPD by GOLD classification ([place]) 11/16/2020   Shortness of breath 09/10/2020   Ground glass opacity present on imaging of lung 08/30/2020   Atypical pneumonia 08/30/2020   Gastroesophageal reflux disease with esophagitis 06/04/2020   Folate deficiency 12/04/2019   B12 deficiency 11/15/2019   H/O small bowel obstruction 10/29/2019   Pain in pelvis 10/28/2019   SBO (small bowel obstruction) ([place]) 08/13/2019   Diarrhea 07/11/2017   Hypokalemia 07/08/2017   Malignant neoplasm metastatic to left lung ([place]) 05/27/2017   Nausea without vomiting 05/06/2017   Liver metastases ([place]) 02/25/2017   Goals of care, counseling/discussion 02/18/2017   Encounter for antineoplastic chemotherapy 02/18/2017   Cancer related pain 02/16/2017   Tobacco use 02/06/2017   Abnormal  findings-gastrointestinal tract    Rectum neoplasm    Rectal bleeding 01/13/2017   Rectal cancer [facility])     Past Surgical History:  Procedure Laterality Date   ABDOMINAL HYSTERECTOMY     BREAST SURGERY     COLON SURGERY  10/2017   [facility]   COLONOSCOPY WITH PROPOFOL N/A 01/14/2017   Procedure: COLONOSCOPY WITH PROPOFOL;  Surgeon: Lucilla Lame, MD;  Location: [facility] ENDOSCOPY;  Service: Endoscopy;  Laterality: N/A;   ELBOW SURGERY Right    Has had 4 surgeries on right elbow.   HAND SURGERY Right 2008   JOINT REPLACEMENT     LIVER BIOPSY  10/2017   lung wedge resection  10/2017   PORTA CATH INSERTION N/A 01/29/2017   Procedure: Glori Luis Cath Insertion;  Surgeon: Algernon Huxley, MD;  Location: [place] [facility];  Service: Cardiovascular;  Laterality: N/A;   TONSILLECTOMY      Prior to Admission medications   Medication Sig Start Date End Date Taking? Authorizing Provider  albuterol (VENTOLIN HFA) 108 (90 Base) MCG/ACT inhaler Inhale 1-2 puffs into the lungs every 6 (six) hours as needed for wheezing or shortness of breath.  01/22/19  Yes [provider]  amLODipine (NORVASC) 5 MG tablet Take 5 mg by mouth daily. 08/02/19  Yes [provider]  baclofen (LIORESAL) 10 MG tablet Take 10 mg by mouth 3 (three) times daily.   Yes [provider]  Calcium Carbonate (CALCIUM 500 PO) Take 1 tablet by mouth daily.   Yes [provider]  dexamethasone (DECADRON) 4 MG tablet Take 1 tablet (4 mg total) by mouth 3 (three) times daily. 05/28/21  Yes Cammie Sickle, MD  fentaNYL (DURAGESIC) 50 MCG/HR Place 1 patch onto the skin every 3 (three) days. 05/28/21  Yes Cammie Sickle, MD  folic acid (FOLVITE) 1 MG tablet Take 1 tablet (1 mg total) by mouth daily. 06/05/21  Yes Cammie Sickle, MD  gabapentin (NEURONTIN) 300 MG capsule Take 1,200 mg by mouth 3 (three) times daily.  05/14/19 08/31/27 Yes [provider]  HYDROcodone-acetaminophen ([place]) 10-325 MG  tablet SMARTSIG:1 Tablet(s) By Mouth 4-5 Times Daily 08/28/20  Yes [provider]  nortriptyline (PAMELOR) 50 MG capsule Take 50 mg by mouth at bedtime. 07/03/20  Yes [provider]  omeprazole (PRILOSEC) 20 MG capsule TAKE 1 CAPSULE BY MOUTH EVERY DAY 05/01/21  Yes Sindy Guadeloupe, MD  potassium chloride SA (KLOR-CON M20) 20 MEQ tablet Take 1 tablet (20 mEq total) by mouth 3 (three) times daily. 04/20/21  Yes Verlon Au, NP  valsartan (DIOVAN) 80 MG tablet Take 80 mg by mouth daily. 05/11/21  Yes [provider]  budesonide (PULMICORT) 0.5 MG/2ML nebulizer solution Take 2 mLs (0.5 mg total) by nebulization 2 (two) times daily. Patient not taking: No sig reported 05/16/21   Tyler Pita, MD  chlorpheniramine-HYDROcodone (TUSSIONEX) 10-8 MG/5ML SUER Take 5 mLs by mouth at bedtime as needed for cough. 05/28/21   Cammie Sickle, MD  ipratropium-albuterol (DUONEB) 0.5-2.5 (3) MG/3ML SOLN Take 3 mLs by nebulization every 6 (six) hours as needed. Patient not taking: No sig reported 05/10/21   Tyler Pita, MD  Ostomy Supplies (PREMIER DRAINABLE [facility] [phone]) Pouch MISC  12/20/19   [provider]    Allergies Patient has no known allergies.  Family History  Problem Relation Age of Onset   Lung cancer Mother 59   Esophageal cancer Father 59   Brain cancer Maternal Grandmother     Social History Social History   Tobacco Use   Smoking status: Every Day    Packs/day: 1.50    Years: 40.00    Pack years: 60.00    Types: Cigarettes   Smokeless tobacco: Never   Tobacco comments:    2 cigs daily-05/10/2021  Vaping Use   Vaping Use: Never used  Substance Use Topics   Alcohol use: No   Drug use: No    Review of Systems Constitutional: No fever/chills Eyes: No visual changes. ENT: No sore throat. Cardiovascular: See HPI respiratory: See HPI Gastrointestinal: No abdominal pain.   Genitourinary: Negative for dysuria.  Skin: Negative for  rash. Neurological: Negative for headaches.    ____________________________________________   PHYSICAL EXAM:  VITAL SIGNS: ED Triage Vitals  Enc Vitals Group     BP 06/06/21 1118 (!) 136/54     Pulse Rate 06/06/21 1117 (!) 102     Resp 06/06/21 1115 (!) 22     Temp 06/06/21 1119 98.7 F (37.1 C)     Temp Source 06/06/21 1119 Oral     SpO2 06/06/21 1115 (!) 87 %     Weight 06/06/21 1116 182 lb 15.7 oz (83 kg)     Height 06/06/21 1116 5\' 7"  (1.702 m)     Head Circumference --      Peak Flow --      Pain Score 06/06/21 1115 6     Pain Loc --      Pain Edu? --  Excl. in [place]? --     Constitutional: Alert and oriented. Well appearing and in no acute distress. Eyes: Conjunctivae are normal. Head: Atraumatic. Nose: No congestion/rhinnorhea. Mouth/Throat: Mucous membranes are moist.  Slight purulent appearance of the posterior oropharynx does not appear to be grayish in nature does not appear that of diphtheria.  She does have mild thrush-like appearance of the tongue Neck: No stridor.  Cardiovascular: Normal rate, regular rhythm. Grossly normal heart sounds.  Good peripheral circulation. Respiratory: Mild tachypnea, diminished lung sounds with moderate end expiratory wheezing noted throughout.  No acute extremitas but does have mild to moderate increased work of breathing slight accessory muscle use Gastrointestinal: Soft and nontender. No distention. Musculoskeletal: No lower extremity tenderness nor edema. Neurologic:  Normal speech and language. No gross focal neurologic deficits are appreciated.  Skin:  Skin is warm, dry and intact. No rash noted. Psychiatric: Mood and affect are normal. Speech and behavior are normal.  ____________________________________________   LABS (all labs ordered are listed, but only abnormal results are displayed)  Labs Reviewed  COMPREHENSIVE METABOLIC PANEL - Abnormal; Notable for the following components:      Result Value   Chloride 97  (*)    Glucose, Bld 123 (*)    Calcium 8.5 (*)    Albumin 3.0 (*)    All other components within normal limits  CBC WITH DIFFERENTIAL/PLATELET - Abnormal; Notable for the following components:   WBC 18.9 (*)    Neutro Abs 16.0 (*)    Abs Immature Granulocytes 0.93 (*)    All other components within normal limits  URINALYSIS, COMPLETE (UACMP) WITH MICROSCOPIC - Abnormal; Notable for the following components:   Color, Urine YELLOW (*)    APPearance HAZY (*)    Hgb urine dipstick SMALL (*)    Leukocytes,Ua LARGE (*)    Bacteria, UA MANY (*)    All other components within normal limits  GROUP A STREP BY PCR  RESP PANEL BY RT-PCR (FLU A&B, COVID) ARPGX2  CULTURE, BLOOD (ROUTINE X 2)  CULTURE, BLOOD (ROUTINE X 2)  MRSA NEXT GEN BY PCR, NASAL  URINE CULTURE  LACTIC ACID, PLASMA   ____________________________________________  EKG  Reviewed inter by me at 1102 Heart rate sinus tachycardia, mild nonspecific T wave abnormality without evidence of obvious acute ischemia. ____________________________________________  RADIOLOGY  DG Chest 2 View  Result Date: 06/06/2021 CLINICAL DATA:  Shortness of breath.  History of lung cancer EXAM: CHEST - 2 VIEW COMPARISON:  08/24/2020, 10/31/2020 FINDINGS: Stable positioning of a right-sided chest port. Heart size within normal limits. Aortic atherosclerosis. Diffuse interstitial prominence bilaterally. Patchy opacities within the right mid to lower lung. Additional more focal opacity in the periphery of the left mid lung. No pleural effusion. No pneumothorax. IMPRESSION: Patchy opacities within the right mid to lower lung and periphery of the left mid lung, as described. Findings may reflect multifocal infection or edema. Progression of known malignancy is not excluded. Electronically Signed   By: Davina Poke D.O.   On: 06/06/2021 12:39   CT Angio Chest PE W and/or Wo Contrast  Result Date: 06/06/2021 CLINICAL DATA:  Shortness of breath and  generalized chest pain EXAM: CT ANGIOGRAPHY CHEST WITH CONTRAST TECHNIQUE: Multidetector CT imaging of the chest was performed using the standard protocol during bolus administration of intravenous contrast. Multiplanar CT image reconstructions and MIPs were obtained to evaluate the vascular anatomy. CONTRAST:  39mL OMNIPAQUE IOHEXOL 350 MG/ML SOLN COMPARISON:  Chest x-ray from earlier in the same  day, CT from 10/31/2020 FINDINGS: Cardiovascular: Atherosclerotic calcifications are noted without aneurysmal dilatation or dissection. No cardiac enlargement is noted. Mild coronary calcifications are seen. The pulmonary artery shows a normal branching pattern without discrete filling defect to suggest pulmonary embolism. The lower lobe branches are somewhat limited in evaluation to patient motion artifact. Right chest wall port is noted. Mediastinum/Nodes: Thoracic inlet is within normal limits. Multiple hilar and mediastinal calcified lymph nodes are noted consistent with prior granulomatous disease. No sizable adenopathy is seen. The esophagus as visualized is within normal limits. Lungs/Pleura: Lungs are well aerated bilaterally although patchy ground-glass airspace opacity is noted throughout both lungs consistent with multifocal pneumonia. More focal consolidation is noted in the left lower lobe. Scattered calcified granulomas are seen. Upper Abdomen: Visualized upper abdomen demonstrates a stable hypodensity within the medial segment of the left lobe of the liver consistent with prior ablation. Multiple splenic and hepatic granulomas are seen. No other focal abnormality is noted. Musculoskeletal: Degenerative changes of thoracic spine are seen. No rib abnormality is noted. Review of the MIP images confirms the above findings. IMPRESSION: No evidence of pulmonary emboli. Motion artifact somewhat limits evaluation of the lower lobe branches. Diffuse bilateral ground-glass opacities consistent with multifocal  pneumonia. More marked consolidation is noted in the left lower lobe. Stable post ablation defect within the liver. Changes of prior granulomatous disease. Aortic Atherosclerosis (ICD10-I70.0). Electronically Signed   By: Inez Catalina M.D.   On: 06/06/2021 15:13    Chest x-ray reviewed concerning for multiple airspace opacities.  CT of the chest notable for diffuse bilateral groundglass opacities. ____________________________________________   PROCEDURES  Procedure(s) performed: None  Procedures  Critical Care performed: Yes, see critical care note(s)  CRITICAL CARE Performed by: Delman Kitten   Total critical care time: 35 minutes  Critical care time was exclusive of separately billable procedures and treating other patients.  Critical care was necessary to treat or prevent imminent or life-threatening deterioration.  Critical care was time spent personally by me on the following activities: development of treatment plan with patient and/or surrogate as well as nursing, discussions with consultants, evaluation of patient's response to treatment, examination of patient, obtaining history from patient or surrogate, ordering and performing treatments and interventions, ordering and review of laboratory studies, ordering and review of radiographic studies, pulse oximetry and re-evaluation of patient's condition.  ____________________________________________   INITIAL IMPRESSION / ASSESSMENT AND PLAN / ED COURSE  Pertinent labs & imaging results that were available during my care of the patient were reviewed by me and considered in my medical decision making (see chart for details).   Patient presents for hypoxia, improved on 3 L nasal cannula.  Patient does become hypoxic with exertion, dropping her saturation to 80% walking to the toilet and back.  She quickly recovers as well at rest but on oxygen which she is not typically using at home.  Clinical evaluation clinical history labs and  symptoms seem to point towards a cause of likely infectious multifocal pneumonia.  Also I suspect an exacerbation of COPD.  No evidence of PE.  No clear evidence suggest acute ACS.  She does report left-sided rib pain but this is a known issue, and also there is concern about recurrence of metastatic cancer.  Will admit to the hospitalist internal medicine service for further care and work-up.  Patient understanding agreeable with this plan.  Code sepsis activated broad-spectrum antibiotics initiated.      ____________________________________________   FINAL CLINICAL IMPRESSION(S) / ED DIAGNOSES  Final diagnoses:  Sepsis with acute hypoxic respiratory failure without septic shock, due to unspecified organism ([place])  Thrush  Pharyngitis, unspecified etiology        Note:  This document was prepared using Dragon voice recognition software and may include unintentional dictation errors       Delman Kitten, MD 06/06/21 2046

## 2021-06-07 DIAGNOSIS — J9601 Acute respiratory failure with hypoxia: Secondary | ICD-10-CM | POA: Diagnosis present

## 2021-06-07 LAB — CBC WITH DIFFERENTIAL/PLATELET
Abs Immature Granulocytes: 0.49 10*3/uL — ABNORMAL HIGH (ref 0.00–0.07)
Basophils Absolute: 0.1 10*3/uL (ref 0.0–0.1)
Basophils Relative: 0 %
Eosinophils Absolute: 0 10*3/uL (ref 0.0–0.5)
Eosinophils Relative: 0 %
HCT: 38.1 % (ref 36.0–46.0)
Hemoglobin: 12.8 g/dL (ref 12.0–15.0)
Immature Granulocytes: 3 %
Lymphocytes Relative: 2 %
Lymphs Abs: 0.4 10*3/uL — ABNORMAL LOW (ref 0.7–4.0)
MCH: 30.8 pg (ref 26.0–34.0)
MCHC: 33.6 g/dL (ref 30.0–36.0)
MCV: 91.6 fL (ref 80.0–100.0)
Monocytes Absolute: 0.6 10*3/uL (ref 0.1–1.0)
Monocytes Relative: 3 %
Neutro Abs: 15.8 10*3/uL — ABNORMAL HIGH (ref 1.7–7.7)
Neutrophils Relative %: 92 %
Platelets: 239 10*3/uL (ref 150–400)
RBC: 4.16 MIL/uL (ref 3.87–5.11)
RDW: 14.2 % (ref 11.5–15.5)
WBC: 17.3 10*3/uL — ABNORMAL HIGH (ref 4.0–10.5)
nRBC: 0 % (ref 0.0–0.2)

## 2021-06-07 LAB — BASIC METABOLIC PANEL
Anion gap: 6 (ref 5–15)
BUN: 17 mg/dL (ref 8–23)
CO2: 31 mmol/L (ref 22–32)
Calcium: 8.4 mg/dL — ABNORMAL LOW (ref 8.9–10.3)
Chloride: 100 mmol/L (ref 98–111)
Creatinine, Ser: 0.58 mg/dL (ref 0.44–1.00)
GFR, Estimated: >60 mL/min (ref >60)
Glucose, Bld: 140 mg/dL — ABNORMAL HIGH (ref 70–99)
Potassium: 4.2 mmol/L (ref 3.5–5.1)
Sodium: 137 mmol/L (ref 135–145)

## 2021-06-07 LAB — STREP PNEUMONIAE URINARY ANTIGEN: Strep Pneumo Urinary Antigen: NEGATIVE

## 2021-06-07 LAB — HIV ANTIBODY (ROUTINE TESTING W REFLEX): HIV Screen 4th Generation wRfx: NONREACTIVE

## 2021-06-07 MED ORDER — IRBESARTAN 150 MG PO TABS
75.0000 mg | ORAL_TABLET | Freq: Every day | ORAL | Status: DC
  Administered 2021-06-07 – 2021-06-08 (×2): 75 mg via ORAL
  Filled 2021-06-07 (×2): qty 1

## 2021-06-07 MED ORDER — HYDROMORPHONE HCL 1 MG/ML IJ SOLN
1.0000 mg | INTRAMUSCULAR | Status: DC | PRN
Start: 2021-06-07 — End: 2021-06-08
  Administered 2021-06-07 – 2021-06-08 (×4): 1 mg via INTRAVENOUS
  Filled 2021-06-07 (×4): qty 1

## 2021-06-07 MED ORDER — AMLODIPINE BESYLATE 5 MG PO TABS
5.0000 mg | ORAL_TABLET | Freq: Every day | ORAL | Status: DC
  Administered 2021-06-07 – 2021-06-08 (×2): 5 mg via ORAL
  Filled 2021-06-07 (×2): qty 1

## 2021-06-07 MED ORDER — HYDROCODONE-ACETAMINOPHEN 10-325 MG PO TABS
1.0000 | ORAL_TABLET | ORAL | Status: DC | PRN
Start: 2021-06-07 — End: 2021-06-08
  Administered 2021-06-07 – 2021-06-08 (×4): 1 via ORAL
  Filled 2021-06-07 (×4): qty 1

## 2021-06-07 NOTE — Progress Notes (Addendum)
Progress Note    Tamara Hall  MHD:[id number] DOB: Apr 13, 1956  DOA: 06/06/2021 PCP: Loni Muse, MD      Brief Narrative:    Medical records reviewed and are as summarized below:  Tamara Hall is a 65 y.o. female Tamara Hall is a 65 y.o. 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  She says she developed shortness of breath 2 days ago.  This has progressively worsened.  It is worse with exertion and associated with wheezing.  She was so short of breath yesterday that she used her husband's home oxygen.  She used her albuterol inhaler at home without any.  She said she usually has a cough because of her lung cancer.  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  She recently started dexamethasone for pain control about a week ago.  She also started fentanyl patch a day prior to admission.      Assessment/Plan:   Principal Problem:   Sepsis ([place]) Active Problems:   Rectal cancer ([place])   Multifocal pneumonia   Acute respiratory failure with hypoxia ([facility])    Body mass index is 29.54 kg/m.  (Overweight)   Severe sepsis secondary to multifocal pneumonia in an immunocompromised patient: Continue empiric IV antibiotics.  Discontinue IV fluids.  Follow-up blood cultures.  Acute asthma exacerbation: Change IV steroids to oral prednisone.  Continue bronchodilators.  Acute hypoxic respiratory failure: She remains on 4 L/min oxygen via nasal cannula.  Taper off oxygen as able.  Oral candidiasis: Continue nystatin  Stage IV rectal cancer with metastasis to lungs, liver and ribs with severe left posterior chest pain: Start IV Dilaudid for pain control.   Continue hydrocodone.  Patient does not want to take fentanyl patch anymore because she thinks it has something to do with shortness of breath and hypoxia.  Consulted palliative care to assist with pain control.  Consulted oncologist, Dr. Rogue Bussing.   Hypertension: BP is elevated.  Resume home antihypertensives (amlodipine and substitute irbesartan for valsartan.)  Leukocytosis may be partly due to steroids.  Monitor CBC.   Diet Order             Diet Heart Room service appropriate? Yes; Fluid consistency: Thin  Diet effective now                      Consultants: Palliative care  Procedures: None    Medications:    amLODipine  5 mg Oral Daily   baclofen  10 mg Oral TID   enoxaparin (LOVENOX) injection  40 mg Subcutaneous Daily   folic acid  1 mg Oral Daily   gabapentin  1,200 mg Oral TID   ipratropium-albuterol  3 mL Nebulization Q6H   irbesartan  75 mg Oral Daily   nortriptyline  50 mg Oral QHS   nystatin  5 mL Oral QID   pantoprazole  40 mg Oral Daily   predniSONE  40 mg Oral Q breakfast   Continuous Infusions:  azithromycin Stopped (06/06/21 1853)   cefTRIAXone (ROCEPHIN)  IV Stopped (06/07/21 0917)     Anti-infectives (From admission, onward)    Start     Dose/Rate Route Frequency Ordered  Stop   06/07/21 1000  cefTRIAXone (ROCEPHIN) 2 g in sodium chloride 0.9 % 100 mL IVPB        2 g 200 mL/hr over 30 Minutes Intravenous Every 24 hours 06/06/21 1633 06/12/21 0959   06/06/21 1700  azithromycin (ZITHROMAX) 500 mg in sodium chloride 0.9 % 250 mL IVPB        500 mg 250 mL/hr over 60 Minutes Intravenous Every 24 hours 06/06/21 1633 06/11/21 1659   06/06/21 1315  vancomycin (VANCOCIN) IVPB 1000 mg/200 mL premix        1,000 mg 200 mL/hr over 60 Minutes Intravenous  Once 06/06/21 1303 06/06/21 1531   06/06/21 1315  ceFEPIme (MAXIPIME) 2 g in sodium chloride 0.9 % 100 mL IVPB        2 g 200 mL/hr over 30 Minutes Intravenous  Once 06/06/21 1303 06/06/21  1415              Family Communication/Anticipated D/C date and plan/Code Status   DVT prophylaxis: enoxaparin (LOVENOX) injection 40 mg Start: 06/07/21 1000 SCDs Start: 06/06/21 1630     Code Status: Full Code  Family Communication: None Disposition Plan:    Status is: Inpatient  Remains inpatient appropriate because:IV treatments appropriate due to intensity of illness or inability to take PO and Inpatient level of care appropriate due to severity of illness  Dispo: The patient is from: Home              Anticipated d/c is to: Home              Patient currently is not medically stable to d/c.   Difficult to place patient No           Subjective:   c/o severe left mid back pain.  She is still wheezing and has some difficulty breathing.  Objective:    Vitals:   06/06/21 2100 06/06/21 2306 06/07/21 0349 06/07/21 0830  BP: 140/63 (!) 156/64 (!) 167/72 (!) 150/64  Pulse: 86 92 85 69  Resp: 19 20 20 19   Temp:  98.2 F (36.8 C) 98 F (36.7 C)   TempSrc:      SpO2: 95% 94% 95% 97%  Weight:  85.5 kg    Height:       No data found.   Intake/Output Summary (Last 24 hours) at 06/07/2021 1155 Last data filed at 06/07/2021 1147 Gross per 24 hour  Intake 1210.19 ml  Output 1300 ml  Net -89.81 ml   Filed Weights   06/06/21 1116 06/06/21 2306  Weight: 83 kg 85.5 kg    Exam:  GEN: NAD SKIN: No rash EYES: EOMI ENT: MMM, oral thrush CV: RRR PULM: B/l wheezing.  No rales heard. ABD: soft, obese, NT, +BS CNS: AAO x 3, non focal EXT: No edema or tenderness       Data Reviewed:   I have personally reviewed following labs and imaging studies:  Labs: Labs show the following:   Basic Metabolic Panel: Recent Labs  Lab 06/06/21 1113 06/07/21 0523  NA 135 137  K 3.9 4.2  CL 97* 100  CO2 30 31  GLUCOSE 123* 140*  BUN 18 17  CREATININE 0.64 0.58  CALCIUM 8.5* 8.4*   GFR Estimated Creatinine Clearance: 78.8 mL/min (by C-G formula based  on SCr of 0.58 mg/dL). Liver Function Tests: Recent Labs  Lab 06/06/21 1113  AST 19  ALT 14  ALKPHOS 92  BILITOT 0.6  PROT 6.5  ALBUMIN 3.0*  No results for input(s): LIPASE, AMYLASE in the last 168 hours. No results for input(s): AMMONIA in the last 168 hours. Coagulation profile No results for input(s): INR, PROTIME in the last 168 hours.  CBC: Recent Labs  Lab 06/06/21 1113 06/07/21 0523  WBC 18.9* 17.3*  NEUTROABS 16.0* 15.8*  HGB 13.8 12.8  HCT 40.4 38.1  MCV 92.0 91.6  PLT 273 239   Cardiac Enzymes: No results for input(s): CKTOTAL, CKMB, CKMBINDEX, TROPONINI in the last 168 hours. BNP (last 3 results) No results for input(s): PROBNP in the last 8760 hours. CBG: No results for input(s): GLUCAP in the last 168 hours. D-Dimer: No results for input(s): DDIMER in the last 72 hours. Hgb A1c: No results for input(s): HGBA1C in the last 72 hours. Lipid Profile: No results for input(s): CHOL, HDL, LDLCALC, TRIG, CHOLHDL, LDLDIRECT in the last 72 hours. Thyroid function studies: No results for input(s): TSH, T4TOTAL, T3FREE, THYROIDAB in the last 72 hours.  Invalid input(s): FREET3 Anemia work up: No results for input(s): VITAMINB12, FOLATE, FERRITIN, TIBC, IRON, RETICCTPCT in the last 72 hours. Sepsis Labs: Recent Labs  Lab 06/06/21 1113 06/07/21 0523  WBC 18.9* 17.3*  LATICACIDVEN 1.5  --     Microbiology Recent Results (from the past 240 hour(s))  Group A Strep by PCR ([place] Only)     Status: None   Collection Time: 06/06/21 12:10 PM   Specimen: Throat; Sterile Swab  Result Value Ref Range Status   Group A Strep by PCR NOT DETECTED NOT DETECTED Final    Comment: Performed at [facility], [address], [place], [place] [address]  Resp Panel by RT-PCR (Flu A&B, Covid) Throat     Status: None   Collection Time: 06/06/21 12:10 PM   Specimen: Throat; Nasopharyngeal(NP) swabs in vial transport medium  Result Value Ref Range Status   SARS  Coronavirus 2 by RT PCR NEGATIVE NEGATIVE Final    Comment: (NOTE) SARS-CoV-2 target nucleic acids are NOT DETECTED.  The SARS-CoV-2 RNA is generally detectable in upper respiratory specimens during the acute phase of infection. The lowest concentration of SARS-CoV-2 viral copies this assay can detect is 138 copies/mL. A negative result does not preclude SARS-Cov-2 infection and should not be used as the sole basis for treatment or other patient management decisions. A negative result may occur with  improper specimen collection/handling, submission of specimen other than nasopharyngeal swab, presence of viral mutation(s) within the areas targeted by this assay, and inadequate number of viral copies(<138 copies/mL). A negative result must be combined with clinical observations, patient history, and epidemiological information. The expected result is Negative.  Fact Sheet for Patients:  EntrepreneurPulse.com.au  Fact Sheet for Healthcare Providers:  IncredibleEmployment.be  This test is no t yet approved or cleared by the [place] FDA and  has been authorized for detection and/or diagnosis of SARS-CoV-2 by FDA under an Emergency Use Authorization (EUA). This EUA will remain  in effect (meaning this test can be used) for the duration of the COVID-19 declaration under Section 564(b)(1) of the Act, 21 U.S.C.section 360bbb-3(b)(1), unless the authorization is terminated  or revoked sooner.       Influenza A by PCR NEGATIVE NEGATIVE Final   Influenza B by PCR NEGATIVE NEGATIVE Final    Comment: (NOTE) The Xpert Xpress SARS-CoV-2/FLU/RSV plus assay is intended as an aid in the diagnosis of influenza from Nasopharyngeal swab specimens and should not be used as a sole basis for treatment. Nasal washings and aspirates  are unacceptable for Xpert Xpress SARS-CoV-2/FLU/RSV testing.  Fact Sheet for  Patients: EntrepreneurPulse.com.au  Fact Sheet for Healthcare Providers: IncredibleEmployment.be  This test is not yet approved or cleared by the [place] FDA and has been authorized for detection and/or diagnosis of SARS-CoV-2 by FDA under an Emergency Use Authorization (EUA). This EUA will remain in effect (meaning this test can be used) for the duration of the COVID-19 declaration under Section 564(b)(1) of the Act, 21 U.S.C. section 360bbb-3(b)(1), unless the authorization is terminated or revoked.  Performed at [facility], [address] [place], [place] [address]   Culture, blood (Routine X 2) w Reflex to ID Panel     Status: None (Preliminary result)   Collection Time: 06/06/21  1:15 PM   Specimen: BLOOD  Result Value Ref Range Status   Specimen Description BLOOD LEFT ANTECUBITAL  Final   Special Requests   Final    BOTTLES DRAWN AEROBIC AND ANAEROBIC Blood Culture adequate volume   Culture   Final    NO GROWTH < 24 HOURS Performed at [facility], [address], [place], [place] [address]    Report Status PENDING  Incomplete  Culture, blood (Routine X 2) w Reflex to ID Panel     Status: None (Preliminary result)   Collection Time: 06/06/21  1:15 PM   Specimen: BLOOD  Result Value Ref Range Status   Specimen Description BLOOD RIGHT ANTECUBITAL  Final   Special Requests   Final    BOTTLES DRAWN AEROBIC AND ANAEROBIC Blood Culture adequate volume   Culture   Final    NO GROWTH < 24 HOURS Performed at [facility], [address], [place], [place] [address]    Report Status PENDING  Incomplete    Procedures and diagnostic studies:  DG Chest 2 View  Result Date: 06/06/2021 CLINICAL DATA:  Shortness of breath.  History of lung cancer EXAM: CHEST - 2 VIEW COMPARISON:  08/24/2020, 10/31/2020 FINDINGS: Stable positioning of a right-sided chest port. Heart size within normal limits. Aortic  atherosclerosis. Diffuse interstitial prominence bilaterally. Patchy opacities within the right mid to lower lung. Additional more focal opacity in the periphery of the left mid lung. No pleural effusion. No pneumothorax. IMPRESSION: Patchy opacities within the right mid to lower lung and periphery of the left mid lung, as described. Findings may reflect multifocal infection or edema. Progression of known malignancy is not excluded. Electronically Signed   By: Davina Poke D.O.   On: 06/06/2021 12:39   CT Angio Chest PE W and/or Wo Contrast  Result Date: 06/06/2021 CLINICAL DATA:  Shortness of breath and generalized chest pain EXAM: CT ANGIOGRAPHY CHEST WITH CONTRAST TECHNIQUE: Multidetector CT imaging of the chest was performed using the standard protocol during bolus administration of intravenous contrast. Multiplanar CT image reconstructions and MIPs were obtained to evaluate the vascular anatomy. CONTRAST:  65mL OMNIPAQUE IOHEXOL 350 MG/ML SOLN COMPARISON:  Chest x-ray from earlier in the same day, CT from 10/31/2020 FINDINGS: Cardiovascular: Atherosclerotic calcifications are noted without aneurysmal dilatation or dissection. No cardiac enlargement is noted. Mild coronary calcifications are seen. The pulmonary artery shows a normal branching pattern without discrete filling defect to suggest pulmonary embolism. The lower lobe branches are somewhat limited in evaluation to patient motion artifact. Right chest wall port is noted. Mediastinum/Nodes: Thoracic inlet is within normal limits. Multiple hilar and mediastinal calcified lymph nodes are noted consistent with prior granulomatous disease. No sizable adenopathy is seen. The esophagus as visualized is  within normal limits. Lungs/Pleura: Lungs are well aerated bilaterally although patchy ground-glass airspace opacity is noted throughout both lungs consistent with multifocal pneumonia. More focal consolidation is noted in the left lower lobe. Scattered  calcified granulomas are seen. Upper Abdomen: Visualized upper abdomen demonstrates a stable hypodensity within the medial segment of the left lobe of the liver consistent with prior ablation. Multiple splenic and hepatic granulomas are seen. No other focal abnormality is noted. Musculoskeletal: Degenerative changes of thoracic spine are seen. No rib abnormality is noted. Review of the MIP images confirms the above findings. IMPRESSION: No evidence of pulmonary emboli. Motion artifact somewhat limits evaluation of the lower lobe branches. Diffuse bilateral ground-glass opacities consistent with multifocal pneumonia. More marked consolidation is noted in the left lower lobe. Stable post ablation defect within the liver. Changes of prior granulomatous disease. Aortic Atherosclerosis (ICD10-I70.0). Electronically Signed   By: Inez Catalina M.D.   On: 06/06/2021 15:13               LOS: 1 day   Jair Lindblad  [facility]   Pager on [email or web address]. If 7PM-7AM, please contact night-coverage at [email or web address]     06/07/2021, 11:55 AM

## 2021-06-07 NOTE — Plan of Care (Signed)
  Problem: Education: Goal: Knowledge of General Education information will improve Description: Including pain rating scale, medication(s)/side effects and non-pharmacologic comfort measures Outcome: Progressing   Problem: Clinical Measurements: Goal: Ability to maintain clinical measurements within normal limits will improve Outcome: Progressing Goal: Will remain free from infection Outcome: Progressing Goal: Diagnostic test results will improve Outcome: Progressing Goal: Respiratory complications will improve Outcome: Progressing   Problem: Activity: Goal: Risk for activity intolerance will decrease Outcome: Progressing   Problem: Nutrition: Goal: Adequate nutrition will be maintained Outcome: Progressing   Problem: Coping: Goal: Level of anxiety will decrease Outcome: Progressing   Problem: Elimination: Goal: Will not experience complications related to bowel motility Outcome: Progressing Goal: Will not experience complications related to urinary retention Outcome: Progressing   Problem: Pain Managment: Goal: General experience of comfort will improve Outcome: Progressing

## 2021-06-08 DIAGNOSIS — C20 Malignant neoplasm of rectum: Secondary | ICD-10-CM | POA: Diagnosis not present

## 2021-06-08 DIAGNOSIS — B370 Candidal stomatitis: Secondary | ICD-10-CM

## 2021-06-08 DIAGNOSIS — Z515 Encounter for palliative care: Secondary | ICD-10-CM

## 2021-06-08 LAB — CBC WITH DIFFERENTIAL/PLATELET
Abs Immature Granulocytes: 0.38 10*3/uL — ABNORMAL HIGH (ref 0.00–0.07)
Basophils Absolute: 0 10*3/uL (ref 0.0–0.1)
Basophils Relative: 0 %
Eosinophils Absolute: 0 10*3/uL (ref 0.0–0.5)
Eosinophils Relative: 0 %
HCT: 37.3 % (ref 36.0–46.0)
Hemoglobin: 12.5 g/dL (ref 12.0–15.0)
Immature Granulocytes: 2 %
Lymphocytes Relative: 5 %
Lymphs Abs: 0.8 10*3/uL (ref 0.7–4.0)
MCH: 31.6 pg (ref 26.0–34.0)
MCHC: 33.5 g/dL (ref 30.0–36.0)
MCV: 94.2 fL (ref 80.0–100.0)
Monocytes Absolute: 0.8 10*3/uL (ref 0.1–1.0)
Monocytes Relative: 5 %
Neutro Abs: 13.9 10*3/uL — ABNORMAL HIGH (ref 1.7–7.7)
Neutrophils Relative %: 88 %
Platelets: 233 10*3/uL (ref 150–400)
RBC: 3.96 MIL/uL (ref 3.87–5.11)
RDW: 14.3 % (ref 11.5–15.5)
WBC: 15.9 10*3/uL — ABNORMAL HIGH (ref 4.0–10.5)
nRBC: 0.1 % (ref 0.0–0.2)

## 2021-06-08 LAB — BASIC METABOLIC PANEL
Anion gap: 4 — ABNORMAL LOW (ref 5–15)
BUN: 23 mg/dL (ref 8–23)
CO2: 33 mmol/L — ABNORMAL HIGH (ref 22–32)
Calcium: 8.1 mg/dL — ABNORMAL LOW (ref 8.9–10.3)
Chloride: 100 mmol/L (ref 98–111)
Creatinine, Ser: 0.73 mg/dL (ref 0.44–1.00)
GFR, Estimated: >60 mL/min (ref >60)
Glucose, Bld: 88 mg/dL (ref 70–99)
Potassium: 3.8 mmol/L (ref 3.5–5.1)
Sodium: 137 mmol/L (ref 135–145)

## 2021-06-08 LAB — LEGIONELLA PNEUMOPHILA SEROGP 1 UR AG: L. pneumophila Serogp 1 Ur Ag: NEGATIVE

## 2021-06-08 LAB — URINE CULTURE

## 2021-06-08 MED ORDER — CEFDINIR 300 MG PO CAPS: 300.0000 mg | ORAL_CAPSULE | Freq: Two times a day (BID) | ORAL | Status: DC

## 2021-06-08 MED ORDER — CEFDINIR 300 MG PO CAPS: 300.0000 mg | ORAL_CAPSULE | Freq: Two times a day (BID) | ORAL | 0 refills | Status: AC

## 2021-06-08 NOTE — Progress Notes (Signed)
Port not accessed; CHG wipes not indicated.

## 2021-06-08 NOTE — Consult Note (Signed)
[place]  [phone] [phone]   Name: Tamara Hall Date: 06/08/2021 MRN: [id number]  DOB: 01-05-1956  Patient Care Team: Loni Muse, MD as PCP - General (Internal Medicine) Stitzenberg, Clint Lipps, MD as Referring Physician (Surgical Oncology) Lequita Asal, MD (Inactive) as Referring Physician (Hematology and Oncology) Tyler Pita, MD as Consulting Physician (Pulmonary Disease)    REASON FOR CONSULTATION: Tamara Hall is a 65 y.o. 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  Palliative care was consulted to address goals  SOCIAL HISTORY:     reports that she has been smoking cigarettes. She has a 60.00 pack-year smoking history. She has never used smokeless tobacco. She reports that she does not drink alcohol and does not use drugs.  ADVANCE DIRECTIVES:  None on file  CODE STATUS: Full code  PAST MEDICAL HISTORY: Past Medical History:  Diagnosis Date   Cancer ([place])    colon, with mets to liver and left lung   Cough    Hypertension    Pneumonia    20+ years ago    PAST SURGICAL HISTORY:  Past Surgical History:  Procedure Laterality Date   ABDOMINAL HYSTERECTOMY     BREAST SURGERY     COLON SURGERY  10/2017   [facility]   COLONOSCOPY WITH PROPOFOL N/A 01/14/2017   Procedure: COLONOSCOPY WITH PROPOFOL;  Surgeon: Lucilla Lame, MD;  Location: [facility] ENDOSCOPY;  Service: Endoscopy;  Laterality: N/A;   ELBOW SURGERY Right    Has had 4 surgeries on right elbow.   HAND SURGERY Right 2008   JOINT REPLACEMENT     LIVER BIOPSY  10/2017   lung wedge resection  10/2017   PORTA CATH INSERTION N/A 01/29/2017   Procedure: Glori Luis Cath Insertion;  Surgeon: Algernon Huxley, MD;  Location: [facility];  Service: Cardiovascular;   Laterality: N/A;   TONSILLECTOMY      HEMATOLOGY/ONCOLOGY HISTORY:  Oncology History Overview Note  She received 6 cycles of FOLFOX (02/18/2017 - 04/22/2017).               She underwent CT guided microwave ablation of the liver lesion on 06/06/2017.               She received radiation and daily Xeloda (06/18/2017- 08/15/2017).             She underwent APR with descending colostomy and VRAM flap on 10/24/2017.              She underwent a VATS wedge resection of her left lung.             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                         She declined left lower lobe lobectomy.                         She received 1 cycle of FOLFIRI (03/08/2020).                           She declined further chemotherapy or surgery.             She completed SBRT  to the left infrahilar nodule on 05/22/2020.             CEA was 40.9 on 06/13/2020 and 9.5 on 10/31/2020.              Chest, abdomen, and pelvis CT on 10/31/2020 was personally reviewed.  Agree with radiology findings.   Rectal cancer ([place])  01/13/2017 Initial Diagnosis   Rectal cancer ([place])   03/08/2020 -  Chemotherapy   The patient had dexamethasone (DECADRON) 4 MG tablet, 8 mg, Oral, Daily, 1 of 1 cycle, Start date: 03/08/2020, End date: 06/28/2020 palonosetron (ALOXI) injection 0.25 mg, 0.25 mg, Intravenous,  Once, 1 of 4 cycles Administration: 0.25 mg (03/08/2020) pegfilgrastim-jmdb (FULPHILA) injection 6 mg, 6 mg, Subcutaneous,  Once, 1 of 4 cycles Administration: 6 mg (03/10/2020) irinotecan (CAMPTOSAR) 360 mg in sodium chloride 0.9 % 500 mL chemo infusion, 180 mg/m2 = 360 mg, Intravenous,  Once, 1 of 4 cycles Administration: 360 mg (03/08/2020) fluorouracil (ADRUCIL) chemo injection 800 mg, 400 mg/m2 = 800 mg, Intravenous,  Once, 1 of 4 cycles Administration: 800 mg (03/08/2020) fluorouracil (ADRUCIL) 5,000 mg in sodium chloride 0.9 % 150 mL chemo infusion, 2,475 mg/m2 = 4,850 mg,  Intravenous, 1 Day/Dose, 1 of 4 cycles Administration: 5,000 mg (03/08/2020) bevacizumab-bvzr (ZIRABEV) 400 mg in sodium chloride 0.9 % 100 mL chemo infusion, 5 mg/kg = 400 mg, Intravenous,  Once, 0 of 3 cycles leucovorin 812 mg in sodium chloride 0.9 % 250 mL infusion, 400 mg/m2 = 812 mg, Intravenous,  Once, 1 of 4 cycles Administration: 812 mg (03/08/2020)   for chemotherapy treatment.       ALLERGIES:  has No Known Allergies.  MEDICATIONS:  Current Facility-Administered Medications  Medication Dose Route Frequency Provider Last Rate Last Admin   albuterol (PROVENTIL) (2.5 MG/3ML) 0.083% nebulizer solution 2.5 mg  2.5 mg Nebulization Q6H PRN Jennye Boroughs, MD       amLODipine (NORVASC) tablet 5 mg  5 mg Oral Daily Jennye Boroughs, MD   5 mg at 06/08/21 0935   baclofen (LIORESAL) tablet 10 mg  10 mg Oral TID Jennye Boroughs, MD   10 mg at 06/08/21 0935   [START ON 06/09/2021] cefdinir (OMNICEF) capsule 300 mg  300 mg Oral Q12H Cherene Altes, MD       enoxaparin (LOVENOX) injection 40 mg  40 mg Subcutaneous Daily Jennye Boroughs, MD   40 mg at 59/74/16 3845   folic acid (FOLVITE) tablet 1 mg  1 mg Oral Daily Jennye Boroughs, MD   1 mg at 06/08/21 0935   gabapentin (NEURONTIN) capsule 1,200 mg  1,200 mg Oral TID Jennye Boroughs, MD   1,200 mg at 06/08/21 0935   HYDROcodone-acetaminophen (NORCO) 10-325 MG per tablet 1 tablet  1 tablet Oral Q4H PRN Jennye Boroughs, MD   1 tablet at 06/08/21 1047   HYDROmorphone (DILAUDID) injection 1 mg  1 mg Intravenous Q4H PRN Jennye Boroughs, MD   1 mg at 06/08/21 1149   ipratropium-albuterol (DUONEB) 0.5-2.5 (3) MG/3ML nebulizer solution 3 mL  3 mL Nebulization Q6H Jennye Boroughs, MD   3 mL at 06/08/21 0749   irbesartan (AVAPRO) tablet 75 mg  75 mg Oral Daily Jennye Boroughs, MD   75 mg at 06/08/21 0935   nortriptyline (PAMELOR) capsule 50 mg  50 mg Oral QHS Jennye Boroughs, MD   50 mg at 06/07/21 2108   nystatin (MYCOSTATIN) 100000 UNIT/ML suspension 500,000  Units  5 mL Oral QID Jennye Boroughs, MD   500,000  Units at 06/08/21 0804   pantoprazole (PROTONIX) EC tablet 40 mg  40 mg Oral Daily Jennye Boroughs, MD   40 mg at 06/08/21 0935   predniSONE (DELTASONE) tablet 40 mg  40 mg Oral Q breakfast Jennye Boroughs, MD   40 mg at 06/08/21 0804    VITAL SIGNS: BP 129/69 (BP Location: Right Arm)   Pulse 87   Temp 98.3 F (36.8 C) (Oral)   Resp 18   Ht _0  (1.702 m)   Wt 188 lb 9.6 oz (85.5 kg)   SpO2 92%   BMI 29.54 kg/m  Filed Weights   06/06/21 1116 06/06/21 2306  Weight: 182 lb 15.7 oz (83 kg) 188 lb 9.6 oz (85.5 kg)    Estimated body mass index is 29.54 kg/m as calculated from the following:   Height as of this encounter: _1  (1.702 m).   Weight as of this encounter: 188 lb 9.6 oz (85.5 kg).  LABS: CBC:    Component Value Date/Time   WBC 15.9 (H) 06/08/2021 0433   HGB 12.5 06/08/2021 0433   HCT 37.3 06/08/2021 0433   PLT 233 06/08/2021 0433   MCV 94.2 06/08/2021 0433   NEUTROABS 13.9 (H) 06/08/2021 0433   LYMPHSABS 0.8 06/08/2021 0433   MONOABS 0.8 06/08/2021 0433   EOSABS 0.0 06/08/2021 0433   BASOSABS 0.0 06/08/2021 0433   Comprehensive Metabolic Panel:    Component Value Date/Time   NA 137 06/08/2021 0433   K 3.8 06/08/2021 0433   CL 100 06/08/2021 0433   CO2 33 (H) 06/08/2021 0433   BUN 23 06/08/2021 0433   CREATININE 0.73 06/08/2021 0433   GLUCOSE 88 06/08/2021 0433   CALCIUM 8.1 (L) 06/08/2021 0433   AST 19 06/06/2021 1113   ALT 14 06/06/2021 1113   ALKPHOS 92 06/06/2021 1113   BILITOT 0.6 06/06/2021 1113   PROT 6.5 06/06/2021 1113   ALBUMIN 3.0 (L) 06/06/2021 1113    RADIOGRAPHIC STUDIES: DG Chest 2 View  Result Date: 06/06/2021 CLINICAL DATA:  Shortness of breath.  History of lung cancer EXAM: CHEST - 2 VIEW COMPARISON:  08/24/2020, 10/31/2020 FINDINGS: Stable positioning of a right-sided chest port. Heart size within normal limits. Aortic atherosclerosis. Diffuse interstitial prominence bilaterally.  Patchy opacities within the right mid to lower lung. Additional more focal opacity in the periphery of the left mid lung. No pleural effusion. No pneumothorax. IMPRESSION: Patchy opacities within the right mid to lower lung and periphery of the left mid lung, as described. Findings may reflect multifocal infection or edema. Progression of known malignancy is not excluded. Electronically Signed   By: Davina Poke D.O.   On: 06/06/2021 12:39   CT Angio Chest PE W and/or Wo Contrast  Result Date: 06/06/2021 CLINICAL DATA:  Shortness of breath and generalized chest pain EXAM: CT ANGIOGRAPHY CHEST WITH CONTRAST TECHNIQUE: Multidetector CT imaging of the chest was performed using the standard protocol during bolus administration of intravenous contrast. Multiplanar CT image reconstructions and MIPs were obtained to evaluate the vascular anatomy. CONTRAST:  71m OMNIPAQUE IOHEXOL 350 MG/ML SOLN COMPARISON:  Chest x-ray from earlier in the same day, CT from 10/31/2020 FINDINGS: Cardiovascular: Atherosclerotic calcifications are noted without aneurysmal dilatation or dissection. No cardiac enlargement is noted. Mild coronary calcifications are seen. The pulmonary artery shows a normal branching pattern without discrete filling defect to suggest pulmonary embolism. The lower lobe branches are somewhat limited in evaluation to patient motion artifact. Right chest wall port is noted. Mediastinum/Nodes: Thoracic  inlet is within normal limits. Multiple hilar and mediastinal calcified lymph nodes are noted consistent with prior granulomatous disease. No sizable adenopathy is seen. The esophagus as visualized is within normal limits. Lungs/Pleura: Lungs are well aerated bilaterally although patchy ground-glass airspace opacity is noted throughout both lungs consistent with multifocal pneumonia. More focal consolidation is noted in the left lower lobe. Scattered calcified granulomas are seen. Upper Abdomen: Visualized upper  abdomen demonstrates a stable hypodensity within the medial segment of the left lobe of the liver consistent with prior ablation. Multiple splenic and hepatic granulomas are seen. No other focal abnormality is noted. Musculoskeletal: Degenerative changes of thoracic spine are seen. No rib abnormality is noted. Review of the MIP images confirms the above findings. IMPRESSION: No evidence of pulmonary emboli. Motion artifact somewhat limits evaluation of the lower lobe branches. Diffuse bilateral ground-glass opacities consistent with multifocal pneumonia. More marked consolidation is noted in the left lower lobe. Stable post ablation defect within the liver. Changes of prior granulomatous disease. Aortic Atherosclerosis (ICD10-I70.0). Electronically Signed   By: Inez Catalina M.D.   On: 06/06/2021 15:13    PERFORMANCE STATUS (ECOG) : 2 - Symptomatic, <50% confined to bed  Review of Systems Unless otherwise noted, a complete review of systems is negative.  Physical Exam General: NAD Pulmonary: Unlabored, on O2 Extremities: no edema, no joint deformities Skin: no rashes Neurological: Weakness but otherwise nonfocal  IMPRESSION: I met briefly with patient.  She was quite emotional and anxious appearing.  She was crying saying that she wants to go home as soon as possible.  She endorsed pain and initially refused pain medication but was then accepted a dose of hydromorphone.  Patient reports that she is seeking a second opinion for cancer treatment at [facility].  This appointment is scheduled in the next couple of weeks.  She is already established with pain management at [facility].  I encouraged her to keep that appointment.  Patient will let us know whether she plans to transfer care.  If patient decides to continue receiving cancer care locally, I am happy to follow her in the clinic.  PLAN: -Continue current prescription treatment -Patient possibly transferring cancer care to [facility]  Case and plan discussed  with Dr. Rogue Bussing  Time Total: 30 minutes  Visit consisted of counseling and education dealing with the complex and emotionally intense issues of symptom management and palliative care in the setting of serious and potentially life-threatening illness.Greater than 50%  of this time was spent counseling and coordinating care related to the above assessment and plan.  Signed by: Altha Harm, PhD, NP-C

## 2021-06-08 NOTE — Care Management Important Message (Signed)
Important Message  Patient Details  Name: Tamara Hall MRN: [id number] Date of Birth: December 19, 1955   Medicare Important Message Given:  N/A - LOS <3 / Initial given by admissions  Initial Medicare Im reviewed with patient via room phone by Ardath Sax, Patient Access Associate on 06/07/2021 at 11:18am.    Dannette Barbara 06/08/2021, 9:39 AM

## 2021-06-08 NOTE — Progress Notes (Signed)
Patient O2 sats 85% at rest on room air. Patient O2 sats 94% at rest on 2L O2. Patient O2 sats 82% ambulating on room air. Patient O2 sats 92% ambulating on 2L O2. Patient recovered to 93% at rest on 2L O2 after ambulating.   Dr. Thereasa Solo MD notified.

## 2021-06-08 NOTE — Discharge Summary (Signed)
DISCHARGE SUMMARY  Tamara Hall  MR#: [id number]  DOB:[birth year]  Date of Admission: 06/06/2021 Date of Discharge: 06/08/2021  Attending Physician:Ai Sonnenfeld Hennie Duos, MD  Patient's UDJ:SHFW, Tamara Cruise, MD  Consults:  Oncology Palliative Care  Disposition: D/C home    Follow-up Appts:  Follow-up Information     Loni Muse, MD. Schedule an appointment as soon as possible for a visit in 1 week(s).   Specialty: Internal Medicine Contact information: [address] [place] [phone]                 Discharge Diagnoses: Sepsis POA Multifocal pneumonia Acute asthma exacerbation Acute hypoxic respiratory failure Oral candidiasis Stage IV rectal cancer with mets to lungs liver and ribs HTN  Initial presentation: 65 year old with a history of stage IV rectal cancer with mets to lungs and liver status post colostomy, HTN, and asthma who presented to the ER with progressive shortness of breath x48 hours.  Hospital Course:  Sepsis POA - multifocal pneumonia Has rapidly improved with empiric antibiotic therapy and volume resuscitation - strongly desires d/c home asap - familiar w/ use of home O2 due to spouse - arrange home O2 and d/c home - complete abx course in outpts setting w/ omnicef    Acute asthma exacerbation Transition from IV steroid to oral steroid - wheezing improving - respiratory effort calm and unlabored at time of d/c home    Acute hypoxic respiratory failure Due to above issues - no PE noted on CTa chest - stable on supplemental O2 at time of d/c home    Oral candidiasis Treated with nystatin - no evidence of persisting thrush at time of d/c therefore tx stopped    Stage IV rectal cancer with mets to lungs liver and ribs Pain medications titrated during this hospital stay - pain poorly controlled - patient desires d/c asap to allow postural positioning at home to assist w/ pain control instead of inpatient  titration of pain meds    HTN BP controlled during this admission   Allergies as of 06/08/2021   No Known Allergies      Medication List     STOP taking these medications    budesonide 0.5 MG/2ML nebulizer solution Commonly known as: PULMICORT   chlorpheniramine-HYDROcodone 10-8 MG/5ML Suer Commonly known as: TUSSIONEX   ipratropium-albuterol 0.5-2.5 (3) MG/3ML Soln Commonly known as: DUONEB       TAKE these medications    albuterol 108 (90 Base) MCG/ACT inhaler Commonly known as: VENTOLIN HFA Inhale 1-2 puffs into the lungs every 6 (six) hours as needed for wheezing or shortness of breath.   amLODipine 5 MG tablet Commonly known as: NORVASC Take 5 mg by mouth daily.   baclofen 10 MG tablet Commonly known as: LIORESAL Take 10 mg by mouth 3 (three) times daily.   CALCIUM 500 PO Take 1 tablet by mouth daily.   cefdinir 300 MG capsule Commonly known as: OMNICEF Take 1 capsule (300 mg total) by mouth every 12 (twelve) hours for 4 days. Start taking on: June 09, 2021   dexamethasone 4 MG tablet Commonly known as: DECADRON Take 1 tablet (4 mg total) by mouth 3 (three) times daily.   fentaNYL 50 MCG/HR Commonly known as: [place] 1 patch onto the skin every 3 (three) days.   folic acid 1 MG tablet Commonly known as: FOLVITE Take 1 tablet (1 mg total) by mouth daily.   gabapentin 300 MG capsule Commonly known as: NEURONTIN Take 1,200  mg by mouth 3 (three) times daily.   HYDROcodone-acetaminophen 10-325 MG tablet Commonly known as: NORCO SMARTSIG:1 Tablet(s) By Mouth 4-5 Times Daily   nortriptyline 50 MG capsule Commonly known as: PAMELOR Take 50 mg by mouth at bedtime.   omeprazole 20 MG capsule Commonly known as: PRILOSEC TAKE 1 CAPSULE BY MOUTH EVERY DAY   potassium chloride SA 20 MEQ tablet Commonly known as: [place]-Con M20 Take 1 tablet (20 mEq total) by mouth 3 (three) times daily.   Premier Drainable Pouch 64MM Pouch Misc    valsartan 80 MG tablet Commonly known as: DIOVAN Take 80 mg by mouth daily.               Durable Medical Equipment  (From admission, onward)           Start     Ordered   06/08/21 1204  For home use only DME oxygen  Once       Question Answer Comment  Length of Need Lifetime   Mode or (Route) Nasal cannula   Liters per Minute 2   Frequency Continuous (stationary and portable oxygen unit needed)   Oxygen conserving device Yes   Oxygen delivery system Gas      06/08/21 1204            Day of Discharge BP 129/69 (BP Location: Right Arm)   Pulse 87   Temp 98.3 F (36.8 C) (Oral)   Resp 18   Ht 5\' 7"  (1.702 m)   Wt 85.5 kg   SpO2 92%   BMI 29.54 kg/m   Physical Exam: General: No acute respiratory distress Lungs: Clear to auscultation bilaterally without wheezes or crackles Cardiovascular: Regular rate and rhythm without murmur gallop or rub normal S1 and S2 Abdomen: Nontender, nondistended, soft, bowel sounds positive, no rebound, no ascites, no appreciable mass Extremities: No significant cyanosis, clubbing, or edema bilateral lower extremities  Basic Metabolic Panel: Recent Labs  Lab 06/06/21 1113 06/07/21 0523 06/08/21 0433  NA 135 137 137  K 3.9 4.2 3.8  CL 97* 100 100  CO2 30 31 33*  GLUCOSE 123* 140* 88  BUN 18 17 23   CREATININE 0.64 0.58 0.73  CALCIUM 8.5* 8.4* 8.1*    Liver Function Tests: Recent Labs  Lab 06/06/21 1113  AST 19  ALT 14  ALKPHOS 92  BILITOT 0.6  PROT 6.5  ALBUMIN 3.0*    CBC: Recent Labs  Lab 06/06/21 1113 06/07/21 0523 06/08/21 0433  WBC 18.9* 17.3* 15.9*  NEUTROABS 16.0* 15.8* 13.9*  HGB 13.8 12.8 12.5  HCT 40.4 38.1 37.3  MCV 92.0 91.6 94.2  PLT 273 239 233    BNP (last 3 results) Recent Labs    08/24/20 1620  BNP 29.8     Recent Results (from the past 240 hour(s))  Group A Strep by PCR ([facility] Only)     Status: None   Collection Time: 06/06/21 12:10 PM   Specimen: Throat; Sterile Swab   Result Value Ref Range Status   Group A Strep by PCR NOT DETECTED NOT DETECTED Final    Comment: Performed at [facility], [address] [place],  [address]  Resp Panel by RT-PCR (Flu A&B, Covid) Throat     Status: None   Collection Time: 06/06/21 12:10 PM   Specimen: Throat; Nasopharyngeal(NP) swabs in vial transport medium  Result Value Ref Range Status   SARS Coronavirus 2 by RT PCR NEGATIVE NEGATIVE Final    Comment: (NOTE) SARS-CoV-2  target nucleic acids are NOT DETECTED.  The SARS-CoV-2 RNA is generally detectable in upper respiratory specimens during the acute phase of infection. The lowest concentration of SARS-CoV-2 viral copies this assay can detect is 138 copies/mL. A negative result does not preclude SARS-Cov-2 infection and should not be used as the sole basis for treatment or other patient management decisions. A negative result may occur with  improper specimen collection/handling, submission of specimen other than nasopharyngeal swab, presence of viral mutation(s) within the areas targeted by this assay, and inadequate number of viral copies(<138 copies/mL). A negative result must be combined with clinical observations, patient history, and epidemiological information. The expected result is Negative.  Fact Sheet for Patients:  EntrepreneurPulse.com.au  Fact Sheet for Healthcare Providers:  IncredibleEmployment.be  This test is no t yet approved or cleared by the [place] FDA and  has been authorized for detection and/or diagnosis of SARS-CoV-2 by FDA under an Emergency Use Authorization (EUA). This EUA will remain  in effect (meaning this test can be used) for the duration of the COVID-19 declaration under Section 564(b)(1) of the Act, 21 U.S.C.section 360bbb-3(b)(1), unless the authorization is terminated  or revoked sooner.       Influenza A by PCR NEGATIVE NEGATIVE Final   Influenza B by PCR  NEGATIVE NEGATIVE Final    Comment: (NOTE) The Xpert Xpress SARS-CoV-2/FLU/RSV plus assay is intended as an aid in the diagnosis of influenza from Nasopharyngeal swab specimens and should not be used as a sole basis for treatment. Nasal washings and aspirates are unacceptable for Xpert Xpress SARS-CoV-2/FLU/RSV testing.  Fact Sheet for Patients: EntrepreneurPulse.com.au  Fact Sheet for Healthcare Providers: IncredibleEmployment.be  This test is not yet approved or cleared by the [place] FDA and has been authorized for detection and/or diagnosis of SARS-CoV-2 by FDA under an Emergency Use Authorization (EUA). This EUA will remain in effect (meaning this test can be used) for the duration of the COVID-19 declaration under Section 564(b)(1) of the Act, 21 U.S.C. section 360bbb-3(b)(1), unless the authorization is terminated or revoked.  Performed at [facility], [address] [place], [place] [address]   Culture, blood (Routine X 2) w Reflex to ID Panel     Status: None (Preliminary result)   Collection Time: 06/06/21  1:15 PM   Specimen: BLOOD  Result Value Ref Range Status   Specimen Description BLOOD LEFT ANTECUBITAL  Final   Special Requests   Final    BOTTLES DRAWN AEROBIC AND ANAEROBIC Blood Culture adequate volume   Culture   Final    NO GROWTH 2 DAYS Performed at [facility], [address], [place], [place] [address]    Report Status PENDING  Incomplete  Culture, blood (Routine X 2) w Reflex to ID Panel     Status: None (Preliminary result)   Collection Time: 06/06/21  1:15 PM   Specimen: BLOOD  Result Value Ref Range Status   Specimen Description BLOOD RIGHT ANTECUBITAL  Final   Special Requests   Final    BOTTLES DRAWN AEROBIC AND ANAEROBIC Blood Culture adequate volume   Culture   Final    NO GROWTH 2 DAYS Performed at [facility], [address] [place], [place] [address]    Report  Status PENDING  Incomplete  Urine Culture     Status: Abnormal   Collection Time: 06/06/21  1:15 PM   Specimen: Urine, Clean Catch  Result Value Ref Range Status   Specimen Description   Final  URINE, CLEAN CATCH Performed at [facility], [address], [place], [place] [address]    Special Requests   Final    NONE Performed at [facility], [address] [place], [place] [address]    Culture MULTIPLE SPECIES PRESENT, [place] (A)  Final   Report Status 06/08/2021 FINAL  Final     Time spent in discharge (includes decision making & examination of pt): 35 minutes  06/08/2021, 12:10 PM   Cherene Altes, MD [facility] Office  [phone]

## 2021-06-08 NOTE — Consult Note (Signed)
Tamara Hall NOTE  Patient Care Team: Loni Muse, MD as PCP - General (Internal Medicine) Stitzenberg, Clint Lipps, MD as Referring Physician (Surgical Oncology) Lequita Asal, MD (Inactive) as Referring Physician (Hematology and Oncology) Tyler Pita, MD as Consulting Physician (Pulmonary Disease)  CHIEF COMPLAINTS/PURPOSE OF CONSULTATION: Rectal cancer.  HISTORY OF PRESENTING ILLNESS:  Tamara Hall 65 y.o.  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  On a CAT scan in August 2022-at [facility] patient noted to have a left lower lobe mass eroding into the ribs-likely cause of her posterior chest wall pain.  Patient was recently evaluated at our clinic-recommended PET scan for further evaluation.  However patient cited social issues [ex-husband in the hospital; etc.] which delayed her work-up.  In the interim patient was evaluated at [facility] pain clinic-increase the dose of her fentanyl patch to 50 mcg on the day prior to presentation.   In the hospital CT scan showed bilateral diffuse groundglass opacities-concerning for pneumonia.  Patient is on antibiotics noted to have improvement of her breathing.  She is wanting to go home.  Review of Systems  Constitutional:  Positive for malaise/fatigue and weight loss. Negative for chills, diaphoresis and fever.  HENT:  Negative for nosebleeds and sore throat.   Eyes:  Negative for double vision.  Respiratory:  Positive for shortness of breath. Negative for cough, hemoptysis and wheezing.   Cardiovascular:  Negative for chest pain, palpitations, orthopnea and leg swelling.  Gastrointestinal:  Negative for abdominal pain, blood in stool, constipation, diarrhea, heartburn,  melena, nausea and vomiting.  Genitourinary:  Negative for dysuria, frequency and urgency.  Musculoskeletal:  Positive for back pain and joint pain.  Skin: Negative.  Negative for itching and rash.  Neurological:  Negative for dizziness, tingling, focal weakness, weakness and headaches.  Endo/Heme/Allergies:  Does not bruise/bleed easily.  Psychiatric/Behavioral:  Negative for depression. The patient is nervous/anxious and has insomnia.     MEDICAL HISTORY:  Past Medical History:  Diagnosis Date   Cancer ([place])    colon, with mets to liver and left lung   Cough    Hypertension    Pneumonia    20+ years ago    SURGICAL HISTORY: Past Surgical History:  Procedure Laterality Date   ABDOMINAL HYSTERECTOMY     BREAST SURGERY     COLON SURGERY  10/2017   [facility]   COLONOSCOPY WITH PROPOFOL N/A 01/14/2017   Procedure: COLONOSCOPY WITH PROPOFOL;  Surgeon: Lucilla Lame, MD;  Location: [facility] ENDOSCOPY;  Service: Endoscopy;  Laterality: N/A;   ELBOW SURGERY Right    Has had 4 surgeries on right elbow.   HAND SURGERY Right 2008   JOINT REPLACEMENT     LIVER BIOPSY  10/2017   lung wedge resection  10/2017   PORTA CATH INSERTION N/A 01/29/2017   Procedure: Glori Luis Cath Insertion;  Surgeon: Algernon Huxley, MD;  Location: [place] [facility];  Service: Cardiovascular;  Laterality: N/A;   TONSILLECTOMY      SOCIAL HISTORY: Social History   Socioeconomic History   Marital status: Divorced    Spouse name: Not on file   Number of children: Not on file   Years of education: Not on file   Highest education level: Not on file  Occupational History   Not on  file  Tobacco Use   Smoking status: Every Day    Packs/day: 1.50    Years: 40.00    Pack years: 60.00    Types: Cigarettes   Smokeless tobacco: Never   Tobacco comments:    2 cigs daily-05/10/2021  Vaping Use   Vaping Use: Never used  Substance and Sexual Activity   Alcohol use: No   Drug use: No   Sexual activity: Not on file  Other  Topics Concern   Not on file  Social History Narrative   Not on file   Social Determinants of Health   Financial Resource Strain: Not on file  Food Insecurity: Not on file  Transportation Needs: Not on file  Physical Activity: Not on file  Stress: Not on file  Social Connections: Not on file  Intimate Partner Violence: Not on file    FAMILY HISTORY: Family History  Problem Relation Age of Onset   Lung cancer Mother 59   Esophageal cancer Father 14   Brain cancer Maternal Grandmother     ALLERGIES:  has No Known Allergies.  MEDICATIONS:  No current facility-administered medications for this encounter.   Current Outpatient Medications  Medication Sig Dispense Refill   albuterol (VENTOLIN HFA) 108 (90 Base) MCG/ACT inhaler Inhale 1-2 puffs into the lungs every 6 (six) hours as needed for wheezing or shortness of breath.      amLODipine (NORVASC) 5 MG tablet Take 5 mg by mouth daily.     baclofen (LIORESAL) 10 MG tablet Take 10 mg by mouth 3 (three) times daily.     Calcium Carbonate (CALCIUM 500 PO) Take 1 tablet by mouth daily.     dexamethasone (DECADRON) 4 MG tablet Take 1 tablet (4 mg total) by mouth 3 (three) times daily. 45 tablet 0   fentaNYL (DURAGESIC) 50 MCG/HR Place 1 patch onto the skin every 3 (three) days. 5 patch 0   folic acid (FOLVITE) 1 MG tablet Take 1 tablet (1 mg total) by mouth daily. 90 tablet 1   gabapentin (NEURONTIN) 300 MG capsule Take 1,200 mg by mouth 3 (three) times daily.      HYDROcodone-acetaminophen (NORCO) 10-325 MG tablet SMARTSIG:1 Tablet(s) By Mouth 4-5 Times Daily     nortriptyline (PAMELOR) 50 MG capsule Take 50 mg by mouth at bedtime.     omeprazole (PRILOSEC) 20 MG capsule TAKE 1 CAPSULE BY MOUTH EVERY DAY 30 capsule 1   potassium chloride SA (KLOR-CON M20) 20 MEQ tablet Take 1 tablet (20 mEq total) by mouth 3 (three) times daily. 90 tablet 2   valsartan (DIOVAN) 80 MG tablet Take 80 mg by mouth daily.     cefdinir (OMNICEF) 300 MG  capsule Take 1 capsule (300 mg total) by mouth every 12 (twelve) hours for 4 days. 8 capsule 0   Ostomy Supplies (PREMIER DRAINABLE POUCH 64MM) Pouch MISC         .  PHYSICAL EXAMINATION:  Vitals:   06/08/21 1228 06/08/21 1352  BP: 127/66   Pulse: 76   Resp: 17   Temp: 98.3 F (36.8 C)   SpO2: 92% 92%   Filed Weights   06/06/21 1116 06/06/21 2306  Weight: 182 lb 15.7 oz (83 kg) 188 lb 9.6 oz (85.5 kg)    Physical Exam Vitals and nursing note reviewed.  HENT:     Head: Normocephalic and atraumatic.     Mouth/Throat:     Pharynx: Oropharynx is clear.  Eyes:     Extraocular Movements: Extraocular  movements intact.     Pupils: Pupils are equal, round, and reactive to light.  Cardiovascular:     Rate and Rhythm: Normal rate and regular rhythm.  Pulmonary:     Comments: Decreased breath sounds bilaterally.  Abdominal:     Palpations: Abdomen is soft.  Musculoskeletal:        General: Normal range of motion.     Cervical back: Normal range of motion.  Skin:    General: Skin is warm.  Neurological:     General: No focal deficit present.     Mental Status: She is alert and oriented to person, place, and time.  Psychiatric:        Behavior: Behavior normal.        Judgment: Judgment normal.     LABORATORY DATA:  I have reviewed the data as listed Lab Results  Component Value Date   WBC 15.9 (H) 06/08/2021   HGB 12.5 06/08/2021   HCT 37.3 06/08/2021   MCV 94.2 06/08/2021   PLT 233 06/08/2021   Recent Labs    06/13/20 1103 07/04/20 1211 08/07/20 1354 12/14/20 1311 02/08/21 1445 06/06/21 1113 06/07/21 0523 06/08/21 0433  NA 137 138   < > 137 133* 135 137 137  K 3.7 3.5   < > 3.6 3.9 3.9 4.2 3.8  CL 99 99   < > 100 98 97* 100 100  CO2 29 28   < > 25 28 30 31  33*  GLUCOSE 125* 125*   < > 101* 118* 123* 140* 88  BUN 10 10   < > 12 12 18 17 23   CREATININE 0.72 0.79   < > 0.93 0.77 0.64 0.58 0.73  CALCIUM 8.8* 8.8*   < > 9.1 9.2 8.5* 8.4* 8.1*  GFRNONAA  >60 >60   < > >60 >60 >60 >60 >60  GFRAA >60 >60  --   --   --   --   --   --   PROT  --   --    < > 7.3 7.3 6.5  --   --   ALBUMIN  --   --    < > 3.9 3.8 3.0*  --   --   AST  --   --    < > 20 16 19   --   --   ALT  --   --    < > 14 11 14   --   --   ALKPHOS  --   --    < > 79 89 92  --   --   BILITOT  --   --    < > 0.6 0.2* 0.6  --   --    < > = values in this interval not displayed.    RADIOGRAPHIC STUDIES: I have personally reviewed the radiological images as listed and agreed with the findings in the report. DG Chest 2 View  Result Date: 06/06/2021 CLINICAL DATA:  Shortness of breath.  History of lung cancer EXAM: CHEST - 2 VIEW COMPARISON:  08/24/2020, 10/31/2020 FINDINGS: Stable positioning of a right-sided chest port. Heart size within normal limits. Aortic atherosclerosis. Diffuse interstitial prominence bilaterally. Patchy opacities within the right mid to lower lung. Additional more focal opacity in the periphery of the left mid lung. No pleural effusion. No pneumothorax. IMPRESSION: Patchy opacities within the right mid to lower lung and periphery of the left mid lung, as described. Findings may reflect multifocal infection or edema. Progression  of known malignancy is not excluded. Electronically Signed   By: Davina Poke D.O.   On: 06/06/2021 12:39   CT Angio Chest PE W and/or Wo Contrast  Result Date: 06/06/2021 CLINICAL DATA:  Shortness of breath and generalized chest pain EXAM: CT ANGIOGRAPHY CHEST WITH CONTRAST TECHNIQUE: Multidetector CT imaging of the chest was performed using the standard protocol during bolus administration of intravenous contrast. Multiplanar CT image reconstructions and MIPs were obtained to evaluate the vascular anatomy. CONTRAST:  69mL OMNIPAQUE IOHEXOL 350 MG/ML SOLN COMPARISON:  Chest x-ray from earlier in the same day, CT from 10/31/2020 FINDINGS: Cardiovascular: Atherosclerotic calcifications are noted without aneurysmal dilatation or dissection.  No cardiac enlargement is noted. Mild coronary calcifications are seen. The pulmonary artery shows a normal branching pattern without discrete filling defect to suggest pulmonary embolism. The lower lobe branches are somewhat limited in evaluation to patient motion artifact. Right chest wall port is noted. Mediastinum/Nodes: Thoracic inlet is within normal limits. Multiple hilar and mediastinal calcified lymph nodes are noted consistent with prior granulomatous disease. No sizable adenopathy is seen. The esophagus as visualized is within normal limits. Lungs/Pleura: Lungs are well aerated bilaterally although patchy ground-glass airspace opacity is noted throughout both lungs consistent with multifocal pneumonia. More focal consolidation is noted in the left lower lobe. Scattered calcified granulomas are seen. Upper Abdomen: Visualized upper abdomen demonstrates a stable hypodensity within the medial segment of the left lobe of the liver consistent with prior ablation. Multiple splenic and hepatic granulomas are seen. No other focal abnormality is noted. Musculoskeletal: Degenerative changes of thoracic spine are seen. No rib abnormality is noted. Review of the MIP images confirms the above findings. IMPRESSION: No evidence of pulmonary emboli. Motion artifact somewhat limits evaluation of the lower lobe branches. Diffuse bilateral ground-glass opacities consistent with multifocal pneumonia. More marked consolidation is noted in the left lower lobe. Stable post ablation defect within the liver. Changes of prior granulomatous disease. Aortic Atherosclerosis (ICD10-I70.0). Electronically Signed   By: Inez Catalina M.D.   On: 06/06/2021 15:13    Rectal cancer [facility]) # 65 year old female patient with Stage IV rectal carcinoma-currently on surveillance; suspected recurrence in the left posterior chest wall; and also chronic pain medication-admitted to hospital for pneumonia  #Stage IV rectal carcinoma with lung/liver  metastases [s/p wedge resection; declined lobectomy s/p SBRT-Lung; Liver-ablation]-suspected recurrence left posterior lobe 2.4 cm invading into chest wall/bone.  Recommend PET scan ASAP.  Patient also has an appointment at [facility].  Patient states that she is currently weighing her options-whether to get treatment locally versus going to [facility].  We will be happy to facilitate the patient's wishes.  #Pain control: Adequately controlled at this time on hydrocodone.  Unlikely patient's fentanyl patch causing respiratory issues [as reported by patient].  At this time I would defer pain management with [facility] pain clinic.  S/p evaluation with palliative care today.  #Bilateral pneumonia-non-COVID; improving on antibiotics.   # Thank you for allowing me to participate in the care of your pleasant patient. Please do not hesitate to contact me with questions or concerns in the interim.  The plan of care was discussed with Dr. Thereasa Solo; and also discussed with [facility].  We will plan to follow-up few days after the PET scan to discuss treatment options.  All questions were answered. The patient knows to call the clinic with any problems, questions or concerns.    Cammie Sickle, MD 06/09/2021 6:38 PM

## 2021-06-09 ENCOUNTER — Telehealth: Payer: Self-pay | Admitting: Internal Medicine

## 2021-06-09 NOTE — Assessment & Plan Note (Addendum)
#   65 year old female patient with Stage IV rectal carcinoma-currently on surveillance; suspected recurrence in the left posterior chest wall; and also chronic pain medication-admitted to hospital for pneumonia  #Stage IV rectal carcinoma with lung/liver metastases [s/p wedge resection; declined lobectomy s/p SBRT-Lung; Liver-ablation]-suspected recurrence left posterior lobe 2.4 cm invading into chest wall/bone.  Recommend PET scan ASAP.  Patient also has an appointment at [facility].  Patient states that she is currently weighing her options-whether to get treatment locally versus going to [facility].  We will be happy to facilitate the patient's wishes.  #Pain control: Adequately controlled at this time on hydrocodone.  Unlikely patient's fentanyl patch causing respiratory issues [as reported by patient].  At this time I would defer pain management with [facility] pain clinic.  S/p evaluation with palliative care today.  #Bilateral pneumonia-non-COVID; improving on antibiotics.   # Thank you for allowing me to participate in the care of your pleasant patient. Please do not hesitate to contact me with questions or concerns in the interim.  The plan of care was discussed with Dr. Thereasa Solo; and also discussed with [facility].  We will plan to follow-up few days after the PET scan to discuss treatment options.

## 2021-06-09 NOTE — Telephone Encounter (Signed)
H/T-please schedule PET scan 10 to 14 days; follow-up with me 1 to 2 days later.  No labs.  Also appointment with Dr. Ulyses Amor recurrent rectal cancer.  Also appointment with Josh on the same day.  GB

## 2021-06-11 LAB — CULTURE, BLOOD (ROUTINE X 2)
Culture: NO GROWTH
Culture: NO GROWTH
Special Requests: ADEQUATE
Special Requests: ADEQUATE

## 2021-06-12 ENCOUNTER — Other Ambulatory Visit: Payer: Self-pay | Admitting: Hospice and Palliative Medicine

## 2021-06-12 MED ORDER — HYDROCOD POLST-CPM POLST ER 10-8 MG/5ML PO SUER: 5.0000 mL | Freq: Every evening | ORAL | 0 refills | Status: DC | PRN

## 2021-06-12 NOTE — Progress Notes (Signed)
Patient requested refill of Tussionex.  Will send Rx to pharmacy.

## 2021-06-26 ENCOUNTER — Ambulatory Visit
Admission: RE | Admit: 2021-06-26 | Discharge: 2021-06-26 | Disposition: A | Discharge status: Home / Self Care | Payer: Medicare HMO | Source: Ambulatory Visit | Attending: Internal Medicine | Admitting: Internal Medicine

## 2021-06-26 ENCOUNTER — Other Ambulatory Visit: Payer: Self-pay

## 2021-06-26 DIAGNOSIS — I719 Aortic aneurysm of unspecified site, without rupture: Secondary | ICD-10-CM | POA: Insufficient documentation

## 2021-06-26 DIAGNOSIS — I700 Atherosclerosis of aorta: Secondary | ICD-10-CM | POA: Insufficient documentation

## 2021-06-26 DIAGNOSIS — C20 Malignant neoplasm of rectum: Secondary | ICD-10-CM | POA: Insufficient documentation

## 2021-06-26 LAB — GLUCOSE, CAPILLARY: Glucose-Capillary: 91 mg/dL (ref 70–99)

## 2021-06-26 MED ORDER — FLUDEOXYGLUCOSE F - 18 (FDG) INJECTION
9.7000 | Freq: Once | INTRAVENOUS | Status: AC | PRN
  Administered 2021-06-26: 10.42 via INTRAVENOUS

## 2021-06-28 ENCOUNTER — Inpatient Hospital Stay (HOSPITAL_BASED_OUTPATIENT_CLINIC_OR_DEPARTMENT_OTHER): Payer: Medicare HMO | Admitting: Hospice and Palliative Medicine

## 2021-06-28 ENCOUNTER — Ambulatory Visit
Admission: RE | Admit: 2021-06-28 | Discharge: 2021-06-28 | Disposition: A | Discharge status: Home / Self Care | Payer: Medicare HMO | Source: Ambulatory Visit | Attending: Radiation Oncology | Admitting: Radiation Oncology

## 2021-06-28 ENCOUNTER — Encounter: Payer: Self-pay | Admitting: Hematology and Oncology

## 2021-06-28 ENCOUNTER — Other Ambulatory Visit: Payer: Self-pay

## 2021-06-28 ENCOUNTER — Encounter: Payer: Self-pay | Admitting: Radiation Oncology

## 2021-06-28 ENCOUNTER — Encounter: Payer: Self-pay | Admitting: Internal Medicine

## 2021-06-28 ENCOUNTER — Inpatient Hospital Stay: Payer: Medicare HMO | Attending: Internal Medicine | Admitting: Internal Medicine

## 2021-06-28 ENCOUNTER — Telehealth: Payer: Self-pay | Admitting: Nurse Practitioner

## 2021-06-28 VITALS — BP 124/63 | HR 98 | Temp 97.1°F | Resp 18 | Wt 179.3 lb

## 2021-06-28 DIAGNOSIS — C787 Secondary malignant neoplasm of liver and intrahepatic bile duct: Secondary | ICD-10-CM | POA: Diagnosis not present

## 2021-06-28 DIAGNOSIS — C7802 Secondary malignant neoplasm of left lung: Secondary | ICD-10-CM | POA: Diagnosis not present

## 2021-06-28 DIAGNOSIS — C7800 Secondary malignant neoplasm of unspecified lung: Secondary | ICD-10-CM | POA: Insufficient documentation

## 2021-06-28 DIAGNOSIS — C7951 Secondary malignant neoplasm of bone: Secondary | ICD-10-CM | POA: Diagnosis present

## 2021-06-28 DIAGNOSIS — C7989 Secondary malignant neoplasm of other specified sites: Secondary | ICD-10-CM | POA: Diagnosis not present

## 2021-06-28 DIAGNOSIS — C20 Malignant neoplasm of rectum: Secondary | ICD-10-CM

## 2021-06-28 DIAGNOSIS — Z923 Personal history of irradiation: Secondary | ICD-10-CM | POA: Diagnosis not present

## 2021-06-28 DIAGNOSIS — F17210 Nicotine dependence, cigarettes, uncomplicated: Secondary | ICD-10-CM | POA: Diagnosis not present

## 2021-06-28 NOTE — Assessment & Plan Note (Addendum)
Stage IV rectal carcinoma: with lung/liver metastases [s/p wedge resection; declined lobectomy s/p SBRT-Lung; Liver-ablation]-CT scan- SEP 2022-Signs of chest wall metastatic disease associated with pathologicfracture of LEFT eighth rib and rib destruction with no evidence of metastatic disease in right lung.  SEP 2022- consolidative changes in the LEFT lung base progressing over time potentially related, likely related to post treatment changes in this area at the site of previous nodule.  PET avid left posterior chest wall lesion; no evidence of this metastatic disease.  #Plan proceeding with radiation as planned with Dr. Donella Stade.  Simulation next week; and then SBRT week of Oct 3rd.    Discussed with the patient that although PET scan shows local disease to the left chest wall-she might have micrometastatic disease-this might resurface down the line.  Patient understand that she has stage IV cancer which unfortunately incurable.  However for now hold off any systemic therapy.  We will consider imaging in about 3 months or so to evaluate response from radiation.  Also await evaluation with [facility] oncology.   #Pain control: [Dr.Chigdy; [facility]-pain clinic]-hydrocodone 10 every 5-6 hours [as per patient; for her back pain]; currently with left posterior chest wall metastatic lesion.  Currently stable.  Also s/p evaluation with [facility] Borders; palliative care today.  Discussed with Josh.  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  Patient awaiting vascular evaluation at [facility].   # port flush every 2 to 3 months/next visit   # DISPOSITION: # follow up- MD; labs- cbc/cmp; 4th week of October; port flush- Dr.B  # I reviewed the blood work- with the patient in detail; also reviewed the imaging independently [as summarized above]; and with the patient in detail.   # 40 minutes face-to-face with the patient  discussing the above plan of care; more than 50% of time spent on prognosis/ natural history; counseling and coordination.

## 2021-06-28 NOTE — Progress Notes (Signed)
Radiation Oncology Follow up Note old patient new area rib metastasis  Name: Tamara Hall   Date:   06/28/2021 MRN:  [id number] DOB: [birth year]    This 65 y.o. female presents to the clinic today for 1 year follow-up status post radiation therapy to her left lung for metastatic disease and patient with known stage IV colorectal cancer.Marland Kitchen  REFERRING PROVIDER: Loni Muse, MD  HPI: Patient is a 65 year old female now out 1 year having received palliative radiation therapy to her left lung for metastatic involvement of stage IV colorectal cancer.  She is also had microwave ablation of her liver lesions back in 2018.Marland Kitchen  She has been having some significant left rib pain recently PET CT scan showed metastatic disease associate with path pathologic fracture of the left eighth rib and rib destruction as well as metastatic disease which is minimal though in the right chest and the right middle lobe.  She has consolidative chest changes in the left lung base likely related to posttreatment changes.  She is currently on narcotic analgesics for her pain.  COMPLICATIONS OF TREATMENT: none  FOLLOW UP COMPLIANCE: keeps appointments   PHYSICAL EXAM:  BP 124/63 (BP Location: Right Arm, Patient Position: Sitting, Cuff Size: Normal)   Pulse 98   Temp (!) 97.1 F (36.2 C) (Tympanic)   Resp 18   Wt 179 lb 4.8 oz (81.3 kg)   SpO2 90%   BMI 28.08 kg/m  Well-developed well-nourished patient in NAD. HEENT reveals PERLA, EOMI, discs not visualized.  Oral cavity is clear. No oral mucosal lesions are identified. Neck is clear without evidence of cervical or supraclavicular adenopathy. Lungs are clear to A&P. Cardiac examination is essentially unremarkable with regular rate and rhythm without murmur rub or thrill. Abdomen is benign with no organomegaly or masses noted. Motor sensory and DTR levels are equal and symmetric in the upper and lower extremities. Cranial nerves II through XII are grossly intact.  Proprioception is intact. No peripheral adenopathy or edema is identified. No motor or sensory levels are noted. Crude visual fields are within normal range.  RADIOLOGY RESULTS: PET CT scan reviewed compatible with above-stated findings  PLAN: At the present time of offered single fraction 800 centigrade to her left rib metastasis.  I believe this may buy her some significant palliation of pain in this region.  Risks and benefits of treatment occluding skin reaction fatigue possible some slight lung changes in the periphery all were discussed in detail with the patient.  She seems to comprehend my recommendations well.  I have personally set up and ordered CT simulation for next week.  I would like to take this opportunity to thank you for allowing me to participate in the care of your patient.Noreene Filbert, MD

## 2021-06-28 NOTE — Progress Notes (Signed)
[place]  [phone] [phone]   Name: Tamara Hall Date: 06/28/2021 MRN: [id number]  DOB: November 23, 1955  Patient Care Team: Loni Muse, MD as PCP - General (Internal Medicine) Stitzenberg, Clint Lipps, MD as Referring Physician (Surgical Oncology) Lequita Asal, MD (Inactive) as Referring Physician (Hematology and Oncology) Tyler Pita, MD as Consulting Physician (Pulmonary Disease)    REASON FOR CONSULTATION: Tamara Hall is a 65 y.o. 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  Palliative care was consulted to address goals.   SOCIAL HISTORY:     reports that she has been smoking cigarettes. She has a 60.00 pack-year smoking history. She has never used smokeless tobacco. She reports that she does not drink alcohol and does not use drugs.  ADVANCE DIRECTIVES:  None on file  CODE STATUS:   PAST MEDICAL HISTORY: Past Medical History:  Diagnosis Date   Cancer ([place])    colon, with mets to liver and left lung   Cough    Hypertension    Pneumonia    20+ years ago    PAST SURGICAL HISTORY:  Past Surgical History:  Procedure Laterality Date   ABDOMINAL HYSTERECTOMY     BREAST SURGERY     COLON SURGERY  10/2017   [facility]   COLONOSCOPY WITH PROPOFOL N/A 01/14/2017   Procedure: COLONOSCOPY WITH PROPOFOL;  Surgeon: Lucilla Lame, MD;  Location: [facility] ENDOSCOPY;  Service: Endoscopy;  Laterality: N/A;   ELBOW SURGERY Right    Has had 4 surgeries on right elbow.   HAND SURGERY Right 2008   JOINT REPLACEMENT     LIVER BIOPSY  10/2017   lung wedge resection  10/2017   PORTA CATH INSERTION N/A 01/29/2017   Procedure: Glori Luis Cath Insertion;  Surgeon: Algernon Huxley, MD;  Location: [facility];  Service: Cardiovascular;   Laterality: N/A;   TONSILLECTOMY      HEMATOLOGY/ONCOLOGY HISTORY:  Oncology History Overview Note  She received 6 cycles of FOLFOX (02/18/2017 - 04/22/2017).               She underwent CT guided microwave ablation of the liver lesion on 06/06/2017.               She received radiation and daily Xeloda (06/18/2017- 08/15/2017).             She underwent APR with descending colostomy and VRAM flap on 10/24/2017.              She underwent a VATS wedge resection of her left lung.             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                         She declined left lower lobe lobectomy.                         She received 1 cycle of FOLFIRI (03/08/2020).                           She declined further chemotherapy or surgery.             She completed SBRT to  the left infrahilar nodule on 05/22/2020.             CEA was 40.9 on 06/13/2020 and 9.5 on 10/31/2020.              Chest, abdomen, and pelvis CT on 10/31/2020 was personally reviewed.  Agree with radiology findings.   Rectal cancer ([place])  01/13/2017 Initial Diagnosis   Rectal cancer ([place])   03/08/2020 -  Chemotherapy   The patient had dexamethasone (DECADRON) 4 MG tablet, 8 mg, Oral, Daily, 1 of 1 cycle, Start date: 03/08/2020, End date: 06/28/2020 palonosetron (ALOXI) injection 0.25 mg, 0.25 mg, Intravenous,  Once, 1 of 4 cycles Administration: 0.25 mg (03/08/2020) pegfilgrastim-jmdb (FULPHILA) injection 6 mg, 6 mg, Subcutaneous,  Once, 1 of 4 cycles Administration: 6 mg (03/10/2020) irinotecan (CAMPTOSAR) 360 mg in sodium chloride 0.9 % 500 mL chemo infusion, 180 mg/m2 = 360 mg, Intravenous,  Once, 1 of 4 cycles Administration: 360 mg (03/08/2020) fluorouracil (ADRUCIL) chemo injection 800 mg, 400 mg/m2 = 800 mg, Intravenous,  Once, 1 of 4 cycles Administration: 800 mg (03/08/2020) fluorouracil (ADRUCIL) 5,000 mg in sodium chloride 0.9 % 150 mL chemo infusion, 2,475 mg/m2 = 4,850 mg,  Intravenous, 1 Day/Dose, 1 of 4 cycles Administration: 5,000 mg (03/08/2020) bevacizumab-bvzr (ZIRABEV) 400 mg in sodium chloride 0.9 % 100 mL chemo infusion, 5 mg/kg = 400 mg, Intravenous,  Once, 0 of 3 cycles leucovorin 812 mg in sodium chloride 0.9 % 250 mL infusion, 400 mg/m2 = 812 mg, Intravenous,  Once, 1 of 4 cycles Administration: 812 mg (03/08/2020)   for chemotherapy treatment.       ALLERGIES:  has No Known Allergies.  MEDICATIONS:  Current Outpatient Medications  Medication Sig Dispense Refill   albuterol (VENTOLIN HFA) 108 (90 Base) MCG/ACT inhaler Inhale 1-2 puffs into the lungs every 6 (six) hours as needed for wheezing or shortness of breath.      amLODipine (NORVASC) 5 MG tablet Take 5 mg by mouth daily.     baclofen (LIORESAL) 10 MG tablet Take 10 mg by mouth 3 (three) times daily.     Calcium Carbonate (CALCIUM 500 PO) Take 1 tablet by mouth daily.     chlorpheniramine-HYDROcodone (TUSSIONEX) 10-8 MG/5ML SUER Take 5 mLs by mouth at bedtime as needed for cough. 350 mL 0   folic acid (FOLVITE) 1 MG tablet Take 1 tablet (1 mg total) by mouth daily. 90 tablet 1   gabapentin (NEURONTIN) 300 MG capsule Take 1,200 mg by mouth 3 (three) times daily.      HYDROcodone-acetaminophen (NORCO) 10-325 MG tablet      nortriptyline (PAMELOR) 50 MG capsule Take 50 mg by mouth at bedtime.     omeprazole (PRILOSEC) 20 MG capsule TAKE 1 CAPSULE BY MOUTH EVERY DAY 30 capsule 1   Ostomy Supplies (PREMIER DRAINABLE POUCH 64MM) Pouch MISC      potassium chloride SA (KLOR-CON M20) 20 MEQ tablet Take 1 tablet (20 mEq total) by mouth 3 (three) times daily. 90 tablet 2   valsartan (DIOVAN) 80 MG tablet Take 80 mg by mouth daily.     No current facility-administered medications for this visit.    VITAL SIGNS: There were no vitals taken for this visit. There were no vitals filed for this visit.  Estimated body mass index is 27.88 kg/m as calculated from the following:   Height as of an earlier  encounter on 06/28/21: 5\' 7"  (1.702 m).   Weight as of an earlier encounter on 06/28/21: 178  lb (80.7 kg).  LABS: CBC:    Component Value Date/Time   WBC 15.9 (H) 06/08/2021 0433   HGB 12.5 06/08/2021 0433   HCT 37.3 06/08/2021 0433   PLT 233 06/08/2021 0433   MCV 94.2 06/08/2021 0433   NEUTROABS 13.9 (H) 06/08/2021 0433   LYMPHSABS 0.8 06/08/2021 0433   MONOABS 0.8 06/08/2021 0433   EOSABS 0.0 06/08/2021 0433   BASOSABS 0.0 06/08/2021 0433   Comprehensive Metabolic Panel:    Component Value Date/Time   NA 137 06/08/2021 0433   K 3.8 06/08/2021 0433   CL 100 06/08/2021 0433   CO2 33 (H) 06/08/2021 0433   BUN 23 06/08/2021 0433   CREATININE 0.73 06/08/2021 0433   GLUCOSE 88 06/08/2021 0433   CALCIUM 8.1 (L) 06/08/2021 0433   AST 19 06/06/2021 1113   ALT 14 06/06/2021 1113   ALKPHOS 92 06/06/2021 1113   BILITOT 0.6 06/06/2021 1113   PROT 6.5 06/06/2021 1113   ALBUMIN 3.0 (L) 06/06/2021 1113    RADIOGRAPHIC STUDIES: DG Chest 2 View  Result Date: 06/06/2021 CLINICAL DATA:  Shortness of breath.  History of lung cancer EXAM: CHEST - 2 VIEW COMPARISON:  08/24/2020, 10/31/2020 FINDINGS: Stable positioning of a right-sided chest port. Heart size within normal limits. Aortic atherosclerosis. Diffuse interstitial prominence bilaterally. Patchy opacities within the right mid to lower lung. Additional more focal opacity in the periphery of the left mid lung. No pleural effusion. No pneumothorax. IMPRESSION: Patchy opacities within the right mid to lower lung and periphery of the left mid lung, as described. Findings may reflect multifocal infection or edema. Progression of known malignancy is not excluded. Electronically Signed   By: Davina Poke D.O.   On: 06/06/2021 12:39   CT Angio Chest PE W and/or Wo Contrast  Result Date: 06/06/2021 CLINICAL DATA:  Shortness of breath and generalized chest pain EXAM: CT ANGIOGRAPHY CHEST WITH CONTRAST TECHNIQUE: Multidetector CT imaging of the  chest was performed using the standard protocol during bolus administration of intravenous contrast. Multiplanar CT image reconstructions and MIPs were obtained to evaluate the vascular anatomy. CONTRAST:  69mL OMNIPAQUE IOHEXOL 350 MG/ML SOLN COMPARISON:  Chest x-ray from earlier in the same day, CT from 10/31/2020 FINDINGS: Cardiovascular: Atherosclerotic calcifications are noted without aneurysmal dilatation or dissection. No cardiac enlargement is noted. Mild coronary calcifications are seen. The pulmonary artery shows a normal branching pattern without discrete filling defect to suggest pulmonary embolism. The lower lobe branches are somewhat limited in evaluation to patient motion artifact. Right chest wall port is noted. Mediastinum/Nodes: Thoracic inlet is within normal limits. Multiple hilar and mediastinal calcified lymph nodes are noted consistent with prior granulomatous disease. No sizable adenopathy is seen. The esophagus as visualized is within normal limits. Lungs/Pleura: Lungs are well aerated bilaterally although patchy ground-glass airspace opacity is noted throughout both lungs consistent with multifocal pneumonia. More focal consolidation is noted in the left lower lobe. Scattered calcified granulomas are seen. Upper Abdomen: Visualized upper abdomen demonstrates a stable hypodensity within the medial segment of the left lobe of the liver consistent with prior ablation. Multiple splenic and hepatic granulomas are seen. No other focal abnormality is noted. Musculoskeletal: Degenerative changes of thoracic spine are seen. No rib abnormality is noted. Review of the MIP images confirms the above findings. IMPRESSION: No evidence of pulmonary emboli. Motion artifact somewhat limits evaluation of the lower lobe branches. Diffuse bilateral ground-glass opacities consistent with multifocal pneumonia. More marked consolidation is noted in the left lower lobe. Stable  post ablation defect within the liver.  Changes of prior granulomatous disease. Aortic Atherosclerosis (ICD10-I70.0). Electronically Signed   By: Inez Catalina M.D.   On: 06/06/2021 15:13   NM PET Image Restag (PS) Skull Base To Thigh  Result Date: 06/26/2021 CLINICAL DATA:  Subsequent treatment strategy for colorectal cancer surveillance in a 65 year old female. EXAM: NUCLEAR MEDICINE PET SKULL BASE TO THIGH TECHNIQUE: 10.42 mCi F-18 FDG was injected intravenously. Full-ring PET imaging was performed from the skull base to thigh after the radiotracer. CT data was obtained and used for attenuation correction and anatomic localization. Fasting blood glucose: 91 mg/dl COMPARISON:  Recent CT of the chest and prior CT images and PET exams dating back to 2021. FINDINGS: Mediastinal blood pool activity: SUV max 1.90 Liver activity: SUV max NA NECK: No hypermetabolic lymph nodes in the neck. Incidental CT findings: none CHEST: LEFT chest wall metastatic lesion is new since January of 2022 and associated with increased metabolic activity and rib destruction involving the LEFT posterolateral eighth rib (image 109/3) this measures approximately 2.1 x 2.7 cm and demonstrates a maximum SUV of 8.88. No hypermetabolic lymph nodes in the chest. Small hypermetabolic pulmonary nodule in the RIGHT middle lobe (image 110/3) maximum SUV of 3.34 measuring 9 mm greatest axial dimension. Pleural and parenchymal scarring in the chest. RIGHT infrahilar volume loss has worsened since November of 2021 showing further progression over a series of prior studies but is not associated with focal hypermetabolic features. Signs of prior granulomatous disease in the RIGHT hilum and subcarinal region and stable small partially calcified nodule in the RIGHT lower lobe. Incidental CT findings: RIGHT-sided Port-A-Cath terminates in the RIGHT atrium. Small amount of pericardial fluid is similar to the recent chest CT. Normal caliber central pulmonary vasculature. Limited assessment of  cardiovascular structures given lack of intravenous contrast. ABDOMEN/PELVIS: No signs of solid organ metastatic disease. Post APR. Mild increased metabolic activity overlying the sacrum with features more suggestive of post treatment changes little change compared to previous imaging no hypermetabolic lymph nodes in the retroperitoneum/abdomen or in the pelvis. Incidental CT findings: Ablation defect in the RIGHT hemi liver on the boundary of RIGHT and LEFT hepatic lobes 2.8 x 2.1 cm previously 3.1 x 2.3 cm in May of 2021. No focal hypermetabolic activity in this area. No pericholecystic stranding. Pancreas with signs of atrophy. Splenic granulomatous changes. No acute findings relative to the kidneys or adrenal glands. The urinary bladder falls into the pelvis following APR without signs of adjacent inflammation. Infrarenal abdominal aortic aneurysm measuring 3.9 x 3.8 cm previously measured approximately 3.5 x 3.6 cm in February of 2021 and is within 1 mm of its size in January 2022. Aortic atherosclerosis as before. No acute gastrointestinal process with parastomal herniation of redundant colon and fat into the LEFT lower quadrant ostomy defect. No perienteric stranding. Signs of fat necrosis above the urinary bladder and adjacent to small bowel loops in the pelvis. SKELETON: Pathologic fracture of LEFT posterior eighth rib associated with soft tissue as described. No additional signs of bony metastatic disease Incidental CT findings: none IMPRESSION: Signs of chest wall metastatic disease associated with pathologic fracture of LEFT eighth rib and rib destruction as well as metastatic disease to the RIGHT chest in the RIGHT middle lobe. Consolidative changes in the LEFT lung base progressing over time potentially related, likely related to post treatment changes in this area at the site of previous nodule. Treated disease in the liver without current evidence of increased metabolic activity  and diminished in  size compared to more remote studies. Post treatment changes about the pelvis following APR. Infrarenal abdominal aortic aneurysm unchanged compared to recent imaging, slightly enlarged compared to studies from 2021. Recommend follow-up every 2 years. This recommendation follows ACR consensus guidelines: White Paper of the ACR Incidental Findings Committee II on Vascular Findings. J Am Coll Radiol 2013; 10:789-794. Aortic Atherosclerosis (ICD10-I70.0). Aortic aneurysm NOS (ICD10-I71.9). Electronically Signed   By: Zetta Bills M.D.   On: 06/26/2021 15:15    PERFORMANCE STATUS (ECOG) : 1 - Symptomatic but completely ambulatory  Review of Systems Unless otherwise noted, a complete review of systems is negative.  Physical Exam General: NAD Pulmonary: Unlabored Extremities: no edema, no joint deformities Skin: no rashes Neurological: Weakness but otherwise nonfocal  IMPRESSION: Post hospital follow-up.  Patient reports that she is doing better since discharging home from the hospital.  She continues to have shortness of breath but finds this is improved when she wears oxygen.  She has occasional cough and has not found much relief on Tussionex.  Patient says that she has follow-up at [facility] on 10/4 to decide whether to transfer care or pursue cancer treatment locally.  Note the patient is also followed by [facility] pain management.  Patient says that she will let us know of her decision.  Patient says that her husband is currently hospitalized at [facility] with plan to transfer to the [facility] for end-of-life care.  Emotional support provided.  PLAN: -Patient is pending appointment at [facility] and will decide on whether she is transferring care -Referral to community palliative care -Follow-up TBD   Patient expressed understanding and was in agreement with this plan. She also understands that She can call the clinic at any time with any questions, concerns, or complaints.     Time Total: 20  Visit  consisted of counseling and education dealing with the complex and emotionally intense issues of symptom management and palliative care in the setting of serious and potentially life-threatening illness.Greater than 50%  of this time was spent counseling and coordinating care related to the above assessment and plan.  Signed by: Altha Harm, PhD, NP-C

## 2021-06-28 NOTE — Telephone Encounter (Signed)
Spoke with patient regarding the Palliative referral and she immediately said that she was not interested in our services at this time.  Told patient that I would cancel the referral and notify Billey Chang, NP at the [facility].

## 2021-06-28 NOTE — Progress Notes (Signed)
[place] NOTE  Patient Care Team: Loni Muse, MD as PCP - General (Internal Medicine) Stitzenberg, Clint Lipps, MD as Referring Physician (Surgical Oncology) Lequita Asal, MD (Inactive) as Referring Physician (Hematology and Oncology) Tyler Pita, MD as Consulting Physician (Pulmonary Disease)  CHIEF COMPLAINTS/PURPOSE OF CONSULTATION: Rectal cancer metastatic  #  Oncology History Overview Note  She received 6 cycles of FOLFOX (02/18/2017 - 04/22/2017).               She underwent CT guided microwave ablation of the liver lesion on 06/06/2017.               She received radiation and daily Xeloda (06/18/2017- 08/15/2017).             She underwent APR with descending colostomy and VRAM flap on 10/24/2017.              She underwent a VATS wedge resection of her left lung.             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                         She declined left lower lobe lobectomy.                         She received 1 cycle of FOLFIRI (03/08/2020).                           She declined further chemotherapy or surgery.             She completed SBRT to the left infrahilar nodule on 05/22/2020.             CEA was 40.9 on 06/13/2020 and 9.5 on 10/31/2020.              Chest, abdomen, and pelvis CT on 10/31/2020 was personally reviewed.  Agree with radiology findings.   Rectal cancer ([place])  01/13/2017 Initial Diagnosis   Rectal cancer ([place])   03/08/2020 -  Chemotherapy   The patient had dexamethasone (DECADRON) 4 MG tablet, 8 mg, Oral, Daily, 1 of 1 cycle, Start date: 03/08/2020, End date: 06/28/2020 palonosetron (ALOXI) injection 0.25 mg, 0.25 mg, Intravenous,  Once, 1 of 4 cycles Administration: 0.25 mg (03/08/2020) pegfilgrastim-jmdb (FULPHILA) injection 6 mg, 6 mg, Subcutaneous,  Once, 1 of 4 cycles Administration: 6 mg (03/10/2020) irinotecan (CAMPTOSAR) 360 mg in sodium chloride 0.9 % 500 mL chemo  infusion, 180 mg/m2 = 360 mg, Intravenous,  Once, 1 of 4 cycles Administration: 360 mg (03/08/2020) fluorouracil (ADRUCIL) chemo injection 800 mg, 400 mg/m2 = 800 mg, Intravenous,  Once, 1 of 4 cycles Administration: 800 mg (03/08/2020) fluorouracil (ADRUCIL) 5,000 mg in sodium chloride 0.9 % 150 mL chemo infusion, 2,475 mg/m2 = 4,850 mg, Intravenous, 1 Day/Dose, 1 of 4 cycles Administration: 5,000 mg (03/08/2020) bevacizumab-bvzr (ZIRABEV) 400 mg in sodium chloride 0.9 % 100 mL chemo infusion, 5 mg/kg = 400 mg, Intravenous,  Once, 0 of 3 cycles leucovorin 812 mg in sodium chloride 0.9 % 250 mL infusion, 400 mg/m2 = 812 mg, Intravenous,  Once, 1 of 4 cycles Administration: 812 mg (03/08/2020)   for chemotherapy treatment.        HISTORY OF PRESENTING ILLNESS: Patient alone.  Walks with a rolling walker. [name] 65 y.o.  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  Patient Noted to have a left posterior chest wall lesion-on imaging in August with [facility].  In the interim patient was admitted to [facility] for a community-acquired pneumonia-currently s/p antibiotics.  Patient shortness of breath cough improved.  Her left posterior chest wall pain is improved-she continues plan narcotic pain medication as per [facility] pain clinic.  Patient denies any blood in stools or black or stool.  No nausea or vomiting.   Patient was evaluated by Dr. Donella Stade this morning for radiation purposes.  Patient's husband is currently being discharged with hospice; she is quite emotional.  Review of Systems  Constitutional:  Positive for malaise/fatigue. Negative for chills, diaphoresis, fever and weight loss.       Left posterior chest wall pain.  HENT:  Negative for nosebleeds and sore throat.   Eyes:  Negative for double vision.  Respiratory:  Negative for cough, hemoptysis, sputum production, shortness of  breath and wheezing.   Cardiovascular:  Negative for chest pain, palpitations, orthopnea and leg swelling.  Gastrointestinal:  Negative for abdominal pain, blood in stool, constipation, diarrhea, heartburn, melena, nausea and vomiting.  Genitourinary:  Negative for dysuria, frequency and urgency.  Musculoskeletal:  Positive for back pain and joint pain.  Skin: Negative.  Negative for itching and rash.  Neurological:  Negative for dizziness, tingling, focal weakness, weakness and headaches.  Endo/Heme/Allergies:  Does not bruise/bleed easily.  Psychiatric/Behavioral:  Negative for depression. The patient is not nervous/anxious and does not have insomnia.     MEDICAL HISTORY:  Past Medical History:  Diagnosis Date   Cancer ([place])    colon, with mets to liver and left lung   Cough    Hypertension    Pneumonia    20+ years ago    SURGICAL HISTORY: Past Surgical History:  Procedure Laterality Date   ABDOMINAL HYSTERECTOMY     BREAST SURGERY     COLON SURGERY  10/2017   [facility]   COLONOSCOPY WITH PROPOFOL N/A 01/14/2017   Procedure: COLONOSCOPY WITH PROPOFOL;  Surgeon: Lucilla Lame, MD;  Location: [facility] ENDOSCOPY;  Service: Endoscopy;  Laterality: N/A;   ELBOW SURGERY Right    Has had 4 surgeries on right elbow.   HAND SURGERY Right 2008   JOINT REPLACEMENT     LIVER BIOPSY  10/2017   lung wedge resection  10/2017   PORTA CATH INSERTION N/A 01/29/2017   Procedure: Glori Luis Cath Insertion;  Surgeon: Algernon Huxley, MD;  Location: [place] [facility];  Service: Cardiovascular;  Laterality: N/A;   TONSILLECTOMY      SOCIAL HISTORY: Social History   Socioeconomic History   Marital status: Divorced    Spouse name: Not on file   Number of children: Not on file   Years of education: Not on file   Highest education level: Not on file  Occupational History   Not on file  Tobacco Use   Smoking status: Every Day    Packs/day: 1.50    Years: 40.00    Pack years: 60.00    Types: Cigarettes    Smokeless tobacco: Never   Tobacco comments:    2 cigs daily-05/10/2021  Vaping Use   Vaping Use: Never used  Substance and Sexual Activity   Alcohol use: No   Drug use: No   Sexual activity: Not on file  Other Topics Concern   Not on  file  Social History Narrative   Not on file   Social Determinants of Health   Financial Resource Strain: Not on file  Food Insecurity: Not on file  Transportation Needs: Not on file  Physical Activity: Not on file  Stress: Not on file  Social Connections: Not on file  Intimate Partner Violence: Not on file    FAMILY HISTORY: Family History  Problem Relation Age of Onset   Lung cancer Mother 69   Esophageal cancer Father 50   Brain cancer Maternal Grandmother     ALLERGIES:  has No Known Allergies.  MEDICATIONS:  Current Outpatient Medications  Medication Sig Dispense Refill   folic acid (FOLVITE) 1 MG tablet Take 1 tablet (1 mg total) by mouth daily. 90 tablet 1   gabapentin (NEURONTIN) 300 MG capsule Take 1,200 mg by mouth 3 (three) times daily.      HYDROcodone-acetaminophen (NORCO) 10-325 MG tablet      nortriptyline (PAMELOR) 50 MG capsule Take 50 mg by mouth at bedtime.     omeprazole (PRILOSEC) 20 MG capsule TAKE 1 CAPSULE BY MOUTH EVERY DAY 30 capsule 1   Ostomy Supplies (PREMIER DRAINABLE POUCH 64MM) Pouch MISC      potassium chloride SA (KLOR-CON M20) 20 MEQ tablet Take 1 tablet (20 mEq total) by mouth 3 (three) times daily. 90 tablet 2   valsartan (DIOVAN) 80 MG tablet Take 80 mg by mouth daily.     albuterol (VENTOLIN HFA) 108 (90 Base) MCG/ACT inhaler Inhale 1-2 puffs into the lungs every 6 (six) hours as needed for wheezing or shortness of breath.      amLODipine (NORVASC) 5 MG tablet Take 5 mg by mouth daily.     baclofen (LIORESAL) 10 MG tablet Take 10 mg by mouth 3 (three) times daily.     Calcium Carbonate (CALCIUM 500 PO) Take 1 tablet by mouth daily.     chlorpheniramine-HYDROcodone (TUSSIONEX) 10-8 MG/5ML SUER Take 5  mLs by mouth at bedtime as needed for cough. 140 mL 0   No current facility-administered medications for this visit.      Marland Kitchen  PHYSICAL EXAMINATION: ECOG PERFORMANCE STATUS: 1 - Symptomatic but completely ambulatory  Vitals:   06/28/21 1006  BP: 124/63  Pulse: 98  Resp: 18  Temp: (!) 97.1 F (36.2 C)  SpO2: 90%   Filed Weights   06/28/21 1006  Weight: 178 lb (80.7 kg)    Physical Exam Vitals and nursing note reviewed.  HENT:     Head: Normocephalic and atraumatic.     Mouth/Throat:     Pharynx: Oropharynx is clear.  Eyes:     Extraocular Movements: Extraocular movements intact.     Pupils: Pupils are equal, round, and reactive to light.  Cardiovascular:     Rate and Rhythm: Normal rate and regular rhythm.  Pulmonary:     Comments: Decreased breath sounds bilaterally.  Abdominal:     Palpations: Abdomen is soft.  Musculoskeletal:        General: Normal range of motion.     Cervical back: Normal range of motion.  Skin:    General: Skin is warm.  Neurological:     General: No focal deficit present.     Mental Status: She is alert and oriented to person, place, and time.  Psychiatric:        Behavior: Behavior normal.        Judgment: Judgment normal.     LABORATORY DATA:  I have reviewed the data  as listed Lab Results  Component Value Date   WBC 15.9 (H) 06/08/2021   HGB 12.5 06/08/2021   HCT 37.3 06/08/2021   MCV 94.2 06/08/2021   PLT 233 06/08/2021   Recent Labs    07/04/20 1211 08/07/20 1354 12/14/20 1311 02/08/21 1445 06/06/21 1113 06/07/21 0523 06/08/21 0433  NA 138   < > 137 133* 135 137 137  K 3.5   < > 3.6 3.9 3.9 4.2 3.8  CL 99   < > 100 98 97* 100 100  CO2 28   < > 25 28 30 31  33*  GLUCOSE 125*   < > 101* 118* 123* 140* 88  BUN 10   < > 12 12 18 17 23   CREATININE 0.79   < > 0.93 0.77 0.64 0.58 0.73  CALCIUM 8.8*   < > 9.1 9.2 8.5* 8.4* 8.1*  GFRNONAA >60   < > >60 >60 >60 >60 >60  GFRAA >60  --   --   --   --   --   --   PROT  --     < > 7.3 7.3 6.5  --   --   ALBUMIN  --    < > 3.9 3.8 3.0*  --   --   AST  --    < > 20 16 19   --   --   ALT  --    < > 14 11 14   --   --   ALKPHOS  --    < > 79 89 92  --   --   BILITOT  --    < > 0.6 0.2* 0.6  --   --    < > = values in this interval not displayed.    RADIOGRAPHIC STUDIES: I have personally reviewed the radiological images as listed and agreed with the findings in the report. DG Chest 2 View  Result Date: 06/06/2021 CLINICAL DATA:  Shortness of breath.  History of lung cancer EXAM: CHEST - 2 VIEW COMPARISON:  08/24/2020, 10/31/2020 FINDINGS: Stable positioning of a right-sided chest port. Heart size within normal limits. Aortic atherosclerosis. Diffuse interstitial prominence bilaterally. Patchy opacities within the right mid to lower lung. Additional more focal opacity in the periphery of the left mid lung. No pleural effusion. No pneumothorax. IMPRESSION: Patchy opacities within the right mid to lower lung and periphery of the left mid lung, as described. Findings may reflect multifocal infection or edema. Progression of known malignancy is not excluded. Electronically Signed   By: Davina Poke D.O.   On: 06/06/2021 12:39   CT Angio Chest PE W and/or Wo Contrast  Result Date: 06/06/2021 CLINICAL DATA:  Shortness of breath and generalized chest pain EXAM: CT ANGIOGRAPHY CHEST WITH CONTRAST TECHNIQUE: Multidetector CT imaging of the chest was performed using the standard protocol during bolus administration of intravenous contrast. Multiplanar CT image reconstructions and MIPs were obtained to evaluate the vascular anatomy. CONTRAST:  42mL OMNIPAQUE IOHEXOL 350 MG/ML SOLN COMPARISON:  Chest x-ray from earlier in the same day, CT from 10/31/2020 FINDINGS: Cardiovascular: Atherosclerotic calcifications are noted without aneurysmal dilatation or dissection. No cardiac enlargement is noted. Mild coronary calcifications are seen. The pulmonary artery shows a normal branching  pattern without discrete filling defect to suggest pulmonary embolism. The lower lobe branches are somewhat limited in evaluation to patient motion artifact. Right chest wall port is noted. Mediastinum/Nodes: Thoracic inlet is within normal limits. Multiple hilar and mediastinal calcified lymph nodes  are noted consistent with prior granulomatous disease. No sizable adenopathy is seen. The esophagus as visualized is within normal limits. Lungs/Pleura: Lungs are well aerated bilaterally although patchy ground-glass airspace opacity is noted throughout both lungs consistent with multifocal pneumonia. More focal consolidation is noted in the left lower lobe. Scattered calcified granulomas are seen. Upper Abdomen: Visualized upper abdomen demonstrates a stable hypodensity within the medial segment of the left lobe of the liver consistent with prior ablation. Multiple splenic and hepatic granulomas are seen. No other focal abnormality is noted. Musculoskeletal: Degenerative changes of thoracic spine are seen. No rib abnormality is noted. Review of the MIP images confirms the above findings. IMPRESSION: No evidence of pulmonary emboli. Motion artifact somewhat limits evaluation of the lower lobe branches. Diffuse bilateral ground-glass opacities consistent with multifocal pneumonia. More marked consolidation is noted in the left lower lobe. Stable post ablation defect within the liver. Changes of prior granulomatous disease. Aortic Atherosclerosis (ICD10-I70.0). Electronically Signed   By: Inez Catalina M.D.   On: 06/06/2021 15:13   NM PET Image Restag (PS) Skull Base To Thigh  Result Date: 06/26/2021 CLINICAL DATA:  Subsequent treatment strategy for colorectal cancer surveillance in a 65 year old female. EXAM: NUCLEAR MEDICINE PET SKULL BASE TO THIGH TECHNIQUE: 10.42 mCi F-18 FDG was injected intravenously. Full-ring PET imaging was performed from the skull base to thigh after the radiotracer. CT data was obtained and  used for attenuation correction and anatomic localization. Fasting blood glucose: 91 mg/dl COMPARISON:  Recent CT of the chest and prior CT images and PET exams dating back to 2021. FINDINGS: Mediastinal blood pool activity: SUV max 1.90 Liver activity: SUV max NA NECK: No hypermetabolic lymph nodes in the neck. Incidental CT findings: none CHEST: LEFT chest wall metastatic lesion is new since January of 2022 and associated with increased metabolic activity and rib destruction involving the LEFT posterolateral eighth rib (image 109/3) this measures approximately 2.1 x 2.7 cm and demonstrates a maximum SUV of 8.88. No hypermetabolic lymph nodes in the chest. Small hypermetabolic pulmonary nodule in the RIGHT middle lobe (image 110/3) maximum SUV of 3.34 measuring 9 mm greatest axial dimension. Pleural and parenchymal scarring in the chest. RIGHT infrahilar volume loss has worsened since November of 2021 showing further progression over a series of prior studies but is not associated with focal hypermetabolic features. Signs of prior granulomatous disease in the RIGHT hilum and subcarinal region and stable small partially calcified nodule in the RIGHT lower lobe. Incidental CT findings: RIGHT-sided Port-A-Cath terminates in the RIGHT atrium. Small amount of pericardial fluid is similar to the recent chest CT. Normal caliber central pulmonary vasculature. Limited assessment of cardiovascular structures given lack of intravenous contrast. ABDOMEN/PELVIS: No signs of solid organ metastatic disease. Post APR. Mild increased metabolic activity overlying the sacrum with features more suggestive of post treatment changes little change compared to previous imaging no hypermetabolic lymph nodes in the retroperitoneum/abdomen or in the pelvis. Incidental CT findings: Ablation defect in the RIGHT hemi liver on the boundary of RIGHT and LEFT hepatic lobes 2.8 x 2.1 cm previously 3.1 x 2.3 cm in May of 2021. No focal  hypermetabolic activity in this area. No pericholecystic stranding. Pancreas with signs of atrophy. Splenic granulomatous changes. No acute findings relative to the kidneys or adrenal glands. The urinary bladder falls into the pelvis following APR without signs of adjacent inflammation. Infrarenal abdominal aortic aneurysm measuring 3.9 x 3.8 cm previously measured approximately 3.5 x 3.6 cm in February of 2021 and  is within 1 mm of its size in January 2022. Aortic atherosclerosis as before. No acute gastrointestinal process with parastomal herniation of redundant colon and fat into the LEFT lower quadrant ostomy defect. No perienteric stranding. Signs of fat necrosis above the urinary bladder and adjacent to small bowel loops in the pelvis. SKELETON: Pathologic fracture of LEFT posterior eighth rib associated with soft tissue as described. No additional signs of bony metastatic disease Incidental CT findings: none IMPRESSION: Signs of chest wall metastatic disease associated with pathologic fracture of LEFT eighth rib and rib destruction as well as metastatic disease to the RIGHT chest in the RIGHT middle lobe. Consolidative changes in the LEFT lung base progressing over time potentially related, likely related to post treatment changes in this area at the site of previous nodule. Treated disease in the liver without current evidence of increased metabolic activity and diminished in size compared to more remote studies. Post treatment changes about the pelvis following APR. Infrarenal abdominal aortic aneurysm unchanged compared to recent imaging, slightly enlarged compared to studies from 2021. Recommend follow-up every 2 years. This recommendation follows ACR consensus guidelines: White Paper of the ACR Incidental Findings Committee II on Vascular Findings. J Am Coll Radiol 2013; 10:789-794. Aortic Atherosclerosis (ICD10-I70.0). Aortic aneurysm NOS (ICD10-I71.9). Electronically Signed   By: Zetta Bills M.D.    On: 06/26/2021 15:15    ASSESSMENT & PLAN:   Rectal cancer ([place]) Stage IV rectal carcinoma: with lung/liver metastases [s/p wedge resection; declined lobectomy s/p SBRT-Lung; Liver-ablation]-CT scan- SEP 2022-Signs of chest wall metastatic disease associated with pathologicfracture of LEFT eighth rib and rib destruction with no evidence of metastatic disease in right lung.  SEP 2022- consolidative changes in the LEFT lung base progressing over time potentially related, likely related to post treatment changes in this area at the site of previous nodule.  PET avid left posterior chest wall lesion; no evidence of this metastatic disease.  #Plan proceeding with radiation as planned with Dr. Donella Stade.  Simulation next week; and then SBRT week of Oct 3rd.     Discussed with the patient that although PET scan shows local disease to the left chest wall-she might have micrometastatic disease-this might resurface down the line.  Patient understand that she has stage IV cancer which unfortunately incurable.  However for now hold off any systemic therapy.  We will consider imaging in about 3 months or so to evaluate response from radiation.  Also await evaluation with [facility] oncology.   #Pain control: [Dr.Chigdy; [facility]-pain clinic]-hydrocodone 10 every 5-6 hours [as per patient; for her back pain]; currently with left posterior chest wall metastatic lesion.  Currently stable.  Also s/p evaluation with Josh Borders; palliative care today.  Discussed with Josh.  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  Patient awaiting vascular evaluation at [facility].   # port flush every 2 to 3 months/next visit   # DISPOSITION: # follow up- MD; labs- cbc/cmp; 4th week of October; port flush- Dr.B  # I reviewed the blood work- with the patient in detail; also reviewed the imaging independently [as summarized above]; and with the  patient in detail.   # 40 minutes face-to-face with the patient discussing the above plan of care; more than 50% of time spent on prognosis/ natural history; counseling and coordination.     All questions were answered. The patient knows to call the  clinic with any problems, questions or concerns.   Cammie Sickle, MD 06/28/2021 2:13 PM

## 2021-07-03 ENCOUNTER — Ambulatory Visit: Payer: Medicare HMO

## 2021-07-05 ENCOUNTER — Ambulatory Visit: Payer: Medicare HMO

## 2021-07-06 ENCOUNTER — Encounter: Payer: Self-pay | Admitting: Pulmonary Disease

## 2021-07-09 ENCOUNTER — Ambulatory Visit: Payer: Medicare HMO

## 2021-07-12 ENCOUNTER — Ambulatory Visit: Payer: Medicare HMO

## 2021-07-31 ENCOUNTER — Ambulatory Visit
Admission: RE | Admit: 2021-07-31 | Discharge: 2021-07-31 | Disposition: A | Discharge status: Home / Self Care | Payer: Medicare HMO | Source: Ambulatory Visit | Attending: Radiation Oncology | Admitting: Radiation Oncology

## 2021-07-31 DIAGNOSIS — C189 Malignant neoplasm of colon, unspecified: Secondary | ICD-10-CM | POA: Diagnosis present

## 2021-07-31 DIAGNOSIS — C7802 Secondary malignant neoplasm of left lung: Secondary | ICD-10-CM | POA: Diagnosis not present

## 2021-07-31 DIAGNOSIS — C7951 Secondary malignant neoplasm of bone: Secondary | ICD-10-CM | POA: Diagnosis present

## 2021-07-31 DIAGNOSIS — Z510 Encounter for antineoplastic radiation therapy: Secondary | ICD-10-CM | POA: Insufficient documentation

## 2021-08-01 DIAGNOSIS — Z510 Encounter for antineoplastic radiation therapy: Secondary | ICD-10-CM | POA: Diagnosis not present

## 2021-08-02 ENCOUNTER — Ambulatory Visit
Admission: RE | Admit: 2021-08-02 | Discharge: 2021-08-02 | Disposition: A | Discharge status: Home / Self Care | Payer: Medicare HMO | Source: Ambulatory Visit | Attending: Radiation Oncology | Admitting: Radiation Oncology

## 2021-08-02 DIAGNOSIS — Z510 Encounter for antineoplastic radiation therapy: Secondary | ICD-10-CM | POA: Diagnosis not present

## 2021-08-03 ENCOUNTER — Inpatient Hospital Stay: Payer: Medicare HMO

## 2021-08-03 ENCOUNTER — Inpatient Hospital Stay: Payer: Medicare HMO | Admitting: Internal Medicine

## 2021-08-08 ENCOUNTER — Ambulatory Visit: Payer: Medicare HMO | Admitting: Radiation Oncology

## 2021-08-10 ENCOUNTER — Inpatient Hospital Stay (HOSPITAL_BASED_OUTPATIENT_CLINIC_OR_DEPARTMENT_OTHER): Payer: Medicare HMO | Admitting: Internal Medicine

## 2021-08-10 ENCOUNTER — Inpatient Hospital Stay: Payer: Medicare HMO | Attending: Internal Medicine

## 2021-08-10 ENCOUNTER — Other Ambulatory Visit: Payer: Self-pay

## 2021-08-10 VITALS — BP 114/70 | HR 87 | Temp 97.6°F | Resp 18 | Ht 67.0 in | Wt 181.0 lb

## 2021-08-10 DIAGNOSIS — R0789 Other chest pain: Secondary | ICD-10-CM | POA: Insufficient documentation

## 2021-08-10 DIAGNOSIS — Z452 Encounter for adjustment and management of vascular access device: Secondary | ICD-10-CM | POA: Diagnosis not present

## 2021-08-10 DIAGNOSIS — C787 Secondary malignant neoplasm of liver and intrahepatic bile duct: Secondary | ICD-10-CM | POA: Diagnosis not present

## 2021-08-10 DIAGNOSIS — J988 Other specified respiratory disorders: Secondary | ICD-10-CM | POA: Insufficient documentation

## 2021-08-10 DIAGNOSIS — R911 Solitary pulmonary nodule: Secondary | ICD-10-CM | POA: Insufficient documentation

## 2021-08-10 DIAGNOSIS — F17210 Nicotine dependence, cigarettes, uncomplicated: Secondary | ICD-10-CM | POA: Insufficient documentation

## 2021-08-10 DIAGNOSIS — C20 Malignant neoplasm of rectum: Secondary | ICD-10-CM | POA: Insufficient documentation

## 2021-08-10 DIAGNOSIS — Z95828 Presence of other vascular implants and grafts: Secondary | ICD-10-CM

## 2021-08-10 DIAGNOSIS — C7800 Secondary malignant neoplasm of unspecified lung: Secondary | ICD-10-CM | POA: Insufficient documentation

## 2021-08-10 DIAGNOSIS — I7143 Infrarenal abdominal aortic aneurysm, without rupture: Secondary | ICD-10-CM | POA: Diagnosis not present

## 2021-08-10 LAB — CBC WITH DIFFERENTIAL/PLATELET
Abs Immature Granulocytes: 0.03 10*3/uL (ref 0.00–0.07)
Basophils Absolute: 0 10*3/uL (ref 0.0–0.1)
Basophils Relative: 0 %
Eosinophils Absolute: 0.1 10*3/uL (ref 0.0–0.5)
Eosinophils Relative: 2 %
HCT: 38.1 % (ref 36.0–46.0)
Hemoglobin: 12.6 g/dL (ref 12.0–15.0)
Immature Granulocytes: 1 %
Lymphocytes Relative: 19 %
Lymphs Abs: 1 10*3/uL (ref 0.7–4.0)
MCH: 30.4 pg (ref 26.0–34.0)
MCHC: 33.1 g/dL (ref 30.0–36.0)
MCV: 91.8 fL (ref 80.0–100.0)
Monocytes Absolute: 0.5 10*3/uL (ref 0.1–1.0)
Monocytes Relative: 9 %
Neutro Abs: 3.8 10*3/uL (ref 1.7–7.7)
Neutrophils Relative %: 69 %
Platelets: 261 10*3/uL (ref 150–400)
RBC: 4.15 MIL/uL (ref 3.87–5.11)
RDW: 14 % (ref 11.5–15.5)
WBC: 5.5 10*3/uL (ref 4.0–10.5)
nRBC: 0 % (ref 0.0–0.2)

## 2021-08-10 LAB — COMPREHENSIVE METABOLIC PANEL
ALT: 9 U/L (ref 0–44)
AST: 14 U/L — ABNORMAL LOW (ref 15–41)
Albumin: 3.6 g/dL (ref 3.5–5.0)
Alkaline Phosphatase: 90 U/L (ref 38–126)
Anion gap: 8 (ref 5–15)
BUN: 8 mg/dL (ref 8–23)
CO2: 27 mmol/L (ref 22–32)
Calcium: 8.7 mg/dL — ABNORMAL LOW (ref 8.9–10.3)
Chloride: 102 mmol/L (ref 98–111)
Creatinine, Ser: 0.64 mg/dL (ref 0.44–1.00)
GFR, Estimated: >60 mL/min (ref >60)
Glucose, Bld: 104 mg/dL — ABNORMAL HIGH (ref 70–99)
Potassium: 3.5 mmol/L (ref 3.5–5.1)
Sodium: 137 mmol/L (ref 135–145)
Total Bilirubin: 0.4 mg/dL (ref 0.3–1.2)
Total Protein: 6.6 g/dL (ref 6.5–8.1)

## 2021-08-10 MED ORDER — HEPARIN SOD (PORK) LOCK FLUSH 100 UNIT/ML IV SOLN
INTRAVENOUS | Status: AC
  Filled 2021-08-10: qty 5

## 2021-08-10 MED ORDER — SODIUM CHLORIDE 0.9% FLUSH
10.0000 mL | Freq: Once | INTRAVENOUS | Status: AC
  Administered 2021-08-10: 10 mL via INTRAVENOUS
  Filled 2021-08-10: qty 10

## 2021-08-10 MED ORDER — HEPARIN SOD (PORK) LOCK FLUSH 100 UNIT/ML IV SOLN
500.0000 [IU] | Freq: Once | INTRAVENOUS | Status: AC
  Administered 2021-08-10: 500 [IU] via INTRAVENOUS
  Filled 2021-08-10: qty 5

## 2021-08-10 NOTE — Progress Notes (Signed)
[place] NOTE  Patient Care Team: Loni Muse, MD as PCP - General (Internal Medicine) Stitzenberg, Clint Lipps, MD as Referring Physician (Surgical Oncology) Lequita Asal, MD (Inactive) as Referring Physician (Hematology and Oncology) Tyler Pita, MD as Consulting Physician (Pulmonary Disease)  CHIEF COMPLAINTS/PURPOSE OF CONSULTATION: Rectal cancer metastatic  #  Oncology History Overview Note  She received 6 cycles of FOLFOX (02/18/2017 - 04/22/2017).               She underwent CT guided microwave ablation of the liver lesion on 06/06/2017.               She received radiation and daily Xeloda (06/18/2017- 08/15/2017).             She underwent APR with descending colostomy and VRAM flap on 10/24/2017.              She underwent a VATS wedge resection of her left lung.             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                         She declined left lower lobe lobectomy.                         She received 1 cycle of FOLFIRI (03/08/2020).                           She declined further chemotherapy or surgery.             She completed SBRT to the left infrahilar nodule on 05/22/2020.             CEA was 40.9 on 06/13/2020 and 9.5 on 10/31/2020.              Chest, abdomen, and pelvis CT on 10/31/2020 was personally reviewed.  Agree with radiology findings.   # SEP 2022-left posterior chest wall recurrent disease status post SBRT [finished OCT, 23rd, 2022]   Rectal cancer ([place])  01/13/2017 Initial Diagnosis   Rectal cancer ([place])   03/08/2020 -  Chemotherapy   The patient had dexamethasone (DECADRON) 4 MG tablet, 8 mg, Oral, Daily, 1 of 1 cycle, Start date: 03/08/2020, End date: 06/28/2020 palonosetron (ALOXI) injection 0.25 mg, 0.25 mg, Intravenous,  Once, 1 of 4 cycles Administration: 0.25 mg (03/08/2020) pegfilgrastim-jmdb (FULPHILA) injection 6 mg, 6 mg, Subcutaneous,  Once, 1 of 4  cycles Administration: 6 mg (03/10/2020) irinotecan (CAMPTOSAR) 360 mg in sodium chloride 0.9 % 500 mL chemo infusion, 180 mg/m2 = 360 mg, Intravenous,  Once, 1 of 4 cycles Administration: 360 mg (03/08/2020) fluorouracil (ADRUCIL) chemo injection 800 mg, 400 mg/m2 = 800 mg, Intravenous,  Once, 1 of 4 cycles Administration: 800 mg (03/08/2020) fluorouracil (ADRUCIL) 5,000 mg in sodium chloride 0.9 % 150 mL chemo infusion, 2,475 mg/m2 = 4,850 mg, Intravenous, 1 Day/Dose, 1 of 4 cycles Administration: 5,000 mg (03/08/2020) bevacizumab-bvzr (ZIRABEV) 400 mg in sodium chloride 0.9 % 100 mL chemo infusion, 5 mg/kg = 400 mg, Intravenous,  Once, 0 of 3 cycles leucovorin 812 mg in sodium chloride 0.9 % 250 mL infusion, 400 mg/m2 = 812 mg, Intravenous,  Once, 1 of 4 cycles Administration: 812 mg (03/08/2020)   for chemotherapy treatment.        HISTORY OF PRESENTING  ILLNESS: Patient alone.  Walks with a rolling walker. Tamara Hall 65 y.o.  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  The interim patient underwent evaluation with radiation oncology s/p SBRT finished 1 week ago.  Patient's states her back pain is improved.  Denies any nausea vomiting.   Chronic shortness of breath.  Chronic cough.   Review of Systems  Constitutional:  Positive for malaise/fatigue. Negative for chills, diaphoresis, fever and weight loss.       Left posterior chest wall pain.  HENT:  Negative for nosebleeds and sore throat.   Eyes:  Negative for double vision.  Respiratory:  Negative for cough, hemoptysis, sputum production, shortness of breath and wheezing.   Cardiovascular:  Negative for chest pain, palpitations, orthopnea and leg swelling.  Gastrointestinal:  Negative for abdominal pain, blood in stool, constipation, diarrhea, heartburn, melena, nausea and vomiting.  Genitourinary:  Negative for  dysuria, frequency and urgency.  Musculoskeletal:  Positive for back pain and joint pain.  Skin: Negative.  Negative for itching and rash.  Neurological:  Negative for dizziness, tingling, focal weakness, weakness and headaches.  Endo/Heme/Allergies:  Does not bruise/bleed easily.  Psychiatric/Behavioral:  Negative for depression. The patient is not nervous/anxious and does not have insomnia.     MEDICAL HISTORY:  Past Medical History:  Diagnosis Date   Cancer ([place])    colon, with mets to liver and left lung   Cough    Hypertension    Pneumonia    20+ years ago    SURGICAL HISTORY: Past Surgical History:  Procedure Laterality Date   ABDOMINAL HYSTERECTOMY     BREAST SURGERY     COLON SURGERY  10/2017   [facility]   COLONOSCOPY WITH PROPOFOL N/A 01/14/2017   Procedure: COLONOSCOPY WITH PROPOFOL;  Surgeon: Lucilla Lame, MD;  Location: [facility] ENDOSCOPY;  Service: Endoscopy;  Laterality: N/A;   ELBOW SURGERY Right    Has had 4 surgeries on right elbow.   HAND SURGERY Right 2008   JOINT REPLACEMENT     LIVER BIOPSY  10/2017   lung wedge resection  10/2017   PORTA CATH INSERTION N/A 01/29/2017   Procedure: Glori Luis Cath Insertion;  Surgeon: Algernon Huxley, MD;  Location: [facility];  Service: Cardiovascular;  Laterality: N/A;   TONSILLECTOMY      SOCIAL HISTORY: Social History   Socioeconomic History   Marital status: Divorced    Spouse name: Not on file   Number of children: Not on file   Years of education: Not on file   Highest education level: Not on file  Occupational History   Not on file  Tobacco Use   Smoking status: Every Day    Packs/day: 1.50    Years: 40.00    Pack years: 60.00    Types: Cigarettes   Smokeless tobacco: Never   Tobacco comments:    2 cigs daily-05/10/2021  Vaping Use   Vaping Use: Never used  Substance and Sexual Activity   Alcohol use: No   Drug use: No   Sexual activity: Not on file  Other Topics Concern   Not on file  Social History  Narrative   Not on file   Social Determinants of Health   Financial Resource Strain: Not on file  Food Insecurity: Not on file  Transportation Needs: Not on file  Physical Activity: Not on file  Stress: Not  on file  Social Connections: Not on file  Intimate Partner Violence: Not on file    FAMILY HISTORY: Family History  Problem Relation Age of Onset   Lung cancer Mother 77   Esophageal cancer Father 52   Brain cancer Maternal Grandmother     ALLERGIES:  has No Known Allergies.  MEDICATIONS:  Current Outpatient Medications  Medication Sig Dispense Refill   albuterol (VENTOLIN HFA) 108 (90 Base) MCG/ACT inhaler Inhale 1-2 puffs into the lungs every 6 (six) hours as needed for wheezing or shortness of breath.      amLODipine (NORVASC) 5 MG tablet Take 5 mg by mouth daily.     baclofen (LIORESAL) 10 MG tablet Take 10 mg by mouth 3 (three) times daily.     Calcium Carbonate (CALCIUM 500 PO) Take 1 tablet by mouth daily.     folic acid (FOLVITE) 1 MG tablet Take 1 tablet (1 mg total) by mouth daily. 90 tablet 1   gabapentin (NEURONTIN) 300 MG capsule Take 1,200 mg by mouth 3 (three) times daily.      HYDROcodone-acetaminophen (NORCO) 10-325 MG tablet Take 1 tablet by mouth every 4 (four) hours as needed for moderate pain or severe pain.     nortriptyline (PAMELOR) 50 MG capsule Take 50 mg by mouth at bedtime.     omeprazole (PRILOSEC) 20 MG capsule TAKE 1 CAPSULE BY MOUTH EVERY DAY 30 capsule 1   Ostomy Supplies (PREMIER DRAINABLE POUCH 64MM) Pouch MISC      valsartan (DIOVAN) 80 MG tablet Take 80 mg by mouth daily.     chlorpheniramine-HYDROcodone (TUSSIONEX) 10-8 MG/5ML SUER Take 5 mLs by mouth at bedtime as needed for cough. (Patient not taking: Reported on 08/10/2021) 140 mL 0   No current facility-administered medications for this visit.   Facility-Administered Medications Ordered in Other Visits  Medication Dose Route Frequency Provider Last Rate Last Admin   heparin lock  flush 100 UNIT/ML injection               .  PHYSICAL EXAMINATION: ECOG PERFORMANCE STATUS: 1 - Symptomatic but completely ambulatory  Vitals:   08/10/21 1336  BP: 114/70  Pulse: 87  Resp: 18  Temp: 97.6 F (36.4 C)  SpO2: 96%   Filed Weights   08/10/21 1336  Weight: 181 lb (82.1 kg)    Physical Exam Vitals and nursing note reviewed.  HENT:     Head: Normocephalic and atraumatic.     Mouth/Throat:     Pharynx: Oropharynx is clear.  Eyes:     Extraocular Movements: Extraocular movements intact.     Pupils: Pupils are equal, round, and reactive to light.  Cardiovascular:     Rate and Rhythm: Normal rate and regular rhythm.  Pulmonary:     Comments: Decreased breath sounds bilaterally.  Abdominal:     Palpations: Abdomen is soft.  Musculoskeletal:        General: Normal range of motion.     Cervical back: Normal range of motion.  Skin:    General: Skin is warm.  Neurological:     General: No focal deficit present.     Mental Status: She is alert and oriented to person, place, and time.  Psychiatric:        Behavior: Behavior normal.        Judgment: Judgment normal.     LABORATORY DATA:  I have reviewed the data as listed Lab Results  Component Value Date   WBC 5.5 08/10/2021  HGB 12.6 08/10/2021   HCT 38.1 08/10/2021   MCV 91.8 08/10/2021   PLT 261 08/10/2021   Recent Labs    02/08/21 1445 06/06/21 1113 06/07/21 0523 06/08/21 0433 08/10/21 1302  NA 133* 135 137 137 137  K 3.9 3.9 4.2 3.8 3.5  CL 98 97* 100 100 102  CO2 28 30 31  33* 27  GLUCOSE 118* 123* 140* 88 104*  BUN 12 18 17 23 8   CREATININE 0.77 0.64 0.58 0.73 0.64  CALCIUM 9.2 8.5* 8.4* 8.1* 8.7*  GFRNONAA >60 >60 >60 >60 >60  PROT 7.3 6.5  --   --  6.6  ALBUMIN 3.8 3.0*  --   --  3.6  AST 16 19  --   --  14*  ALT 11 14  --   --  9  ALKPHOS 89 92  --   --  90  BILITOT 0.2* 0.6  --   --  0.4    RADIOGRAPHIC STUDIES: I have personally reviewed the radiological images as listed  and agreed with the findings in the report. No results found.  ASSESSMENT & PLAN:   Rectal cancer ([place]) Stage IV rectal carcinoma: with History of  lung/liver metastases-PET scan SEP 2022- consolidative changes in the LEFT lung base progressing over time potentially related, likely related to post treatment changes in this area at the site of previous nodule.  PET avid left posterior chest wall lesion; no evidence of this metastatic disease; 9 mm right middle lobe lung nodule.   #Patient s/p SBRT to the left posterior chest wall [finished July 09, 2021].  Discussed the role of chemotherapy for stage IV recurrent rectal cancer.  However patient declines.  Recommend repeating imaging in 3 months.  Patient states that she canceled her appointments with [facility] oncology.  And she will follow-up with [place].  #Right middle lobe lung nodule-9 mm; await above scan.  If growing consider SBRT again.  #Pain control: [Dr.Chigdy; [facility]-pain clinic]-hydrocodone 10 every 5-6 hours [as per patient; for her back pain]; currently with left posterior chest wall metastatic lesion. STABLE.   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  Follow-up with [facility].    # port malfunction: recommend port study.  A fibrin sheath recommend tPA.   # DISPOSITION: # dye study for port # follow up- MD; 1st week of FEB, 2022-- labs- cbc/cmp/CEA;  port flush; CT CAP prior- Dr.B   All questions were answered. The patient knows to call the clinic with any problems, questions or concerns.   Cammie Sickle, MD 08/10/2021 4:35 PM

## 2021-08-10 NOTE — Assessment & Plan Note (Addendum)
Stage IV rectal carcinoma: with History of  lung/liver metastases-PET scan SEP 2022- consolidative changes in the LEFT lung base progressing over time potentially related, likely related to post treatment changes in this area at the site of previous nodule.  PET avid left posterior chest wall lesion; no evidence of this metastatic disease; 9 mm right middle lobe lung nodule.   #Patient s/p SBRT to the left posterior chest wall [finished July 09, 2021].  Discussed the role of chemotherapy for stage IV recurrent rectal cancer.  However patient declines.  Recommend repeating imaging in 3 months.  Patient states that she canceled her appointments with [facility] oncology.  And she will follow-up with [place].  #Right middle lobe lung nodule-9 mm; await above scan.  If growing consider SBRT again.  #Pain control: [Dr.Chigdy; [facility]-pain clinic]-hydrocodone 10 every 5-6 hours [as per patient; for her back pain]; currently with left posterior chest wall metastatic lesion. STABLE.   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  Follow-up with [facility]    # port malfunction: recommend port study.  A fibrin sheath recommend tPA.   # DISPOSITION: # dye study for port # follow up- MD; 1st week of FEB, 2022-- labs- cbc/cmp/CEA;  port flush; CT CAP prior- Dr.B

## 2021-08-22 ENCOUNTER — Encounter: Payer: Self-pay | Admitting: Pulmonary Disease

## 2021-09-04 ENCOUNTER — Ambulatory Visit: Payer: Medicare HMO | Admitting: Radiology

## 2021-09-06 ENCOUNTER — Ambulatory Visit: Payer: Medicare HMO | Admitting: Radiation Oncology

## 2021-09-07 ENCOUNTER — Other Ambulatory Visit: Payer: Self-pay | Admitting: Student

## 2021-09-10 ENCOUNTER — Ambulatory Visit
Admission: RE | Admit: 2021-09-10 | Discharge: 2021-09-10 | Disposition: A | Discharge status: Home / Self Care | Payer: Medicare HMO | Source: Ambulatory Visit | Attending: Internal Medicine | Admitting: Internal Medicine

## 2021-09-10 DIAGNOSIS — Z452 Encounter for adjustment and management of vascular access device: Secondary | ICD-10-CM | POA: Insufficient documentation

## 2021-09-10 DIAGNOSIS — C20 Malignant neoplasm of rectum: Secondary | ICD-10-CM | POA: Insufficient documentation

## 2021-09-10 HISTORY — PX: IR CV LINE INJECTION: IMG2294

## 2021-09-10 MED ORDER — HEPARIN SOD (PORK) LOCK FLUSH 100 UNIT/ML IV SOLN
INTRAVENOUS | Status: AC
  Administered 2021-09-10: 500 [IU]
  Filled 2021-09-10: qty 5

## 2021-09-10 MED ORDER — IOHEXOL 350 MG/ML SOLN
7.0000 mL | Freq: Once | INTRAVENOUS | Status: AC | PRN
  Administered 2021-09-10: 7 mL
  Filled 2021-09-10: qty 10

## 2021-09-19 ENCOUNTER — Telehealth: Payer: Self-pay | Admitting: Internal Medicine

## 2021-09-19 NOTE — Telephone Encounter (Signed)
Pt called to make a appt to get her  results. Please call back at [phone]

## 2021-09-19 NOTE — Telephone Encounter (Signed)
Pt was calling to state they found out where her blockage was she is got appt in FEB she will discuss with him then.

## 2021-10-30 ENCOUNTER — Other Ambulatory Visit: Payer: Self-pay | Admitting: *Deleted

## 2021-10-30 DIAGNOSIS — K2100 Gastro-esophageal reflux disease with esophagitis, without bleeding: Secondary | ICD-10-CM

## 2021-10-30 MED ORDER — OMEPRAZOLE 20 MG PO CPDR: 20.0000 mg | DELAYED_RELEASE_CAPSULE | Freq: Every day | ORAL | 3 refills | Status: DC

## 2021-11-02 ENCOUNTER — Other Ambulatory Visit: Payer: Self-pay

## 2021-11-02 ENCOUNTER — Ambulatory Visit
Admission: RE | Admit: 2021-11-02 | Discharge: 2021-11-02 | Disposition: A | Discharge status: Home / Self Care | Payer: Medicare HMO | Source: Ambulatory Visit | Attending: Internal Medicine | Admitting: Internal Medicine

## 2021-11-02 DIAGNOSIS — C20 Malignant neoplasm of rectum: Secondary | ICD-10-CM | POA: Insufficient documentation

## 2021-11-02 LAB — POCT I-STAT CREATININE: Creatinine, Ser: 0.8 mg/dL (ref 0.44–1.00)

## 2021-11-02 MED ORDER — IOHEXOL 300 MG/ML  SOLN
100.0000 mL | Freq: Once | INTRAMUSCULAR | Status: AC | PRN
  Administered 2021-11-02: 100 mL via INTRAVENOUS

## 2021-11-05 ENCOUNTER — Ambulatory Visit: Payer: Medicare HMO

## 2021-11-08 ENCOUNTER — Other Ambulatory Visit: Payer: Self-pay | Admitting: Internal Medicine

## 2021-11-08 DIAGNOSIS — E538 Deficiency of other specified B group vitamins: Secondary | ICD-10-CM

## 2021-11-09 ENCOUNTER — Inpatient Hospital Stay: Payer: Medicare HMO | Attending: Internal Medicine

## 2021-11-09 ENCOUNTER — Encounter: Payer: Self-pay | Admitting: Hematology and Oncology

## 2021-11-09 ENCOUNTER — Inpatient Hospital Stay (HOSPITAL_BASED_OUTPATIENT_CLINIC_OR_DEPARTMENT_OTHER): Payer: Medicare HMO | Admitting: Hospice and Palliative Medicine

## 2021-11-09 ENCOUNTER — Other Ambulatory Visit: Payer: Self-pay

## 2021-11-09 ENCOUNTER — Encounter: Payer: Self-pay | Admitting: Internal Medicine

## 2021-11-09 ENCOUNTER — Inpatient Hospital Stay (HOSPITAL_BASED_OUTPATIENT_CLINIC_OR_DEPARTMENT_OTHER): Payer: Medicare HMO | Admitting: Internal Medicine

## 2021-11-09 ENCOUNTER — Telehealth: Payer: Self-pay

## 2021-11-09 VITALS — BP 132/60 | HR 88 | Temp 96.8°F | Ht 67.0 in | Wt 185.2 lb

## 2021-11-09 DIAGNOSIS — Z515 Encounter for palliative care: Secondary | ICD-10-CM

## 2021-11-09 DIAGNOSIS — C7989 Secondary malignant neoplasm of other specified sites: Secondary | ICD-10-CM | POA: Diagnosis not present

## 2021-11-09 DIAGNOSIS — C20 Malignant neoplasm of rectum: Secondary | ICD-10-CM | POA: Diagnosis present

## 2021-11-09 DIAGNOSIS — Z452 Encounter for adjustment and management of vascular access device: Secondary | ICD-10-CM | POA: Insufficient documentation

## 2021-11-09 DIAGNOSIS — C7800 Secondary malignant neoplasm of unspecified lung: Secondary | ICD-10-CM | POA: Diagnosis not present

## 2021-11-09 DIAGNOSIS — E876 Hypokalemia: Secondary | ICD-10-CM

## 2021-11-09 DIAGNOSIS — C7802 Secondary malignant neoplasm of left lung: Secondary | ICD-10-CM | POA: Diagnosis not present

## 2021-11-09 DIAGNOSIS — C787 Secondary malignant neoplasm of liver and intrahepatic bile duct: Secondary | ICD-10-CM | POA: Diagnosis not present

## 2021-11-09 LAB — COMPREHENSIVE METABOLIC PANEL
ALT: 9 U/L (ref 0–44)
AST: 14 U/L — ABNORMAL LOW (ref 15–41)
Albumin: 3.8 g/dL (ref 3.5–5.0)
Alkaline Phosphatase: 92 U/L (ref 38–126)
Anion gap: 7 (ref 5–15)
BUN: 9 mg/dL (ref 8–23)
CO2: 27 mmol/L (ref 22–32)
Calcium: 8.7 mg/dL — ABNORMAL LOW (ref 8.9–10.3)
Chloride: 101 mmol/L (ref 98–111)
Creatinine, Ser: 0.72 mg/dL (ref 0.44–1.00)
GFR, Estimated: >60 mL/min (ref >60)
Glucose, Bld: 96 mg/dL (ref 70–99)
Potassium: 3.7 mmol/L (ref 3.5–5.1)
Sodium: 135 mmol/L (ref 135–145)
Total Bilirubin: 0.2 mg/dL — ABNORMAL LOW (ref 0.3–1.2)
Total Protein: 7 g/dL (ref 6.5–8.1)

## 2021-11-09 LAB — CBC WITH DIFFERENTIAL/PLATELET
Abs Immature Granulocytes: 0.02 10*3/uL (ref 0.00–0.07)
Basophils Absolute: 0 10*3/uL (ref 0.0–0.1)
Basophils Relative: 0 %
Eosinophils Absolute: 0.1 10*3/uL (ref 0.0–0.5)
Eosinophils Relative: 1 %
HCT: 41.3 % (ref 36.0–46.0)
Hemoglobin: 13.8 g/dL (ref 12.0–15.0)
Immature Granulocytes: 0 %
Lymphocytes Relative: 18 %
Lymphs Abs: 1.1 10*3/uL (ref 0.7–4.0)
MCH: 30.7 pg (ref 26.0–34.0)
MCHC: 33.4 g/dL (ref 30.0–36.0)
MCV: 91.8 fL (ref 80.0–100.0)
Monocytes Absolute: 0.6 10*3/uL (ref 0.1–1.0)
Monocytes Relative: 9 %
Neutro Abs: 4.4 10*3/uL (ref 1.7–7.7)
Neutrophils Relative %: 72 %
Platelets: 248 10*3/uL (ref 150–400)
RBC: 4.5 MIL/uL (ref 3.87–5.11)
RDW: 13.1 % (ref 11.5–15.5)
WBC: 6.2 10*3/uL (ref 4.0–10.5)
nRBC: 0 % (ref 0.0–0.2)

## 2021-11-09 MED ORDER — HEPARIN SOD (PORK) LOCK FLUSH 100 UNIT/ML IV SOLN
500.0000 [IU] | Freq: Once | INTRAVENOUS | Status: AC | PRN
  Administered 2021-11-09: 500 [IU]
  Filled 2021-11-09: qty 5

## 2021-11-09 NOTE — Progress Notes (Signed)
[place] NOTE  Patient Care Team: Loni Muse, MD as PCP - General (Internal Medicine) Stitzenberg, Clint Lipps, MD as Referring Physician (Surgical Oncology) Lequita Asal, MD (Inactive) as Referring Physician (Hematology and Oncology) Tyler Pita, MD as Consulting Physician (Pulmonary Disease) Cammie Sickle, MD as Medical Oncologist (Internal Medicine)  CHIEF COMPLAINTS/PURPOSE OF CONSULTATION: Rectal cancer metastatic  #  Oncology History Overview Note  She received 6 cycles of FOLFOX (02/18/2017 - 04/22/2017).               She underwent CT guided microwave ablation of the liver lesion on 06/06/2017.               She received radiation and daily Xeloda (06/18/2017- 08/15/2017).             She underwent APR with descending colostomy and VRAM flap on 10/24/2017.              She underwent a VATS wedge resection of her left lung.             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                         She declined left lower lobe lobectomy.                         She received 1 cycle of FOLFIRI (03/08/2020).                           She declined further chemotherapy or surgery.             She completed SBRT to the left infrahilar nodule on 05/22/2020.             CEA was 40.9 on 06/13/2020 and 9.5 on 10/31/2020.              Chest, abdomen, and pelvis CT on 10/31/2020 was personally reviewed.  Agree with radiology findings.   # SEP 2022-left posterior chest wall recurrent disease status post SBRT [finished OCT, 23rd, 2022] CT scan- JAN 2023 shows progressive disease in bilateral lungs; and also increasing expansile size of the left rib lesion; no progression of disease in the liver.   Rectal cancer ([place])  01/13/2017 Initial Diagnosis   Rectal cancer ([place])   03/08/2020 - 03/10/2020 Chemotherapy   Patient is on Treatment Plan : COLORECTAL FOLFIRI / BEVACIZUMAB Q14D     11/09/2021 -   Chemotherapy   Patient is on Treatment Plan : COLORECTAL FOLFOX + Bevacizumab q14d        HISTORY OF PRESENTING ILLNESS: Patient alone.  Walks with a rolling walker. Tamara Hall 66 y.o.  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   Patient s/p SBRT to her left posterior eighth rib lesion approximately 3 months ago.  States her pain is improved.  Not resolved. She continues with her chronic pain medication.   Review of Systems  Constitutional:  Positive for malaise/fatigue. Negative for chills, diaphoresis, fever and weight loss.       Left posterior chest wall pain.  HENT:  Negative for nosebleeds and sore throat.   Eyes:  Negative for double vision.  Respiratory:  Negative for cough,  hemoptysis, sputum production, shortness of breath and wheezing.   Cardiovascular:  Negative for chest pain, palpitations, orthopnea and leg swelling.  Gastrointestinal:  Negative for abdominal pain, blood in stool, constipation, diarrhea, heartburn, melena, nausea and vomiting.  Genitourinary:  Negative for dysuria, frequency and urgency.  Musculoskeletal:  Positive for back pain and joint pain.  Skin: Negative.  Negative for itching and rash.  Neurological:  Negative for dizziness, tingling, focal weakness, weakness and headaches.  Endo/Heme/Allergies:  Does not bruise/bleed easily.  Psychiatric/Behavioral:  Negative for depression. The patient is not nervous/anxious and does not have insomnia.     MEDICAL HISTORY:  Past Medical History:  Diagnosis Date   Cancer ([place])    colon, with mets to liver and left lung   Cough    Hypertension    Pneumonia    20+ years ago    SURGICAL HISTORY: Past Surgical History:  Procedure Laterality Date   ABDOMINAL HYSTERECTOMY     BREAST SURGERY     COLON SURGERY  10/2017   [facility]   COLONOSCOPY WITH PROPOFOL N/A 01/14/2017   Procedure:  COLONOSCOPY WITH PROPOFOL;  Surgeon: Lucilla Lame, MD;  Location: [facility] ENDOSCOPY;  Service: Endoscopy;  Laterality: N/A;   ELBOW SURGERY Right    Has had 4 surgeries on right elbow.   HAND SURGERY Right 2008   IR CV LINE INJECTION  09/10/2021   JOINT REPLACEMENT     LIVER BIOPSY  10/2017   lung wedge resection  10/2017   PORTA CATH INSERTION N/A 01/29/2017   Procedure: Glori Luis Cath Insertion;  Surgeon: Algernon Huxley, MD;  Location: [facility];  Service: Cardiovascular;  Laterality: N/A;   TONSILLECTOMY      SOCIAL HISTORY: Social History   Socioeconomic History   Marital status: Divorced    Spouse name: Not on file   Number of children: Not on file   Years of education: Not on file   Highest education level: Not on file  Occupational History   Not on file  Tobacco Use   Smoking status: Every Day    Packs/day: 1.50    Years: 40.00    Pack years: 60.00    Types: Cigarettes   Smokeless tobacco: Never   Tobacco comments:    2 cigs daily-05/10/2021  Vaping Use   Vaping Use: Never used  Substance and Sexual Activity   Alcohol use: No   Drug use: No   Sexual activity: Not on file  Other Topics Concern   Not on file  Social History Narrative   Not on file   Social Determinants of Health   Financial Resource Strain: Not on file  Food Insecurity: Not on file  Transportation Needs: Not on file  Physical Activity: Not on file  Stress: Not on file  Social Connections: Not on file  Intimate Partner Violence: Not on file    FAMILY HISTORY: Family History  Problem Relation Age of Onset   Lung cancer Mother 41   Esophageal cancer Father 10   Brain cancer Maternal Grandmother     ALLERGIES:  has No Known Allergies.  MEDICATIONS:  Current Outpatient Medications  Medication Sig Dispense Refill   albuterol (VENTOLIN HFA) 108 (90 Base) MCG/ACT inhaler Inhale 1-2 puffs into the lungs every 6 (six) hours as needed for wheezing or shortness of breath.      amLODipine  (NORVASC) 5 MG tablet Take 5 mg by mouth daily.     baclofen (LIORESAL) 10 MG tablet Take 10  mg by mouth 3 (three) times daily.     Calcium Carbonate (CALCIUM 500 PO) Take 1 tablet by mouth daily.     folic acid (FOLVITE) 1 MG tablet TAKE 1 TABLET BY MOUTH EVERY DAY 90 tablet 1   gabapentin (NEURONTIN) 300 MG capsule Take 1,200 mg by mouth 3 (three) times daily.      HYDROcodone-acetaminophen (NORCO) 10-325 MG tablet Take 1 tablet by mouth every 4 (four) hours as needed for moderate pain or severe pain.     nortriptyline (PAMELOR) 50 MG capsule Take 50 mg by mouth at bedtime.     omeprazole (PRILOSEC) 20 MG capsule Take 1 capsule (20 mg total) by mouth daily. 30 capsule 3   Ostomy Supplies (PREMIER DRAINABLE POUCH 64MM) Pouch MISC      valsartan (DIOVAN) 80 MG tablet Take 80 mg by mouth daily.     zolpidem (AMBIEN) 5 MG tablet Take 5 mg by mouth at bedtime as needed.     No current facility-administered medications for this visit.   Facility-Administered Medications Ordered in Other Visits  Medication Dose Route Frequency Provider Last Rate Last Admin   heparin lock flush 100 UNIT/ML injection               .  PHYSICAL EXAMINATION: ECOG PERFORMANCE STATUS: 1 - Symptomatic but completely ambulatory  Vitals:   11/09/21 1404  BP: 132/60  Pulse: 88  Temp: (!) 96.8 F (36 C)  SpO2: 98%   Filed Weights   11/09/21 1404  Weight: 185 lb 3.2 oz (84 kg)    Physical Exam Vitals and nursing note reviewed.  HENT:     Head: Normocephalic and atraumatic.     Mouth/Throat:     Pharynx: Oropharynx is clear.  Eyes:     Extraocular Movements: Extraocular movements intact.     Pupils: Pupils are equal, round, and reactive to light.  Cardiovascular:     Rate and Rhythm: Normal rate and regular rhythm.  Pulmonary:     Comments: Decreased breath sounds bilaterally.  Abdominal:     Palpations: Abdomen is soft.  Musculoskeletal:        General: Normal range of motion.     Cervical back:  Normal range of motion.  Skin:    General: Skin is warm.  Neurological:     General: No focal deficit present.     Mental Status: She is alert and oriented to person, place, and time.  Psychiatric:        Behavior: Behavior normal.        Judgment: Judgment normal.     LABORATORY DATA:  I have reviewed the data as listed Lab Results  Component Value Date   WBC 6.2 11/09/2021   HGB 13.8 11/09/2021   HCT 41.3 11/09/2021   MCV 91.8 11/09/2021   PLT 248 11/09/2021   Recent Labs    06/06/21 1113 06/07/21 0523 06/08/21 0433 08/10/21 1302 11/02/21 1253 11/09/21 1340  NA 135   < > 137 137  --  135  K 3.9   < > 3.8 3.5  --  3.7  CL 97*   < > 100 102  --  101  CO2 30   < > 33* 27  --  27  GLUCOSE 123*   < > 88 104*  --  96  BUN 18   < > 23 8  --  9  CREATININE 0.64   < > 0.73 0.64 0.80 0.72  CALCIUM 8.5*   < >  8.1* 8.7*  --  8.7*  GFRNONAA >60   < > >60 >60  --  >60  PROT 6.5  --   --  6.6  --  7.0  ALBUMIN 3.0*  --   --  3.6  --  3.8  AST 19  --   --  14*  --  14*  ALT 14  --   --  9  --  9  ALKPHOS 92  --   --  90  --  92  BILITOT 0.6  --   --  0.4  --  0.2*   < > = values in this interval not displayed.    RADIOGRAPHIC STUDIES: I have personally reviewed the radiological images as listed and agreed with the findings in the report. CT CHEST ABDOMEN PELVIS W CONTRAST  Result Date: 11/04/2021 CLINICAL DATA:  Colorectal cancer, assess treatment response EXAM: CT CHEST, ABDOMEN, AND PELVIS WITH CONTRAST TECHNIQUE: Multidetector CT imaging of the chest, abdomen and pelvis was performed following the standard protocol during bolus administration of intravenous contrast. RADIATION DOSE REDUCTION: This exam was performed according to the departmental dose-optimization program which includes automated exposure control, adjustment of the mA and/or kV according to patient size and/or use of iterative reconstruction technique. CONTRAST:  188mL OMNIPAQUE IOHEXOL 300 MG/ML SOLN  additional oral enteric contrast COMPARISON:  PET-CT, 06/26/2021, CT chest, 10/31/2020 FINDINGS: CT CHEST FINDINGS Cardiovascular: Right chest port catheter. Aortic atherosclerosis. Normal heart size. Three-vessel coronary artery calcifications. Slight interval increase in a small pericardial effusion. Mediastinum/Nodes: No enlarged mediastinal, hilar, or axillary lymph nodes. Small calcified subcarinal and bilateral hilar lymph nodes. Thyroid gland, trachea, and esophagus demonstrate no significant findings. Lungs/Pleura: Redemonstrated post treatment appearance of the left lower lobe, with a dense, bandlike consolidation centrally. There are numerous new and enlarged bilateral pulmonary nodules, for example the largest nodule of the medial segment right middle lobe measuring 1.2 x 1.1 cm, previously 0.9 x 0.8 cm (series 3, image 99). New nodule of the medial right lower lobe in the azygoesophageal recess measuring 0.4 cm (series 3, image 113). New subpleural nodule of the dependent left lower lobe measuring 0.8 x 0.6 cm (series 3, image 109). No pleural effusion or pneumothorax. Musculoskeletal: Increased bony destruction of the posterior left eighth rib with associated soft tissue mass, increased in size compared to prior PET-CT, measuring approximately 4.0 x 1.9 cm, previously 2.7 x 2.1 cm when measured similarly (series 2, image 36). CT ABDOMEN PELVIS FINDINGS Hepatobiliary: Unchanged post treatment appearance of a hypoenhancing lesion of the central liver, hepatic segment IVA, measuring 3.1 x 2.3 cm (series 2, image 52). Scattered parenchymal calcifications throughout in keeping with prior granulomatous infection. No gallstones, gallbladder wall thickening, or biliary dilatation. Pancreas: Unremarkable. No pancreatic ductal dilatation or surrounding inflammatory changes. Spleen: Normal in size. Parenchymal calcifications in keeping with prior granulomatous infection. Adrenals/Urinary Tract: Adrenal glands are  unremarkable. Kidneys are normal, without renal calculi, solid lesion, or hydronephrosis. Bladder is unremarkable. Stomach/Bowel: Stomach is within normal limits. Appendix is not clearly visualized. Status post abdominoperineal resection with left lower quadrant end colostomy. Unchanged postoperative/post treatment appearance of the pelvis, with presacral soft tissue thickening and scattered areas of partially calcified fat necrosis (series 2, image 105). Vascular/Lymphatic: Aortic atherosclerosis. Minimal enlargement of an infrarenal abdominal aortic aneurysm with eccentric mural thrombus, measuring 4.1 x 4.1 cm, previously 3.9 x 3.8 cm (series 2, image 77). No enlarged abdominal or pelvic lymph nodes. Reproductive: Status post hysterectomy Other: Left lower  quadrant parastomal hernia containing fat and colon. No ascites. Musculoskeletal: No acute osseous findings. Redemonstrated sclerotic fractures of the bilateral sacral ala (series 2, image 96). IMPRESSION: 1. Numerous new and enlarged bilateral pulmonary nodules, consistent with worsened pulmonary metastatic disease. 2. Increased bony destruction of the posterior left eighth rib with associated soft tissue mass, increased in size compared to prior PET-CT, consistent with a worsened chest wall metastasis. 3. Unchanged post treatment appearance of a hypoenhancing metastatic lesion of the central liver. No new liver lesions. 4. Status post abdominoperineal resection with left lower quadrant end colostomy and parastomal hernia. Unchanged postoperative/post treatment appearance of the pelvis. 5. Slight interval increase in a small pericardial effusion. 6. Coronary artery disease. 7. Minimal enlargement of an infrarenal abdominal aortic aneurysm with eccentric mural thrombus, measuring 4.1 x 4.1 cm, previously 3.9 x 3.8 cm. Recommend follow-up every 12 months and vascular consultation. This recommendation follows ACR consensus guidelines: White Paper of the ACR  Incidental Findings Committee II on Vascular Findings. J Am Coll Radiol 2013; 10:789-794. Electronically Signed   By: Delanna Ahmadi M.D.   On: 11/04/2021 07:33    ASSESSMENT & PLAN:   Rectal cancer ([place]) Stage IV rectal carcinoma: with History of  lung/liver metastases-s/p SBRT-left posterior chest wall lesion [July 09, 2021 finished].  Restaging CT scan- JAN 2023 shows progressive disease in bilateral lungs; and also increasing expansile size of the left rib lesion; no progression of disease in the liver.  #Had a long discussion with the patient regarding the concerning findings noted on the CT scan-for progression of disease.  Recommend systemic chemotherapy on a palliative basis.  Patient is interested but would recommend proceeding with chemotherapy for now.  She states that she will need some time; and wants to return back to [place] in about 2 months-and then proceed with chemotherapy if she is still interested.  I discussed my concerns regarding progressive disease/worsening symptoms; patient reluctant with following up with [place] anytime sooner.  #Patient wants to avoid hair loss;  I discussed that FOLFOX chemotherapy is given every 2 weeks; discuss the potential side effects including but not limited to nausea vomiting diarrhea, sores in the mouth, hand-foot syndrome; also tingling and numbness/cold sensitivity with oxaliplatin. Add avastin with cycle #2.   #Pain control: [Dr.Chigdy; [facility]-pain clinic]-hydrocodone 10 every 5-6 hours [as per patient; for her back pain]; currently with left posterior chest wall metastatic lesion-s/p SBRT-interestingly no significant response noted.Marland Kitchen STABLE.   #Infrarenal AAA- g 4.1 x 4.1 cm, previously 3.9 x 3.8 cm; I reviewed the recent evaluation with vascular surgery at [facility].  No recommendation for surgery unless greater than 5 cm.  Obviously any surgical options to be in the context of her metastatic colorectal cancer/and her interest with therapies.  #Incidental  findings on CT  Imaging dated:JAn 2023- AAA; coronary artery disease atherosclerosis etc.  I reviewed/discussed/counseled the patient.   # port malfunction: s/p port study-positive for. fibrin sheath recommend tPA.   # DISPOSITION: # will need TPA  # follow up- MD; in 2 months- labs- cbc/cmp/CEA; port flush; FOLFOX-;Dr.B  # I reviewed the blood work- with the patient in detail; also reviewed the imaging independently [as summarized above]; and with the patient in detail.      All questions were answered. The patient knows to call the clinic with any problems, questions or concerns.   Cammie Sickle, MD 11/09/2021 5:41 PM

## 2021-11-09 NOTE — Progress Notes (Signed)
[place]  [phone] [phone]   Name: MONICA CODD Date: 11/09/2021 MRN: [id number]  DOB: Sep 02, 1956  Patient Care Team: Loni Muse, MD as PCP - General (Internal Medicine) Stitzenberg, Clint Lipps, MD as Referring Physician (Surgical Oncology) Lequita Asal, MD (Inactive) as Referring Physician (Hematology and Oncology) Tyler Pita, MD as Consulting Physician (Pulmonary Disease) Cammie Sickle, MD as Medical Oncologist (Internal Medicine)    REASON FOR CONSULTATION: Tamara Hall is a 66 y.o. 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  Patient is status post RT but had previously declined chemotherapy.  CTs on 11/02/2021 revealed numerous new enlarged bilateral pulmonary nodules, increased bony destruction/chest wall metastasis and stable liver metastasis.  Palliative care was consulted to address goals.   SOCIAL HISTORY:     reports that she has been smoking cigarettes. She has a 60.00 pack-year smoking history. She has never used smokeless tobacco. She reports that she does not drink alcohol and does not use drugs.  ADVANCE DIRECTIVES:  None on file  CODE STATUS:   PAST MEDICAL HISTORY: Past Medical History:  Diagnosis Date   Cancer ([place])    colon, with mets to liver and left lung   Cough    Hypertension    Pneumonia    20+ years ago    PAST SURGICAL HISTORY:  Past Surgical History:  Procedure Laterality Date   ABDOMINAL HYSTERECTOMY     BREAST SURGERY     COLON SURGERY  10/2017   [facility]   COLONOSCOPY WITH PROPOFOL N/A 01/14/2017   Procedure: COLONOSCOPY WITH PROPOFOL;  Surgeon: Lucilla Lame, MD;  Location: [facility] ENDOSCOPY;  Service: Endoscopy;  Laterality: N/A;   ELBOW SURGERY Right    Has had 4 surgeries  on right elbow.   HAND SURGERY Right 2008   IR CV LINE INJECTION  09/10/2021   JOINT REPLACEMENT     LIVER BIOPSY  10/2017   lung wedge resection  10/2017   PORTA CATH INSERTION N/A 01/29/2017   Procedure: Glori Luis Cath Insertion;  Surgeon: Algernon Huxley, MD;  Location: [facility];  Service: Cardiovascular;  Laterality: N/A;   TONSILLECTOMY      HEMATOLOGY/ONCOLOGY HISTORY:  Oncology History Overview Note  She received 6 cycles of FOLFOX (02/18/2017 - 04/22/2017).               She underwent CT guided microwave ablation of the liver lesion on 06/06/2017.               She received radiation and daily Xeloda (06/18/2017- 08/15/2017).             She underwent APR with descending colostomy and VRAM flap on 10/24/2017.              She underwent a VATS wedge resection of her left lung.             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                         She declined left lower lobe lobectomy.                         She received 1 cycle of FOLFIRI (03/08/2020).  She declined further chemotherapy or surgery.             She completed SBRT to the left infrahilar nodule on 05/22/2020.             CEA was 40.9 on 06/13/2020 and 9.5 on 10/31/2020.              Chest, abdomen, and pelvis CT on 10/31/2020 was personally reviewed.  Agree with radiology findings.   # SEP 2022-left posterior chest wall recurrent disease status post SBRT [finished OCT, 23rd, 2022]   Rectal cancer ([place])  01/13/2017 Initial Diagnosis   Rectal cancer ([place])   03/08/2020 -  Chemotherapy   The patient had dexamethasone (DECADRON) 4 MG tablet, 8 mg, Oral, Daily, 1 of 1 cycle, Start date: 03/08/2020, End date: 06/28/2020 palonosetron (ALOXI) injection 0.25 mg, 0.25 mg, Intravenous,  Once, 1 of 4 cycles Administration: 0.25 mg (03/08/2020) pegfilgrastim-jmdb (FULPHILA) injection 6 mg, 6 mg, Subcutaneous,  Once, 1 of 4 cycles Administration: 6 mg  (03/10/2020) irinotecan (CAMPTOSAR) 360 mg in sodium chloride 0.9 % 500 mL chemo infusion, 180 mg/m2 = 360 mg, Intravenous,  Once, 1 of 4 cycles Administration: 360 mg (03/08/2020) fluorouracil (ADRUCIL) chemo injection 800 mg, 400 mg/m2 = 800 mg, Intravenous,  Once, 1 of 4 cycles Administration: 800 mg (03/08/2020) fluorouracil (ADRUCIL) 5,000 mg in sodium chloride 0.9 % 150 mL chemo infusion, 2,475 mg/m2 = 4,850 mg, Intravenous, 1 Day/Dose, 1 of 4 cycles Administration: 5,000 mg (03/08/2020) bevacizumab-bvzr (ZIRABEV) 400 mg in sodium chloride 0.9 % 100 mL chemo infusion, 5 mg/kg = 400 mg, Intravenous,  Once, 0 of 3 cycles leucovorin 812 mg in sodium chloride 0.9 % 250 mL infusion, 400 mg/m2 = 812 mg, Intravenous,  Once, 1 of 4 cycles Administration: 812 mg (03/08/2020)  for chemotherapy treatment.      ALLERGIES:  has No Known Allergies.  MEDICATIONS:  Current Outpatient Medications  Medication Sig Dispense Refill   albuterol (VENTOLIN HFA) 108 (90 Base) MCG/ACT inhaler Inhale 1-2 puffs into the lungs every 6 (six) hours as needed for wheezing or shortness of breath.      amLODipine (NORVASC) 5 MG tablet Take 5 mg by mouth daily.     baclofen (LIORESAL) 10 MG tablet Take 10 mg by mouth 3 (three) times daily.     Calcium Carbonate (CALCIUM 500 PO) Take 1 tablet by mouth daily.     chlorpheniramine-HYDROcodone (TUSSIONEX) 10-8 MG/5ML SUER Take 5 mLs by mouth at bedtime as needed for cough. (Patient not taking: Reported on 40/0/8676) 195 mL 0   folic acid (FOLVITE) 1 MG tablet TAKE 1 TABLET BY MOUTH EVERY DAY 90 tablet 1   gabapentin (NEURONTIN) 300 MG capsule Take 1,200 mg by mouth 3 (three) times daily.      HYDROcodone-acetaminophen (NORCO) 10-325 MG tablet Take 1 tablet by mouth every 4 (four) hours as needed for moderate pain or severe pain.     nortriptyline (PAMELOR) 50 MG capsule Take 50 mg by mouth at bedtime.     omeprazole (PRILOSEC) 20 MG capsule Take 1 capsule (20 mg total) by mouth  daily. 30 capsule 3   Ostomy Supplies (PREMIER DRAINABLE POUCH 64MM) Pouch MISC      valsartan (DIOVAN) 80 MG tablet Take 80 mg by mouth daily.     No current facility-administered medications for this visit.   Facility-Administered Medications Ordered in Other Visits  Medication Dose Route Frequency Provider Last Rate Last Admin   heparin lock flush 100 UNIT/ML  injection             VITAL SIGNS: There were no vitals taken for this visit. There were no vitals filed for this visit.  Estimated body mass index is 28.35 kg/m as calculated from the following:   Height as of 08/10/21: 5\' 7"  (1.702 m).   Weight as of 08/10/21: 181 lb (82.1 kg).  LABS: CBC:    Component Value Date/Time   WBC 5.5 08/10/2021 1302   HGB 12.6 08/10/2021 1302   HCT 38.1 08/10/2021 1302   PLT 261 08/10/2021 1302   MCV 91.8 08/10/2021 1302   NEUTROABS 3.8 08/10/2021 1302   LYMPHSABS 1.0 08/10/2021 1302   MONOABS 0.5 08/10/2021 1302   EOSABS 0.1 08/10/2021 1302   BASOSABS 0.0 08/10/2021 1302   Comprehensive Metabolic Panel:    Component Value Date/Time   NA 137 08/10/2021 1302   K 3.5 08/10/2021 1302   CL 102 08/10/2021 1302   CO2 27 08/10/2021 1302   BUN 8 08/10/2021 1302   CREATININE 0.80 11/02/2021 1253   GLUCOSE 104 (H) 08/10/2021 1302   CALCIUM 8.7 (L) 08/10/2021 1302   AST 14 (L) 08/10/2021 1302   ALT 9 08/10/2021 1302   ALKPHOS 90 08/10/2021 1302   BILITOT 0.4 08/10/2021 1302   PROT 6.6 08/10/2021 1302   ALBUMIN 3.6 08/10/2021 1302    RADIOGRAPHIC STUDIES: CT CHEST ABDOMEN PELVIS W CONTRAST  Result Date: 11/04/2021 CLINICAL DATA:  Colorectal cancer, assess treatment response EXAM: CT CHEST, ABDOMEN, AND PELVIS WITH CONTRAST TECHNIQUE: Multidetector CT imaging of the chest, abdomen and pelvis was performed following the standard protocol during bolus administration of intravenous contrast. RADIATION DOSE REDUCTION: This exam was performed according to the departmental dose-optimization  program which includes automated exposure control, adjustment of the mA and/or kV according to patient size and/or use of iterative reconstruction technique. CONTRAST:  126mL OMNIPAQUE IOHEXOL 300 MG/ML SOLN additional oral enteric contrast COMPARISON:  PET-CT, 06/26/2021, CT chest, 10/31/2020 FINDINGS: CT CHEST FINDINGS Cardiovascular: Right chest port catheter. Aortic atherosclerosis. Normal heart size. Three-vessel coronary artery calcifications. Slight interval increase in a small pericardial effusion. Mediastinum/Nodes: No enlarged mediastinal, hilar, or axillary lymph nodes. Small calcified subcarinal and bilateral hilar lymph nodes. Thyroid gland, trachea, and esophagus demonstrate no significant findings. Lungs/Pleura: Redemonstrated post treatment appearance of the left lower lobe, with a dense, bandlike consolidation centrally. There are numerous new and enlarged bilateral pulmonary nodules, for example the largest nodule of the medial segment right middle lobe measuring 1.2 x 1.1 cm, previously 0.9 x 0.8 cm (series 3, image 99). New nodule of the medial right lower lobe in the azygoesophageal recess measuring 0.4 cm (series 3, image 113). New subpleural nodule of the dependent left lower lobe measuring 0.8 x 0.6 cm (series 3, image 109). No pleural effusion or pneumothorax. Musculoskeletal: Increased bony destruction of the posterior left eighth rib with associated soft tissue mass, increased in size compared to prior PET-CT, measuring approximately 4.0 x 1.9 cm, previously 2.7 x 2.1 cm when measured similarly (series 2, image 36). CT ABDOMEN PELVIS FINDINGS Hepatobiliary: Unchanged post treatment appearance of a hypoenhancing lesion of the central liver, hepatic segment IVA, measuring 3.1 x 2.3 cm (series 2, image 52). Scattered parenchymal calcifications throughout in keeping with prior granulomatous infection. No gallstones, gallbladder wall thickening, or biliary dilatation. Pancreas: Unremarkable. No  pancreatic ductal dilatation or surrounding inflammatory changes. Spleen: Normal in size. Parenchymal calcifications in keeping with prior granulomatous infection. Adrenals/Urinary Tract: Adrenal glands are unremarkable. Kidneys  are normal, without renal calculi, solid lesion, or hydronephrosis. Bladder is unremarkable. Stomach/Bowel: Stomach is within normal limits. Appendix is not clearly visualized. Status post abdominoperineal resection with left lower quadrant end colostomy. Unchanged postoperative/post treatment appearance of the pelvis, with presacral soft tissue thickening and scattered areas of partially calcified fat necrosis (series 2, image 105). Vascular/Lymphatic: Aortic atherosclerosis. Minimal enlargement of an infrarenal abdominal aortic aneurysm with eccentric mural thrombus, measuring 4.1 x 4.1 cm, previously 3.9 x 3.8 cm (series 2, image 77). No enlarged abdominal or pelvic lymph nodes. Reproductive: Status post hysterectomy Other: Left lower quadrant parastomal hernia containing fat and colon. No ascites. Musculoskeletal: No acute osseous findings. Redemonstrated sclerotic fractures of the bilateral sacral ala (series 2, image 96). IMPRESSION: 1. Numerous new and enlarged bilateral pulmonary nodules, consistent with worsened pulmonary metastatic disease. 2. Increased bony destruction of the posterior left eighth rib with associated soft tissue mass, increased in size compared to prior PET-CT, consistent with a worsened chest wall metastasis. 3. Unchanged post treatment appearance of a hypoenhancing metastatic lesion of the central liver. No new liver lesions. 4. Status post abdominoperineal resection with left lower quadrant end colostomy and parastomal hernia. Unchanged postoperative/post treatment appearance of the pelvis. 5. Slight interval increase in a small pericardial effusion. 6. Coronary artery disease. 7. Minimal enlargement of an infrarenal abdominal aortic aneurysm with eccentric  mural thrombus, measuring 4.1 x 4.1 cm, previously 3.9 x 3.8 cm. Recommend follow-up every 12 months and vascular consultation. This recommendation follows ACR consensus guidelines: White Paper of the ACR Incidental Findings Committee II on Vascular Findings. J Am Coll Radiol 2013; 10:789-794. Electronically Signed   By: Delanna Ahmadi M.D.   On: 11/04/2021 07:33    PERFORMANCE STATUS (ECOG) : 1 - Symptomatic but completely ambulatory  Review of Systems Unless otherwise noted, a complete review of systems is negative.  Physical Exam General: NAD Pulmonary: Unlabored Extremities: no edema, no joint deformities Skin: no rashes Neurological: Weakness but otherwise nonfocal  IMPRESSION: Patient was an add-on to my clinic schedule per request of Dr. Rogue Bussing.  Unfortunately, imaging shows clear disease progression.  Dr. Rogue Bussing has discussed option of restarting chemotherapy but patient is undecided.  She is fearful of developing alopecia, although she tolerated last chemotherapy reasonably well and I would likely be the same regimen.  Patient requested 3 months to decide on whether to pursue treatment but agreed on 85-month follow-up with Dr. Rogue Bussing.  I will plan to reach out in 1 month via telephone.  Note the patient has had significant loss recently with the death of her husband and dog.  PLAN: -Patient will benefit from ACP conversation -Follow-up telephone visit 1 month   Patient expressed understanding and was in agreement with this plan. She also understands that She can call the clinic at any time with any questions, concerns, or complaints.     Time Total: 15 minutes  Visit consisted of counseling and education dealing with the complex and emotionally intense issues of symptom management and palliative care in the setting of serious and potentially life-threatening illness.Greater than 50%  of this time was spent counseling and coordinating care related to the above  assessment and plan.  Signed by: Altha Harm, PhD, NP-C

## 2021-11-09 NOTE — Assessment & Plan Note (Addendum)
Stage IV rectal carcinoma: with History of  lung/liver metastases-s/p SBRT-left posterior chest wall lesion [July 09, 2021 finished].  Restaging CT scan- JAN 2023 shows progressive disease in bilateral lungs; and also increasing expansile size of the left rib lesion; no progression of disease in the liver.  #Had a long discussion with the patient regarding the concerning findings noted on the CT scan-for progression of disease.  Recommend systemic chemotherapy on a palliative basis.  Patient is interested but would recommend proceeding with chemotherapy for now.  She states that she will need some time; and wants to return back to [place] in about 2 months-and then proceed with chemotherapy if she is still interested.  I discussed my concerns regarding progressive disease/worsening symptoms; patient reluctant with following up with [place] anytime sooner.  #Patient wants to avoid hair loss;  I discussed that FOLFOX chemotherapy is given every 2 weeks; discuss the potential side effects including but not limited to nausea vomiting diarrhea, sores in the mouth, hand-foot syndrome; also tingling and numbness/cold sensitivity with oxaliplatin. Add avastin with cycle #2.   #Pain control: [Dr.Chigdy; [facility]-pain clinic]-hydrocodone 10 every 5-6 hours [as per patient; for her back pain]; currently with left posterior chest wall metastatic lesion-s/p SBRT-interestingly no significant response noted.Marland Kitchen STABLE.   #Infrarenal AAA- g 4.1 x 4.1 cm, previously 3.9 x 3.8 cm; I reviewed the recent evaluation with vascular surgery at [facility].  No recommendation for surgery unless greater than 5 cm.  Obviously any surgical options to be in the context of her metastatic colorectal cancer/and her interest with therapies.  #Incidental findings on CT  Imaging dated:JAn 2023- AAA; coronary artery disease atherosclerosis etc.  I reviewed/discussed/counseled the patient.   # port malfunction: s/p port study-positive for. fibrin sheath recommend  tPA.   # DISPOSITION: # will need TPA  # follow up- MD; in 2 months- labs- cbc/cmp/CEA; port flush; FOLFOX-;Dr.B  # I reviewed the blood work- with the patient in detail; also reviewed the imaging independently [as summarized above]; and with the patient in detail.

## 2021-11-09 NOTE — Progress Notes (Signed)
Results of scan done last week.  Concerns with port being blocked.

## 2021-11-09 NOTE — Progress Notes (Signed)
DISCONTINUE ON PATHWAY REGIMEN - Colorectal     A cycle is every 14 days:     Bevacizumab-xxxx      Irinotecan      Leucovorin      Fluorouracil      Fluorouracil   **Always confirm dose/schedule in your pharmacy ordering system**  REASON: Toxicities / Adverse Event PRIOR TREATMENT: [id number]: FOLFIRI + Bevacizumab q14 Days TREATMENT RESPONSE: Unable to Evaluate  START ON PATHWAY REGIMEN - Colorectal     A cycle is every 14 days:     Bevacizumab-xxxx      Oxaliplatin      Leucovorin      Fluorouracil      Fluorouracil   **Always confirm dose/schedule in your pharmacy ordering system**  Patient Characteristics: Distant Metastases, Nonsurgical Candidate, KRAS/NRAS Mutation Positive/Unknown (BRAF V600 Wild-Type/Unknown), Standard Cytotoxic Therapy, First Line Standard Cytotoxic Therapy, Bevacizumab Eligible, PS = 0,1 Tumor Location: Rectal Therapeutic Status: Distant Metastases Microsatellite/Mismatch Repair Status: MSS/pMMR BRAF Mutation Status: Wild-Type (no mutation) KRAS/NRAS Mutation Status: Mutation Positive Standard Cytotoxic Line of Therapy: First Line Standard Cytotoxic Therapy ECOG Performance Status: 1 Bevacizumab Eligibility: Eligible Intent of Therapy: Non-Curative / Palliative Intent, Discussed with Patient

## 2021-11-09 NOTE — Telephone Encounter (Signed)
Patient will need a cathflo on port-a-cath.  Not able to accommodate add on cathflo in infusion today.  Patient will need to be scheduled for cathflow and MD would like it to be scheduled in the next 1-2 weeks.  Please schedule and inform patient of appt details, patient is aware this is being scheduled.

## 2021-11-10 LAB — CEA: CEA: 1577 ng/mL — ABNORMAL HIGH (ref 0.0–4.7)

## 2021-11-15 ENCOUNTER — Inpatient Hospital Stay: Payer: Medicare HMO

## 2021-11-15 ENCOUNTER — Other Ambulatory Visit: Payer: Self-pay

## 2021-11-15 VITALS — BP 141/70 | HR 94 | Temp 96.0°F | Resp 20

## 2021-11-15 DIAGNOSIS — C20 Malignant neoplasm of rectum: Secondary | ICD-10-CM | POA: Diagnosis not present

## 2021-11-15 DIAGNOSIS — Z95828 Presence of other vascular implants and grafts: Secondary | ICD-10-CM

## 2021-11-15 MED ORDER — HEPARIN SOD (PORK) LOCK FLUSH 100 UNIT/ML IV SOLN
500.0000 [IU] | Freq: Once | INTRAVENOUS | Status: AC
  Filled 2021-11-15: qty 5

## 2021-11-15 MED ORDER — HEPARIN SOD (PORK) LOCK FLUSH 100 UNIT/ML IV SOLN
INTRAVENOUS | Status: AC
  Administered 2021-11-15: 500 [IU] via INTRAVENOUS
  Filled 2021-11-15: qty 5

## 2021-11-15 MED ORDER — ALTEPLASE 2 MG IJ SOLR
2.0000 mg | Freq: Once | INTRAMUSCULAR | Status: AC
  Administered 2021-11-15: 2 mg
  Filled 2021-11-15: qty 2

## 2021-11-15 MED ORDER — SODIUM CHLORIDE 0.9% FLUSH
10.0000 mL | INTRAVENOUS | Status: DC | PRN
  Administered 2021-11-15: 10 mL via INTRAVENOUS
  Filled 2021-11-15: qty 10

## 2021-12-05 ENCOUNTER — Telehealth: Payer: Self-pay | Admitting: *Deleted

## 2021-12-05 NOTE — Telephone Encounter (Signed)
Patient needs to reschedule appointment on 3/6 with Josh Borders she has a conflict. ?

## 2021-12-05 NOTE — Telephone Encounter (Signed)
Done

## 2021-12-06 ENCOUNTER — Encounter: Payer: Self-pay | Admitting: Internal Medicine

## 2021-12-06 ENCOUNTER — Encounter: Payer: Self-pay | Admitting: Hematology and Oncology

## 2021-12-10 ENCOUNTER — Telehealth: Payer: Medicare HMO | Admitting: Hospice and Palliative Medicine

## 2021-12-12 ENCOUNTER — Other Ambulatory Visit: Payer: Self-pay

## 2021-12-12 ENCOUNTER — Inpatient Hospital Stay: Payer: Medicare HMO | Attending: Hospice and Palliative Medicine | Admitting: Hospice and Palliative Medicine

## 2021-12-12 DIAGNOSIS — C7802 Secondary malignant neoplasm of left lung: Secondary | ICD-10-CM

## 2021-12-12 DIAGNOSIS — Z515 Encounter for palliative care: Secondary | ICD-10-CM

## 2021-12-12 NOTE — Progress Notes (Signed)
Virtual Visit via Telephone Note ? ?I connected with [name] on 12/12/21 at  1:00 PM EST by telephone and verified that I am speaking with the correct person using two identifiers. ? ?Location: ?Patient: Home ?Provider: Clinic ?  ?I discussed the limitations, risks, security and privacy concerns of performing an evaluation and management service by telephone and the availability of in person appointments. I also discussed with the patient that there may be a patient responsible charge related to this service. The patient expressed understanding and agreed to proceed. ? ? ?History of Present Illness: ? Tamara Hall is a 66 y.o. 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  Patient is status post RT but had previously declined chemotherapy.  CTs on 11/02/2021 revealed numerous new enlarged bilateral pulmonary nodules, increased bony destruction/chest wall metastasis and stable liver metastasis.  Palliative care was consulted to address goals.  ? ?Observations/Objective: ?I called and spoke with patient by phone.  She reports that she is doing reasonably well.  She recently saw the pain clinic and was rotated from [place] to oral hydromorphone.  However, she just started that and says that she cannot yet tell any difference. ? ?Patient denies other symptomatic complaints. ? ?Patient says that she is still undecided on whether to pursue chemotherapy.  She has talked to family and vacillates frequently on whether treatment would be right for her. ? ?Emotional support provided. ? ?Assessment and Plan: ?Stage IV rectal cancer -patient still undecided about treatment.  We will plan follow-up next month in clinic. ? ?Follow Up Instructions: ?RTC 1 month ?  ?I discussed the assessment and treatment plan with the patient. The patient was provided an  opportunity to ask questions and all were answered. The patient agreed with the plan and demonstrated an understanding of the instructions. ?  ?The patient was advised to call back or seek an in-person evaluation if the symptoms worsen or if the condition fails to improve as anticipated. ? ?I provided 10 minutes of non-face-to-face time during this encounter. ? ? ?Irean Hong, NP ? ? ?

## 2022-01-03 ENCOUNTER — Ambulatory Visit (INDEPENDENT_AMBULATORY_CARE_PROVIDER_SITE_OTHER): Payer: Medicare HMO | Admitting: Pulmonary Disease

## 2022-01-03 ENCOUNTER — Encounter: Payer: Self-pay | Admitting: Pulmonary Disease

## 2022-01-03 VITALS — BP 118/70 | HR 94 | Temp 98.8°F | Ht 67.0 in | Wt 183.2 lb

## 2022-01-03 DIAGNOSIS — F17210 Nicotine dependence, cigarettes, uncomplicated: Secondary | ICD-10-CM | POA: Diagnosis not present

## 2022-01-03 DIAGNOSIS — R0602 Shortness of breath: Secondary | ICD-10-CM

## 2022-01-03 DIAGNOSIS — C19 Malignant neoplasm of rectosigmoid junction: Secondary | ICD-10-CM | POA: Diagnosis not present

## 2022-01-03 DIAGNOSIS — J449 Chronic obstructive pulmonary disease, unspecified: Secondary | ICD-10-CM

## 2022-01-03 MED ORDER — STIOLTO RESPIMAT 2.5-2.5 MCG/ACT IN AERS: 2.5000 ug | INHALATION_SPRAY | Freq: Two times a day (BID) | RESPIRATORY_TRACT | 0 refills | Status: DC

## 2022-01-03 MED ORDER — BENZONATATE 200 MG PO CAPS: 200.0000 mg | ORAL_CAPSULE | Freq: Three times a day (TID) | ORAL | 1 refills | Status: DC | PRN

## 2022-01-03 NOTE — Progress Notes (Signed)
Subjective:    Patient ID: Tamara Hall, female    DOB: [birth year], 66 y.o.   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 She follows here for the issue of COPD with asthmatic bronchitic component. We previously had treated her with Judithann Sauger which she felt helped her with her symptoms however did cause her to have issues with "sores" in her mouth. This despite rinsing aggressively after use. She also has difficulties with breath-holding capacity. She has not had any recent fevers, chills or sweats. She continues to smoke anywhere between 2 to 10 cigarettes a day. She continues to follow-up with oncology for her rectal cancer issues. She has not had increasing shortness of breath over her baseline. She has had cough mostly in the mornings productive of whitish sputum. She does not endorse any other complaint today. Weight and appetite have been stable.  She is contemplating entering into hospice care.  There are no other therapies to be explored with regards to her stage IV cancer.  Review of Systems A 10 point review of systems was performed and it is as noted above otherwise negative.  Patient Active Problem List   Diagnosis Date Noted   Palliative care encounter    Acute respiratory failure with hypoxia ([place]) 06/07/2021   Stage 3 severe COPD by GOLD classification ([place]) 11/16/2020   Shortness of breath 09/10/2020    Ground glass opacity present on imaging of lung 08/30/2020   Multifocal pneumonia 08/30/2020   Gastroesophageal reflux disease with esophagitis 06/04/2020   Folate deficiency 12/04/2019   B12 deficiency 11/15/2019   H/O small bowel obstruction 10/29/2019   Pain in pelvis 10/28/2019   Partial small bowel obstruction ([place]) 08/13/2019   Diarrhea 07/11/2017   Hypokalemia 07/08/2017   Malignant neoplasm metastatic to left lung ([place]) 05/27/2017   Nausea without vomiting 05/06/2017   Malignant neoplasm metastatic to liver ([place]) 02/25/2017   Goals of care, counseling/discussion 02/18/2017   Encounter for antineoplastic chemotherapy 02/18/2017   Cancer related pain 02/16/2017   Tobacco use 02/06/2017   Abnormal findings-gastrointestinal tract    Rectum neoplasm    Rectal bleeding 01/13/2017   Rectal cancer ([place])    Social History   Tobacco Use   Smoking status: Every Day    Packs/day: 2.00    Years: 40.00    Additional pack years: 0.00    Total pack years: 80.00    Types: Cigarettes    Last attempt to quit: 07/25/2021        Smokeless tobacco: Never  Substance Use Topics   Alcohol use: No   No Known Allergies  Current Meds  Medication Sig   albuterol (VENTOLIN HFA) 108 (90 Base) MCG/ACT inhaler Inhale 1-2 puffs into the lungs every 6 (six) hours as needed for wheezing or shortness of breath.    baclofen (LIORESAL) 10 MG tablet Take 10 mg by mouth 3 (three) times daily.   gabapentin (NEURONTIN) 300 MG capsule Take 1,200 mg by mouth 3 (three) times daily.  Ostomy Supplies (PREMIER DRAINABLE POUCH 64MM) Pouch MISC    [DISCONTINUED] amLODipine (NORVASC) 5 MG tablet Take 5 mg by mouth daily.   folic acid (FOLVITE) 1 MG tablet TAKE 1 TABLET BY MOUTH EVERY DAY   HYDROcodone-acetaminophen (NORCO) 10-325 MG tablet Take 1 tablet by mouth every 4 (four) hours as needed for moderate pain or severe pain. (Patient not taking: Reported on 04/16/2022)   nortriptyline (PAMELOR) 50 MG capsule  Take 50 mg by mouth at bedtime.   omeprazole (PRILOSEC) 20 MG capsule Take 1 capsule (20 mg total) by mouth daily.   valsartan (DIOVAN) 80 MG tablet Take 80 mg by mouth daily.   zolpidem (AMBIEN) 5 MG tablet Take 5 mg by mouth at bedtime as needed.   Immunization History  Administered Date(s) Administered   DTaP 09/10/1996   Influenza,inj,Quad PF,6+ Mos 08/05/2017, 07/08/2018, 07/21/2019, 08/07/2020   Influenza-Unspecified 08/05/2017, 08/07/2020, 07/21/2021   Moderna Sars-Covid-2 Vaccination 08/28/2020, 09/26/2020, 05/08/2021   Pneumococcal Conjugate-13 10/17/2020   Pneumococcal Polysaccharide-23 02/06/2017       Objective:   Physical Exam BP 118/70 (BP Location: Left Arm, Patient Position: Sitting, Cuff Size: Normal)   Pulse 94   Temp 98.8 F (37.1 C) (Oral)   Ht 5\' 7"  (1.702 m)   Wt 183 lb 3.2 oz (83.1 kg)   SpO2 96%   BMI 28.69 kg/m   SpO2: 96 % O2 Device: None (Room air)  GENERAL: Chronically ill-appearing woman woman in no acute distress.  Occasional dry cough noted.  Uses walker for ambulation.  Raspy voice. HEAD: Normocephalic, atraumatic.  EYES: Pupils equal, round, reactive to light.  No scleral icterus.  MOUTH: Nose/mouth/throat not examined due to masking requirements for COVID 19. NECK: Supple. No thyromegaly. Trachea midline. No JVD.  No adenopathy. PULMONARY: Good air entry bilaterally.  She has scattered rhonchi and end expiratory wheezes throughout.   CARDIOVASCULAR: S1 and S2. Regular rate and rhythm.  No rubs, murmurs or gallops heard. ABDOMEN: Benign. MUSCULOSKELETAL: No joint deformity, no clubbing, no edema.  NEUROLOGIC: No focal deficits, speech is fluent.  Gait assisted with walker. SKIN: Intact,warm,dry.  PSYCH: Mood and behavior normal      Assessment & Plan:     ICD-10-CM   1. Stage 3 severe COPD by GOLD classification ([place])  J44.9    Trial of Stiolto 2 puffs daily Continue as needed albuterol Chronic cough secondary to same    2.  Shortness of breath  R06.02    Multifactorial: Poorly compensated COPD Stage IV cancer Debility/deconditioning    3. Colorectal cancer, stage IV ([place])  C19    This issue adds complexity to her management Entering palliative care    4. Tobacco dependence due to cigarettes  F17.210    Patient counseled regards to discontinuation of smoking     Meds ordered this encounter  Medications   Tiotropium Bromide-Olodaterol (STIOLTO RESPIMAT) 2.5-2.5 MCG/ACT AERS    Sig: Inhale 2.5 mcg into the lungs 2 (two) times daily.    Dispense:  4 g    Refill:  0    Order Specific Question:   Lot Number?    Answer:   [id number]    Order Specific Question:   Expiration Date?    Answer:   04/05/2024    Order Specific Question:   Quantity    Answer:   2   benzonatate (TESSALON) 200 MG capsule    Sig: Take 1 capsule (200 mg total) by mouth 3 (three) times daily as needed for cough.  Dispense:  20 capsule    Refill:  1   Finding the patient with samples of Stiolto to see if this helps her shortness of breath and cough.  She is entering palliative care.  Provided her with Tessalon to assist with management of her cough.  Follow-up in 2 months time, call sooner should any new problems arise.  Renold Don, MD Advanced Bronchoscopy PCCM [facility]-    *This note was dictated using voice recognition software/Dragon.  Despite best efforts to proofread, errors can occur which can change the meaning. Any transcriptional errors that result from this process are unintentional and may not be fully corrected at the time of dictation.

## 2022-01-03 NOTE — Patient Instructions (Signed)
We are giving you a sample of Stiolto this is 2 puffs once a day. ? ?Let us know how the Stiolto does for you. ? ?We are also sending in a prescription for Tessalon [place].  This should help with the cough. ? ?We will see you in follow-up in 2 months time call sooner should any new problems arise. ? ? ? ? ? ?

## 2022-01-07 NOTE — Progress Notes (Signed)
Pharmacist Chemotherapy Monitoring - Initial Assessment   ? ?Anticipated start date: 01/14/22  ? ?The following has been reviewed per standard work regarding the patient's treatment regimen: ?The patient's diagnosis, treatment plan and drug doses, and organ/hematologic function ?Lab orders and baseline tests specific to treatment regimen  ?The treatment plan start date, drug sequencing, and pre-medications ?Prior authorization status  ?Patient's documented medication list, including drug-drug interaction screen and prescriptions for anti-emetics and supportive care specific to the treatment regimen ?The drug concentrations, fluid compatibility, administration routes, and timing of the medications to be used ?The patient's access for treatment and lifetime cumulative dose history, if applicable  ?The patient's medication allergies and previous infusion related reactions, if applicable  ? ?Changes made to treatment plan:  ?treatment plan date ? ?Follow up needed:  ?signing treatment plan ? ? ?Adelina Mings, [facility], ?01/07/2022  1:42 PM  ?

## 2022-01-11 MED FILL — Dexamethasone Sodium Phosphate Inj 100 MG/10ML: INTRAMUSCULAR | Qty: 1 | Status: AC

## 2022-01-14 ENCOUNTER — Inpatient Hospital Stay: Payer: Medicare HMO | Attending: Internal Medicine

## 2022-01-14 ENCOUNTER — Inpatient Hospital Stay: Payer: Medicare HMO

## 2022-01-14 ENCOUNTER — Inpatient Hospital Stay (HOSPITAL_BASED_OUTPATIENT_CLINIC_OR_DEPARTMENT_OTHER): Payer: Medicare HMO | Admitting: Hospice and Palliative Medicine

## 2022-01-14 ENCOUNTER — Inpatient Hospital Stay (HOSPITAL_BASED_OUTPATIENT_CLINIC_OR_DEPARTMENT_OTHER): Payer: Medicare HMO | Admitting: Internal Medicine

## 2022-01-14 ENCOUNTER — Encounter: Payer: Self-pay | Admitting: Internal Medicine

## 2022-01-14 DIAGNOSIS — C787 Secondary malignant neoplasm of liver and intrahepatic bile duct: Secondary | ICD-10-CM | POA: Insufficient documentation

## 2022-01-14 DIAGNOSIS — R0789 Other chest pain: Secondary | ICD-10-CM | POA: Diagnosis not present

## 2022-01-14 DIAGNOSIS — C7802 Secondary malignant neoplasm of left lung: Secondary | ICD-10-CM

## 2022-01-14 DIAGNOSIS — J449 Chronic obstructive pulmonary disease, unspecified: Secondary | ICD-10-CM | POA: Insufficient documentation

## 2022-01-14 DIAGNOSIS — Z87891 Personal history of nicotine dependence: Secondary | ICD-10-CM | POA: Insufficient documentation

## 2022-01-14 DIAGNOSIS — C7989 Secondary malignant neoplasm of other specified sites: Secondary | ICD-10-CM | POA: Diagnosis not present

## 2022-01-14 DIAGNOSIS — C20 Malignant neoplasm of rectum: Secondary | ICD-10-CM

## 2022-01-14 DIAGNOSIS — Z79899 Other long term (current) drug therapy: Secondary | ICD-10-CM | POA: Diagnosis not present

## 2022-01-14 DIAGNOSIS — C7800 Secondary malignant neoplasm of unspecified lung: Secondary | ICD-10-CM | POA: Diagnosis not present

## 2022-01-14 DIAGNOSIS — Z515 Encounter for palliative care: Secondary | ICD-10-CM | POA: Diagnosis not present

## 2022-01-14 LAB — CBC WITH DIFFERENTIAL/PLATELET
Abs Immature Granulocytes: 0.03 10*3/uL (ref 0.00–0.07)
Basophils Absolute: 0 10*3/uL (ref 0.0–0.1)
Basophils Relative: 0 %
Eosinophils Absolute: 0.1 10*3/uL (ref 0.0–0.5)
Eosinophils Relative: 1 %
HCT: 41.5 % (ref 36.0–46.0)
Hemoglobin: 13.5 g/dL (ref 12.0–15.0)
Immature Granulocytes: 0 %
Lymphocytes Relative: 14 %
Lymphs Abs: 1.1 10*3/uL (ref 0.7–4.0)
MCH: 29.5 pg (ref 26.0–34.0)
MCHC: 32.5 g/dL (ref 30.0–36.0)
MCV: 90.8 fL (ref 80.0–100.0)
Monocytes Absolute: 0.7 10*3/uL (ref 0.1–1.0)
Monocytes Relative: 9 %
Neutro Abs: 6 10*3/uL (ref 1.7–7.7)
Neutrophils Relative %: 76 %
Platelets: 280 10*3/uL (ref 150–400)
RBC: 4.57 MIL/uL (ref 3.87–5.11)
RDW: 12.9 % (ref 11.5–15.5)
WBC: 7.9 10*3/uL (ref 4.0–10.5)
nRBC: 0 % (ref 0.0–0.2)

## 2022-01-14 LAB — COMPREHENSIVE METABOLIC PANEL
ALT: 9 U/L (ref 0–44)
AST: 15 U/L (ref 15–41)
Albumin: 3.7 g/dL (ref 3.5–5.0)
Alkaline Phosphatase: 93 U/L (ref 38–126)
Anion gap: 9 (ref 5–15)
BUN: 9 mg/dL (ref 8–23)
CO2: 28 mmol/L (ref 22–32)
Calcium: 8.7 mg/dL — ABNORMAL LOW (ref 8.9–10.3)
Chloride: 95 mmol/L — ABNORMAL LOW (ref 98–111)
Creatinine, Ser: 0.77 mg/dL (ref 0.44–1.00)
GFR, Estimated: >60 mL/min (ref >60)
Glucose, Bld: 98 mg/dL (ref 70–99)
Potassium: 3.8 mmol/L (ref 3.5–5.1)
Sodium: 132 mmol/L — ABNORMAL LOW (ref 135–145)
Total Bilirubin: 0.4 mg/dL (ref 0.3–1.2)
Total Protein: 7.1 g/dL (ref 6.5–8.1)

## 2022-01-14 MED ORDER — SODIUM CHLORIDE 0.9% FLUSH
10.0000 mL | INTRAVENOUS | Status: DC | PRN
  Administered 2022-01-14: 10 mL via INTRAVENOUS
  Filled 2022-01-14: qty 10

## 2022-01-14 MED ORDER — HEPARIN SOD (PORK) LOCK FLUSH 100 UNIT/ML IV SOLN
500.0000 [IU] | Freq: Once | INTRAVENOUS | Status: DC
  Administered 2022-01-14: 500 [IU] via INTRAVENOUS
  Filled 2022-01-14: qty 5

## 2022-01-14 NOTE — Progress Notes (Signed)
? ?  ?Palliative Medicine ?[facility]  ?[phone] [phone] ? ? ?Name: Tamara Hall ?Date: 01/14/2022 ?MRN: [id number]  ?DOB: 1956-03-06 ? ?Patient Care Team: ?Tamara Muse, MD as PCP - General (Internal Medicine) ?Stitzenberg, Tamara Lipps, MD as Referring Physician (Surgical Oncology) ?Tamara Asal, MD (Inactive) as Referring Physician (Hematology and Oncology) ?Tamara Pita, MD as Consulting Physician (Pulmonary Disease) ?Tamara Sickle, MD as Medical Oncologist (Internal Medicine)  ? ? ?REASON FOR CONSULTATION: ?Tamara Hall is a 66 y.o. 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  Patient is status post RT but had previously declined chemotherapy.  CTs on 11/02/2021 revealed numerous new enlarged bilateral pulmonary nodules, increased bony destruction/chest wall metastasis and stable liver metastasis.  Palliative care was consulted to address goals.  ? ?SOCIAL HISTORY:    ? reports that she quit smoking about 5 months ago. Her smoking use included cigarettes. She has a 60.00 pack-year smoking history. She has never used smokeless tobacco. She reports that she does not drink alcohol and does not use drugs. ? ?ADVANCE DIRECTIVES:  ?None on file ? ?CODE STATUS:  ? ?PAST MEDICAL HISTORY: ?Past Medical History:  ?Diagnosis Date  ? Cancer [facility])   ? colon, with mets to liver and left lung  ? Cough   ? Hypertension   ? Pneumonia   ? 20+ years ago  ? ? ?PAST SURGICAL HISTORY:  ?Past Surgical History:  ?Procedure Laterality Date  ? ABDOMINAL HYSTERECTOMY    ? BREAST SURGERY    ? COLON SURGERY  10/2017  ? [facility]  ? COLONOSCOPY WITH PROPOFOL N/A 01/14/2017  ? Procedure: COLONOSCOPY WITH PROPOFOL;  Surgeon: Lucilla Lame, MD;  Location: [facility] ENDOSCOPY;  Service: Endoscopy;  Laterality: N/A;  ?  ELBOW SURGERY Right   ? Has had 4 surgeries on right elbow.  ? HAND SURGERY Right 2008  ? IR CV LINE INJECTION  09/10/2021  ? JOINT REPLACEMENT    ? LIVER BIOPSY  10/2017  ? lung wedge resection  10/2017  ? PORTA CATH INSERTION N/A 01/29/2017  ? Procedure: Porta Cath Insertion;  Surgeon: Algernon Huxley, MD;  Location: [place] CV LAB;  Service: Cardiovascular;  Laterality: N/A;  ? TONSILLECTOMY    ? ? ?HEMATOLOGY/ONCOLOGY HISTORY:  ?Oncology History Overview Note  ?She received 6 cycles of FOLFOX (02/18/2017 - 04/22/2017).   ?            She underwent CT guided microwave ablation of the liver lesion on 06/06/2017.   ?            She received radiation and daily Xeloda (06/18/2017- 08/15/2017). ?            She underwent APR with descending colostomy and VRAM flap on 10/24/2017.  ?            She underwent a VATS wedge resection of her left lung. ?            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 ?                        She declined left lower lobe lobectomy. ?  She received 1 cycle of FOLFIRI (03/08/2020).   ?                        She declined further chemotherapy or surgery. ?            She completed SBRT to the left infrahilar nodule on 05/22/2020. ?            CEA was 40.9 on 06/13/2020 and 9.5 on 10/31/2020.  ?            Chest, abdomen, and pelvis CT on 10/31/2020 was personally reviewed.  Agree with radiology findings. ? ? ?# SEP 2022-left posterior chest wall recurrent disease status post SBRT [finished OCT, 23rd, 2022] CT scan- JAN 2023 shows progressive disease in bilateral lungs; and also increasing expansile size of the left rib lesion; no progression of disease in the liver. ?  ?Rectal cancer ([place])  ?01/13/2017 Initial Diagnosis  ? Rectal cancer [facility]) ?  ?03/08/2020 - 03/10/2020 Chemotherapy  ? Patient is on Treatment Plan : COLORECTAL FOLFIRI / BEVACIZUMAB Q14D  ?   ?01/14/2022 -  Chemotherapy  ? Patient is on Treatment Plan : COLORECTAL  FOLFOX + Bevacizumab q14d  ?   ? ? ?ALLERGIES:  has No Known Allergies. ? ?MEDICATIONS:  ?Current Outpatient Medications  ?Medication Sig Dispense Refill  ? albuterol (VENTOLIN HFA) 108 (90 Base) MCG/ACT inhaler Inhale 1-2 puffs into the lungs every 6 (six) hours as needed for wheezing or shortness of breath.     ? amLODipine (NORVASC) 5 MG tablet Take 5 mg by mouth daily.    ? baclofen (LIORESAL) 10 MG tablet Take 10 mg by mouth 3 (three) times daily.    ? benzonatate (TESSALON) 200 MG capsule Take 1 capsule (200 mg total) by mouth 3 (three) times daily as needed for cough. 20 capsule 1  ? Calcium Carbonate (CALCIUM 500 PO) Take 1 tablet by mouth daily.    ? folic acid (FOLVITE) 1 MG tablet TAKE 1 TABLET BY MOUTH EVERY DAY 90 tablet 1  ? gabapentin (NEURONTIN) 300 MG capsule Take 1,200 mg by mouth 3 (three) times daily.     ? HYDROcodone-acetaminophen (NORCO) 10-325 MG tablet Take 1 tablet by mouth every 4 (four) hours as needed for moderate pain or severe pain.    ? HYDROmorphone HCl (DILAUDID-5 PO) Take by mouth.    ? nortriptyline (PAMELOR) 50 MG capsule Take 50 mg by mouth at bedtime.    ? omeprazole (PRILOSEC) 20 MG capsule Take 1 capsule (20 mg total) by mouth daily. 30 capsule 3  ? Ostomy Supplies (PREMIER DRAINABLE POUCH [phone]) Pouch MISC     ? Tiotropium Bromide-Olodaterol (STIOLTO RESPIMAT) 2.5-2.5 MCG/ACT AERS Inhale 2.5 mcg into the lungs 2 (two) times daily. 4 g 0  ? valsartan (DIOVAN) 80 MG tablet Take 80 mg by mouth daily.    ? zolpidem (AMBIEN) 5 MG tablet Take 5 mg by mouth at bedtime as needed.    ? ?No current facility-administered medications for this visit.  ? ?Facility-Administered Medications Ordered in Other Visits  ?Medication Dose Route Frequency Provider Last Rate Last Admin  ? heparin lock flush 100 UNIT/ML injection           ? heparin lock flush 100 unit/mL  500 Units Intravenous Once Sindy Guadeloupe, MD      ? sodium chloride flush (NS) 0.9 % injection 10 mL  10 mL Intravenous PRN  Tamara Sickle, MD  10 mL at 01/14/22 0849  ? ? ?VITAL SIGNS: ?There were no vitals taken for this visit. ?There were no vitals filed for this visit.  ?Estimated body mass index is 29.08 kg/m? as calculated from the following: ?  Height as of an earlier encounter on 01/14/22: 5\' 7"  (1.702 m). ?  Weight as of an earlier encounter on 01/14/22: 185 lb 11.2 oz (84.2 kg). ? ?LABS: ?CBC: ?   ?Component Value Date/Time  ? WBC 7.9 01/14/2022 0844  ? HGB 13.5 01/14/2022 0844  ? HCT 41.5 01/14/2022 0844  ? PLT 280 01/14/2022 0844  ? MCV 90.8 01/14/2022 0844  ? NEUTROABS 6.0 01/14/2022 0844  ? LYMPHSABS 1.1 01/14/2022 0844  ? MONOABS 0.7 01/14/2022 0844  ? EOSABS 0.1 01/14/2022 0844  ? BASOSABS 0.0 01/14/2022 0844  ? ?Comprehensive Metabolic Panel: ?   ?Component Value Date/Time  ? NA 132 (L) 01/14/2022 0844  ? K 3.8 01/14/2022 0844  ? CL 95 (L) 01/14/2022 0844  ? CO2 28 01/14/2022 0844  ? BUN 9 01/14/2022 0844  ? CREATININE 0.77 01/14/2022 0844  ? GLUCOSE 98 01/14/2022 0844  ? CALCIUM 8.7 (L) 01/14/2022 0844  ? AST 15 01/14/2022 0844  ? ALT 9 01/14/2022 0844  ? ALKPHOS 93 01/14/2022 0844  ? BILITOT 0.4 01/14/2022 0844  ? PROT 7.1 01/14/2022 0844  ? ALBUMIN 3.7 01/14/2022 0844  ? ? ?RADIOGRAPHIC STUDIES: ?No results found. ? ?PERFORMANCE STATUS (ECOG) : 1 - Symptomatic but completely ambulatory ? ?Review of Systems ?Unless otherwise noted, a complete review of systems is negative. ? ?Physical Exam ?General: NAD ?Pulmonary: Unlabored ?Extremities: no edema, no joint deformities ?Skin: no rashes ?Neurological: Weakness but otherwise nonfocal ? ?IMPRESSION: ?Routine follow-up.  Patient was seen jointly with Dr. Rogue Bussing. ? ?Patient has decided not to pursue cancer treatment.  Instead, she is focused on comfort and quality of life.  She does want to continue all currently established medical care including pain clinic and follow-up appointments with [place].  However, she is also interested in proceeding with hospice  involvement at home.  We will send a hospice referral. ? ?PLAN: ?-Referral for hospice ?-Follow-up in 6 weeks ? ? ?Patient expressed understanding and was in agreement with this plan. She also understands that She can call

## 2022-01-14 NOTE — Progress Notes (Signed)
[facility] ?CONSULT NOTE ? ?Patient Care Team: ?Loni Muse, MD as PCP - General (Internal Medicine) ?Stitzenberg, Clint Lipps, MD as Referring Physician (Surgical Oncology) ?Lequita Asal, MD (Inactive) as Referring Physician (Hematology and Oncology) ?Tyler Pita, MD as Consulting Physician (Pulmonary Disease) ?Cammie Sickle, MD as Medical Oncologist (Internal Medicine) ? ?CHIEF COMPLAINTS/PURPOSE OF CONSULTATION: Rectal cancer metastatic ? ?#  ?Oncology History Overview Note  ?She received 6 cycles of FOLFOX (02/18/2017 - 04/22/2017).   ?            She underwent CT guided microwave ablation of the liver lesion on 06/06/2017.   ?            She received radiation and daily Xeloda (06/18/2017- 08/15/2017). ?            She underwent APR with descending colostomy and VRAM flap on 10/24/2017.  ?            She underwent a VATS wedge resection of her left lung. ?            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 ?                        She declined left lower lobe lobectomy. ?                        She received 1 cycle of FOLFIRI (03/08/2020).   ?                        She declined further chemotherapy or surgery. ?            She completed SBRT to the left infrahilar nodule on 05/22/2020. ?            CEA was 40.9 on 06/13/2020 and 9.5 on 10/31/2020.  ?            Chest, abdomen, and pelvis CT on 10/31/2020 was personally reviewed.  Agree with radiology findings. ? ? ?# SEP 2022-left posterior chest wall recurrent disease status post SBRT [finished OCT, 23rd, 2022] CT scan- JAN 2023 shows progressive disease in bilateral lungs; and also increasing expansile size of the left rib lesion; no progression of disease in the liver. ?  ?Rectal cancer ([place])  ?01/13/2017 Initial Diagnosis  ? Rectal cancer [facility]) ?  ?03/08/2020 - 03/10/2020 Chemotherapy  ? Patient is on Treatment Plan : COLORECTAL FOLFIRI / BEVACIZUMAB Q14D  ?   ?01/14/2022 -   Chemotherapy  ? Patient is on Treatment Plan : COLORECTAL FOLFOX + Bevacizumab q14d  ?   ? ? ? ?HISTORY OF PRESENTING ILLNESS: Patient alone.  Walks with a rolling walker. ?Tamara Hall 66 y.o.  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 ? ?Patient noted to have progressive disease in January 2023 on imaging.  However patient reluctant with starting chemotherapy.  ? ?Patient s/p SBRT to her left posterior eighth rib lesion approximately 3 months ago. Patient states that her chest wall pain has recently gotten worse.  She has been followed by [facility] pain clinic states that pain is better controlled now. ? ? ?Review of Systems  ?Constitutional:  Positive for malaise/fatigue. Negative for chills, diaphoresis, fever and weight loss.  ?     Left posterior chest wall pain.  ?HENT:  Negative for nosebleeds and sore throat.   ?Eyes:  Negative for double vision.  ?Respiratory:  Negative for cough, hemoptysis, sputum production, shortness of breath and wheezing.   ?Cardiovascular:  Negative for chest pain, palpitations, orthopnea and leg swelling.  ?Gastrointestinal:  Negative for abdominal pain, blood in stool, constipation, diarrhea, heartburn, melena, nausea and vomiting.  ?Genitourinary:  Negative for dysuria, frequency and urgency.  ?Musculoskeletal:  Positive for back pain and joint pain.  ?Skin: Negative.  Negative for itching and rash.  ?Neurological:  Negative for dizziness, tingling, focal weakness, weakness and headaches.  ?Endo/Heme/Allergies:  Does not bruise/bleed easily.  ?Psychiatric/Behavioral:  Negative for depression. The patient is not nervous/anxious and does not have insomnia.    ? ?MEDICAL HISTORY:  ?Past Medical History:  ?Diagnosis Date  ? Cancer [facility])   ? colon, with mets to liver and left lung  ? Cough   ? Hypertension   ? Pneumonia   ? 20+ years ago  ? ? ?SURGICAL HISTORY: ?Past Surgical History:  ?Procedure Laterality Date  ?  ABDOMINAL HYSTERECTOMY    ? BREAST SURGERY    ? COLON SURGERY  10/2017  ? [facility]  ? COLONOSCOPY WITH PROPOFOL N/A 01/14/2017  ? Procedure: COLONOSCOPY WITH PROPOFOL;  Surgeon: Lucilla Lame, MD;  Location: [facility] ENDOSCOPY;  Service: Endoscopy;  Laterality: N/A;  ? ELBOW SURGERY Right   ? Has had 4 surgeries on right elbow.  ? HAND SURGERY Right 2008  ? IR CV LINE INJECTION  09/10/2021  ? JOINT REPLACEMENT    ? LIVER BIOPSY  10/2017  ? lung wedge resection  10/2017  ? PORTA CATH INSERTION N/A 01/29/2017  ? Procedure: Porta Cath Insertion;  Surgeon: Algernon Huxley, MD;  Location: [facility];  Service: Cardiovascular;  Laterality: N/A;  ? TONSILLECTOMY    ? ? ?SOCIAL HISTORY: ?Social History  ? ?Socioeconomic History  ? Marital status: Divorced  ?  Spouse name: Not on file  ? Number of children: Not on file  ? Years of education: Not on file  ? Highest education level: Not on file  ?Occupational History  ? Not on file  ?Tobacco Use  ? Smoking status: Former  ?  Packs/day: 1.50  ?  Years: 40.00  ?  Pack years: 60.00  ?  Types: Cigarettes  ?  Quit date: 07/25/2021  ?  Years since quitting: 0.4  ? Smokeless tobacco: Never  ? Tobacco comments:  ?  QUIT SMOKING 6 MONTHS AGO  ?Vaping Use  ? Vaping Use: Never used  ?Substance and Sexual Activity  ? Alcohol use: No  ? Drug use: No  ? Sexual activity: Not on file  ?Other Topics Concern  ? Not on file  ?Social History Narrative  ? Not on file  ? ?Social Determinants of Health  ? ?Financial Resource Strain: Not on file  ?Food Insecurity: Not on file  ?Transportation Needs: Not on file  ?Physical Activity: Not on file  ?Stress: Not on file  ?Social Connections: Not on file  ?Intimate Partner Violence: Not on file  ? ? ?FAMILY HISTORY: ?Family History  ?Problem Relation Age of Onset  ? Lung cancer Mother 67  ? Esophageal cancer Father 25  ? Brain cancer Maternal Grandmother   ? ? ?ALLERGIES:  has No Known Allergies. ? ?MEDICATIONS:  ?Current Outpatient Medications  ?Medication Sig  Dispense Refill  ? albuterol (VENTOLIN HFA) 108 (90 Base) MCG/ACT inhaler Inhale 1-2 puffs into the lungs every 6 (six) hours as  needed for wheezing or shortness of breath.     ? amLODipine (NORVASC) 5 MG tablet Take 5 mg by mouth daily.    ? baclofen (LIORESAL) 10 MG tablet Take 10 mg by mouth 3 (three) times daily.    ? benzonatate (TESSALON) 200 MG capsule Take 1 capsule (200 mg total) by mouth 3 (three) times daily as needed for cough. 20 capsule 1  ? Calcium Carbonate (CALCIUM 500 PO) Take 1 tablet by mouth daily.    ? folic acid (FOLVITE) 1 MG tablet TAKE 1 TABLET BY MOUTH EVERY DAY 90 tablet 1  ? gabapentin (NEURONTIN) 300 MG capsule Take 1,200 mg by mouth 3 (three) times daily.     ? HYDROcodone-acetaminophen (NORCO) 10-325 MG tablet Take 1 tablet by mouth every 4 (four) hours as needed for moderate pain or severe pain.    ? HYDROmorphone HCl (DILAUDID-5 PO) Take by mouth.    ? nortriptyline (PAMELOR) 50 MG capsule Take 50 mg by mouth at bedtime.    ? omeprazole (PRILOSEC) 20 MG capsule Take 1 capsule (20 mg total) by mouth daily. 30 capsule 3  ? Ostomy Supplies (PREMIER DRAINABLE POUCH [phone]) Pouch MISC     ? Tiotropium Bromide-Olodaterol (STIOLTO RESPIMAT) 2.5-2.5 MCG/ACT AERS Inhale 2.5 mcg into the lungs 2 (two) times daily. 4 g 0  ? valsartan (DIOVAN) 80 MG tablet Take 80 mg by mouth daily.    ? zolpidem (AMBIEN) 5 MG tablet Take 5 mg by mouth at bedtime as needed.    ? ?No current facility-administered medications for this visit.  ? ?Facility-Administered Medications Ordered in Other Visits  ?Medication Dose Route Frequency Provider Last Rate Last Admin  ? heparin lock flush 100 UNIT/ML injection           ? ? ?  ?. ? ?PHYSICAL EXAMINATION: ?ECOG PERFORMANCE STATUS: 1 - Symptomatic but completely ambulatory ? ?Vitals:  ? 01/14/22 0854  ?BP: 135/77  ?Pulse: 96  ?Temp: (!) 96.5 ?F (35.8 ?C)  ?SpO2: 94%  ? ? ?Filed Weights  ? 01/14/22 0854  ?Weight: 185 lb 11.2 oz (84.2 kg)  ? ? ? ?Physical Exam ?Vitals  and nursing note reviewed.  ?HENT:  ?   Head: Normocephalic and atraumatic.  ?   Mouth/Throat:  ?   Pharynx: Oropharynx is clear.  ?Eyes:  ?   Extraocular Movements: Extraocular movements intact.  ?   Pupils: Pupils

## 2022-01-14 NOTE — Assessment & Plan Note (Addendum)
Stage IV rectal carcinoma: with History of  lung/liver metastases-s/p SBRT-left posterior chest wall lesion [July 09, 2021 finished].  Restaging CT scan- JAN 2023 shows progressive disease in bilateral lungs; and also increasing expansile size of the left rib lesion; no progression of disease in the liver. ? ?#Had a long discussion with the patient regarding the concerning findings noted on the Jan 2023-CT scan-for progression of disease.  Recommend systemic chemotherapy on a palliative basis.  However after much deliberation/reflection -patient declines chemotherapy.  Since the treatment are palliative, patient feels that treatments/potential side effects are not worth.  She is extremely concerned about the side effects.  ? ?#Pain control: [Dr.Chigdy; [facility]-pain clinic]-hydrocodone 10 every 5-6 hours [as per patient; for her back pain]; currently with left posterior chest wall metastatic lesion-s/p SBRT-interestingly no significant response noted; worse followed by [facility] pain. ? ?# COPD: Continue inhalers;  ? ?# port : port flush q 6-8 weeks ? ?# Given the incurable nature of the disease /poor tolerance of therapy and in general less than 6 months of life expectancy-I introduced hospice philosophy to the patient.  Patient's husband gone through hospice recently.  Discussed that goal of care should be directed to symptom management rather than treating the underlying disease; and in the process help improve quality of life rather than quantity.  Discussed with [employer].  As per patient preference we will continue to follow every few months. ? ? # DISPOSITION: ?# refer to hospice re: rectal canecr ?# De-access the MediPort; NO chemo today ?# follow up in 6 weeks - MD; - labs- cbc/cmp/CEA; port flush; -;Dr.B ?  ? ? ?

## 2022-01-15 LAB — CEA: CEA: 2861 ng/mL — ABNORMAL HIGH (ref 0.0–4.7)

## 2022-01-16 ENCOUNTER — Inpatient Hospital Stay: Payer: Medicare HMO

## 2022-01-16 ENCOUNTER — Other Ambulatory Visit: Payer: Self-pay | Admitting: Internal Medicine

## 2022-01-16 ENCOUNTER — Other Ambulatory Visit: Payer: Self-pay | Admitting: Pulmonary Disease

## 2022-01-16 ENCOUNTER — Other Ambulatory Visit: Payer: Self-pay | Admitting: Oncology

## 2022-01-16 DIAGNOSIS — K2100 Gastro-esophageal reflux disease with esophagitis, without bleeding: Secondary | ICD-10-CM

## 2022-01-17 ENCOUNTER — Encounter: Payer: Self-pay | Admitting: Hematology and Oncology

## 2022-01-17 ENCOUNTER — Encounter: Payer: Self-pay | Admitting: Internal Medicine

## 2022-01-18 ENCOUNTER — Telehealth: Payer: Self-pay | Admitting: *Deleted

## 2022-01-18 NOTE — Telephone Encounter (Signed)
Called and spoke with patient, I let her know we received a request for a refill on the Benzonatate tablets and wanted to know if she was still needing them.  She said they were starting to help, but there was only 1 refill and there was only enough for 2 weeks.  I let her I would send a message to Dr. Patsey Berthold and once we hear back from her we will call her back.  She verbalized understanding. ? ?Dr. Patsey Berthold. ?Patient states Benzonatate tablets were starting to work and was wondering if you would refill it?  Please advise.  Thank you. ?

## 2022-01-18 NOTE — Telephone Encounter (Signed)
We can certainly refill the [employer]  200 mg 1 cap 3 times daily as needed for cough may give #30, 1 refill. ?

## 2022-01-23 NOTE — Telephone Encounter (Signed)
Called and spoke with patient, provided recommendations per Dr. Patsey Berthold.  She stated that she had already picked up the prescription at the pharmacy.  She wanted to know if she should be good after taking the Tessalon for 2 weeks.  I advised her that if she is no better after finishing the Tessalon to contact the office to see how Dr. Patsey Berthold wants to proceed.  Her f/u is not until 6/8.  She verbalized understanding.  Nothing further needed. ?

## 2022-02-04 ENCOUNTER — Other Ambulatory Visit: Payer: Self-pay | Admitting: Internal Medicine

## 2022-02-13 ENCOUNTER — Encounter: Payer: Self-pay | Admitting: Internal Medicine

## 2022-02-13 ENCOUNTER — Encounter: Payer: Self-pay | Admitting: Hematology and Oncology

## 2022-02-27 ENCOUNTER — Inpatient Hospital Stay: Payer: Medicare HMO

## 2022-02-27 ENCOUNTER — Inpatient Hospital Stay: Payer: Medicare HMO | Admitting: Internal Medicine

## 2022-03-14 ENCOUNTER — Ambulatory Visit: Payer: Medicare HMO | Admitting: Pulmonary Disease

## 2022-03-19 ENCOUNTER — Inpatient Hospital Stay: Payer: Medicare HMO | Admitting: Internal Medicine

## 2022-03-19 ENCOUNTER — Inpatient Hospital Stay: Payer: Medicare HMO

## 2022-03-27 ENCOUNTER — Inpatient Hospital Stay: Payer: Medicare HMO

## 2022-03-27 ENCOUNTER — Inpatient Hospital Stay: Payer: Medicare HMO | Admitting: Internal Medicine

## 2022-04-03 ENCOUNTER — Inpatient Hospital Stay: Payer: Medicare HMO | Admitting: Internal Medicine

## 2022-04-03 ENCOUNTER — Inpatient Hospital Stay: Payer: Medicare HMO

## 2022-04-16 ENCOUNTER — Inpatient Hospital Stay: Payer: Medicare HMO | Attending: Internal Medicine

## 2022-04-16 ENCOUNTER — Encounter: Payer: Self-pay | Admitting: Internal Medicine

## 2022-04-16 ENCOUNTER — Inpatient Hospital Stay (HOSPITAL_BASED_OUTPATIENT_CLINIC_OR_DEPARTMENT_OTHER): Payer: Medicare HMO | Admitting: Internal Medicine

## 2022-04-16 DIAGNOSIS — C787 Secondary malignant neoplasm of liver and intrahepatic bile duct: Secondary | ICD-10-CM | POA: Insufficient documentation

## 2022-04-16 DIAGNOSIS — J449 Chronic obstructive pulmonary disease, unspecified: Secondary | ICD-10-CM | POA: Diagnosis not present

## 2022-04-16 DIAGNOSIS — Z87891 Personal history of nicotine dependence: Secondary | ICD-10-CM | POA: Diagnosis not present

## 2022-04-16 DIAGNOSIS — C20 Malignant neoplasm of rectum: Secondary | ICD-10-CM | POA: Diagnosis present

## 2022-04-16 DIAGNOSIS — Z95828 Presence of other vascular implants and grafts: Secondary | ICD-10-CM

## 2022-04-16 DIAGNOSIS — Z79899 Other long term (current) drug therapy: Secondary | ICD-10-CM | POA: Diagnosis not present

## 2022-04-16 DIAGNOSIS — R0789 Other chest pain: Secondary | ICD-10-CM | POA: Insufficient documentation

## 2022-04-16 DIAGNOSIS — M549 Dorsalgia, unspecified: Secondary | ICD-10-CM | POA: Diagnosis not present

## 2022-04-16 DIAGNOSIS — R059 Cough, unspecified: Secondary | ICD-10-CM | POA: Insufficient documentation

## 2022-04-16 DIAGNOSIS — C7800 Secondary malignant neoplasm of unspecified lung: Secondary | ICD-10-CM | POA: Diagnosis present

## 2022-04-16 LAB — COMPREHENSIVE METABOLIC PANEL
ALT: 10 U/L (ref 0–44)
AST: 18 U/L (ref 15–41)
Albumin: 3.3 g/dL — ABNORMAL LOW (ref 3.5–5.0)
Alkaline Phosphatase: 91 U/L (ref 38–126)
Anion gap: 9 (ref 5–15)
BUN: 7 mg/dL — ABNORMAL LOW (ref 8–23)
CO2: 28 mmol/L (ref 22–32)
Calcium: 8.4 mg/dL — ABNORMAL LOW (ref 8.9–10.3)
Chloride: 99 mmol/L (ref 98–111)
Creatinine, Ser: 0.67 mg/dL (ref 0.44–1.00)
GFR, Estimated: >60 mL/min (ref >60)
Glucose, Bld: 114 mg/dL — ABNORMAL HIGH (ref 70–99)
Potassium: 3.4 mmol/L — ABNORMAL LOW (ref 3.5–5.1)
Sodium: 136 mmol/L (ref 135–145)
Total Bilirubin: 0.4 mg/dL (ref 0.3–1.2)
Total Protein: 6.7 g/dL (ref 6.5–8.1)

## 2022-04-16 LAB — CBC WITH DIFFERENTIAL/PLATELET
Abs Immature Granulocytes: 0.06 10*3/uL (ref 0.00–0.07)
Basophils Absolute: 0 10*3/uL (ref 0.0–0.1)
Basophils Relative: 0 %
Eosinophils Absolute: 0.1 10*3/uL (ref 0.0–0.5)
Eosinophils Relative: 1 %
HCT: 42.2 % (ref 36.0–46.0)
Hemoglobin: 13.7 g/dL (ref 12.0–15.0)
Immature Granulocytes: 1 %
Lymphocytes Relative: 11 %
Lymphs Abs: 1 10*3/uL (ref 0.7–4.0)
MCH: 29.1 pg (ref 26.0–34.0)
MCHC: 32.5 g/dL (ref 30.0–36.0)
MCV: 89.6 fL (ref 80.0–100.0)
Monocytes Absolute: 0.6 10*3/uL (ref 0.1–1.0)
Monocytes Relative: 6 %
Neutro Abs: 7.5 10*3/uL (ref 1.7–7.7)
Neutrophils Relative %: 81 %
Platelets: 289 10*3/uL (ref 150–400)
RBC: 4.71 MIL/uL (ref 3.87–5.11)
RDW: 14.6 % (ref 11.5–15.5)
WBC: 9.3 10*3/uL (ref 4.0–10.5)
nRBC: 0 % (ref 0.0–0.2)

## 2022-04-16 MED ORDER — SODIUM CHLORIDE 0.9% FLUSH
10.0000 mL | Freq: Once | INTRAVENOUS | Status: AC
  Administered 2022-04-16: 10 mL via INTRAVENOUS
  Filled 2022-04-16: qty 10

## 2022-04-16 MED ORDER — HEPARIN SOD (PORK) LOCK FLUSH 100 UNIT/ML IV SOLN
500.0000 [IU] | Freq: Once | INTRAVENOUS | Status: AC
  Administered 2022-04-16: 500 [IU] via INTRAVENOUS
  Filled 2022-04-16: qty 5

## 2022-04-16 MED ORDER — HYDROCOD POLI-CHLORPHE POLI ER 10-8 MG/5ML PO SUER: 5.0000 mL | Freq: Two times a day (BID) | ORAL | 0 refills | Status: DC | PRN

## 2022-04-16 NOTE — Progress Notes (Unsigned)
[place] NOTE  Patient Care Team: Loni Muse, MD as PCP - General (Internal Medicine) Stitzenberg, Clint Lipps, MD as Referring Physician (Surgical Oncology) Lequita Asal, MD (Inactive) as Referring Physician (Hematology and Oncology) Tyler Pita, MD as Consulting Physician (Pulmonary Disease) Cammie Sickle, MD as Medical Oncologist (Internal Medicine)  CHIEF COMPLAINTS/PURPOSE OF CONSULTATION: Rectal cancer metastatic  #  Oncology History Overview Note  She received 6 cycles of FOLFOX (02/18/2017 - 04/22/2017).               She underwent CT guided microwave ablation of the liver lesion on 06/06/2017.               She received radiation and daily Xeloda (06/18/2017- 08/15/2017).             She underwent APR with descending colostomy and VRAM flap on 10/24/2017.              She underwent a VATS wedge resection of her left lung.             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                         She declined left lower lobe lobectomy.                         She received 1 cycle of FOLFIRI (03/08/2020).                           She declined further chemotherapy or surgery.             She completed SBRT to the left infrahilar nodule on 05/22/2020.             CEA was 40.9 on 06/13/2020 and 9.5 on 10/31/2020.              Chest, abdomen, and pelvis CT on 10/31/2020 was personally reviewed.  Agree with radiology findings.   # SEP 2022-left posterior chest wall recurrent disease status post SBRT [finished OCT, 23rd, 2022] CT scan- JAN 2023 shows progressive disease in bilateral lungs; and also increasing expansile size of the left rib lesion; no progression of disease in the liver.   Rectal cancer ([place])  01/13/2017 Initial Diagnosis   Rectal cancer ([place])   03/08/2020 - 03/10/2020 Chemotherapy   Patient is on Treatment Plan : COLORECTAL FOLFIRI / BEVACIZUMAB Q14D     01/14/2022 -   Chemotherapy   Patient is on Treatment Plan : COLORECTAL FOLFOX + Bevacizumab q14d        HISTORY OF PRESENTING ILLNESS: Patient alone.  Walks with a rolling walker. Tamara Hall 66 y.o.  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  Patient noted to have progressive disease in January 2023 on imaging.  However patient reluctant with starting chemotherapy.   Patient s/p SBRT to her left posterior eighth rib lesion approximately 3 months ago. Patient states that her chest wall pain has recently gotten worse.  She has been followed by [facility] pain clinic states that pain is better controlled now.   Review of Systems  Constitutional:  Positive for malaise/fatigue. Negative for chills, diaphoresis, fever and weight loss.       Left posterior chest wall pain.  HENT:  Negative for nosebleeds and sore throat.   Eyes:  Negative for double vision.  Respiratory:  Negative for cough, hemoptysis, sputum production, shortness of breath and wheezing.   Cardiovascular:  Negative for chest pain, palpitations, orthopnea and leg swelling.  Gastrointestinal:  Negative for abdominal pain, blood in stool, constipation, diarrhea, heartburn, melena, nausea and vomiting.  Genitourinary:  Negative for dysuria, frequency and urgency.  Musculoskeletal:  Positive for back pain and joint pain.  Skin: Negative.  Negative for itching and rash.  Neurological:  Negative for dizziness, tingling, focal weakness, weakness and headaches.  Endo/Heme/Allergies:  Does not bruise/bleed easily.  Psychiatric/Behavioral:  Negative for depression. The patient is not nervous/anxious and does not have insomnia.     MEDICAL HISTORY:  Past Medical History:  Diagnosis Date  . Cancer ([place])    colon, with mets to liver and left lung  . Cough   . Hypertension   . Pneumonia    20+ years ago    SURGICAL HISTORY: Past Surgical History:  Procedure Laterality Date   . ABDOMINAL HYSTERECTOMY    . BREAST SURGERY    . COLON SURGERY  10/2017   [facility]  . COLONOSCOPY WITH PROPOFOL N/A 01/14/2017   Procedure: COLONOSCOPY WITH PROPOFOL;  Surgeon: Lucilla Lame, MD;  Location: [facility] ENDOSCOPY;  Service: Endoscopy;  Laterality: N/A;  . ELBOW SURGERY Right    Has had 4 surgeries on right elbow.  Marland Kitchen HAND SURGERY Right 2008  . IR CV LINE INJECTION  09/10/2021  . JOINT REPLACEMENT    . LIVER BIOPSY  10/2017  . lung wedge resection  10/2017  . PORTA CATH INSERTION N/A 01/29/2017   Procedure: Glori Luis Cath Insertion;  Surgeon: Algernon Huxley, MD;  Location: [facility];  Service: Cardiovascular;  Laterality: N/A;  . TONSILLECTOMY      SOCIAL HISTORY: Social History   Socioeconomic History  . Marital status: Divorced    Spouse name: Not on file  . Number of children: Not on file  . Years of education: Not on file  . Highest education level: Not on file  Occupational History  . Not on file  Tobacco Use  . Smoking status: Former    Packs/day: 1.50    Years: 40.00    Total pack years: 60.00    Types: Cigarettes    Quit date: 07/25/2021    Years since quitting: 0.7  . Smokeless tobacco: Never  . Tobacco comments:    QUIT SMOKING 6 MONTHS AGO  Vaping Use  . Vaping Use: Never used  Substance and Sexual Activity  . Alcohol use: No  . Drug use: No  . Sexual activity: Not on file  Other Topics Concern  . Not on file  Social History Narrative  . Not on file   Social Determinants of Health   Financial Resource Strain: Not on file  Food Insecurity: Not on file  Transportation Needs: Not on file  Physical Activity: Not on file  Stress: Not on file  Social Connections: Not on file  Intimate Partner Violence: Not on file    FAMILY HISTORY: Family History  Problem Relation Age of Onset  . Lung cancer Mother 56  . Esophageal cancer Father 6  . Brain cancer Maternal Grandmother     ALLERGIES:  has No Known Allergies.  MEDICATIONS:  Current  Outpatient Medications  Medication Sig Dispense Refill  . albuterol (VENTOLIN HFA) 108 (90 Base) MCG/ACT inhaler Inhale 1-2 puffs into the lungs every 6 (six) hours  as needed for wheezing or shortness of breath.     Marland Kitchen amLODipine (NORVASC) 5 MG tablet Take 5 mg by mouth daily.    . baclofen (LIORESAL) 10 MG tablet Take 10 mg by mouth 3 (three) times daily.    . benzonatate (TESSALON) 200 MG capsule TAKE 1 CAPSULE (200 MG TOTAL) BY MOUTH 3 (THREE) TIMES DAILY AS NEEDED FOR COUGH. 20 capsule 1  . Calcium Carbonate (CALCIUM 500 PO) Take 1 tablet by mouth daily.    . folic acid (FOLVITE) 1 MG tablet TAKE 1 TABLET BY MOUTH EVERY DAY 90 tablet 1  . gabapentin (NEURONTIN) 300 MG capsule Take 1,200 mg by mouth 3 (three) times daily.     Marland Kitchen HYDROcodone-acetaminophen (NORCO) 10-325 MG tablet Take 1 tablet by mouth every 4 (four) hours as needed for moderate pain or severe pain.    Marland Kitchen HYDROmorphone HCl (DILAUDID-5 PO) Take by mouth.    . nortriptyline (PAMELOR) 50 MG capsule Take 50 mg by mouth at bedtime.    Marland Kitchen omeprazole (PRILOSEC) 20 MG capsule TAKE 1 CAPSULE BY MOUTH EVERY DAY 90 capsule 1  . Ostomy Supplies (PREMIER DRAINABLE POUCH 64MM) Pouch MISC     . Tiotropium Bromide-Olodaterol (STIOLTO RESPIMAT) 2.5-2.5 MCG/ACT AERS Inhale 2.5 mcg into the lungs 2 (two) times daily. 4 g 0  . valsartan (DIOVAN) 80 MG tablet Take 80 mg by mouth daily.    Marland Kitchen zolpidem (AMBIEN) 5 MG tablet Take 5 mg by mouth at bedtime as needed.     No current facility-administered medications for this visit.   Facility-Administered Medications Ordered in Other Visits  Medication Dose Route Frequency Provider Last Rate Last Admin  . heparin lock flush 100 UNIT/ML injection           . heparin lock flush 100 unit/mL  500 Units Intravenous Once Charlaine Dalton R, MD      . sodium chloride flush (NS) 0.9 % injection 10 mL  10 mL Intravenous Once Cammie Sickle, MD          .  PHYSICAL EXAMINATION: ECOG PERFORMANCE  STATUS: 1 - Symptomatic but completely ambulatory  There were no vitals filed for this visit.   There were no vitals filed for this visit.    Physical Exam Vitals and nursing note reviewed.  HENT:     Head: Normocephalic and atraumatic.     Mouth/Throat:     Pharynx: Oropharynx is clear.  Eyes:     Extraocular Movements: Extraocular movements intact.     Pupils: Pupils are equal, round, and reactive to light.  Cardiovascular:     Rate and Rhythm: Normal rate and regular rhythm.  Pulmonary:     Comments: Decreased breath sounds bilaterally.  Abdominal:     Palpations: Abdomen is soft.  Musculoskeletal:        General: Normal range of motion.     Cervical back: Normal range of motion.  Skin:    General: Skin is warm.  Neurological:     General: No focal deficit present.     Mental Status: She is alert and oriented to person, place, and time.  Psychiatric:        Behavior: Behavior normal.        Judgment: Judgment normal.     LABORATORY DATA:  I have reviewed the data as listed Lab Results  Component Value Date   WBC 7.9 01/14/2022   HGB 13.5 01/14/2022   HCT 41.5 01/14/2022   MCV 90.8 01/14/2022  PLT 280 01/14/2022   Recent Labs    08/10/21 1302 11/02/21 1253 11/09/21 1340 01/14/22 0844  NA 137  --  135 132*  K 3.5  --  3.7 3.8  CL 102  --  101 95*  CO2 27  --  27 28  GLUCOSE 104*  --  96 98  BUN 8  --  9 9  CREATININE 0.64 0.80 0.72 0.77  CALCIUM 8.7*  --  8.7* 8.7*  GFRNONAA >60  --  >60 >60  PROT 6.6  --  7.0 7.1  ALBUMIN 3.6  --  3.8 3.7  AST 14*  --  14* 15  ALT 9  --  9 9  ALKPHOS 90  --  92 93  BILITOT 0.4  --  0.2* 0.4     RADIOGRAPHIC STUDIES: I have personally reviewed the radiological images as listed and agreed with the findings in the report. No results found.  ASSESSMENT & PLAN:   No problem-specific Assessment & Plan notes found for this encounter.     All questions were answered. The patient knows to call the clinic  with any problems, questions or concerns.   Cammie Sickle, MD 04/16/2022 2:45 PM

## 2022-04-16 NOTE — Progress Notes (Signed)
Patient feels she has a decrease in appetite.  Worsening left Rib pain, 6/10 pain, stable chronic back pain.  Increasing SOBr on exertion, O2 sat 92% in office check.  Does have oxygen concentrator at home to use on PRN basis as well as oxygen cylinders.  Has f/u appt with pulmonologist in 05/2022.

## 2022-04-16 NOTE — Assessment & Plan Note (Addendum)
Stage IV rectal carcinoma: with History of  lung/liver metastases-s/p SBRT-left posterior chest wall lesion [July 09, 2021 finished].  Restaging CT scan- JAN 2023 shows progressive disease in bilateral lungs; and also increasing expansile size of the left rib lesion; no progression of disease in the liver.  #Had a long discussion with the patient regarding the concerning findings noted on the Jan 2023-CT scan-for progression of disease.  Recommend systemic chemotherapy on a palliative basis.  However after much deliberation/reflection -patient declines chemotherapy.  Since the treatment are palliative, patient feels that treatments/potential side effects are not worth.  She is extremely concerned about the side effects.   #Pain control: [Dr.Chigdy; [facility]-pain clinic]-hydrocodone 10 every 5-6 hours [as per patient; for her back pain]; currently with left posterior chest wall metastatic lesion-s/p SBRT-interestingly no significant response noted; worse followed by [facility] pain.  # COPD: Continue inhalers;   # port : port flush q 6-8 weeks  # Given the incurable nature of the disease /poor tolerance of therapy and in general less than 6 months of life expectancy-I introduced hospice philosophy to the patient.  Patient's husband gone through hospice recently.  Discussed that goal of care should be directed to symptom management rather than treating the underlying disease; and in the process help improve quality of life rather than quantity.  Discussed with [employer].  As per patient preference we will continue to follow every few months.  Dilaudid  6mg  every 4 housrs.Marland Kitchen awaiting re-evalu at [facility] in few week. s   # DISPOSITION: # follow up in 2months - MD; - labs- cbc/cmp/CEA; port flush; -;Dr.B

## 2022-04-17 ENCOUNTER — Encounter: Payer: Self-pay | Admitting: Hematology and Oncology

## 2022-04-17 ENCOUNTER — Encounter: Payer: Self-pay | Admitting: Internal Medicine

## 2022-04-17 LAB — CEA: CEA: 2762 ng/mL — ABNORMAL HIGH (ref 0.0–4.7)

## 2022-05-13 ENCOUNTER — Telehealth: Payer: Self-pay | Admitting: *Deleted

## 2022-05-13 DIAGNOSIS — R112 Nausea with vomiting, unspecified: Secondary | ICD-10-CM

## 2022-05-13 MED ORDER — ONDANSETRON 8 MG PO TBDP: 8.0000 mg | ORAL_TABLET | Freq: Three times a day (TID) | ORAL | 1 refills | Status: AC | PRN

## 2022-05-13 NOTE — Telephone Encounter (Signed)
Spoke with patient. She prefers script for zofran to be sent to her [address]. She will call our office back tomorrow if her zofran does not help with her nausea. She is drinking approx 4 bottles of water a day. She has been using pepto-bismol for her nausea, but this was not helping.  Script for zofran sent to patient's pharmacy.

## 2022-05-13 NOTE — Telephone Encounter (Signed)
Patient called reporting that she has had continuous nausea for 3 days and is vomiting twice a day for 3 days. [place] she has a stoma and has not had a bowel movement for 2 days and is having abdominal pain from that, but this is her norm. She does not have any antiemetics and is asking if we could give her prescription for it to [facility] at [facility] and [address] [employer] [address]

## 2022-05-23 ENCOUNTER — Ambulatory Visit: Payer: Medicare HMO | Admitting: Pulmonary Disease

## 2022-06-06 ENCOUNTER — Other Ambulatory Visit: Payer: Self-pay | Admitting: Internal Medicine

## 2022-06-06 DIAGNOSIS — E538 Deficiency of other specified B group vitamins: Secondary | ICD-10-CM

## 2022-06-07 ENCOUNTER — Encounter: Payer: Self-pay | Admitting: Internal Medicine

## 2022-06-07 ENCOUNTER — Encounter: Payer: Self-pay | Admitting: Hematology and Oncology

## 2022-06-18 ENCOUNTER — Emergency Department: Payer: Medicare HMO

## 2022-06-18 ENCOUNTER — Inpatient Hospital Stay
Admission: EM | Admit: 2022-06-18 | Discharge: 2022-06-21 | DRG: 167 | Disposition: A | Discharge status: Hospice | Payer: Medicare HMO | Attending: Hospitalist | Admitting: Hospitalist

## 2022-06-18 ENCOUNTER — Encounter
Admission: EM | Disposition: A | Discharge status: Hospice | Payer: Self-pay | Source: Home / Self Care | Attending: Hospitalist

## 2022-06-18 ENCOUNTER — Other Ambulatory Visit: Payer: Self-pay

## 2022-06-18 DIAGNOSIS — K56609 Unspecified intestinal obstruction, unspecified as to partial versus complete obstruction: Secondary | ICD-10-CM

## 2022-06-18 DIAGNOSIS — C189 Malignant neoplasm of colon, unspecified: Secondary | ICD-10-CM | POA: Diagnosis not present

## 2022-06-18 DIAGNOSIS — J690 Pneumonitis due to inhalation of food and vomit: Secondary | ICD-10-CM | POA: Diagnosis present

## 2022-06-18 DIAGNOSIS — C787 Secondary malignant neoplasm of liver and intrahepatic bile duct: Secondary | ICD-10-CM | POA: Diagnosis present

## 2022-06-18 DIAGNOSIS — J449 Chronic obstructive pulmonary disease, unspecified: Secondary | ICD-10-CM | POA: Diagnosis present

## 2022-06-18 DIAGNOSIS — Z9221 Personal history of antineoplastic chemotherapy: Secondary | ICD-10-CM | POA: Diagnosis not present

## 2022-06-18 DIAGNOSIS — K5669 Other partial intestinal obstruction: Secondary | ICD-10-CM | POA: Diagnosis present

## 2022-06-18 DIAGNOSIS — Z79899 Other long term (current) drug therapy: Secondary | ICD-10-CM | POA: Diagnosis not present

## 2022-06-18 DIAGNOSIS — J189 Pneumonia, unspecified organism: Secondary | ICD-10-CM | POA: Diagnosis not present

## 2022-06-18 DIAGNOSIS — Z808 Family history of malignant neoplasm of other organs or systems: Secondary | ICD-10-CM

## 2022-06-18 DIAGNOSIS — C7801 Secondary malignant neoplasm of right lung: Secondary | ICD-10-CM | POA: Diagnosis present

## 2022-06-18 DIAGNOSIS — J9601 Acute respiratory failure with hypoxia: Secondary | ICD-10-CM | POA: Diagnosis present

## 2022-06-18 DIAGNOSIS — I7143 Infrarenal abdominal aortic aneurysm, without rupture: Secondary | ICD-10-CM | POA: Diagnosis present

## 2022-06-18 DIAGNOSIS — Z515 Encounter for palliative care: Secondary | ICD-10-CM | POA: Diagnosis not present

## 2022-06-18 DIAGNOSIS — K435 Parastomal hernia without obstruction or  gangrene: Secondary | ICD-10-CM | POA: Diagnosis present

## 2022-06-18 DIAGNOSIS — E876 Hypokalemia: Secondary | ICD-10-CM | POA: Diagnosis present

## 2022-06-18 DIAGNOSIS — Z66 Do not resuscitate: Secondary | ICD-10-CM | POA: Diagnosis present

## 2022-06-18 DIAGNOSIS — Z801 Family history of malignant neoplasm of trachea, bronchus and lung: Secondary | ICD-10-CM | POA: Diagnosis not present

## 2022-06-18 DIAGNOSIS — F17210 Nicotine dependence, cigarettes, uncomplicated: Secondary | ICD-10-CM

## 2022-06-18 DIAGNOSIS — J441 Chronic obstructive pulmonary disease with (acute) exacerbation: Secondary | ICD-10-CM | POA: Diagnosis present

## 2022-06-18 DIAGNOSIS — Z90710 Acquired absence of both cervix and uterus: Secondary | ICD-10-CM

## 2022-06-18 DIAGNOSIS — I10 Essential (primary) hypertension: Secondary | ICD-10-CM

## 2022-06-18 DIAGNOSIS — I70201 Unspecified atherosclerosis of native arteries of extremities, right leg: Secondary | ICD-10-CM | POA: Diagnosis not present

## 2022-06-18 DIAGNOSIS — C20 Malignant neoplasm of rectum: Secondary | ICD-10-CM | POA: Diagnosis present

## 2022-06-18 DIAGNOSIS — I998 Other disorder of circulatory system: Principal | ICD-10-CM

## 2022-06-18 DIAGNOSIS — Z720 Tobacco use: Secondary | ICD-10-CM | POA: Diagnosis present

## 2022-06-18 DIAGNOSIS — Z800 Family history of malignant neoplasm of digestive organs: Secondary | ICD-10-CM | POA: Diagnosis not present

## 2022-06-18 DIAGNOSIS — K56600 Partial intestinal obstruction, unspecified as to cause: Secondary | ICD-10-CM | POA: Diagnosis not present

## 2022-06-18 DIAGNOSIS — F419 Anxiety disorder, unspecified: Secondary | ICD-10-CM | POA: Diagnosis present

## 2022-06-18 DIAGNOSIS — I70223 Atherosclerosis of native arteries of extremities with rest pain, bilateral legs: Secondary | ICD-10-CM | POA: Diagnosis present

## 2022-06-18 DIAGNOSIS — I70222 Atherosclerosis of native arteries of extremities with rest pain, left leg: Secondary | ICD-10-CM

## 2022-06-18 DIAGNOSIS — D490 Neoplasm of unspecified behavior of digestive system: Secondary | ICD-10-CM | POA: Diagnosis present

## 2022-06-18 DIAGNOSIS — J9621 Acute and chronic respiratory failure with hypoxia: Principal | ICD-10-CM | POA: Diagnosis present

## 2022-06-18 DIAGNOSIS — I3139 Other pericardial effusion (noninflammatory): Secondary | ICD-10-CM | POA: Diagnosis present

## 2022-06-18 DIAGNOSIS — I714 Abdominal aortic aneurysm, without rupture, unspecified: Secondary | ICD-10-CM | POA: Diagnosis not present

## 2022-06-18 DIAGNOSIS — I745 Embolism and thrombosis of iliac artery: Secondary | ICD-10-CM | POA: Diagnosis present

## 2022-06-18 DIAGNOSIS — G8929 Other chronic pain: Secondary | ICD-10-CM | POA: Diagnosis present

## 2022-06-18 DIAGNOSIS — K219 Gastro-esophageal reflux disease without esophagitis: Secondary | ICD-10-CM | POA: Diagnosis present

## 2022-06-18 DIAGNOSIS — M79605 Pain in left leg: Secondary | ICD-10-CM

## 2022-06-18 DIAGNOSIS — I2510 Atherosclerotic heart disease of native coronary artery without angina pectoris: Secondary | ICD-10-CM | POA: Diagnosis present

## 2022-06-18 DIAGNOSIS — C7802 Secondary malignant neoplasm of left lung: Secondary | ICD-10-CM | POA: Diagnosis present

## 2022-06-18 DIAGNOSIS — Z85048 Personal history of other malignant neoplasm of rectum, rectosigmoid junction, and anus: Secondary | ICD-10-CM

## 2022-06-18 DIAGNOSIS — Z923 Personal history of irradiation: Secondary | ICD-10-CM

## 2022-06-18 DIAGNOSIS — M79672 Pain in left foot: Secondary | ICD-10-CM | POA: Diagnosis present

## 2022-06-18 HISTORY — PX: LOWER EXTREMITY ANGIOGRAPHY: CATH118251

## 2022-06-18 HISTORY — DX: Unspecified asthma, uncomplicated: J45.909

## 2022-06-18 LAB — BASIC METABOLIC PANEL
Anion gap: 11 (ref 5–15)
BUN: <5 mg/dL — ABNORMAL LOW (ref 8–23)
CO2: 35 mmol/L — ABNORMAL HIGH (ref 22–32)
Calcium: 8.6 mg/dL — ABNORMAL LOW (ref 8.9–10.3)
Chloride: 90 mmol/L — ABNORMAL LOW (ref 98–111)
Creatinine, Ser: 0.56 mg/dL (ref 0.44–1.00)
GFR, Estimated: >60 mL/min (ref >60)
Glucose, Bld: 115 mg/dL — ABNORMAL HIGH (ref 70–99)
Potassium: 2.8 mmol/L — ABNORMAL LOW (ref 3.5–5.1)
Sodium: 136 mmol/L (ref 135–145)

## 2022-06-18 LAB — MRSA NEXT GEN BY PCR, NASAL: MRSA by PCR Next Gen: NOT DETECTED

## 2022-06-18 LAB — PROTIME-INR
INR: 1.1 (ref 0.8–1.2)
Prothrombin Time: 13.9 seconds (ref 11.4–15.2)

## 2022-06-18 LAB — HEPATIC FUNCTION PANEL
ALT: 12 U/L (ref 0–44)
AST: 18 U/L (ref 15–41)
Albumin: 2.8 g/dL — ABNORMAL LOW (ref 3.5–5.0)
Alkaline Phosphatase: 108 U/L (ref 38–126)
Bilirubin, Direct: 0.2 mg/dL (ref 0.0–0.2)
Indirect Bilirubin: 0.3 mg/dL (ref 0.3–0.9)
Total Bilirubin: 0.5 mg/dL (ref 0.3–1.2)
Total Protein: 6.2 g/dL — ABNORMAL LOW (ref 6.5–8.1)

## 2022-06-18 LAB — CBC
HCT: 43.5 % (ref 36.0–46.0)
Hemoglobin: 13.8 g/dL (ref 12.0–15.0)
MCH: 28.2 pg (ref 26.0–34.0)
MCHC: 31.7 g/dL (ref 30.0–36.0)
MCV: 88.8 fL (ref 80.0–100.0)
Platelets: 256 10*3/uL (ref 150–400)
RBC: 4.9 MIL/uL (ref 3.87–5.11)
RDW: 14 % (ref 11.5–15.5)
WBC: 10 10*3/uL (ref 4.0–10.5)
nRBC: 0 % (ref 0.0–0.2)

## 2022-06-18 LAB — PROCALCITONIN: Procalcitonin: <0.1 ng/mL

## 2022-06-18 LAB — BRAIN NATRIURETIC PEPTIDE: B Natriuretic Peptide: 273 pg/mL — ABNORMAL HIGH (ref 0.0–100.0)

## 2022-06-18 LAB — APTT: aPTT: 32 seconds (ref 24–36)

## 2022-06-18 LAB — MAGNESIUM: Magnesium: 1.7 mg/dL (ref 1.7–2.4)

## 2022-06-18 LAB — TROPONIN I (HIGH SENSITIVITY)
Troponin I (High Sensitivity): 30 ng/L — ABNORMAL HIGH (ref <18)
Troponin I (High Sensitivity): 41 ng/L — ABNORMAL HIGH (ref <18)

## 2022-06-18 SURGERY — LOWER EXTREMITY ANGIOGRAPHY
Anesthesia: Moderate Sedation | Laterality: Left

## 2022-06-18 MED ORDER — MIDAZOLAM HCL 2 MG/2ML IJ SOLN
INTRAMUSCULAR | Status: AC
  Filled 2022-06-18: qty 2

## 2022-06-18 MED ORDER — SENNOSIDES-DOCUSATE SODIUM 8.6-50 MG PO TABS: 1.0000 | ORAL_TABLET | Freq: Every evening | ORAL | Status: DC | PRN

## 2022-06-18 MED ORDER — SODIUM CHLORIDE 0.9 % IV SOLN: 500.0000 mg | Freq: Every day | INTRAVENOUS | Status: DC

## 2022-06-18 MED ORDER — MORPHINE SULFATE (PF) 4 MG/ML IV SOLN: 4.0000 mg | INTRAVENOUS | Status: DC | PRN

## 2022-06-18 MED ORDER — IODIXANOL 320 MG/ML IV SOLN
INTRAVENOUS | Status: DC | PRN
  Administered 2022-06-18: 65 mL

## 2022-06-18 MED ORDER — CEFAZOLIN SODIUM-DEXTROSE 2-4 GM/100ML-% IV SOLN
2.0000 g | Freq: Once | INTRAVENOUS | Status: AC
  Administered 2022-06-18: 2 g via INTRAVENOUS

## 2022-06-18 MED ORDER — ALBUTEROL SULFATE (2.5 MG/3ML) 0.083% IN NEBU
3.0000 mL | INHALATION_SOLUTION | RESPIRATORY_TRACT | Status: DC | PRN
  Administered 2022-06-18: 3 mL via RESPIRATORY_TRACT
  Filled 2022-06-18: qty 3

## 2022-06-18 MED ORDER — SODIUM CHLORIDE 0.9 % IV SOLN
2.0000 g | Freq: Three times a day (TID) | INTRAVENOUS | Status: DC
  Administered 2022-06-18 – 2022-06-19 (×3): 2 g via INTRAVENOUS
  Filled 2022-06-18 (×5): qty 12.5

## 2022-06-18 MED ORDER — METHYLPREDNISOLONE SODIUM SUCC 125 MG IJ SOLR
125.0000 mg | Freq: Once | INTRAMUSCULAR | Status: AC
  Administered 2022-06-18: 125 mg via INTRAVENOUS
  Filled 2022-06-18: qty 2

## 2022-06-18 MED ORDER — IPRATROPIUM-ALBUTEROL 0.5-2.5 (3) MG/3ML IN SOLN
3.0000 mL | Freq: Once | RESPIRATORY_TRACT | Status: AC
  Administered 2022-06-18: 3 mL via RESPIRATORY_TRACT
  Filled 2022-06-18: qty 6

## 2022-06-18 MED ORDER — POTASSIUM CHLORIDE CRYS ER 20 MEQ PO TBCR: 40.0000 meq | EXTENDED_RELEASE_TABLET | Freq: Once | ORAL | Status: DC

## 2022-06-18 MED ORDER — HYDROMORPHONE HCL 1 MG/ML IJ SOLN
INTRAMUSCULAR | Status: AC
  Filled 2022-06-18: qty 1

## 2022-06-18 MED ORDER — HYDROMORPHONE HCL 1 MG/ML IJ SOLN
1.0000 mg | Freq: Once | INTRAMUSCULAR | Status: AC
  Administered 2022-06-18: 1 mg via INTRAVENOUS
  Filled 2022-06-18: qty 1

## 2022-06-18 MED ORDER — SODIUM CHLORIDE 0.9 % IV SOLN: 250.0000 mL | INTRAVENOUS | Status: DC | PRN

## 2022-06-18 MED ORDER — HYDROMORPHONE HCL 1 MG/ML IJ SOLN: 1.0000 mg | INTRAMUSCULAR | Status: DC | PRN

## 2022-06-18 MED ORDER — ONDANSETRON HCL 4 MG/2ML IJ SOLN: 4.0000 mg | Freq: Four times a day (QID) | INTRAMUSCULAR | Status: DC | PRN

## 2022-06-18 MED ORDER — SODIUM CHLORIDE 0.9 % IV SOLN
500.0000 mg | Freq: Every day | INTRAVENOUS | Status: DC
  Administered 2022-06-18 – 2022-06-20 (×3): 500 mg via INTRAVENOUS
  Filled 2022-06-18 (×6): qty 5

## 2022-06-18 MED ORDER — IPRATROPIUM-ALBUTEROL 0.5-2.5 (3) MG/3ML IN SOLN
3.0000 mL | Freq: Once | RESPIRATORY_TRACT | Status: AC
  Administered 2022-06-18: 3 mL via RESPIRATORY_TRACT

## 2022-06-18 MED ORDER — SODIUM CHLORIDE 0.9 % IV BOLUS
1000.0000 mL | Freq: Once | INTRAVENOUS | Status: AC
  Administered 2022-06-18: 1000 mL via INTRAVENOUS

## 2022-06-18 MED ORDER — SODIUM CHLORIDE 0.9% FLUSH
3.0000 mL | Freq: Two times a day (BID) | INTRAVENOUS | Status: DC
  Administered 2022-06-18 – 2022-06-21 (×6): 3 mL via INTRAVENOUS

## 2022-06-18 MED ORDER — HEPARIN SODIUM (PORCINE) 1000 UNIT/ML IJ SOLN
INTRAMUSCULAR | Status: DC | PRN
  Administered 2022-06-18: 5000 [IU] via INTRAVENOUS

## 2022-06-18 MED ORDER — HYDROMORPHONE HCL 1 MG/ML IJ SOLN
0.5000 mg | INTRAMUSCULAR | Status: DC | PRN
  Administered 2022-06-18 – 2022-06-19 (×2): 0.5 mg via INTRAVENOUS
  Administered 2022-06-19: 1 mg via INTRAVENOUS
  Filled 2022-06-18 (×3): qty 1

## 2022-06-18 MED ORDER — SODIUM CHLORIDE 0.9 % IV SOLN: INTRAVENOUS | Status: AC

## 2022-06-18 MED ORDER — ONDANSETRON HCL 4 MG PO TABS: 4.0000 mg | ORAL_TABLET | Freq: Four times a day (QID) | ORAL | Status: DC | PRN

## 2022-06-18 MED ORDER — HEPARIN (PORCINE) 25000 UT/250ML-% IV SOLN: 1000.0000 [IU]/h | INTRAVENOUS | Status: DC

## 2022-06-18 MED ORDER — SODIUM CHLORIDE 0.9 % IV SOLN: INTRAVENOUS | Status: DC

## 2022-06-18 MED ORDER — HEPARIN (PORCINE) 25000 UT/250ML-% IV SOLN
INTRAVENOUS | Status: AC
  Administered 2022-06-18: 1000 [IU]/h via INTRAVENOUS
  Filled 2022-06-18: qty 250

## 2022-06-18 MED ORDER — HYDROMORPHONE HCL 1 MG/ML IJ SOLN
INTRAMUSCULAR | Status: DC | PRN
  Administered 2022-06-18: .5 mg via INTRAVENOUS
  Administered 2022-06-18: 1 mg via INTRAVENOUS

## 2022-06-18 MED ORDER — ACETAMINOPHEN 325 MG PO TABS: 650.0000 mg | ORAL_TABLET | Freq: Four times a day (QID) | ORAL | Status: DC | PRN

## 2022-06-18 MED ORDER — HEPARIN SODIUM (PORCINE) 1000 UNIT/ML IJ SOLN
INTRAMUSCULAR | Status: AC
  Filled 2022-06-18: qty 10

## 2022-06-18 MED ORDER — FENTANYL CITRATE PF 50 MCG/ML IJ SOSY: 50.0000 ug | PREFILLED_SYRINGE | INTRAMUSCULAR | Status: DC | PRN

## 2022-06-18 MED ORDER — CEFAZOLIN SODIUM-DEXTROSE 2-4 GM/100ML-% IV SOLN: 2.0000 g | INTRAVENOUS | Status: DC

## 2022-06-18 MED ORDER — ACETAMINOPHEN 650 MG RE SUPP: 650.0000 mg | Freq: Four times a day (QID) | RECTAL | Status: DC | PRN

## 2022-06-18 MED ORDER — VANCOMYCIN HCL 1750 MG/350ML IV SOLN
1750.0000 mg | INTRAVENOUS | Status: DC
  Administered 2022-06-19: 1750 mg via INTRAVENOUS
  Filled 2022-06-18 (×2): qty 350

## 2022-06-18 MED ORDER — NICOTINE 21 MG/24HR TD PT24: 21.0000 mg | MEDICATED_PATCH | Freq: Every day | TRANSDERMAL | Status: DC | PRN

## 2022-06-18 MED ORDER — ONDANSETRON HCL 4 MG/2ML IJ SOLN
4.0000 mg | Freq: Once | INTRAMUSCULAR | Status: AC
  Administered 2022-06-18: 4 mg via INTRAVENOUS
  Filled 2022-06-18: qty 2

## 2022-06-18 MED ORDER — HEPARIN (PORCINE) 25000 UT/250ML-% IV SOLN
1300.0000 [IU]/h | INTRAVENOUS | Status: DC
  Administered 2022-06-19 – 2022-06-20 (×2): 1300 [IU]/h via INTRAVENOUS
  Filled 2022-06-18 (×2): qty 250

## 2022-06-18 MED ORDER — VANCOMYCIN HCL 1750 MG/350ML IV SOLN: 1750.0000 mg | INTRAVENOUS | Status: DC

## 2022-06-18 MED ORDER — IOHEXOL 350 MG/ML SOLN
75.0000 mL | Freq: Once | INTRAVENOUS | Status: AC | PRN
  Administered 2022-06-18: 75 mL via INTRAVENOUS

## 2022-06-18 MED ORDER — MIDAZOLAM HCL 2 MG/2ML IJ SOLN
INTRAMUSCULAR | Status: DC | PRN
  Administered 2022-06-18: 2 mg via INTRAVENOUS

## 2022-06-18 MED ORDER — SODIUM CHLORIDE 0.9% FLUSH: 3.0000 mL | INTRAVENOUS | Status: DC | PRN

## 2022-06-18 MED ORDER — SODIUM CHLORIDE 0.9 % IV SOLN
2.0000 g | Freq: Three times a day (TID) | INTRAVENOUS | Status: DC
  Filled 2022-06-18: qty 12.5

## 2022-06-18 MED ORDER — IPRATROPIUM-ALBUTEROL 0.5-2.5 (3) MG/3ML IN SOLN
3.0000 mL | Freq: Four times a day (QID) | RESPIRATORY_TRACT | Status: DC | PRN
  Administered 2022-06-19 – 2022-06-20 (×2): 3 mL via RESPIRATORY_TRACT
  Filled 2022-06-18 (×2): qty 3

## 2022-06-18 MED ORDER — POTASSIUM CHLORIDE 10 MEQ/100ML IV SOLN
10.0000 meq | INTRAVENOUS | Status: AC
  Administered 2022-06-18 (×2): 10 meq via INTRAVENOUS
  Filled 2022-06-18 (×2): qty 100

## 2022-06-18 MED ORDER — OXYCODONE HCL 5 MG PO TABS
5.0000 mg | ORAL_TABLET | ORAL | Status: DC | PRN
  Administered 2022-06-18: 10 mg via ORAL
  Filled 2022-06-18: qty 2

## 2022-06-18 MED ORDER — VANCOMYCIN HCL 1750 MG/350ML IV SOLN
1750.0000 mg | INTRAVENOUS | Status: DC
  Filled 2022-06-18: qty 350

## 2022-06-18 MED ORDER — FENTANYL CITRATE (PF) 100 MCG/2ML IJ SOLN
INTRAMUSCULAR | Status: AC
  Filled 2022-06-18: qty 2

## 2022-06-18 MED ORDER — POTASSIUM CHLORIDE CRYS ER 20 MEQ PO TBCR
40.0000 meq | EXTENDED_RELEASE_TABLET | Freq: Once | ORAL | Status: AC
  Administered 2022-06-18: 40 meq via ORAL
  Filled 2022-06-18: qty 2

## 2022-06-18 SURGICAL SUPPLY — 17 items
CATH ANGIO 5F PIGTAIL 65CM (CATHETERS) IMPLANT
CATH BEACON 5 .035 65 KMP TIP (CATHETERS) IMPLANT
DEVICE SAFEGUARD 24CM (GAUZE/BANDAGES/DRESSINGS) IMPLANT
DEVICE STARCLOSE SE CLOSURE (Vascular Products) IMPLANT
GLIDEWIRE ADV .035X260CM (WIRE) IMPLANT
INTRODUCER 7FR 23CM (INTRODUCER) IMPLANT
KIT ENCORE 26 ADVANTAGE (KITS) IMPLANT
NDL ENTRY 21GA 7CM ECHOTIP (NEEDLE) IMPLANT
NEEDLE ENTRY 21GA 7CM ECHOTIP (NEEDLE) ×1 IMPLANT
PACK ANGIOGRAPHY (CUSTOM PROCEDURE TRAY) ×1 IMPLANT
SET INTRO CAPELLA COAXIAL (SET/KITS/TRAYS/PACK) IMPLANT
SHEATH BRITE TIP 5FRX11 (SHEATH) IMPLANT
STENT LIFESTREAM 8X58X80 (Permanent Stent) IMPLANT
STENT LIFESTREAM 9X38X80 (Permanent Stent) IMPLANT
SYR MEDRAD MARK 7 150ML (SYRINGE) IMPLANT
TUBING CONTRAST HIGH PRESS 72 (TUBING) IMPLANT
WIRE GUIDERIGHT .035X150 (WIRE) IMPLANT

## 2022-06-18 NOTE — Assessment & Plan Note (Signed)
-   Palliative care has been consulted

## 2022-06-18 NOTE — Assessment & Plan Note (Signed)
-   Decreased pallor of the left fifth toe - Concerning for ischemic left fifth toe - Vascular has been consulted - Patient is not a surgical candidate - Per vascular, recommend heparin GTT

## 2022-06-18 NOTE — ED Provider Notes (Signed)
[facility] Provider Note    Event Date/Time   First MD Initiated Contact with Patient 06/18/22 1158     (approximate)   History   Shortness of Breath   HPI  Tamara Hall is a 66 y.o. 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 Has been using her inhaler at home. Pt satting 93% on RA lungs with wheezing with EMS so she was given a duoneb and albuterol tx, improved to 95%. HR elevated as well. Pt reports primarily SOB, mild cough. Pt also c/o severe back and foot pain, ongoing issue but intermittently worsening.  She has not seen a physician for this.  She states that the left foot pain has severely worsened over the last several days.  She has exquisite pain with even light touch.  She has been unable to walk due to this.  She been taking her p.o. Dilaudid without significantly.      Physical Exam   Triage Vital Signs: ED Triage Vitals [06/18/22 1204]  Enc Vitals Group     BP      Pulse      Resp      Temp      Temp src      SpO2      Weight      Height      Head Circumference      Peak Flow      Pain Score 10     Pain Loc      Pain Edu?      Excl. in [place]?     Most recent vital signs: Vitals:   06/18/22 1845 06/18/22 1900  BP: (!) 140/60   Pulse: 99   Resp: 19   Temp:    SpO2: 96% 94%     General: Awake, appears uncomfortable, in mild distress. CV:  Good peripheral perfusion.  Tachycardic Resp:  Normal effort.  Diffuse wheezing with diminished aeration Abd:  No distention.  No tenderness Other:  Dopplerable but nonpalpable pulses to bilateral feet.  The left foot has multiple areas of duskiness along the plantar aspect of the foot as well as cyanosis and duskiness with coolness to touch of the left fifth digit.   ED Results / Procedures / Treatments   Labs (all labs ordered are listed, but only abnormal results are displayed) Labs Reviewed  BASIC METABOLIC PANEL -  Abnormal; Notable for the following components:      Result Value   Potassium 2.8 (*)    Chloride 90 (*)    CO2 35 (*)    Glucose, Bld 115 (*)    BUN <5 (*)    Calcium 8.6 (*)    All other components within normal limits  BRAIN NATRIURETIC PEPTIDE - Abnormal; Notable for the following components:   B Natriuretic Peptide 273.0 (*)    All other components within normal limits  HEPATIC FUNCTION PANEL - Abnormal; Notable for the following components:   Total Protein 6.2 (*)    Albumin 2.8 (*)    All other components within normal limits  TROPONIN I (HIGH SENSITIVITY) - Abnormal; Notable for the following components:   Troponin I (High Sensitivity) 30 (*)    All other components within normal limits  TROPONIN I (HIGH SENSITIVITY) - Abnormal; Notable for the following components:   Troponin I (High Sensitivity) 41 (*)    All other components within normal limits  MRSA NEXT GEN BY PCR, NASAL  CBC  APTT  PROTIME-INR  MAGNESIUM  PROCALCITONIN  HIV ANTIBODY (ROUTINE TESTING W REFLEX)  HEPARIN LEVEL (UNFRACTIONATED)  BASIC METABOLIC PANEL  CBC  PROCALCITONIN     EKG Sinus tachycardia, ventricular rate 116.  PR 144, QRS 87, QTc 428.  No acute ST elevations or depressions.  No ischemia or infarct.   RADIOLOGY CT abdomen/pelvis: CT angio chest: No PE, large lytic lesion in the eighth rib and diffuse metastatic disease throughout all lobes bilaterally, pericardial effusion noted CT abdomen/pelvis:possible SBO per my read, formal read pending Ultrasound lower extremity Pending      I also independently reviewed and agree with radiologist interpretations.   PROCEDURES:  Critical Care performed: Yes, see critical care procedure note(s)  .Critical Care  Performed by: Duffy Bruce, MD Authorized by: Duffy Bruce, MD   Critical care provider statement:    Critical care time (minutes):  30   Critical care time was exclusive of:  Separately billable procedures and  treating other patients   Critical care was necessary to treat or prevent imminent or life-threatening deterioration of the following conditions:  Cardiac failure, circulatory failure and respiratory failure   Critical care was time spent personally by me on the following activities:  Development of treatment plan with patient or surrogate, discussions with consultants, evaluation of patient's response to treatment, examination of patient, ordering and review of laboratory studies, ordering and review of radiographic studies, ordering and performing treatments and interventions, pulse oximetry, re-evaluation of patient's condition and review of [facility] [place] [facility]: Medications  albuterol (PROVENTIL) (2.5 MG/3ML) 0.083% nebulizer solution 3 mL ( Inhalation MAR Hold 06/18/22 1655)  acetaminophen (TYLENOL) tablet 650 mg ( Oral MAR Hold 06/18/22 1655)    Or  acetaminophen (TYLENOL) suppository 650 mg ( Rectal MAR Hold 06/18/22 1655)  ondansetron (ZOFRAN) tablet 4 mg ( Oral MAR Hold 06/18/22 1655)  senna-docusate (Senokot-S) tablet 1 tablet ( Oral MAR Hold 06/18/22 1655)  potassium chloride SA (KLOR-CON M) CR tablet 40 mEq ( Oral MAR Hold 06/18/22 1655)  azithromycin (ZITHROMAX) 500 mg in sodium chloride 0.9 % 250 mL IVPB ( Intravenous Automatically Held 06/22/22 1000)  vancomycin (VANCOREADY) IVPB 1750 mg/350 mL ( Intravenous Automatically Held 06/26/22 1600)  ceFEPIme (MAXIPIME) 2 g in sodium chloride 0.9 % 100 mL IVPB ( Intravenous Automatically Held 06/26/22 2200)  nicotine (NICODERM CQ - dosed in mg/24 hours) patch 21 mg ( Transdermal MAR Hold 06/18/22 1655)  0.9 %  sodium chloride infusion ( Intravenous New Bag/Given 06/18/22 1716)  ipratropium-albuterol (DUONEB) 0.5-2.5 (3) MG/3ML nebulizer solution 3 mL (has no administration in time range)  oxyCODONE (Oxy IR/ROXICODONE) immediate release tablet 5-10 mg (has no administration in time range)  ondansetron (ZOFRAN) injection 4 mg  (has no administration in time range)  0.9 %  sodium chloride infusion ( Intravenous Not Given 06/18/22 1843)  sodium chloride flush (NS) 0.9 % injection 3 mL (has no administration in time range)  sodium chloride flush (NS) 0.9 % injection 3 mL (has no administration in time range)  0.9 %  sodium chloride infusion (has no administration in time range)  HYDROmorphone (DILAUDID) injection 0.5-1 mg (has no administration in time range)  heparin ADULT infusion 100 units/mL (25000 units/227m) (1,000 Units/hr Intravenous New Bag/Given 06/18/22 1920)  HYDROmorphone (DILAUDID) injection 1 mg (1 mg Intravenous Given 06/18/22 1237)  ondansetron (ZOFRAN) injection 4 mg (4 mg Intravenous Given 06/18/22 1240)  ipratropium-albuterol (DUONEB) 0.5-2.5 (  3) MG/3ML nebulizer solution 3 mL (3 mLs Nebulization Given 06/18/22 1524)  ipratropium-albuterol (DUONEB) 0.5-2.5 (3) MG/3ML nebulizer solution 3 mL (3 mLs Nebulization Given 06/18/22 1523)  methylPREDNISolone sodium succinate (SOLU-MEDROL) 125 mg/2 mL injection 125 mg (125 mg Intravenous Given 06/18/22 1441)  iohexol (OMNIPAQUE) 350 MG/ML injection 75 mL (75 mLs Intravenous Contrast Given 06/18/22 1445)  HYDROmorphone (DILAUDID) injection 1 mg (1 mg Intravenous Given 06/18/22 1434)  sodium chloride 0.9 % bolus 1,000 mL (0 mLs Intravenous Stopped 06/18/22 1630)  sodium chloride 0.9 % bolus 1,000 mL (0 mLs Intravenous Stopped 06/18/22 1705)  potassium chloride SA (KLOR-CON M) CR tablet 40 mEq (40 mEq Oral Given 06/18/22 1521)  potassium chloride 10 mEq in 100 mL IVPB (0 mEq Intravenous Stopped 06/18/22 1801)  ceFAZolin (ANCEF) IVPB 2g/100 mL premix (0 g Intravenous Stopped 06/18/22 1804)     IMPRESSION / MDM / ASSESSMENT AND PLAN / ED COURSE  I reviewed the triage vital signs and the nursing notes.                               The patient is on the cardiac monitor to evaluate for evidence of arrhythmia and/or significant heart rate changes.   Ddx:  Differential  includes the following, with pertinent life- or limb-threatening emergencies considered:  SOB 2/2 PNA, COPD exacerbation, PE, metastatic lung disease, back/leg pain 2/2 radiculopathy, pvd with ischemia, embolic disease, obstruction of IVC/iliac from colon CA/mass, metastases  Patient's presentation is most consistent with acute presentation with potential threat to life or bodily function.  MDM:  66 yo F with metastatic colon CA here with multiple complaints. Primary complaint is SOB, which I suspect is multifactorial 2/2 PNA, lung mets, and COPD. DIffuse wheezing noted on exam. IV solumedrol, duonebs x 3 given for this. No signs of hypercapnea clinically. Re: her pain - suspect this is related to bony met disease, but pt also has h/o AAA s/p repair. She has significant pain and apparent cyanosis/ischemia of the left fifth toe, as imaged in the chart - unclear whether this is 2/2 general hypoperfusion from her acute condition versus embolic disease vs PVD. Heparin gtt started and Vascular Surgery consulted. Otherwise, pt started on empiric ABX for possible PNA and will plan to admit to medicine.  CBC without leukocytosis. BMP with hypokalemia. Trop/BNP mildly elevated likely 2/2 demand, EKG nonischemic.   CT imaging obtained and reviewed. No PE noted. Pt does, however, have diffuse metastatic lung disease, pericardial effusion, and CAD. CT A/P shows ? SBO as well as infrarenal abdominal aortic aneurysm.  Will admit to medicine with vascular surgery consultation.   MEDICATIONS GIVEN IN ED: Medications  albuterol (PROVENTIL) (2.5 MG/3ML) 0.083% nebulizer solution 3 mL ( Inhalation MAR Hold 06/18/22 1655)  acetaminophen (TYLENOL) tablet 650 mg ( Oral MAR Hold 06/18/22 1655)    Or  acetaminophen (TYLENOL) suppository 650 mg ( Rectal MAR Hold 06/18/22 1655)  ondansetron (ZOFRAN) tablet 4 mg ( Oral MAR Hold 06/18/22 1655)  senna-docusate (Senokot-S) tablet 1 tablet ( Oral MAR Hold 06/18/22 1655)   potassium chloride SA (KLOR-CON M) CR tablet 40 mEq ( Oral MAR Hold 06/18/22 1655)  azithromycin (ZITHROMAX) 500 mg in sodium chloride 0.9 % 250 mL IVPB ( Intravenous Automatically Held 06/22/22 1000)  vancomycin (VANCOREADY) IVPB 1750 mg/350 mL ( Intravenous Automatically Held 06/26/22 1600)  ceFEPIme (MAXIPIME) 2 g in sodium chloride 0.9 % 100 mL IVPB ( Intravenous Automatically Held 06/26/22  2200)  nicotine (NICODERM CQ - dosed in mg/24 hours) patch 21 mg ( Transdermal MAR Hold 06/18/22 1655)  0.9 %  sodium chloride infusion ( Intravenous New Bag/Given 06/18/22 1716)  ipratropium-albuterol (DUONEB) 0.5-2.5 (3) MG/3ML nebulizer solution 3 mL (has no administration in time range)  oxyCODONE (Oxy IR/ROXICODONE) immediate release tablet 5-10 mg (has no administration in time range)  ondansetron (ZOFRAN) injection 4 mg (has no administration in time range)  0.9 %  sodium chloride infusion ( Intravenous Not Given 06/18/22 1843)  sodium chloride flush (NS) 0.9 % injection 3 mL (has no administration in time range)  sodium chloride flush (NS) 0.9 % injection 3 mL (has no administration in time range)  0.9 %  sodium chloride infusion (has no administration in time range)  HYDROmorphone (DILAUDID) injection 0.5-1 mg (has no administration in time range)  heparin ADULT infusion 100 units/mL (25000 units/21m) (1,000 Units/hr Intravenous New Bag/Given 06/18/22 1920)  HYDROmorphone (DILAUDID) injection 1 mg (1 mg Intravenous Given 06/18/22 1237)  ondansetron (ZOFRAN) injection 4 mg (4 mg Intravenous Given 06/18/22 1240)  ipratropium-albuterol (DUONEB) 0.5-2.5 (3) MG/3ML nebulizer solution 3 mL (3 mLs Nebulization Given 06/18/22 1524)  ipratropium-albuterol (DUONEB) 0.5-2.5 (3) MG/3ML nebulizer solution 3 mL (3 mLs Nebulization Given 06/18/22 1523)  methylPREDNISolone sodium succinate (SOLU-MEDROL) 125 mg/2 mL injection 125 mg (125 mg Intravenous Given 06/18/22 1441)  iohexol (OMNIPAQUE) 350 MG/ML injection 75 mL (75  mLs Intravenous Contrast Given 06/18/22 1445)  HYDROmorphone (DILAUDID) injection 1 mg (1 mg Intravenous Given 06/18/22 1434)  sodium chloride 0.9 % bolus 1,000 mL (0 mLs Intravenous Stopped 06/18/22 1630)  sodium chloride 0.9 % bolus 1,000 mL (0 mLs Intravenous Stopped 06/18/22 1705)  potassium chloride SA (KLOR-CON M) CR tablet 40 mEq (40 mEq Oral Given 06/18/22 1521)  potassium chloride 10 mEq in 100 mL IVPB (0 mEq Intravenous Stopped 06/18/22 1801)  ceFAZolin (ANCEF) IVPB 2g/100 mL premix (0 g Intravenous Stopped 06/18/22 1804)     Consults:  Dr. SDelana MeyerVascular Surgery Hospitalist Dr. CTobie Poet   EMR reviewed  Oncology notes from Dr. BBurlene Arnt     FINAL CLINICAL IMPRESSION(S) / ED DIAGNOSES   Final diagnoses:  Acute lower limb ischemia  Malignant neoplasm metastatic to both lungs ([facility]  COPD exacerbation ([facility]  Acute on chronic respiratory failure with hypoxemia ([facility]     Rx / DC Orders   ED Discharge Orders     None        Note:  This document was prepared using Dragon voice recognition software and may include unintentional dictation errors.   IDuffy Bruce MD 06/18/22 1[place]ly [place]

## 2022-06-18 NOTE — Consult Note (Signed)
Patient ID: Tamara Hall, female   DOB: [birth year], 67 y.o.   MRN: [id number]  HPI Tamara Hall is a 66 y.o. female ***  HPI  Past Medical History:  Diagnosis Date   Asthma    Cancer ([place])    colon, with mets to liver and left lung   Cough    Hypertension    Pneumonia    20+ years ago    Past Surgical History:  Procedure Laterality Date   ABDOMINAL HYSTERECTOMY     BREAST SURGERY     COLON SURGERY  10/2017   [facility]   COLONOSCOPY WITH PROPOFOL N/A 01/14/2017   Procedure: COLONOSCOPY WITH PROPOFOL;  Surgeon: Lucilla Lame, MD;  Location: [facility] ENDOSCOPY;  Service: Endoscopy;  Laterality: N/A;   ELBOW SURGERY Right    Has had 4 surgeries on right elbow.   HAND SURGERY Right 2008   IR CV LINE INJECTION  09/10/2021   JOINT REPLACEMENT     LIVER BIOPSY  10/2017   lung wedge resection  10/2017   PORTA CATH INSERTION N/A 01/29/2017   Procedure: Glori Luis Cath Insertion;  Surgeon: Algernon Huxley, MD;  Location: [place] [facility] [facility];  Service: Cardiovascular;  Laterality: N/A;   TONSILLECTOMY      Family History  Problem Relation Age of Onset   Lung cancer Mother 56   Esophageal cancer Father 8   Brain cancer Maternal Grandmother     Social History Social History   Tobacco Use   Smoking status: Every Day    Packs/day: 0.20    Years: 40.00    Total pack years: 8.00    Types: Cigarettes    Last attempt to quit: 07/25/2021    Years since quitting: 0.8   Smokeless tobacco: Never  Vaping Use   Vaping Use: Never used  Substance Use Topics   Alcohol use: No   Drug use: No    No Known Allergies  Current Facility-Administered Medications  Medication Dose Route Frequency Provider Last Rate Last Admin   0.9 %  sodium chloride infusion   Intravenous Continuous Schnier, Dolores Lory, MD 100 mL/hr at 06/18/22 1716 New Bag at 06/18/22 1716   0.9 %  sodium chloride infusion   Intravenous Continuous Schnier, Dolores Lory, MD       0.9 %  sodium chloride infusion  250 mL Intravenous PRN Schnier,  Dolores Lory, MD       acetaminophen (TYLENOL) tablet 650 mg  650 mg Oral Q6H PRN Schnier, Dolores Lory, MD       Or   acetaminophen (TYLENOL) suppository 650 mg  650 mg Rectal Q6H PRN Schnier, Dolores Lory, MD       albuterol (PROVENTIL) (2.5 MG/3ML) 0.083% nebulizer solution 3 mL  3 mL Inhalation Q2H PRN Schnier, Dolores Lory, MD   3 mL at 06/18/22 1524   azithromycin (ZITHROMAX) 500 mg in sodium chloride 0.9 % 250 mL IVPB  500 mg Intravenous Daily Delena Bali, RPH 250 mL/hr at 06/18/22 2321 500 mg at 06/18/22 2321   ceFEPIme (MAXIPIME) 2 g in sodium chloride 0.9 % 100 mL IVPB  2 g Intravenous Q8H Delena Bali, RPH 200 mL/hr at 06/18/22 2238 2 g at 06/18/22 2238   heparin ADULT infusion 100 units/mL (25000 units/26mL)  1,000 Units/hr Intravenous Continuous Delena Bali, RPH 10 mL/hr at 06/18/22 1920 1,000 Units/hr at 06/18/22 1920   HYDROmorphone (DILAUDID) injection 0.5-1 mg  0.5-1 mg Intravenous Q3H PRN Schnier, Dolores Lory, MD  0.5 mg at 06/18/22 2332   ipratropium-albuterol (DUONEB) 0.5-2.5 (3) MG/3ML nebulizer solution 3 mL  3 mL Nebulization Q6H PRN Schnier, Dolores Lory, MD       nicotine (NICODERM CQ - dosed in mg/24 hours) patch 21 mg  21 mg Transdermal Daily PRN Schnier, Dolores Lory, MD       ondansetron [facility]) injection 4 mg  4 mg Intravenous Q6H PRN Schnier, Dolores Lory, MD       ondansetron [facility]) tablet 4 mg  4 mg Oral Q6H PRN Schnier, Dolores Lory, MD       oxyCODONE (Oxy IR/ROXICODONE) immediate release tablet 5-10 mg  5-10 mg Oral Q4H PRN Schnier, Dolores Lory, MD   10 mg at 06/18/22 2141   potassium chloride SA (KLOR-CON M) CR tablet 40 mEq  40 mEq Oral Once Schnier, Dolores Lory, MD       senna-docusate (Senokot-S) tablet 1 tablet  1 tablet Oral QHS PRN Schnier, Dolores Lory, MD       sodium chloride flush (NS) 0.9 % injection 3 mL  3 mL Intravenous Q12H Schnier, Dolores Lory, MD   3 mL at 06/18/22 2240   sodium chloride flush (NS) 0.9 % injection 3 mL  3 mL Intravenous PRN Schnier,  Dolores Lory, MD       vancomycin (VANCOREADY) IVPB 1750 mg/350 mL  1,750 mg Intravenous Q24H Delena Bali, RPH       Facility-Administered Medications Ordered in Other Encounters  Medication Dose Route Frequency Provider Last Rate Last Admin   heparin lock flush 100 UNIT/ML injection              Review of Systems Full ROS  was asked and was negative except for the information on the HPI  Physical Exam Blood pressure 132/65, pulse 100, temperature 98.2 F (36.8 C), temperature source Oral, resp. rate 20, height 5\' 7"  (1.702 m), weight 81.5 kg, SpO2 96 %. CONSTITUTIONAL: ***. EYES: Pupils are equal, round, and reactive to light, Sclera are non-icteric. EARS, NOSE, MOUTH AND THROAT: The oropharynx is clear. The oral mucosa is pink and moist. Hearing is intact to voice. LYMPH NODES:  Lymph nodes in the neck are normal. RESPIRATORY:  Lungs are clear. There is normal respiratory effort, with equal breath sounds bilaterally, and without pathologic use of accessory muscles. CARDIOVASCULAR: Heart is regular without murmurs, gallops, or rubs. GI: The abdomen is *** soft, nontender, and nondistended. There are no palpable masses. There is no hepatosplenomegaly. There are normal bowel sounds in all quadrants. GU: Rectal deferred.   MUSCULOSKELETAL: Normal muscle strength and tone. No cyanosis or edema.   SKIN: Turgor is good and there are no pathologic skin lesions or ulcers. NEUROLOGIC: Motor and sensation is grossly normal. Cranial nerves are grossly intact. PSYCH:  Oriented to person, place and time. Affect is normal.  Data Reviewed *** I have personally reviewed the patient's imaging, laboratory findings and medical records.    Assessment    ***    Plan    ***     Time spent with the patient was *** minutes, with more than 50% of the time spent in face-to-face education, counseling and care coordination.     Caroleen Hamman, MD FACS General Surgeon 06/18/2022, 11:52 PM

## 2022-06-18 NOTE — Consult Note (Signed)
@LOGO @   MRN : [id number]  Tamara Hall is a 66 y.o. (09-Jul-1956) female who presents with chief complaint of check circulation.  History of Present Illness:   I am asked to evaluate the patient by Dr. Ellender Hose.  Patient is a 66 year old woman who presented to the emergency room with increasing pain of the left lower extremity.  She notes the pain began perhaps 2 weeks ago but has been steadily worsening.  It has become so severe she has been sleeping in a chair and is noted that her foot is ice cold.  She has been using a warming pad on it.  The pain became so intense today that she came to the emergency room.  In the emergency room a CT of the abdomen was obtained which demonstrates a 4.5 cm abdominal aortic aneurysm is an incidental finding associated with subtotal occlusion of the common iliac arteries bilaterally.  She also appears to have significant calcific plaque in the common femorals bilaterally.  As an associated issue she has a small bowel obstruction by CT and has not been eating for the past 7 to 10 days.  Every time she tries to eat she vomits.  She has noted very poor output in her ostomy.  She has been having some increasing abdominal pain.  She has also been having increased problems with her breathing this is also one of the reasons why she was sleeping in a chair.  She has been coughing more as well.  CT scan of the chest for possible PE demonstrates that her metastatic cancer burden has increased significantly in size and she now has significant left-sided pneumonia as well.  No outpatient medications have been marked as taking for the 06/18/22 encounter [facility] Encounter).    Past Medical History:  Diagnosis Date   Asthma    Cancer ([place])    colon, with mets to liver and left lung   Cough    Hypertension    Pneumonia    20+ years ago    Past Surgical History:  Procedure Laterality Date   ABDOMINAL HYSTERECTOMY     BREAST SURGERY     COLON  SURGERY  10/2017   [facility]   COLONOSCOPY WITH PROPOFOL N/A 01/14/2017   Procedure: COLONOSCOPY WITH PROPOFOL;  Surgeon: Lucilla Lame, MD;  Location: [facility] ENDOSCOPY;  Service: Endoscopy;  Laterality: N/A;   ELBOW SURGERY Right    Has had 4 surgeries on right elbow.   HAND SURGERY Right 2008   IR CV LINE INJECTION  09/10/2021   JOINT REPLACEMENT     LIVER BIOPSY  10/2017   lung wedge resection  10/2017   PORTA CATH INSERTION N/A 01/29/2017   Procedure: Glori Luis Cath Insertion;  Surgeon: Algernon Huxley, MD;  [facility];  Service: Cardiovascular;  Laterality: N/A;   TONSILLECTOMY      Social History Social History   Tobacco Use   Smoking status: Every Day    Packs/day: 0.20    Years: 40.00    Total pack years: 8.00    Types: Cigarettes    Last attempt to quit: 07/25/2021    Years since quitting: 0.8   Smokeless tobacco: Never  Vaping Use   Vaping Use: Never used  Substance Use Topics   Alcohol use: No   Drug use: No    Family History Family History  Problem Relation Age of Onset   Lung cancer  Mother 51   Esophageal cancer Father 53   Brain cancer Maternal Grandmother     No Known Allergies   REVIEW OF SYSTEMS (Negative unless checked)  Constitutional: [] Weight loss  [] Fever  [] Chills Cardiac: [] Chest pain   [] Chest pressure   [] Palpitations   [x] Shortness of breath when laying flat   [x] Shortness of breath with exertion. Vascular:  [x] Pain in legs with walking   grade pain in legs at rest  [] History of DVT   [] Phlebitis   [] Swelling in legs   [] Varicose veins   [] Non-healing ulcers Pulmonary:   [] Uses home oxygen   [] Productive cough   [] Hemoptysis   [] Wheeze  [] COPD   [] Asthma Neurologic:  [] Dizziness   [] Seizures   [] History of stroke   [] History of TIA  [] Aphasia   [] Vissual changes   [] Weakness or numbness in arm   [] Weakness or numbness in leg Musculoskeletal:   [] Joint swelling   [] Joint pain   [] Low back pain Hematologic:  [] Easy bruising  [] Easy  bleeding   [] Hypercoagulable state   [] Anemic Gastrointestinal:  [] Diarrhea   [x] Vomiting  [x] Gastroesophageal reflux/heartburn   [] Difficulty swallowing. Genitourinary:  [] Chronic kidney disease   [] Difficult urination  [] Frequent urination   [] Blood in urine Skin:  [] Rashes   [] Ulcers  Psychological:  [] History of anxiety   []  History of major depression.  Physical Examination  Vitals:   06/18/22 1615 06/18/22 1621 06/18/22 1630 06/18/22 1657  BP:   (!) 148/59 138/65  Pulse:   (!) 112 (!) 105  Resp:    (!) 23  Temp:  98.7 F (37.1 C)    TempSrc:  Oral    SpO2: 93%   93%  Weight:      Height:       Body mass index is 28.14 kg/m. Gen: WD/WN, NAD Head: [place]/AT, No temporalis wasting.  Ear/Nose/Throat: Hearing grossly intact, nares w/o erythema or drainage Eyes: PER, EOMI, sclera nonicteric.  Neck: Supple, no masses.  No bruit or JVD.  Pulmonary:  Good air movement, no audible wheezing, no use of accessory muscles.  Cardiac: RRR, normal S1, S2, no Murmurs. Vascular: Left leg is cold to the touch there is mottling consistent with ischemia associated with micro emboli.  The first and fifth toes appear ischemic but there are no open wounds at this time Vessel Right Left  Radial Palpable Palpable  Femoral Palpable Not palpable  PT Not Palpable Not Palpable  DP Not Palpable Not Palpable  Gastrointestinal: soft, non-distended. No guarding/no peritoneal signs.  Musculoskeletal: M/S 5/5 throughout.  No visible deformity.  Neurologic: CN 2-12 intact. Pain and light touch intact in extremities.  Symmetrical.  Speech is fluent. Motor exam as listed above. Psychiatric: Judgment intact, Mood & affect appropriate for pt's clinical situation. Dermatologic: No rashes or ulcers noted.  No changes consistent with cellulitis.   CBC Lab Results  Component Value Date   WBC 10.0 06/18/2022   HGB 13.8 06/18/2022   HCT 43.5 06/18/2022   MCV 88.8 06/18/2022   PLT 256 06/18/2022    BMET     Component Value Date/Time   NA 136 06/18/2022 1222   K 2.8 (L) 06/18/2022 1222   CL 90 (L) 06/18/2022 1222   CO2 35 (H) 06/18/2022 1222   GLUCOSE 115 (H) 06/18/2022 1222   BUN <5 (L) 06/18/2022 1222   CREATININE 0.56 06/18/2022 1222   CALCIUM 8.6 (L) 06/18/2022 1222   GFRNONAA >60 06/18/2022 1222   GFRAA >60 07/04/2020 1211  Estimated Creatinine Clearance: 76 mL/min (by C-G formula based on SCr of 0.56 mg/dL).  COAG Lab Results  Component Value Date   INR 1.1 06/18/2022    Radiology US Venous Img Lower Bilateral  Result Date: 06/18/2022 CLINICAL DATA:  Bilateral leg pain EXAM: BILATERAL LOWER EXTREMITY VENOUS DOPPLER ULTRASOUND TECHNIQUE: Gray-scale sonography with graded compression, as well as color Doppler and duplex ultrasound were performed to evaluate the lower extremity deep venous systems from the level of the common femoral vein and including the common femoral, femoral, profunda femoral, popliteal and calf veins including the posterior tibial, peroneal and gastrocnemius veins when visible. The superficial great saphenous vein was also interrogated. Spectral Doppler was utilized to evaluate flow at rest and with distal augmentation maneuvers in the common femoral, femoral and popliteal veins. COMPARISON:  None Available. FINDINGS: RIGHT LOWER EXTREMITY Common Femoral Vein: No evidence of thrombus. Normal compressibility, respiratory phasicity and response to augmentation. Saphenofemoral Junction: No evidence of thrombus. Normal compressibility and flow on color Doppler imaging. Profunda Femoral Vein: No evidence of thrombus. Normal compressibility and flow on color Doppler imaging. Femoral Vein: No evidence of thrombus. Normal compressibility, respiratory phasicity and response to augmentation. Popliteal Vein: No evidence of thrombus. Normal compressibility, respiratory phasicity and response to augmentation. Calf Veins: No evidence of thrombus. Normal compressibility and flow on  color Doppler imaging. Other: Optimal 5.5 x 1.8 x 4.2 cm fluid collection in the right popliteal fossa, compatible with a Baker's cyst. LEFT LOWER EXTREMITY Common Femoral Vein: No evidence of thrombus. Normal compressibility, respiratory phasicity and response to augmentation. Saphenofemoral Junction: No evidence of thrombus. Normal compressibility and flow on color Doppler imaging. Profunda Femoral Vein: No evidence of thrombus. Normal compressibility and flow on color Doppler imaging. Femoral Vein: No evidence of thrombus. Normal compressibility, respiratory phasicity and response to augmentation. Popliteal Vein: No evidence of thrombus. Normal compressibility, respiratory phasicity and response to augmentation. Calf Veins: No evidence of thrombus. Normal compressibility and flow on color Doppler imaging. IMPRESSION: 1. No evidence of deep venous thrombosis in either lower extremity. 2. Approximately 5.5 cm probable right Baker's cyst. Electronically Signed   By: Margaretha Sheffield M.D.   On: 06/18/2022 15:43   CT ABDOMEN PELVIS W CONTRAST  Result Date: 06/18/2022 CLINICAL DATA:  Abdominal pain, shortness of breath EXAM: CT ABDOMEN AND PELVIS WITH CONTRAST TECHNIQUE: Multidetector CT imaging of the abdomen and pelvis was performed using the standard protocol following bolus administration of intravenous contrast. RADIATION DOSE REDUCTION: This exam was performed according to the departmental dose-optimization program which includes automated exposure control, adjustment of the mA and/or kV according to patient size and/or use of iterative reconstruction technique. CONTRAST:  81mL OMNIPAQUE IOHEXOL 350 MG/ML SOLN COMPARISON:  Previous studies including the examination of 11/02/2021 FINDINGS: Hepatobiliary: There is 2.9 cm low-density lesion in liver with no significant change, possibly residual finding from previously treated neoplastic process. There is subcentimeter low-density in the right lobe with no  significant change. There are multiple calcified granulomas. Pancreas: No focal abnormalities are seen. Spleen: There are calcified granulomas. Adrenals/Urinary Tract: Adrenals are unremarkable. There is no hydronephrosis. There are small calcifications in left renal hilum, possibly arterial calcifications. Ureters are not dilated. Urinary bladder is distended. Stomach/Bowel: Stomach is unremarkable. There is dilation of small-bowel loops measuring up to 4.1 cm in diameter. There is decompression of distal small bowel loops. Level of obstruction is possibly in left mid abdomen. There is previous resection of sigmoid colon with colostomy left lower quadrant. There is  herniation of portion of: In parastomal location. Vascular/Lymphatic: There is infrarenal abdominal aortic aneurysm measuring 4.5 x 4.4 cm. It measured 4.1 cm in diameter in the previous study. Part of the lumen is filled with thrombus. Severe atherosclerotic changes are noted in the proximal courses of both common iliac arteries. There is no retroperitoneal hematoma. Reproductive: Uterus is not seen. Presacral soft tissue thickening has not changed. Other: There is no ascites or pneumoperitoneum. Musculoskeletal: No significant interval changes are noted. IMPRESSION: There is abnormal dilation of proximal small bowel loops measuring up to 4.1 cm in diameter with decompression of distal small bowel loops suggesting partial small bowel obstruction. This may be due to neoplastic process or adhesions. There is no pneumoperitoneum. There is no hydronephrosis. There is interval increase in size of infrarenal abdominal aortic aneurysm. Aneurysm in the current study measures 4.5 cm. Aneurysm measured 4.1 cm in diameter in the study done on 11/25/2021. Other findings as described in the body of the report. Electronically Signed   By: Elmer Picker M.D.   On: 06/18/2022 15:34   CT Angio Chest PE W and/or Wo Contrast  Result Date: 06/18/2022 CLINICAL  DATA:  Shortness of breath EXAM: CT ANGIOGRAPHY CHEST WITH CONTRAST TECHNIQUE: Multidetector CT imaging of the chest was performed using the standard protocol during bolus administration of intravenous contrast. Multiplanar CT image reconstructions and MIPs were obtained to evaluate the vascular anatomy. RADIATION DOSE REDUCTION: This exam was performed according to the departmental dose-optimization program which includes automated exposure control, adjustment of the mA and/or kV according to patient size and/or use of iterative reconstruction technique. CONTRAST:  35mL OMNIPAQUE IOHEXOL 350 MG/ML SOLN COMPARISON:  Previous studies including the CT done on 2021/11/25 and chest radiographs done on 06/18/2022 FINDINGS: Cardiovascular: There is moderate pericardial effusion. Coronary artery calcifications are seen. There is homogeneous enhancement in thoracic aorta. There are no intraluminal filling defects in pulmonary artery branches. Mediastinum/Nodes: There are slightly enlarged lymph nodes in mediastinum with no significant interval change. There are calcified nodes in subcarinal region with no significant change. Lungs/Pleura: There is interval increase in size and number of numerous metastatic foci in both lungs There is new patchy ground-glass infiltrate in right upper lobe and right middle lobe There is a pleural-based mass in posterior left lower lung field with interval increase in size. The lesion measures approximately 7.3 cm in maximum diameter. There is a large lytic lesion in the posterior left eighth rib with interval progression. Upper Abdomen: There is an ill-defined 2.5 cm low-density in the liver in image 126 of series 2, possibly hepatic metastatic disease or residual change from previous therapy for metastatic disease. There are multiple calcified granulomas in liver and spleen. Musculoskeletal: There is interval progression of metastatic disease in the posterior left eighth rib. No new focal  lytic lesions are seen. Review of the MIP images confirms the above findings. IMPRESSION: There is no evidence of pulmonary artery embolism. There is no evidence of thoracic aortic dissection. There is a large lytic lesion in the posterior left eighth rib consistent with metastatic disease with interval progression. There is a large 7.3 cm soft tissue mass associated with the lytic lesion in left lower lobe with interval increase in size suggesting progression of metastatic disease. There is interval increase in size and number of multiple metastatic foci in both lungs suggesting interval progression of pulmonary metastatic disease. Moderate to large pericardial effusion.  Coronary artery disease. Electronically Signed   By: Prudy Feeler.D.  On: 06/18/2022 15:17   DG Chest 2 View  Result Date: 06/18/2022 CLINICAL DATA:  History of colon cancer.  Shortness of breath. EXAM: CHEST - 2 VIEW COMPARISON:  June 06, 2021. FINDINGS: Stable cardiomediastinal silhouette. Right-sided Port-A-Cath is unchanged in position. Bilateral pulmonary nodules are noted consistent with metastatic disease. Left basilar atelectasis or infiltrate is noted with associated pleural effusion. Bony thorax is unremarkable. IMPRESSION: Bilateral pulmonary nodules are noted consistent with metastatic disease. Left basilar atelectasis or infiltrate is noted with associated pleural effusion. Electronically Signed   By: Marijo Conception M.D.   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  This represents a limb threatening ischemia and places the patient at the risk for left limb loss.  Patient should undergo angiography of the left lower extremity with the  hope for intervention for limb salvage.  The risks and benefits as well as the alternative therapies was discussed in detail with the patient.  All questions were answered.  Patient agrees to proceed with left lower extremity angiography.  The patient will follow up with me in the office after the procedure.     Abdominal aortic aneurysm: Recommend: No surgery or intervention is indicated at this time.  The patient has an asymptomatic abdominal aortic aneurysm that is greater than 4 cm but less than 5 cm in maximal diameter.  However the patient's aneurysm has increased in size significantly which would warrant repair sooner than later but consideration was also be given to her comorbidities at this time.  Nevertheless this is not the appropriate time to fix her aneurysm and we will continue to follow this.  I have reviewed the natural history of abdominal aortic aneurysm and the small risk of rupture for aneurysm less than 5 cm in size.  However, as these small aneurysms tend to enlarge over time, continued surveillance with ultrasound or CT scan is mandatory.   I have also discussed optimizing medical management with hypertension and lipid control and the importance of abstinence from tobacco.  The patient is also encouraged to exercise a minimum of 30 minutes 4 times a week.   Should the patient develop new onset abdominal or back pain or signs of peripheral embolization they are instructed to seek medical attention immediately and to alert the physician providing care that they have an aneurysm.   The patient voices their understanding.  Small bowel obstruction: The patient has evidence of a small bowel obstruction associated with a parastomal hernia.  General Surgery consult is pending.  Further plans per the surgery service.  Given the severe ischemia of her left foot and the huge amount of pain that she is then we will move forward with revascularization in spite of this  comorbidity  Metastatic colon cancer: Patient with multiple consequences and complications secondary to her metastatic disease.  Dr. Rogue Bussing has been consulted.  Supportive care at this time.  Pneumonia: Patient has been ordered antibiotics.  We will treat her pneumonia unfortunately given her acute ischemia and her intense pain revascularization will be undertaken in the face of this comorbidity as well.  Hortencia Pilar, MD  06/18/2022 5:05 PM

## 2022-06-18 NOTE — H&P (View-Only) (Signed)
@LOGO @   MRN : [id number]  Tamara Hall is a 66 y.o. (06-28-1956) female who presents with chief complaint of check circulation.  History of Present Illness:   I am asked to evaluate the patient by Dr. Ellender Hose.  Patient is a 66 year old woman who presented to the emergency room with increasing pain of the left lower extremity.  She notes the pain began perhaps 2 weeks ago but has been steadily worsening.  It has become so severe she has been sleeping in a chair and is noted that her foot is ice cold.  She has been using a warming pad on it.  The pain became so intense today that she came to the emergency room.  In the emergency room a CT of the abdomen was obtained which demonstrates a 4.5 cm abdominal aortic aneurysm is an incidental finding associated with subtotal occlusion of the common iliac arteries bilaterally.  She also appears to have significant calcific plaque in the common femorals bilaterally.  As an associated issue she has a small bowel obstruction by CT and has not been eating for the past 7 to 10 days.  Every time she tries to eat she vomits.  She has noted very poor output in her ostomy.  She has been having some increasing abdominal pain.  She has also been having increased problems with her breathing this is also one of the reasons why she was sleeping in a chair.  She has been coughing more as well.  CT scan of the chest for possible PE demonstrates that her metastatic cancer burden has increased significantly in size and she now has significant left-sided pneumonia as well.  No outpatient medications have been marked as taking for the 06/18/22 encounter [facility] Encounter).    Past Medical History:  Diagnosis Date   Asthma    Cancer ([place])    colon, with mets to liver and left lung   Cough    Hypertension    Pneumonia    20+ years ago    Past Surgical History:  Procedure Laterality Date   ABDOMINAL HYSTERECTOMY     BREAST SURGERY     COLON  SURGERY  10/2017   [facility]   COLONOSCOPY WITH PROPOFOL N/A 01/14/2017   Procedure: COLONOSCOPY WITH PROPOFOL;  Surgeon: Lucilla Lame, MD;  Location: [facility] ENDOSCOPY;  Service: Endoscopy;  Laterality: N/A;   ELBOW SURGERY Right    Has had 4 surgeries on right elbow.   HAND SURGERY Right 2008   IR CV LINE INJECTION  09/10/2021   JOINT REPLACEMENT     LIVER BIOPSY  10/2017   lung wedge resection  10/2017   PORTA CATH INSERTION N/A 01/29/2017   Procedure: Glori Luis Cath Insertion;  Surgeon: Algernon Huxley, MD;  Location: [place] [facility];  Service: Cardiovascular;  Laterality: N/A;   TONSILLECTOMY      Social History Social History   Tobacco Use   Smoking status: Every Day    Packs/day: 0.20    Years: 40.00    Total pack years: 8.00    Types: Cigarettes    Last attempt to quit: 07/25/2021    Years since quitting: 0.8   Smokeless tobacco: Never  Vaping Use   Vaping Use: Never used  Substance Use Topics   Alcohol use: No   Drug use: No    Family History Family History  Problem Relation Age of Onset   Lung cancer  Mother 53   Esophageal cancer Father 4   Brain cancer Maternal Grandmother     No Known Allergies   REVIEW OF SYSTEMS (Negative unless checked)  Constitutional: [] Weight loss  [] Fever  [] Chills Cardiac: [] Chest pain   [] Chest pressure   [] Palpitations   [x] Shortness of breath when laying flat   [x] Shortness of breath with exertion. Vascular:  [x] Pain in legs with walking   grade pain in legs at rest  [] History of DVT   [] Phlebitis   [] Swelling in legs   [] Varicose veins   [] Non-healing ulcers Pulmonary:   [] Uses home oxygen   [] Productive cough   [] Hemoptysis   [] Wheeze  [] COPD   [] Asthma Neurologic:  [] Dizziness   [] Seizures   [] History of stroke   [] History of TIA  [] Aphasia   [] Vissual changes   [] Weakness or numbness in arm   [] Weakness or numbness in leg Musculoskeletal:   [] Joint swelling   [] Joint pain   [] Low back pain Hematologic:  [] Easy bruising  [] Easy  bleeding   [] Hypercoagulable state   [] Anemic Gastrointestinal:  [] Diarrhea   [x] Vomiting  [x] Gastroesophageal reflux/heartburn   [] Difficulty swallowing. Genitourinary:  [] Chronic kidney disease   [] Difficult urination  [] Frequent urination   [] Blood in urine Skin:  [] Rashes   [] Ulcers  Psychological:  [] History of anxiety   []  History of major depression.  Physical Examination  Vitals:   06/18/22 1615 06/18/22 1621 06/18/22 1630 06/18/22 1657  BP:   (!) 148/59 138/65  Pulse:   (!) 112 (!) 105  Resp:    (!) 23  Temp:  98.7 F (37.1 C)    TempSrc:  Oral    SpO2: 93%   93%  Weight:      Height:       Body mass index is 28.14 kg/m. Gen: WD/WN, NAD Head: [place]/AT, No temporalis wasting.  Ear/Nose/Throat: Hearing grossly intact, nares w/o erythema or drainage Eyes: PER, EOMI, sclera nonicteric.  Neck: Supple, no masses.  No bruit or JVD.  Pulmonary:  Good air movement, no audible wheezing, no use of accessory muscles.  Cardiac: RRR, normal S1, S2, no Murmurs. Vascular: Left leg is cold to the touch there is mottling consistent with ischemia associated with micro emboli.  The first and fifth toes appear ischemic but there are no open wounds at this time Vessel Right Left  Radial Palpable Palpable  Femoral Palpable Not palpable  PT Not Palpable Not Palpable  DP Not Palpable Not Palpable  Gastrointestinal: soft, non-distended. No guarding/no peritoneal signs.  Musculoskeletal: M/S 5/5 throughout.  No visible deformity.  Neurologic: CN 2-12 intact. Pain and light touch intact in extremities.  Symmetrical.  Speech is fluent. Motor exam as listed above. Psychiatric: Judgment intact, Mood & affect appropriate for pt's clinical situation. Dermatologic: No rashes or ulcers noted.  No changes consistent with cellulitis.   CBC Lab Results  Component Value Date   WBC 10.0 06/18/2022   HGB 13.8 06/18/2022   HCT 43.5 06/18/2022   MCV 88.8 06/18/2022   PLT 256 06/18/2022    BMET     Component Value Date/Time   NA 136 06/18/2022 1222   K 2.8 (L) 06/18/2022 1222   CL 90 (L) 06/18/2022 1222   CO2 35 (H) 06/18/2022 1222   GLUCOSE 115 (H) 06/18/2022 1222   BUN <5 (L) 06/18/2022 1222   CREATININE 0.56 06/18/2022 1222   CALCIUM 8.6 (L) 06/18/2022 1222   GFRNONAA >60 06/18/2022 1222   GFRAA >60 07/04/2020 1211  Estimated Creatinine Clearance: 76 mL/min (by C-G formula based on SCr of 0.56 mg/dL).  COAG Lab Results  Component Value Date   INR 1.1 06/18/2022    Radiology US Venous Img Lower Bilateral  Result Date: 06/18/2022 CLINICAL DATA:  Bilateral leg pain EXAM: BILATERAL LOWER EXTREMITY VENOUS DOPPLER ULTRASOUND TECHNIQUE: Gray-scale sonography with graded compression, as well as color Doppler and duplex ultrasound were performed to evaluate the lower extremity deep venous systems from the level of the common femoral vein and including the common femoral, femoral, profunda femoral, popliteal and calf veins including the posterior tibial, peroneal and gastrocnemius veins when visible. The superficial great saphenous vein was also interrogated. Spectral Doppler was utilized to evaluate flow at rest and with distal augmentation maneuvers in the common femoral, femoral and popliteal veins. COMPARISON:  None Available. FINDINGS: RIGHT LOWER EXTREMITY Common Femoral Vein: No evidence of thrombus. Normal compressibility, respiratory phasicity and response to augmentation. Saphenofemoral Junction: No evidence of thrombus. Normal compressibility and flow on color Doppler imaging. Profunda Femoral Vein: No evidence of thrombus. Normal compressibility and flow on color Doppler imaging. Femoral Vein: No evidence of thrombus. Normal compressibility, respiratory phasicity and response to augmentation. Popliteal Vein: No evidence of thrombus. Normal compressibility, respiratory phasicity and response to augmentation. Calf Veins: No evidence of thrombus. Normal compressibility and flow on  color Doppler imaging. Other: Optimal 5.5 x 1.8 x 4.2 cm fluid collection in the right popliteal fossa, compatible with a Baker's cyst. LEFT LOWER EXTREMITY Common Femoral Vein: No evidence of thrombus. Normal compressibility, respiratory phasicity and response to augmentation. Saphenofemoral Junction: No evidence of thrombus. Normal compressibility and flow on color Doppler imaging. Profunda Femoral Vein: No evidence of thrombus. Normal compressibility and flow on color Doppler imaging. Femoral Vein: No evidence of thrombus. Normal compressibility, respiratory phasicity and response to augmentation. Popliteal Vein: No evidence of thrombus. Normal compressibility, respiratory phasicity and response to augmentation. Calf Veins: No evidence of thrombus. Normal compressibility and flow on color Doppler imaging. IMPRESSION: 1. No evidence of deep venous thrombosis in either lower extremity. 2. Approximately 5.5 cm probable right Baker's cyst. Electronically Signed   By: Margaretha Sheffield M.D.   On: 06/18/2022 15:43   CT ABDOMEN PELVIS W CONTRAST  Result Date: 06/18/2022 CLINICAL DATA:  Abdominal pain, shortness of breath EXAM: CT ABDOMEN AND PELVIS WITH CONTRAST TECHNIQUE: Multidetector CT imaging of the abdomen and pelvis was performed using the standard protocol following bolus administration of intravenous contrast. RADIATION DOSE REDUCTION: This exam was performed according to the departmental dose-optimization program which includes automated exposure control, adjustment of the mA and/or kV according to patient size and/or use of iterative reconstruction technique. CONTRAST:  74mL OMNIPAQUE IOHEXOL 350 MG/ML SOLN COMPARISON:  Previous studies including the examination of 11/02/2021 FINDINGS: Hepatobiliary: There is 2.9 cm low-density lesion in liver with no significant change, possibly residual finding from previously treated neoplastic process. There is subcentimeter low-density in the right lobe with no  significant change. There are multiple calcified granulomas. Pancreas: No focal abnormalities are seen. Spleen: There are calcified granulomas. Adrenals/Urinary Tract: Adrenals are unremarkable. There is no hydronephrosis. There are small calcifications in left renal hilum, possibly arterial calcifications. Ureters are not dilated. Urinary bladder is distended. Stomach/Bowel: Stomach is unremarkable. There is dilation of small-bowel loops measuring up to 4.1 cm in diameter. There is decompression of distal small bowel loops. Level of obstruction is possibly in left mid abdomen. There is previous resection of sigmoid colon with colostomy left lower quadrant. There is  herniation of portion of: In parastomal location. Vascular/Lymphatic: There is infrarenal abdominal aortic aneurysm measuring 4.5 x 4.4 cm. It measured 4.1 cm in diameter in the previous study. Part of the lumen is filled with thrombus. Severe atherosclerotic changes are noted in the proximal courses of both common iliac arteries. There is no retroperitoneal hematoma. Reproductive: Uterus is not seen. Presacral soft tissue thickening has not changed. Other: There is no ascites or pneumoperitoneum. Musculoskeletal: No significant interval changes are noted. IMPRESSION: There is abnormal dilation of proximal small bowel loops measuring up to 4.1 cm in diameter with decompression of distal small bowel loops suggesting partial small bowel obstruction. This may be due to neoplastic process or adhesions. There is no pneumoperitoneum. There is no hydronephrosis. There is interval increase in size of infrarenal abdominal aortic aneurysm. Aneurysm in the current study measures 4.5 cm. Aneurysm measured 4.1 cm in diameter in the study done on 11-15-21. Other findings as described in the body of the report. Electronically Signed   By: Elmer Picker M.D.   On: 06/18/2022 15:34   CT Angio Chest PE W and/or Wo Contrast  Result Date: 06/18/2022 CLINICAL  DATA:  Shortness of breath EXAM: CT ANGIOGRAPHY CHEST WITH CONTRAST TECHNIQUE: Multidetector CT imaging of the chest was performed using the standard protocol during bolus administration of intravenous contrast. Multiplanar CT image reconstructions and MIPs were obtained to evaluate the vascular anatomy. RADIATION DOSE REDUCTION: This exam was performed according to the departmental dose-optimization program which includes automated exposure control, adjustment of the mA and/or kV according to patient size and/or use of iterative reconstruction technique. CONTRAST:  40mL OMNIPAQUE IOHEXOL 350 MG/ML SOLN COMPARISON:  Previous studies including the CT done on 2021/11/15 and chest radiographs done on 06/18/2022 FINDINGS: Cardiovascular: There is moderate pericardial effusion. Coronary artery calcifications are seen. There is homogeneous enhancement in thoracic aorta. There are no intraluminal filling defects in pulmonary artery branches. Mediastinum/Nodes: There are slightly enlarged lymph nodes in mediastinum with no significant interval change. There are calcified nodes in subcarinal region with no significant change. Lungs/Pleura: There is interval increase in size and number of numerous metastatic foci in both lungs There is new patchy ground-glass infiltrate in right upper lobe and right middle lobe There is a pleural-based mass in posterior left lower lung field with interval increase in size. The lesion measures approximately 7.3 cm in maximum diameter. There is a large lytic lesion in the posterior left eighth rib with interval progression. Upper Abdomen: There is an ill-defined 2.5 cm low-density in the liver in image 126 of series 2, possibly hepatic metastatic disease or residual change from previous therapy for metastatic disease. There are multiple calcified granulomas in liver and spleen. Musculoskeletal: There is interval progression of metastatic disease in the posterior left eighth rib. No new focal  lytic lesions are seen. Review of the MIP images confirms the above findings. IMPRESSION: There is no evidence of pulmonary artery embolism. There is no evidence of thoracic aortic dissection. There is a large lytic lesion in the posterior left eighth rib consistent with metastatic disease with interval progression. There is a large 7.3 cm soft tissue mass associated with the lytic lesion in left lower lobe with interval increase in size suggesting progression of metastatic disease. There is interval increase in size and number of multiple metastatic foci in both lungs suggesting interval progression of pulmonary metastatic disease. Moderate to large pericardial effusion.  Coronary artery disease. Electronically Signed   By: Prudy Feeler.D.  On: 06/18/2022 15:17   DG Chest 2 View  Result Date: 06/18/2022 CLINICAL DATA:  History of colon cancer.  Shortness of breath. EXAM: CHEST - 2 VIEW COMPARISON:  June 06, 2021. FINDINGS: Stable cardiomediastinal silhouette. Right-sided Port-A-Cath is unchanged in position. Bilateral pulmonary nodules are noted consistent with metastatic disease. Left basilar atelectasis or infiltrate is noted with associated pleural effusion. Bony thorax is unremarkable. IMPRESSION: Bilateral pulmonary nodules are noted consistent with metastatic disease. Left basilar atelectasis or infiltrate is noted with associated pleural effusion. Electronically Signed   By: Marijo Conception M.D.   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  This represents a limb threatening ischemia and places the patient at the risk for left limb loss.  Patient should undergo angiography of the left lower extremity with the  hope for intervention for limb salvage.  The risks and benefits as well as the alternative therapies was discussed in detail with the patient.  All questions were answered.  Patient agrees to proceed with left lower extremity angiography.  The patient will follow up with me in the office after the procedure.     Abdominal aortic aneurysm: Recommend: No surgery or intervention is indicated at this time.  The patient has an asymptomatic abdominal aortic aneurysm that is greater than 4 cm but less than 5 cm in maximal diameter.  However the patient's aneurysm has increased in size significantly which would warrant repair sooner than later but consideration was also be given to her comorbidities at this time.  Nevertheless this is not the appropriate time to fix her aneurysm and we will continue to follow this.  I have reviewed the natural history of abdominal aortic aneurysm and the small risk of rupture for aneurysm less than 5 cm in size.  However, as these small aneurysms tend to enlarge over time, continued surveillance with ultrasound or CT scan is mandatory.   I have also discussed optimizing medical management with hypertension and lipid control and the importance of abstinence from tobacco.  The patient is also encouraged to exercise a minimum of 30 minutes 4 times a week.   Should the patient develop new onset abdominal or back pain or signs of peripheral embolization they are instructed to seek medical attention immediately and to alert the physician providing care that they have an aneurysm.   The patient voices their understanding.  Small bowel obstruction: The patient has evidence of a small bowel obstruction associated with a parastomal hernia.  General Surgery consult is pending.  Further plans per the surgery service.  Given the severe ischemia of her left foot and the huge amount of pain that she is then we will move forward with revascularization in spite of this  comorbidity  Metastatic colon cancer: Patient with multiple consequences and complications secondary to her metastatic disease.  Dr. Rogue Bussing has been consulted.  Supportive care at this time.  Pneumonia: Patient has been ordered antibiotics.  We will treat her pneumonia unfortunately given her acute ischemia and her intense pain revascularization will be undertaken in the face of this comorbidity as well.  Hortencia Pilar, MD  06/18/2022 5:05 PM

## 2022-06-18 NOTE — H&P (Addendum)
History and Physical   VERNIS CABACUNGAN HCW:[id number] DOB: Nov 24, 1955 DOA: 06/18/2022  PCP: Loni Muse, MD  Patient coming from: Home  I have personally briefly reviewed patient's old medical records in [place].  Chief Concern: Shortness of breath, left fifth toe pain  HPI: 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  She reports the pain is 10 out of 10.  She endorses cough.  Respiration rate was 19, heart rate of what was 107, blood pressure 140/65, SPO2 of 91% on 2 L nasal cannula.  Serum sodium is 136, potassium 2.8, chloride 90, bicarb 35, BUN of 25, serum creatinine of 0.56, GFR greater than 60, nonfasting blood glucose 115, WBC 10, hemoglobin 13.8, platelets of 256.  BNP is 273.  High sensitive troponin was 30.  ED treatment: Dilaudid x2, DuoNeb's x2, Solu-Medrol 125 mg IV one-time dose, ondansetron 4 mg IV, potassium chloride 40 mill equivalent, sodium chloride 1 L bolus.  At bedside patient was able to tell me her name, her age, the current calendar year.  She knows she is in the hospital.  She reports worsening shortness of breath and cough that is productive.  She denies fever at home.  She takes her home Dilaudid 8 mg every 4 hours without much relief.  She denies trauma to her person.  She reports the left lower extremity toe has been hurting for the past few weeks.  She denies trauma.  She reports the pain in her left toe is 10/10 and unbearable.  She cannot tolerate any palpation of her skin surrounding the area.  There is a pulse appreciated and it is more On exam.  She denies chest pain.  ROS: Constitutional: no weight change, no fever ENT/Mouth: no sore throat, no rhinorrhea Eyes: no eye pain, no vision changes Cardiovascular: no chest pain, no dyspnea,  no  edema, no palpitations Respiratory: no cough, no sputum, no wheezing Gastrointestinal: no nausea, no vomiting, no diarrhea, no constipation Genitourinary: no urinary incontinence, no dysuria, no hematuria Musculoskeletal: no arthralgias, no myalgias Skin: no skin lesions, no pruritus, Neuro: + weakness, no loss of consciousness, no syncope Psych: no anxiety, no depression, + decrease appetite Heme/Lymph: no bruising, no bleeding  ED Course: Gust with emergency medicine provider, patient requiring hospitalization for chief concerns of shortness of breath concerning for pneumonia.  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)   Hypokalemia   Partial small bowel obstruction (HCC)   Stage 3 severe COPD by GOLD classification ([place])   Acute respiratory failure with hypoxia (HCC)   Aspiration pneumonia (HCC)   Acute pain of left foot   Assessment and Plan:  * Multifocal pneumonia - Worse on left lower lobe - Cefepime, vancomycin per pharmacy - Added azithromycin for atypical coverage - Check procalcitonin on admission in the next 2 days to assess antibiotic effects - Check MRSA PCR  Acute pain of left foot - Decreased pallor of the left fifth toe - Concerning for ischemic left fifth toe - Vascular has been consulted - Patient is not a surgical candidate - Per vascular, recommend heparin GTT  Stage 3 severe  COPD by GOLD classification ([place]) -Status post DuoNebs in the ED - Albuterol inhalation as needed ordered - DuoNebs as needed every 6 hours for shortness of breath ordered  Partial small bowel obstruction (HCC) - Currently asymptomatic - Routine consultation placed for general surgery  Hypokalemia - Check magnesium - Potassium chloride 40 mill equivalent one-time dose per EDP, potassium  chloride 10 mill equivalent IV x2 - Order additional potassium chloride 40 mill: P.o. one-time dose - BMP in the a.m.  Malignant neoplasm metastatic to liver [facility]) - Palliative care has been consulted  Tobacco use - As needed Nicotine patch for craving ordered  CODE STATUS - extensive discussion regarding CODE STATUS, patient confirms that she is DNR/DNI with nursing staff at bedside  Chart reviewed.   DVT prophylaxis: Heparin GTT Code Status: DNR/DNI Diet: N.p.o. except for sips with meds Family Communication: I called patient's sister listed on the phone.  She states that her daughter, Rueben Bash is the POA for patient. She preferred that I speak with Melissa regarding prognosis and medical updates as her daughter understands more.  I attempted to call Melissa at the phone number [phone], no pickup. Disposition Plan: Poor prognosis Consults called: Vascular, general surgery Admission status: Inpatient, telemetry cardiac  Past Medical History:  Diagnosis Date   Asthma    Cancer ([place])    colon, with mets to liver and left lung   Cough    Hypertension    Pneumonia    20+ years ago   Past Surgical History:  Procedure Laterality Date   ABDOMINAL HYSTERECTOMY     BREAST SURGERY     COLON SURGERY  10/2017   [facility]   COLONOSCOPY WITH PROPOFOL N/A 01/14/2017   Procedure: COLONOSCOPY WITH PROPOFOL;  Surgeon: Lucilla Lame, MD;  Location: [facility] ENDOSCOPY;  Service: Endoscopy;  Laterality: N/A;   ELBOW SURGERY Right    Has had 4 surgeries on right elbow.   HAND SURGERY Right 2008   IR CV LINE INJECTION  09/10/2021   JOINT REPLACEMENT     LIVER BIOPSY  10/2017   lung wedge resection  10/2017   PORTA CATH INSERTION N/A 01/29/2017   Procedure: Glori Luis Cath Insertion;  Surgeon: Algernon Huxley, MD;  Location: [place] CV LAB;  Service: Cardiovascular;  Laterality: N/A;   TONSILLECTOMY     Social History:  reports that she has been smoking cigarettes. She has a 8.00 pack-year  smoking history. She has never used smokeless tobacco. She reports that she does not drink alcohol and does not use drugs.  No Known Allergies Family History  Problem Relation Age of Onset   Lung cancer Mother 10   Esophageal cancer Father 66   Brain cancer Maternal Grandmother    Family history: Family history reviewed and not pertinent  Prior to Admission medications   Medication Sig Start Date End Date Taking? Authorizing Provider  albuterol (VENTOLIN HFA) 108 (90 Base) MCG/ACT inhaler Inhale 1-2 puffs into the lungs every 6 (six) hours as needed for wheezing or shortness of breath.  01/22/19   [provider]  amLODipine (NORVASC) 5 MG tablet Take 5 mg by mouth daily. 08/02/19   [provider]  baclofen (LIORESAL) 10 MG tablet Take 10 mg by mouth 3 (three) times daily.    [provider]  benzonatate (TESSALON) 200 MG capsule TAKE 1 CAPSULE (200 MG TOTAL) BY MOUTH 3 (THREE) TIMES DAILY AS NEEDED FOR COUGH. Patient not taking: Reported on 04/16/2022 01/21/22   Vernard Gambles  L, MD  Calcium Carbonate (CALCIUM 500 PO) Take 1 tablet by mouth daily.    [provider]  chlorpheniramine-HYDROcodone 10-8 MG/5ML Take 5 mLs by mouth every 12 (twelve) hours as needed for cough. 04/16/22   Cammie Sickle, MD  folic acid (FOLVITE) 1 MG tablet TAKE 1 TABLET BY MOUTH EVERY DAY 06/07/22   Cammie Sickle, MD  gabapentin (NEURONTIN) 300 MG capsule Take 1,200 mg by mouth 3 (three) times daily.  05/14/19 08/31/27  [provider]  HYDROcodone-acetaminophen (NORCO) 10-325 MG tablet Take 1 tablet by mouth every 4 (four) hours as needed for moderate pain or severe pain. Patient not taking: Reported on 04/16/2022 08/28/20   [provider]  HYDROmorphone HCl (DILAUDID-5 PO) Take by mouth.    [provider]  nortriptyline (PAMELOR) 25 MG capsule Take 25 mg by mouth at bedtime. 05/16/22   [provider]  omeprazole (PRILOSEC) 20 MG  capsule TAKE 1 CAPSULE BY MOUTH EVERY DAY 01/17/22   Cammie Sickle, MD  ondansetron (ZOFRAN ODT) 8 MG disintegrating tablet Take 1 tablet (8 mg total) by mouth every 8 (eight) hours as needed for nausea or vomiting. 05/13/22   Cammie Sickle, MD  Ostomy Supplies (PREMIER DRAINABLE POUCH [phone]) Pouch MISC  12/20/19   [provider]  Tiotropium Bromide-Olodaterol (STIOLTO RESPIMAT) 2.5-2.5 MCG/ACT AERS Inhale 2.5 mcg into the lungs 2 (two) times daily. 01/03/22   Tyler Pita, MD  valsartan (DIOVAN) 80 MG tablet Take 80 mg by mouth daily. 05/11/21   [provider]  zolpidem (AMBIEN) 5 MG tablet Take 5 mg by mouth at bedtime as needed. 10/16/21   [provider]   Physical Exam: Vitals:   06/18/22 1630 06/18/22 1657 06/18/22 1733 06/18/22 1746  BP: (!) 148/59 138/65    Pulse: (!) 112 (!) 105    Resp:  (!) 23    Temp:      TempSrc:      SpO2:  93% 93% 91%  Weight:      Height:       Constitutional: appears older than chronological age, frail, NAD, calm, comfortable Eyes: PERRL, lids and conjunctivae normal ENMT: Mucous membranes are moist. Posterior pharynx clear of any exudate or lesions. Age-appropriate dentition. Hearing appropriate Neck: normal, supple, no masses, no thyromegaly Respiratory: clear to auscultation bilaterally, no wheezing, no crackles. Normal respiratory effort. No accessory muscle use.  Cardiovascular: Regular rate and rhythm, no murmurs / rubs / gallops. No extremity edema. 2+ pedal pulses. No carotid bruits.  Abdomen: no tenderness, no masses palpated, no hepatosplenomegaly. Bowel sounds positive.  Musculoskeletal: no clubbing / cyanosis. No joint deformity upper and lower extremities. Good ROM, no contractures, no atrophy. Normal muscle tone.  Skin: no rashes, lesions, ulcers. No induration.  Mottled and blue appearing on the left fifth toe.  Pulses appreciated.     Neurologic: Sensation intact. Strength 5/5 in all bilateral  upper and right lower extremities. Psychiatric: Lacks judgment and insight. Alert and oriented x 3.  Agitated mood.  EKG: independently reviewed, showing sinus tachycardia with rate of 116, QTc 428  Chest x-ray on Admission: I personally reviewed and I agree with radiologist reading as below.  US Venous Img Lower Bilateral  Result Date: 06/18/2022 CLINICAL DATA:  Bilateral leg pain EXAM: BILATERAL LOWER EXTREMITY VENOUS DOPPLER ULTRASOUND TECHNIQUE: Gray-scale sonography with graded compression, as well as color Doppler and duplex ultrasound were performed to evaluate the lower extremity deep venous systems from the level  of the common femoral vein and including the common femoral, femoral, profunda femoral, popliteal and calf veins including the posterior tibial, peroneal and gastrocnemius veins when visible. The superficial great saphenous vein was also interrogated. Spectral Doppler was utilized to evaluate flow at rest and with distal augmentation maneuvers in the common femoral, femoral and popliteal veins. COMPARISON:  None Available. FINDINGS: RIGHT LOWER EXTREMITY Common Femoral Vein: No evidence of thrombus. Normal compressibility, respiratory phasicity and response to augmentation. Saphenofemoral Junction: No evidence of thrombus. Normal compressibility and flow on color Doppler imaging. Profunda Femoral Vein: No evidence of thrombus. Normal compressibility and flow on color Doppler imaging. Femoral Vein: No evidence of thrombus. Normal compressibility, respiratory phasicity and response to augmentation. Popliteal Vein: No evidence of thrombus. Normal compressibility, respiratory phasicity and response to augmentation. Calf Veins: No evidence of thrombus. Normal compressibility and flow on color Doppler imaging. Other: Optimal 5.5 x 1.8 x 4.2 cm fluid collection in the right popliteal fossa, compatible with a Baker's cyst. LEFT LOWER EXTREMITY Common Femoral Vein: No evidence of thrombus. Normal  compressibility, respiratory phasicity and response to augmentation. Saphenofemoral Junction: No evidence of thrombus. Normal compressibility and flow on color Doppler imaging. Profunda Femoral Vein: No evidence of thrombus. Normal compressibility and flow on color Doppler imaging. Femoral Vein: No evidence of thrombus. Normal compressibility, respiratory phasicity and response to augmentation. Popliteal Vein: No evidence of thrombus. Normal compressibility, respiratory phasicity and response to augmentation. Calf Veins: No evidence of thrombus. Normal compressibility and flow on color Doppler imaging. IMPRESSION: 1. No evidence of deep venous thrombosis in either lower extremity. 2. Approximately 5.5 cm probable right Baker's cyst. Electronically Signed   By: Margaretha Sheffield M.D.   On: 06/18/2022 15:43   CT ABDOMEN PELVIS W CONTRAST  Result Date: 06/18/2022 CLINICAL DATA:  Abdominal pain, shortness of breath EXAM: CT ABDOMEN AND PELVIS WITH CONTRAST TECHNIQUE: Multidetector CT imaging of the abdomen and pelvis was performed using the standard protocol following bolus administration of intravenous contrast. RADIATION DOSE REDUCTION: This exam was performed according to the departmental dose-optimization program which includes automated exposure control, adjustment of the mA and/or kV according to patient size and/or use of iterative reconstruction technique. CONTRAST:  33mL OMNIPAQUE IOHEXOL 350 MG/ML SOLN COMPARISON:  Previous studies including the examination of 11/02/2021 FINDINGS: Hepatobiliary: There is 2.9 cm low-density lesion in liver with no significant change, possibly residual finding from previously treated neoplastic process. There is subcentimeter low-density in the right lobe with no significant change. There are multiple calcified granulomas. Pancreas: No focal abnormalities are seen. Spleen: There are calcified granulomas. Adrenals/Urinary Tract: Adrenals are unremarkable. There is no  hydronephrosis. There are small calcifications in left renal hilum, possibly arterial calcifications. Ureters are not dilated. Urinary bladder is distended. Stomach/Bowel: Stomach is unremarkable. There is dilation of small-bowel loops measuring up to 4.1 cm in diameter. There is decompression of distal small bowel loops. Level of obstruction is possibly in left mid abdomen. There is previous resection of sigmoid colon with colostomy left lower quadrant. There is herniation of portion of: In parastomal location. Vascular/Lymphatic: There is infrarenal abdominal aortic aneurysm measuring 4.5 x 4.4 cm. It measured 4.1 cm in diameter in the previous study. Part of the lumen is filled with thrombus. Severe atherosclerotic changes are noted in the proximal courses of both common iliac arteries. There is no retroperitoneal hematoma. Reproductive: Uterus is not seen. Presacral soft tissue thickening has not changed. Other: There is no ascites or pneumoperitoneum. Musculoskeletal: No significant interval changes  are noted. IMPRESSION: There is abnormal dilation of proximal small bowel loops measuring up to 4.1 cm in diameter with decompression of distal small bowel loops suggesting partial small bowel obstruction. This may be due to neoplastic process or adhesions. There is no pneumoperitoneum. There is no hydronephrosis. There is interval increase in size of infrarenal abdominal aortic aneurysm. Aneurysm in the current study measures 4.5 cm. Aneurysm measured 4.1 cm in diameter in the study done on 11/15/21. Other findings as described in the body of the report. Electronically Signed   By: Elmer Picker M.D.   On: 06/18/2022 15:34   CT Angio Chest PE W and/or Wo Contrast  Result Date: 06/18/2022 CLINICAL DATA:  Shortness of breath EXAM: CT ANGIOGRAPHY CHEST WITH CONTRAST TECHNIQUE: Multidetector CT imaging of the chest was performed using the standard protocol during bolus administration of intravenous  contrast. Multiplanar CT image reconstructions and MIPs were obtained to evaluate the vascular anatomy. RADIATION DOSE REDUCTION: This exam was performed according to the departmental dose-optimization program which includes automated exposure control, adjustment of the mA and/or kV according to patient size and/or use of iterative reconstruction technique. CONTRAST:  22mL OMNIPAQUE IOHEXOL 350 MG/ML SOLN COMPARISON:  Previous studies including the CT done on Nov 15, 2021 and chest radiographs done on 06/18/2022 FINDINGS: Cardiovascular: There is moderate pericardial effusion. Coronary artery calcifications are seen. There is homogeneous enhancement in thoracic aorta. There are no intraluminal filling defects in pulmonary artery branches. Mediastinum/Nodes: There are slightly enlarged lymph nodes in mediastinum with no significant interval change. There are calcified nodes in subcarinal region with no significant change. Lungs/Pleura: There is interval increase in size and number of numerous metastatic foci in both lungs There is new patchy ground-glass infiltrate in right upper lobe and right middle lobe There is a pleural-based mass in posterior left lower lung field with interval increase in size. The lesion measures approximately 7.3 cm in maximum diameter. There is a large lytic lesion in the posterior left eighth rib with interval progression. Upper Abdomen: There is an ill-defined 2.5 cm low-density in the liver in image 126 of series 2, possibly hepatic metastatic disease or residual change from previous therapy for metastatic disease. There are multiple calcified granulomas in liver and spleen. Musculoskeletal: There is interval progression of metastatic disease in the posterior left eighth rib. No new focal lytic lesions are seen. Review of the MIP images confirms the above findings. IMPRESSION: There is no evidence of pulmonary artery embolism. There is no evidence of thoracic aortic dissection. There is a  large lytic lesion in the posterior left eighth rib consistent with metastatic disease with interval progression. There is a large 7.3 cm soft tissue mass associated with the lytic lesion in left lower lobe with interval increase in size suggesting progression of metastatic disease. There is interval increase in size and number of multiple metastatic foci in both lungs suggesting interval progression of pulmonary metastatic disease. Moderate to large pericardial effusion.  Coronary artery disease. Electronically Signed   By: Elmer Picker M.D.   On: 06/18/2022 15:17   DG Chest 2 View  Result Date: 06/18/2022 CLINICAL DATA:  History of colon cancer.  Shortness of breath. EXAM: CHEST - 2 VIEW COMPARISON:  June 06, 2021. FINDINGS: Stable cardiomediastinal silhouette. Right-sided Port-A-Cath is unchanged in position. Bilateral pulmonary nodules are noted consistent with metastatic disease. Left basilar atelectasis or infiltrate is noted with associated pleural effusion. Bony thorax is unremarkable. IMPRESSION: Bilateral pulmonary nodules are noted consistent with metastatic disease. Left  basilar atelectasis or infiltrate is noted with associated pleural effusion. Electronically Signed   By: Marijo Conception M.D.   On: 06/18/2022 13:08    Labs on Admission: I have personally reviewed following labs  CBC: Recent Labs  Lab 06/18/22 1222  WBC 10.0  HGB 13.8  HCT 43.5  MCV 88.8  PLT 826   Basic Metabolic Panel: Recent Labs  Lab 06/18/22 1222 06/18/22 1535  NA 136  --   K 2.8*  --   CL 90*  --   CO2 35*  --   GLUCOSE 115*  --   BUN <5*  --   CREATININE 0.56  --   CALCIUM 8.6*  --   MG  --  1.7   GFR: Estimated Creatinine Clearance: 76 mL/min (by C-G formula based on SCr of 0.56 mg/dL).  Liver Function Tests: Recent Labs  Lab 06/18/22 1222  AST 18  ALT 12  ALKPHOS 108  BILITOT 0.5  PROT 6.2*  ALBUMIN 2.8*   Coagulation Profile: Recent Labs  Lab 06/18/22 1223  INR 1.1    Urine analysis:    Component Value Date/Time   COLORURINE YELLOW (A) 06/06/2021 1315   APPEARANCEUR HAZY (A) 06/06/2021 1315   LABSPEC 1.009 06/06/2021 1315   PHURINE 6.0 06/06/2021 1315   GLUCOSEU NEGATIVE 06/06/2021 1315   HGBUR SMALL (A) 06/06/2021 1315   BILIRUBINUR NEGATIVE 06/06/2021 1315   [place] 06/06/2021 1315   PROTEINUR NEGATIVE 06/06/2021 1315   NITRITE NEGATIVE 06/06/2021 1315   LEUKOCYTESUR LARGE (A) 06/06/2021 1315   CRITICAL CARE Performed by: Dr. Tobie Poet  Total critical care time: 35 minutes  Critical care time was exclusive of separately billable procedures and treating other patients.  Critical care was necessary to treat or prevent imminent or life-threatening deterioration.  Critical care was time spent personally by me on the following activities: development of treatment plan with patient and/or surrogate as well as nursing, discussions with consultants, evaluation of patient's response to treatment, examination of patient, obtaining history from patient or surrogate, ordering and performing treatments and interventions, ordering and review of laboratory studies, ordering and review of radiographic studies, pulse oximetry and re-evaluation of patient's condition.  Dr. Tobie Poet [facility]  If 7PM-7AM, please contact overnight-coverage provider If 7AM-7PM, please contact day coverage provider [email or web address]  06/18/2022, 5:57 PM

## 2022-06-18 NOTE — Assessment & Plan Note (Signed)
-   As needed Nicotine patch for craving ordered

## 2022-06-18 NOTE — Assessment & Plan Note (Signed)
-  Status post DuoNebs in the ED - Albuterol inhalation as needed ordered - DuoNebs as needed every 6 hours for shortness of breath ordered

## 2022-06-18 NOTE — Hospital Course (Addendum)
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  She reports the pain is 10 out of 10.  She endorses cough.  Respiration rate was 19, heart rate of what was 107, blood pressure 140/65, SPO2 of 91% on 2 L nasal cannula.  Serum sodium is 136, potassium 2.8, chloride 90, bicarb 35, BUN of 25, serum creatinine of 0.56, GFR greater than 60, nonfasting blood glucose 115, WBC 10, hemoglobin 13.8, platelets of 256.  BNP is 273.  High sensitive troponin was 30.  ED treatment: Dilaudid x2, DuoNeb's x2, Solu-Medrol 125 mg IV one-time dose, ondansetron 4 mg IV, potassium chloride 40 mill equivalent, sodium chloride 1 L bolus.

## 2022-06-18 NOTE — Progress Notes (Deleted)
PHARMACY -  BRIEF ANTIBIOTIC NOTE   Pharmacy has received consult(s) for Vancomycin and Cefepime from an ED provider.  The patient's profile has been reviewed for ht/wt/allergies/indication/available labs.    One time order(s) placed for Vancomycin 1750 mg IV x1, Cefepime 2g IV x1  Further antibiotics/pharmacy consults should be ordered by admitting physician if indicated.                       Thank you,  Gretel Acre, PharmD PGY1 Pharmacy Resident 06/18/2022 3:37 PM

## 2022-06-18 NOTE — Assessment & Plan Note (Signed)
-   Check magnesium - Potassium chloride 40 mill equivalent one-time dose per EDP, potassium chloride 10 mill equivalent IV x2 - Order additional potassium chloride 40 mill: P.o. one-time dose - BMP in the a.m.

## 2022-06-18 NOTE — ED Notes (Signed)
Pt. To rad.

## 2022-06-18 NOTE — Consult Note (Signed)
ANTICOAGULATION CONSULT NOTE - Follow Up Consult  Pharmacy Consult for heparin infusion Indication:  ischemia  No Known Allergies  Patient Measurements: Height: 5\' 7"  (170.2 cm) Weight: 81.5 kg (179 lb 10.8 oz) IBW/kg (Calculated) : 61.6 Heparin Dosing Weight: 78.4 kg  Vital Signs: BP: 123/57 (09/12 1430) Pulse Rate: 98 (09/12 1500)  Labs: Recent Labs    06/18/22 1222 06/18/22 1223  HGB 13.8  --   HCT 43.5  --   PLT 256  --   APTT  --  32  LABPROT  --  13.9  INR  --  1.1  CREATININE 0.56  --   TROPONINIHS 30*  --     Estimated Creatinine Clearance: 76 mL/min (by C-G formula based on SCr of 0.56 mg/dL).   Medications:  Scheduled:   potassium chloride  40 mEq Oral Once   Infusions:   azithromycin     potassium chloride     sodium chloride     PRN: acetaminophen **OR** acetaminophen, albuterol, fentaNYL (SUBLIMAZE) injection, HYDROmorphone (DILAUDID) injection, ondansetron **OR** ondansetron (ZOFRAN) IV, senna-docusate  Assessment: Tamara Hall is a 66 y.o. female presenting with increased SOB. Patient also reporting L 5th toe pain with increased erythema, found to have poor capillary refill on exam. PMH significant for rectal cancer stage IV, TUD, COPD, GERD. Patient not on anticoagulation per chart review. Pharmacy has been consulted to initiate and manage heparin infusion.   Baseline Labs: aPTT 32, PT 13.9, INR 1.1, Hgb 13.8, Hct 43.5, Plt 256  Goal of Therapy:  Heparin level 0.3-0.7 units/ml Monitor platelets by anticoagulation protocol: Yes   Plan:  No bolus per MD Start heparin infusion at 1000 units/hr Check anti-Xa level in 6 hours and daily while on heparin Continue to monitor H&H and platelets  Thank you for allowing pharmacy to be a part of this patient's care.   Gretel Acre, PharmD PGY1 Pharmacy Resident 06/18/2022 3:31 PM

## 2022-06-18 NOTE — Consult Note (Signed)
Pharmacy Antibiotic Note  Tamara Hall is a 66 y.o. female admitted on 06/18/2022 with pneumonia.  Patient presented with increased SOB. PMH significant for rectal cancer stage IV, TUD, COPD, GERD. Pharmacy has been consulted for Vancomycin and Cefepime dosing.  Plan: Day 1 of antibiotics Give Vancomycin 1750 mg IV x1 followed by 1750 mg IV Q24H. Goal AUC 400-550. Expected AUC: 495.1 Expected Css min: 10.4 SCr used: 0.8 (actual 0.56)  Weight used: IBW, Vd used: 0.72 (BMI 28) Initiate Cefepime 2g Q8H Continue to monitor renal function and culture results   Height: 5\' 7"  (170.2 cm) Weight: 81.5 kg (179 lb 10.8 oz) IBW/kg (Calculated) : 61.6  No data recorded.  Recent Labs  Lab 06/18/22 1222  WBC 10.0  CREATININE 0.56    Estimated Creatinine Clearance: 76 mL/min (by C-G formula based on SCr of 0.56 mg/dL).    No Known Allergies  Antimicrobials this admission: 9/12 Azithromycin >>  9/12 Vancomycin >>  9/12 Cefepime >>  Dose adjustments this admission: N/A  Microbiology results: 9/12 MRSA PCR: ordered  Thank you for allowing pharmacy to be a part of this patient's care.  Gretel Acre, PharmD PGY1 Pharmacy Resident 06/18/2022 3:54 PM

## 2022-06-18 NOTE — ED Notes (Signed)
Pt. To ED for SOB, and increased left foot/5th toe pain. Pt. States 5th toe has become more ruddy in color, slow cap refill and almost unbearable to touch.  Pt. Was diagnosed with color cancer, states the doctor gave her a life expectancy of October, this year. Pt. Has intense back pain, states it is "more of the same, and unbearable."

## 2022-06-18 NOTE — Assessment & Plan Note (Signed)
-   Worse on left lower lobe - Cefepime, vancomycin per pharmacy - Added azithromycin for atypical coverage - Check procalcitonin on admission in the next 2 days to assess antibiotic effects - Check MRSA PCR

## 2022-06-18 NOTE — ED Triage Notes (Signed)
Pt comes via [facility] from home with c/o increased SOb. Pt stats hx of colon cancer and given 6 months to live. Pt states she wears 2L [place] at home.   EMS reports 2L at 93% and increased to 3L now at 95%. Pt given 1 albuterol and 1 duoneb.   Pt did use inhaler at home with no relief. Pt states bilateral foot swelling and pain. Pt also states lower back pain. Cancer has spread into lungs.

## 2022-06-18 NOTE — Interval H&P Note (Signed)
History and Physical Interval Note:  06/18/2022 6:41 PM  Tamara Hall  has presented today for surgery, with the diagnosis of Left Leg pain.  The various methods of treatment have been discussed with the patient and family. After consideration of risks, benefits and other options for treatment, the patient has consented to  Procedure(s): Lower Extremity Angiography (Left) as a surgical intervention.  The patient's history has been reviewed, patient examined, no change in status, stable for surgery.  I have reviewed the patient's chart and labs.  Questions were answered to the patient's satisfaction.     Hortencia Pilar

## 2022-06-18 NOTE — Op Note (Signed)
[facility]  Percutaneous Study/Intervention Procedural Note   Date of Surgery: 06/18/2022  Surgeon:Matilda Fleig, Dolores Lory   Pre-operative Diagnosis: Atherosclerotic occlusive disease bilateral lower extremities with ischemic changes to the left foot and severe rest pain  Post-operative diagnosis:  Same  Procedure(s) Performed:  1.  Abdominal aortogram  2.  Bilateral distal runoff  3.  Percutaneous transluminal angioplasty and stent placement right common iliac artery; "kissing balloon" technique  4.  Percutaneous transluminal and plasty and stent placement left common iliac artery; "kissing balloon" technique  5.  Ultrasound guided access bilateral common femoral arteries  6.  StarClose closure device bilateral common femoral arteries  Anesthesia: Conscious sedation was administered under my direct supervision by the interventional radiology RN. IV Versed plus fentanyl were utilized. Continuous ECG, pulse oximetry and blood pressure was monitored throughout the entire procedure. Conscious sedation was for a total of 39 minutes and 16 seconds.  Sheath: Bilateral 23 cm 7 French Pinnacle sheaths retrograde common femoral arteries  Contrast: 65 cc  Fluoroscopy Time: 4.0 minutes  Indications: Patient presents with increasing pain of both lower extremities.  Physical examination as well as noninvasive studies demonstrate significant atherosclerotic occlusive disease.  Angiography with hope for intervention has been recommended.  Risks and benefits have been reviewed all questions have been answered alternative therapies have also been discussed patient has agreed to proceed.  Procedure:  Tamara Hall a 66 y.o. female who was identified and appropriate procedural time out was performed.  The patient was then placed supine on the table and prepped and draped in the usual sterile fashion.  Ultrasound was used to evaluate the right common femoral artery.  It was echolucent  and pulsatile indicating it is patent .  An ultrasound image was acquired for the permanent record.  A micropuncture needle was used to access the right common femoral artery under direct ultrasound guidance.  The microwire was then advanced under fluoroscopic guidance without difficulty followed by the micro-sheath  A 0.035 J wire was advanced without resistance and a 5Fr sheath was placed.    The pigtail catheter was then positioned at the level of T12 and an AP image of the aorta was obtained. After review the images the pigtail catheter was repositioned above the aortic bifurcation and bilateral oblique views of the pelvis were obtained. Subsequently the detector was returned to the AP position and bilateral lower extremity runoff was obtained.  After review the images the ultrasound was reprepped and delivered back onto the sterile field. The left common femoral was then imaged with the ultrasound it was noted to be echolucent and pulsatile indicating patency. Images recorded for the permanent record. Under real-time visualization a microneedle was inserted into the anterior wall the common femoral artery microwire was then advanced without difficulty under fluoroscopic guidance followed by placement of the micro-sheath.  A advantage wire was then negotiated under fluoroscopic guidance into the aorta.  23 cm 7 French sheath was then placed.  4,000 units of heparin was given and allowed to circulate for proximally 4 minutes.  The right sheath was then upsized to a 23 cm 7 French sheath as well after a advantage wire was advanced through the pigtail catheter. Magnified images of the aortic bifurcation were then made using hand injection contrast from the femoral sheaths. After appropriate sizing a 8 mm x 58 mm Lifestream  stent was selected for the right and a 8 mm x 58 mm Lifestream stent was selected for the left.  There were then advanced and positioned just above the aortic bifurcation. Insufflation  for full expansion of the stents was performed simultaneously. Follow-up imaging was then performed and there was significant disease at the distal end of the right stent and therefore a 9 mm x 36 mm lifestream stent was advanced up the right sheath and deployed covering this area.  Left lower extremity distal runoff was then performed through the left sheath by hand-injection.  Oblique views were then obtained of the groins in succession and Star close device is deployed without difficulty. There were no immediate complications   Findings:   Aortogram:  The abdominal aorta is opacified with a bolus injection contrast. Demonstrates diffuse disease with aneurysmal changes but there are no hemodynamically significant lesions noted until the distal aortic bifurcation where there is greater than 90% stenosis of the first several centimeters of the right common iliac artery and occlusion of the left common iliac artery.  The internal iliac arteries are patent on both sides and the external iliac arteries appear to be patent without hemodynamically significant stenoses on both sides.    Right Lower Extremity: The common femoral demonstrates moderate calcific plaque throughout its length but that does not appear to have a hemodynamically significant stenosis.  The visualized portions of the SFA and profunda femoris are patent.  Left Lower Extremity: There is a moderate amount of plaque throughout the left common femoral it does not appear to be hemodynamically significant.  The profunda femoris and SFA as well as the popliteal are diffusely diseased but there are no hemodynamically significant lesions.  The trifurcation is patent and there appears to be two-vessel runoff down to the ankle.  Following placement of the iliac stents there is now wide patency with less than 10% residual stenosis with rapid flow through the aortic bifurcation bilaterally.  Summary:  Successful reconstruction of the distal aorta  and bilateral iliac arteries  Disposition: Patient was taken to the recovery room in stable condition having tolerated the procedure well.  [name] Abrial Arrighi 06/18/2022,6:35 PM

## 2022-06-18 NOTE — Assessment & Plan Note (Addendum)
-   Currently asymptomatic - Routine consultation placed for general surgery

## 2022-06-19 ENCOUNTER — Inpatient Hospital Stay: Payer: Medicare HMO

## 2022-06-19 ENCOUNTER — Encounter: Payer: Self-pay | Admitting: Vascular Surgery

## 2022-06-19 ENCOUNTER — Inpatient Hospital Stay: Payer: Medicare HMO | Admitting: Internal Medicine

## 2022-06-19 DIAGNOSIS — J189 Pneumonia, unspecified organism: Secondary | ICD-10-CM

## 2022-06-19 DIAGNOSIS — Z515 Encounter for palliative care: Secondary | ICD-10-CM

## 2022-06-19 LAB — CBC
HCT: 39.5 % (ref 36.0–46.0)
Hemoglobin: 12.5 g/dL (ref 12.0–15.0)
MCH: 28.9 pg (ref 26.0–34.0)
MCHC: 31.6 g/dL (ref 30.0–36.0)
MCV: 91.2 fL (ref 80.0–100.0)
Platelets: 204 10*3/uL (ref 150–400)
RBC: 4.33 MIL/uL (ref 3.87–5.11)
RDW: 14.4 % (ref 11.5–15.5)
WBC: 6.1 10*3/uL (ref 4.0–10.5)
nRBC: 0 % (ref 0.0–0.2)

## 2022-06-19 LAB — HEPARIN LEVEL (UNFRACTIONATED)
Heparin Unfractionated: 0.23 IU/mL — ABNORMAL LOW (ref 0.30–0.70)
Heparin Unfractionated: 0.24 IU/mL — ABNORMAL LOW (ref 0.30–0.70)
Heparin Unfractionated: 0.41 IU/mL (ref 0.30–0.70)
Heparin Unfractionated: 0.39 IU/mL (ref 0.30–0.70)

## 2022-06-19 LAB — BASIC METABOLIC PANEL
Anion gap: 5 (ref 5–15)
BUN: <5 mg/dL — ABNORMAL LOW (ref 8–23)
CO2: 33 mmol/L — ABNORMAL HIGH (ref 22–32)
Calcium: 7.7 mg/dL — ABNORMAL LOW (ref 8.9–10.3)
Chloride: 100 mmol/L (ref 98–111)
Creatinine, Ser: 0.63 mg/dL (ref 0.44–1.00)
GFR, Estimated: >60 mL/min (ref >60)
Glucose, Bld: 143 mg/dL — ABNORMAL HIGH (ref 70–99)
Potassium: 3.9 mmol/L (ref 3.5–5.1)
Sodium: 138 mmol/L (ref 135–145)

## 2022-06-19 LAB — HIV ANTIBODY (ROUTINE TESTING W REFLEX): HIV Screen 4th Generation wRfx: NONREACTIVE

## 2022-06-19 LAB — PROCALCITONIN: Procalcitonin: <0.1 ng/mL

## 2022-06-19 MED ORDER — GABAPENTIN 100 MG PO CAPS
100.0000 mg | ORAL_CAPSULE | Freq: Every day | ORAL | Status: DC
  Administered 2022-06-19 – 2022-06-20 (×2): 100 mg via ORAL
  Filled 2022-06-19 (×2): qty 1

## 2022-06-19 MED ORDER — HYDROMORPHONE HCL 2 MG PO TABS
8.0000 mg | ORAL_TABLET | ORAL | Status: DC | PRN
  Administered 2022-06-19 – 2022-06-21 (×11): 8 mg via ORAL
  Filled 2022-06-19 (×11): qty 4

## 2022-06-19 MED ORDER — CHLORHEXIDINE GLUCONATE CLOTH 2 % EX PADS
6.0000 | MEDICATED_PAD | Freq: Every day | CUTANEOUS | Status: DC
  Administered 2022-06-19 – 2022-06-21 (×3): 6 via TOPICAL

## 2022-06-19 MED ORDER — HEPARIN BOLUS VIA INFUSION
1200.0000 [IU] | Freq: Once | INTRAVENOUS | Status: AC
  Administered 2022-06-19: 1200 [IU] via INTRAVENOUS
  Filled 2022-06-19: qty 1200

## 2022-06-19 MED ORDER — HEPARIN BOLUS VIA INFUSION
1150.0000 [IU] | Freq: Once | INTRAVENOUS | Status: AC
  Administered 2022-06-19: 1150 [IU] via INTRAVENOUS
  Filled 2022-06-19: qty 1150

## 2022-06-19 MED ORDER — IPRATROPIUM-ALBUTEROL 0.5-2.5 (3) MG/3ML IN SOLN: 3.0000 mL | Freq: Three times a day (TID) | RESPIRATORY_TRACT | Status: DC

## 2022-06-19 MED ORDER — MORPHINE SULFATE (PF) 4 MG/ML IV SOLN
4.0000 mg | INTRAVENOUS | Status: DC | PRN
  Administered 2022-06-19 – 2022-06-21 (×6): 4 mg via INTRAVENOUS
  Filled 2022-06-19 (×6): qty 1

## 2022-06-19 MED ORDER — IPRATROPIUM-ALBUTEROL 0.5-2.5 (3) MG/3ML IN SOLN
3.0000 mL | Freq: Four times a day (QID) | RESPIRATORY_TRACT | Status: DC
  Administered 2022-06-19 – 2022-06-21 (×5): 3 mL via RESPIRATORY_TRACT
  Filled 2022-06-19 (×7): qty 3

## 2022-06-19 MED ORDER — METHYLPREDNISOLONE SODIUM SUCC 40 MG IJ SOLR
40.0000 mg | Freq: Every day | INTRAMUSCULAR | Status: DC
  Administered 2022-06-19 – 2022-06-20 (×2): 40 mg via INTRAVENOUS
  Filled 2022-06-19 (×2): qty 1

## 2022-06-19 MED ORDER — ALPRAZOLAM 0.5 MG PO TABS
0.5000 mg | ORAL_TABLET | Freq: Three times a day (TID) | ORAL | Status: DC | PRN
  Administered 2022-06-19 – 2022-06-21 (×6): 0.5 mg via ORAL
  Filled 2022-06-19 (×6): qty 1

## 2022-06-19 NOTE — Progress Notes (Signed)
[facility]  Daily Progress Note   Subjective  -   Patient notes that bilateral toes are very sensitive but legs feel warm  Objective Vitals:   06/18/22 2323 06/19/22 0328 06/19/22 0746 06/19/22 1220  BP: 132/65 139/67 137/68 (!) 144/80  Pulse: 100 (!) 105 (!) 109 100  Resp: 20 18 18  (!) 24  Temp: 98.2 F (36.8 C) 98.5 F (36.9 C) 97.8 F (36.6 C) 97.6 F (36.4 C)  TempSrc: Oral Oral  Oral  SpO2: 96% 97% 94% 91%  Weight:      Height:        Intake/Output Summary (Last 24 hours) at 06/19/2022 1257 Last data filed at 06/19/2022 7342 Gross per 24 hour  Intake 2917.19 ml  Output 1900 ml  Net 1017.19 ml    PULM  CTAB CV  RRR VASC  2+ pedal pulses bilaterally  Laboratory CBC    Component Value Date/Time   WBC 6.1 06/19/2022 0135   HGB 12.5 06/19/2022 0135   HCT 39.5 06/19/2022 0135   PLT 204 06/19/2022 0135    BMET    Component Value Date/Time   NA 138 06/19/2022 0135   K 3.9 06/19/2022 0135   CL 100 06/19/2022 0135   CO2 33 (H) 06/19/2022 0135   GLUCOSE 143 (H) 06/19/2022 0135   BUN <5 (L) 06/19/2022 0135   CREATININE 0.63 06/19/2022 0135   CALCIUM 7.7 (L) 06/19/2022 0135   GFRNONAA >60 06/19/2022 0135   GFRAA >60 07/04/2020 1211    Assessment/Planning: POD #1 s/p bilateral iliac stents   Bilateral feet with strong pulses but patient continues to have pain in her toe area.  I discussed with patient that the pain may be related to nerve damage due to prolonged ischemia.  There is also concern for possible tissue damage as well.  The pain may improve following revascularization but unfortunately we will have to wait to determine if it improves.  Currently no discoloration so we are hopeful this is indicative of no tissue damage.   Kris Hartmann  06/19/2022, 12:57 PM

## 2022-06-19 NOTE — Progress Notes (Signed)
  Transition of Care [facility]) Screening Note   Patient Details  Name: Tamara Hall Date of Birth: 1955-10-28   [facility] of [facility]) CM/SW Contact:    Alberteen Sam, LCSW Phone Number: 06/19/2022, 9:50 AM    Transition of Care Department [facility]) has reviewed patient and no TOC needs have been identified at this time. We will continue to monitor patient advancement through interdisciplinary progression rounds. If new patient transition needs arise, please place a TOC consult.  [place], [place]

## 2022-06-19 NOTE — Consult Note (Signed)
ANTICOAGULATION CONSULT NOTE - Follow Up Consult  Pharmacy Consult for heparin infusion Indication:  ischemia  No Known Allergies  Patient Measurements: Height: 5\' 7"  (170.2 cm) Weight: 81.5 kg (179 lb 10.8 oz) IBW/kg (Calculated) : 61.6 Heparin Dosing Weight: 78.4 kg  Vital Signs: Temp: 97.8 F (36.6 C) (09/13 0746) Temp Source: Oral (09/13 0328) BP: 137/68 (09/13 0746) Pulse Rate: 109 (09/13 0746)  Labs: Recent Labs    06/18/22 1222 06/18/22 1223 06/18/22 1535 06/19/22 0135 06/19/22 0916  HGB 13.8  --   --  12.5  --   HCT 43.5  --   --  39.5  --   PLT 256  --   --  204  --   APTT  --  32  --   --   --   LABPROT  --  13.9  --   --   --   INR  --  1.1  --   --   --   HEPARINUNFRC  --   --   --  0.23* 0.24*  CREATININE 0.56  --   --  0.63  --   TROPONINIHS 30*  --  41*  --   --      Estimated Creatinine Clearance: 76 mL/min (by C-G formula based on SCr of 0.63 mg/dL).   Assessment: YOLANI VO is a 66 y.o. female presenting with increased SOB. Patient also reporting L 5th toe pain with increased erythema, found to have poor capillary refill on exam. PMH significant for rectal cancer stage IV, TUD, COPD, GERD. Patient not on anticoagulation per chart review. Pharmacy has been consulted to initiate and manage heparin infusion.  Baseline Labs: aPTT 32, PT 13.9, INR 1.1, Hgb 13.8, Hct 43.5, Plt 256  Goal of Therapy:  Heparin level 0.3-0.7 units/ml Monitor platelets by anticoagulation protocol: Yes  Date Time HL Rate/Comment 0913 0135 0.23 SUBtherapeutic; 1000 >> 1150 un/hr 0913 0916 0.24 SUBtherapeutic; 1150 >> 1300 un/hr   Plan: Give heparin bolus 1200 units x 1 Increase heparin infusion rate to 1300 un/hr Check HL in 6 hrs Continue to monitor CBC daily while on heparin  Dara Hoyer, PharmD PGY-1 Pharmacy Resident 06/19/2022 12:20 PM

## 2022-06-19 NOTE — Assessment & Plan Note (Deleted)
Stage IV rectal carcinoma: with History of  lung/liver metastases-s/p SBRT-left posterior chest wall lesion [July 09, 2021 finished].  Restaging CT scan- JAN 2023 shows progressive disease in bilateral lungs; and also increasing expansile size of the left rib lesion; no progression of disease in the liver.  Clinically suggestive of progression with any worsening symptoms-cough pain-see below  #Patient continues to decline any therapies as she is aware that all the treatments are palliative.  Patient has met with hospice; however-plan to hold off enrolling in hospice because of medication coverage issues.  #Pain control: [Dr.Chigdy; [facility]-pain clinic]-patient currently on Dilaudid 6 mg every 4-6 hours.  [as per patient; for her back pain]; currently with left posterior chest wall metastatic lesion-s/p SBRT-interestingly no significant response noted; worse followed by [facility] pain.  Patient awaiting reevaluation at [facility] in the next couple of weeks.  Defer to [facility] for pain control.  #Worsening cough-likely secondary to progressive malignancy in the lungs.  Recommend Tussionex new prescription sent.  # COPD: Continue inhalers;   # port : port flush q 6-8 weeks   # DISPOSITION: # follow up in 59month - MD; - labs- cbc/cmp/CEA; port flush; -;Dr.B

## 2022-06-19 NOTE — Progress Notes (Deleted)
[facility] NOTE  Patient Care Team: Loni Muse, MD as PCP - General (Internal Medicine) Stitzenberg, Clint Lipps, MD as Referring Physician (Surgical Oncology) Lequita Asal, MD (Inactive) as Referring Physician (Hematology and Oncology) Tyler Pita, MD as Consulting Physician (Pulmonary Disease) Cammie Sickle, MD as Medical Oncologist (Internal Medicine)  CHIEF COMPLAINTS/PURPOSE OF CONSULTATION: Rectal cancer metastatic  #  Oncology History Overview Note  She received 6 cycles of FOLFOX (02/18/2017 - 04/22/2017).               She underwent CT guided microwave ablation of the liver lesion on 06/06/2017.               She received radiation and daily Xeloda (06/18/2017- 08/15/2017).             She underwent APR with descending colostomy and VRAM flap on 10/24/2017.              She underwent a VATS wedge resection of her left lung.             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                         She declined left lower lobe lobectomy.                         She received 1 cycle of FOLFIRI (03/08/2020).                           She declined further chemotherapy or surgery.             She completed SBRT to the left infrahilar nodule on 05/22/2020.             CEA was 40.9 on 06/13/2020 and 9.5 on 10/31/2020.              Chest, abdomen, and pelvis CT on 10/31/2020 was personally reviewed.  Agree with radiology findings.   # SEP 2022-left posterior chest wall recurrent disease status post SBRT [finished OCT, 23rd, 2022] CT scan- JAN 2023 shows progressive disease in bilateral lungs; and also increasing expansile size of the left rib lesion; no progression of disease in the liver.   Rectal cancer ([place])  01/13/2017 Initial Diagnosis   Rectal cancer ([place])   03/08/2020 - 03/10/2020 Chemotherapy   Patient is on Treatment Plan : COLORECTAL FOLFIRI / BEVACIZUMAB Q14D     01/14/2022 -   Chemotherapy   Patient is on Treatment Plan : COLORECTAL FOLFOX + Bevacizumab q14d        HISTORY OF PRESENTING ILLNESS: Patient accompanied by his sister.  Walks with a rolling walker.  Tamara Hall 66 y.o.  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  Patient has declined chemotherapy.  Patient has met with hospice.  However not enrolled.  Complains of worsening cough.  Complains of rib pain on coughing.  She continues to have worsening back pain chest wall pain.  She is currently taking Dilaudid 6 mg every 4-6 hours.   Review of Systems  Constitutional:  Positive for malaise/fatigue. Negative for chills, diaphoresis, fever and weight loss.       Left posterior chest wall pain.  HENT:  Negative for nosebleeds and sore throat.   Eyes:  Negative  for double vision.  Respiratory:  Positive for cough and shortness of breath. Negative for hemoptysis, sputum production and wheezing.   Cardiovascular:  Negative for chest pain, palpitations, orthopnea and leg swelling.  Gastrointestinal:  Negative for abdominal pain, blood in stool, constipation, diarrhea, heartburn, melena, nausea and vomiting.  Genitourinary:  Negative for dysuria, frequency and urgency.  Musculoskeletal:  Positive for back pain and joint pain.  Skin: Negative.  Negative for itching and rash.  Neurological:  Negative for dizziness, tingling, focal weakness, weakness and headaches.  Endo/Heme/Allergies:  Does not bruise/bleed easily.  Psychiatric/Behavioral:  Negative for depression. The patient is not nervous/anxious and does not have insomnia.      MEDICAL HISTORY:  Past Medical History:  Diagnosis Date  . Asthma   . Cancer ([place])    colon, with mets to liver and left lung  . Cough   . Hypertension   . Pneumonia    20+ years ago    SURGICAL HISTORY: Past Surgical History:  Procedure Laterality Date  . ABDOMINAL HYSTERECTOMY    . BREAST SURGERY    .  COLON SURGERY  10/2017   [facility]  . COLONOSCOPY WITH PROPOFOL N/A 01/14/2017   Procedure: COLONOSCOPY WITH PROPOFOL;  Surgeon: Lucilla Lame, MD;  Location: [facility] ENDOSCOPY;  Service: Endoscopy;  Laterality: N/A;  . ELBOW SURGERY Right    Has had 4 surgeries on right elbow.  Marland Kitchen HAND SURGERY Right 2008  . IR CV LINE INJECTION  09/10/2021  . JOINT REPLACEMENT    . LIVER BIOPSY  10/2017  . LOWER EXTREMITY ANGIOGRAPHY Left 06/18/2022   Procedure: Lower Extremity Angiography;  Surgeon: Katha Cabal, MD;  Location: [facility];  Service: Cardiovascular;  Laterality: Left;  . lung wedge resection  10/2017  . PORTA CATH INSERTION N/A 01/29/2017   Procedure: Glori Luis Cath Insertion;  Surgeon: Algernon Huxley, MD;  Location: [facility];  Service: Cardiovascular;  Laterality: N/A;  . TONSILLECTOMY      SOCIAL HISTORY: Social History   Socioeconomic History  . Marital status: Divorced    Spouse name: Not on file  . Number of children: Not on file  . Years of education: Not on file  . Highest education level: Not on file  Occupational History  . Not on file  Tobacco Use  . Smoking status: Every Day    Packs/day: 0.20    Years: 40.00    Total pack years: 8.00    Types: Cigarettes    Last attempt to quit: 07/25/2021    Years since quitting: 0.9  . Smokeless tobacco: Never  Vaping Use  . Vaping Use: Never used  Substance and Sexual Activity  . Alcohol use: No  . Drug use: No  . Sexual activity: Not on file  Other Topics Concern  . Not on file  Social History Narrative  . Not on file   Social Determinants of Health   Financial Resource Strain: Not on file  Food Insecurity: No Food Insecurity (06/18/2022)   Hunger Vital Sign   . Worried About Charity fundraiser in the Last Year: Never true   . Ran Out of Food in the Last Year: Never true  Transportation Needs: No Transportation Needs (06/18/2022)   PRAPARE - Transportation   . Lack of Transportation (Medical): No   .  Lack of Transportation (Non-Medical): No  Physical Activity: Not on file  Stress: Not on file  Social Connections: Not on file  Intimate Partner Violence:  Not At Risk (06/18/2022)   Humiliation, Afraid, Rape, and Kick questionnaire   . Fear of Current or Ex-Partner: No   . Emotionally Abused: No   . Physically Abused: No   . Sexually Abused: No    FAMILY HISTORY: Family History  Problem Relation Age of Onset  . Lung cancer Mother 69  . Esophageal cancer Father 30  . Brain cancer Maternal Grandmother     ALLERGIES:  has No Known Allergies.  MEDICATIONS:  No current facility-administered medications for this visit.   No current outpatient medications on file.   Facility-Administered Medications Ordered in Other Visits  Medication Dose Route Frequency Provider Last Rate Last Admin  . 0.9 %  sodium chloride infusion   Intravenous Continuous Schnier, Dolores Lory, MD 75 mL/hr at 06/19/22 1540 Infusion Verify at 06/19/22 0621  . 0.9 %  sodium chloride infusion  250 mL Intravenous PRN Schnier, Dolores Lory, MD      . acetaminophen (TYLENOL) tablet 650 mg  650 mg Oral Q6H PRN Schnier, Dolores Lory, MD       Or  . acetaminophen (TYLENOL) suppository 650 mg  650 mg Rectal Q6H PRN Schnier, Dolores Lory, MD      . albuterol (PROVENTIL) (2.5 MG/3ML) 0.083% nebulizer solution 3 mL  3 mL Inhalation Q2H PRN Schnier, Dolores Lory, MD   3 mL at 06/18/22 1524  . azithromycin (ZITHROMAX) 500 mg in sodium chloride 0.9 % 250 mL IVPB  500 mg Intravenous Daily Delena Bali, [facility]   Stopped at 06/19/22 0024  . ceFEPIme (MAXIPIME) 2 g in sodium chloride 0.9 % 100 mL IVPB  2 g Intravenous Q8H Delena Bali, RPH 200 mL/hr at 06/19/22 0867 2 g at 06/19/22 6195  . Chlorhexidine Gluconate Cloth 2 % PADS 6 each  6 each Topical Daily Enzo Bi, MD      . heparin ADULT infusion 100 units/mL (25000 units/265m)  1,150 Units/hr Intravenous Continuous Cox, Amy N, DO 11.5 mL/hr at 06/19/22 0621 1,150 Units/hr at 06/19/22  0621  . heparin lock flush 100 UNIT/ML injection           . HYDROmorphone (DILAUDID) injection 0.5-1 mg  0.5-1 mg Intravenous Q3H PRN Schnier, GDolores Lory MD   1 mg at 06/19/22 00932 . ipratropium-albuterol (DUONEB) 0.5-2.5 (3) MG/3ML nebulizer solution 3 mL  3 mL Nebulization Q6H PRN Schnier, GDolores Lory MD      . nicotine (NICODERM CQ - dosed in mg/24 hours) patch 21 mg  21 mg Transdermal Daily PRN Schnier, GDolores Lory MD      . ondansetron ([facility] injection 4 mg  4 mg Intravenous Q6H PRN Schnier, GDolores Lory MD      . ondansetron ([facility] tablet 4 mg  4 mg Oral Q6H PRN Schnier, GDolores Lory MD      . oxyCODONE (Oxy IR/ROXICODONE) immediate release tablet 5-10 mg  5-10 mg Oral Q4H PRN Schnier, GDolores Lory MD   10 mg at 06/18/22 2141  . potassium chloride SA (KLOR-CON M) CR tablet 40 mEq  40 mEq Oral Once Schnier, GDolores Lory MD      . senna-docusate (Senokot-S) tablet 1 tablet  1 tablet Oral QHS PRN Schnier, GDolores Lory MD      . sodium chloride flush (NS) 0.9 % injection 3 mL  3 mL Intravenous Q12H Schnier, GDolores Lory MD   3 mL at 06/19/22 0823  . sodium chloride flush (NS) 0.9 % injection 3 mL  3 mL Intravenous PRN Schnier,  Dolores Lory, MD      . vancomycin Alcus Dad) IVPB 1750 mg/350 mL  1,750 mg Intravenous Q24H Delena Bali, [facility]   Stopped at 06/19/22 0240      .  PHYSICAL EXAMINATION: ECOG PERFORMANCE STATUS: 1 - Symptomatic but completely ambulatory  There were no vitals filed for this visit.   There were no vitals filed for this visit.    Physical Exam Vitals and nursing note reviewed.  HENT:     Head: Normocephalic and atraumatic.     Mouth/Throat:     Pharynx: Oropharynx is clear.  Eyes:     Extraocular Movements: Extraocular movements intact.     Pupils: Pupils are equal, round, and reactive to light.  Cardiovascular:     Rate and Rhythm: Normal rate and regular rhythm.  Pulmonary:     Comments: Decreased breath sounds bilaterally.  Abdominal:     Palpations:  Abdomen is soft.  Musculoskeletal:        General: Normal range of motion.     Cervical back: Normal range of motion.  Skin:    General: Skin is warm.  Neurological:     General: No focal deficit present.     Mental Status: She is alert and oriented to person, place, and time.  Psychiatric:        Behavior: Behavior normal.        Judgment: Judgment normal.     LABORATORY DATA:  I have reviewed the data as listed Lab Results  Component Value Date   WBC 6.1 06/19/2022   HGB 12.5 06/19/2022   HCT 39.5 06/19/2022   MCV 91.2 06/19/2022   PLT 204 06/19/2022   Recent Labs    01/14/22 0844 04/16/22 1445 06/18/22 1222 06/19/22 0135  NA 132* 136 136 138  K 3.8 3.4* 2.8* 3.9  CL 95* 99 90* 100  CO2 28 28 35* 33*  GLUCOSE 98 114* 115* 143*  BUN 9 7* <5* <5*  CREATININE 0.77 0.67 0.56 0.63  CALCIUM 8.7* 8.4* 8.6* 7.7*  GFRNONAA >60 >60 >60 >60  PROT 7.1 6.7 6.2*  --   ALBUMIN 3.7 3.3* 2.8*  --   AST _0 --   ALT _1 --   ALKPHOS 93 91 108  --   BILITOT 0.4 0.4 0.5  --   BILIDIR  --   --  0.2  --   IBILI  --   --  0.3  --      RADIOGRAPHIC STUDIES: I have personally reviewed the radiological images as listed and agreed with the findings in the report. DG ABD ACUTE 2+V W 1V CHEST  Result Date: 06/19/2022 CLINICAL DATA:  65 year old female with metastatic colorectal cancer. Small-bowel obstruction. EXAM: DG ABDOMEN ACUTE WITH 1 VIEW CHEST COMPARISON:  CT Chest, Abdomen, and Pelvis 06/18/2022. FINDINGS: Seated AP chest at 0708 hours. No pneumothorax or pneumoperitoneum. Numerous lung metastases. Superimposed confluent right upper lobe airspace disease. Left lower lobe mass and/or small pleural effusion. Stable right chest Port-A-Cath. Stable cardiac size and mediastinal contours. Visualized tracheal air column is within normal limits. Stable visualized osseous structures. Upright and supine views of the abdomen and pelvis at 0710 hours. Unchanged bowel gas pattern  from the CT scout view yesterday. Ongoing dilated and gas-filled small bowel loops with left lower quadrant ostomy, distal colectomy. Aortoiliac calcified atherosclerosis. Iliac artery vascular stents suspected. Stable visualized osseous structures. Calcified splenic granulomas. No pneumoperitoneum identified. IMPRESSION: 1. No improvement in  obstructed bowel gas pattern since the CT yesterday. No pneumoperitoneum. 2. Stable chest with bilateral metastases, right upper lobe airspace opacity which may be superimposed pneumonia. Electronically Signed   By: Tamara Ann M.D.   On: 06/19/2022 07:42   PERIPHERAL VASCULAR CATHETERIZATION  Result Date: 06/18/2022 See surgical note for result.  US Venous Img Lower Bilateral  Result Date: 06/18/2022 CLINICAL DATA:  Bilateral leg pain EXAM: BILATERAL LOWER EXTREMITY VENOUS DOPPLER ULTRASOUND TECHNIQUE: Gray-scale sonography with graded compression, as well as color Doppler and duplex ultrasound were performed to evaluate the lower extremity deep venous systems from the level of the common femoral vein and including the common femoral, femoral, profunda femoral, popliteal and calf veins including the posterior tibial, peroneal and gastrocnemius veins when visible. The superficial great saphenous vein was also interrogated. Spectral Doppler was utilized to evaluate flow at rest and with distal augmentation maneuvers in the common femoral, femoral and popliteal veins. COMPARISON:  None Available. FINDINGS: RIGHT LOWER EXTREMITY Common Femoral Vein: No evidence of thrombus. Normal compressibility, respiratory phasicity and response to augmentation. Saphenofemoral Junction: No evidence of thrombus. Normal compressibility and flow on color Doppler imaging. Profunda Femoral Vein: No evidence of thrombus. Normal compressibility and flow on color Doppler imaging. Femoral Vein: No evidence of thrombus. Normal compressibility, respiratory phasicity and response to augmentation.  Popliteal Vein: No evidence of thrombus. Normal compressibility, respiratory phasicity and response to augmentation. Calf Veins: No evidence of thrombus. Normal compressibility and flow on color Doppler imaging. Other: Optimal 5.5 x 1.8 x 4.2 cm fluid collection in the right popliteal fossa, compatible with a Baker's cyst. LEFT LOWER EXTREMITY Common Femoral Vein: No evidence of thrombus. Normal compressibility, respiratory phasicity and response to augmentation. Saphenofemoral Junction: No evidence of thrombus. Normal compressibility and flow on color Doppler imaging. Profunda Femoral Vein: No evidence of thrombus. Normal compressibility and flow on color Doppler imaging. Femoral Vein: No evidence of thrombus. Normal compressibility, respiratory phasicity and response to augmentation. Popliteal Vein: No evidence of thrombus. Normal compressibility, respiratory phasicity and response to augmentation. Calf Veins: No evidence of thrombus. Normal compressibility and flow on color Doppler imaging. IMPRESSION: 1. No evidence of deep venous thrombosis in either lower extremity. 2. Approximately 5.5 cm probable right Baker's cyst. Electronically Signed   By: Margaretha Sheffield M.D.   On: 06/18/2022 15:43   CT ABDOMEN PELVIS W CONTRAST  Result Date: 06/18/2022 CLINICAL DATA:  Abdominal pain, shortness of breath EXAM: CT ABDOMEN AND PELVIS WITH CONTRAST TECHNIQUE: Multidetector CT imaging of the abdomen and pelvis was performed using the standard protocol following bolus administration of intravenous contrast. RADIATION DOSE REDUCTION: This exam was performed according to the departmental dose-optimization program which includes automated exposure control, adjustment of the mA and/or kV according to patient size and/or use of iterative reconstruction technique. CONTRAST:  72m OMNIPAQUE IOHEXOL 350 MG/ML SOLN COMPARISON:  Previous studies including the examination of 11/02/2021 FINDINGS: Hepatobiliary: There is 2.9 cm  low-density lesion in liver with no significant change, possibly residual finding from previously treated neoplastic process. There is subcentimeter low-density in the right lobe with no significant change. There are multiple calcified granulomas. Pancreas: No focal abnormalities are seen. Spleen: There are calcified granulomas. Adrenals/Urinary Tract: Adrenals are unremarkable. There is no hydronephrosis. There are small calcifications in left renal hilum, possibly arterial calcifications. Ureters are not dilated. Urinary bladder is distended. Stomach/Bowel: Stomach is unremarkable. There is dilation of small-bowel loops measuring up to 4.1 cm in diameter. There is decompression of distal small bowel  loops. Level of obstruction is possibly in left mid abdomen. There is previous resection of sigmoid colon with colostomy left lower quadrant. There is herniation of portion of: In parastomal location. Vascular/Lymphatic: There is infrarenal abdominal aortic aneurysm measuring 4.5 x 4.4 cm. It measured 4.1 cm in diameter in the previous study. Part of the lumen is filled with thrombus. Severe atherosclerotic changes are noted in the proximal courses of both common iliac arteries. There is no retroperitoneal hematoma. Reproductive: Uterus is not seen. Presacral soft tissue thickening has not changed. Other: There is no ascites or pneumoperitoneum. Musculoskeletal: No significant interval changes are noted. IMPRESSION: There is abnormal dilation of proximal small bowel loops measuring up to 4.1 cm in diameter with decompression of distal small bowel loops suggesting partial small bowel obstruction. This may be due to neoplastic process or adhesions. There is no pneumoperitoneum. There is no hydronephrosis. There is interval increase in size of infrarenal abdominal aortic aneurysm. Aneurysm in the current study measures 4.5 cm. Aneurysm measured 4.1 cm in diameter in the study done on 04-Nov-2021. Other findings as  described in the body of the report. Electronically Signed   By: Elmer Picker M.D.   On: 06/18/2022 15:34   CT Angio Chest PE W and/or Wo Contrast  Result Date: 06/18/2022 CLINICAL DATA:  Shortness of breath EXAM: CT ANGIOGRAPHY CHEST WITH CONTRAST TECHNIQUE: Multidetector CT imaging of the chest was performed using the standard protocol during bolus administration of intravenous contrast. Multiplanar CT image reconstructions and MIPs were obtained to evaluate the vascular anatomy. RADIATION DOSE REDUCTION: This exam was performed according to the departmental dose-optimization program which includes automated exposure control, adjustment of the mA and/or kV according to patient size and/or use of iterative reconstruction technique. CONTRAST:  60m OMNIPAQUE IOHEXOL 350 MG/ML SOLN COMPARISON:  Previous studies including the CT done on 001/29/2023and chest radiographs done on 06/18/2022 FINDINGS: Cardiovascular: There is moderate pericardial effusion. Coronary artery calcifications are seen. There is homogeneous enhancement in thoracic aorta. There are no intraluminal filling defects in pulmonary artery branches. Mediastinum/Nodes: There are slightly enlarged lymph nodes in mediastinum with no significant interval change. There are calcified nodes in subcarinal region with no significant change. Lungs/Pleura: There is interval increase in size and number of numerous metastatic foci in both lungs There is new patchy ground-glass infiltrate in right upper lobe and right middle lobe There is a pleural-based mass in posterior left lower lung field with interval increase in size. The lesion measures approximately 7.3 cm in maximum diameter. There is a large lytic lesion in the posterior left eighth rib with interval progression. Upper Abdomen: There is an ill-defined 2.5 cm low-density in the liver in image 126 of series 2, possibly hepatic metastatic disease or residual change from previous therapy for  metastatic disease. There are multiple calcified granulomas in liver and spleen. Musculoskeletal: There is interval progression of metastatic disease in the posterior left eighth rib. No new focal lytic lesions are seen. Review of the MIP images confirms the above findings. IMPRESSION: There is no evidence of pulmonary artery embolism. There is no evidence of thoracic aortic dissection. There is a large lytic lesion in the posterior left eighth rib consistent with metastatic disease with interval progression. There is a large 7.3 cm soft tissue mass associated with the lytic lesion in left lower lobe with interval increase in size suggesting progression of metastatic disease. There is interval increase in size and number of multiple metastatic foci in both lungs suggesting interval  progression of pulmonary metastatic disease. Moderate to large pericardial effusion.  Coronary artery disease. Electronically Signed   By: Elmer Picker M.D.   On: 06/18/2022 15:17   DG Chest 2 View  Result Date: 06/18/2022 CLINICAL DATA:  History of colon cancer.  Shortness of breath. EXAM: CHEST - 2 VIEW COMPARISON:  June 06, 2021. FINDINGS: Stable cardiomediastinal silhouette. Right-sided Port-A-Cath is unchanged in position. Bilateral pulmonary nodules are noted consistent with metastatic disease. Left basilar atelectasis or infiltrate is noted with associated pleural effusion. Bony thorax is unremarkable. IMPRESSION: Bilateral pulmonary nodules are noted consistent with metastatic disease. Left basilar atelectasis or infiltrate is noted with associated pleural effusion. Electronically Signed   By: Marijo Conception M.D.   On: 06/18/2022 13:08    ASSESSMENT & PLAN:   No problem-specific Assessment & Plan notes found for this encounter.     All questions were answered. The patient knows to call the clinic with any problems, questions or concerns.   Cammie Sickle, MD 06/19/2022 8:39 AM

## 2022-06-19 NOTE — Consult Note (Signed)
ANTICOAGULATION CONSULT NOTE - Follow Up Consult  Pharmacy Consult for heparin infusion Indication:  ischemia  No Known Allergies  Patient Measurements: Height: 5\' 7"  (170.2 cm) Weight: 81.5 kg (179 lb 10.8 oz) IBW/kg (Calculated) : 61.6 Heparin Dosing Weight: 78.4 kg  Vital Signs: Temp: 97.6 F (36.4 C) (09/13 1220) Temp Source: Oral (09/13 1220) BP: 144/80 (09/13 1220) Pulse Rate: 100 (09/13 1220)  Labs: Recent Labs    06/18/22 1222 06/18/22 1223 06/18/22 1535 06/19/22 0135 06/19/22 0916 06/19/22 1558  HGB 13.8  --   --  12.5  --   --   HCT 43.5  --   --  39.5  --   --   PLT 256  --   --  204  --   --   APTT  --  32  --   --   --   --   LABPROT  --  13.9  --   --   --   --   INR  --  1.1  --   --   --   --   HEPARINUNFRC  --   --   --  0.23* 0.24* 0.41  CREATININE 0.56  --   --  0.63  --   --   TROPONINIHS 30*  --  41*  --   --   --      Estimated Creatinine Clearance: 76 mL/min (by C-G formula based on SCr of 0.63 mg/dL).   Assessment: Tamara Hall is a 66 y.o. female presenting with increased SOB. Patient also reporting L 5th toe pain with increased erythema, found to have poor capillary refill on exam. PMH significant for rectal cancer stage IV, TUD, COPD, GERD. Patient not on anticoagulation per chart review. Pharmacy has been consulted to initiate and manage heparin infusion.  Baseline Labs: aPTT 32, PT 13.9, INR 1.1, Hgb 13.8, Hct 43.5, Plt 256  Goal of Therapy:  Heparin level 0.3-0.7 units/ml Monitor platelets by anticoagulation protocol: Yes  Date Time HL Rate/Comment 9/13 0135 0.23 SUBtherapeutic 9/13 0916 0.24 SUBtherapeutic 9/13 1558 0.41 Therapeutic x1   Plan: Continue heparin infusion at 1300 units/hr Check HL in 6 hrs and daily while on heparin  Continue to monitor CBC daily while on heparin  Thank you for allowing pharmacy to be a part of this patient's care.   Gretel Acre, PharmD PGY1 Pharmacy Resident 06/19/2022 5:44  PM

## 2022-06-19 NOTE — Consult Note (Signed)
ANTICOAGULATION CONSULT NOTE - Follow Up Consult  Pharmacy Consult for heparin infusion Indication:  ischemia  No Known Allergies  Patient Measurements: Height: 5\' 7"  (170.2 cm) Weight: 81.5 kg (179 lb 10.8 oz) IBW/kg (Calculated) : 61.6 Heparin Dosing Weight: 78.4 kg  Vital Signs: Temp: 97.9 F (36.6 C) (09/13 1957) Temp Source: Oral (09/13 1957) BP: 147/74 (09/13 1957) Pulse Rate: 108 (09/13 1957)  Labs: Recent Labs    06/18/22 1222 06/18/22 1223 06/18/22 1535 06/19/22 0135 06/19/22 0135 06/19/22 0916 06/19/22 1558 06/19/22 2120  HGB 13.8  --   --  12.5  --   --   --   --   HCT 43.5  --   --  39.5  --   --   --   --   PLT 256  --   --  204  --   --   --   --   APTT  --  32  --   --   --   --   --   --   LABPROT  --  13.9  --   --   --   --   --   --   INR  --  1.1  --   --   --   --   --   --   HEPARINUNFRC  --   --   --  0.23*   < > 0.24* 0.41 0.39  CREATININE 0.56  --   --  0.63  --   --   --   --   TROPONINIHS 30*  --  41*  --   --   --   --   --    < > = values in this interval not displayed.     Estimated Creatinine Clearance: 76 mL/min (by C-G formula based on SCr of 0.63 mg/dL).   Assessment: Tamara Hall is a 67 y.o. female presenting with increased SOB. Patient also reporting L 5th toe pain with increased erythema, found to have poor capillary refill on exam. PMH significant for rectal cancer stage IV, TUD, COPD, GERD. Patient not on anticoagulation per chart review. Pharmacy has been consulted to initiate and manage heparin infusion.  Baseline Labs: aPTT 32, PT 13.9, INR 1.1, Hgb 13.8, Hct 43.5, Plt 256  Goal of Therapy:  Heparin level 0.3-0.7 units/ml Monitor platelets by anticoagulation protocol: Yes  Date Time HL Rate/Comment 9/13 0135 0.23 SUBtherapeutic 9/13 0916 0.24 SUBtherapeutic 9/13 1558 0.41 Therapeutic x1 9/13    2120    0.39    Therapeutic X 2    Plan: 9/13 @ 2120:  HL = 0.39, therapeutic X 2 Will continue pt on current rate  and recheck HL on 9/14 with AM labs.  Tamara Hall 06/19/2022 9:58 PM

## 2022-06-19 NOTE — Consult Note (Signed)
[place] [facility] [place] Telephone:([phone]) [phone] Fax:([phone]) [phone]   Name: Tamara Hall Date: 06/19/2022 MRN: [id number]  DOB: Mar 16, 1956  Patient Care Team: Loni Muse, MD as PCP - General (Internal Medicine) Stitzenberg, Clint Lipps, MD as Referring Physician (Surgical Oncology) Lequita Asal, MD (Inactive) as Referring Physician (Hematology and Oncology) Tyler Pita, MD as Consulting Physician (Pulmonary Disease) Cammie Sickle, MD as Medical Oncologist (Internal Medicine)    REASON FOR CONSULTATION: Tamara Hall is a 66 y.o. 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  Patient is status post RT but had previously declined chemotherapy.  CTs on 11/02/2021 revealed numerous new enlarged bilateral pulmonary nodules, increased bony destruction/chest wall metastasis and stable liver metastasis. Patient was referred to hospice but revoked services after two days due to anxiety.  She was admitted to the hospital on 06/18/2022 with multifocal pneumonia and left foot ischemia.  CTs revealed partial small bowel obstruction, interval increase in size and number of lytic and lung masses.patient underwent revascularization and stenting to bilateral iliac arteries.  Palliative care was consulted to address goals.   SOCIAL HISTORY:     reports that she has been smoking cigarettes. She has a 8.00 pack-year smoking history. She has never used smokeless tobacco. She reports that she does not drink alcohol and does not use drugs.  Widowed. She lives alone.   ADVANCE DIRECTIVES:  Does not have  CODE STATUS: DNR  PAST MEDICAL HISTORY: Past Medical History:  Diagnosis Date   Asthma    Cancer ([place])    colon, with mets to liver and left lung   Cough     Hypertension    Pneumonia    20+ years ago    PAST SURGICAL HISTORY:  Past Surgical History:  Procedure Laterality Date   ABDOMINAL HYSTERECTOMY     BREAST SURGERY     COLON SURGERY  10/2017   [facility]   COLONOSCOPY WITH PROPOFOL N/A 01/14/2017   Procedure: COLONOSCOPY WITH PROPOFOL;  Surgeon: Lucilla Lame, MD;  Location: [facility] ENDOSCOPY;  Service: Endoscopy;  Laterality: N/A;   ELBOW SURGERY Right    Has had 4 surgeries on right elbow.   HAND SURGERY Right 2008   IR CV LINE INJECTION  09/10/2021   JOINT REPLACEMENT     LIVER BIOPSY  10/2017   LOWER EXTREMITY ANGIOGRAPHY Left 06/18/2022   Procedure: Lower Extremity Angiography;  Surgeon: Katha Cabal, MD;  Location: [place] [facility];  Service: Cardiovascular;  Laterality: Left;   lung wedge resection  10/2017   PORTA CATH INSERTION N/A 01/29/2017   Procedure: Glori Luis Cath Insertion;  Surgeon: Algernon Huxley, MD;  Location: [place] [facility];  Service: Cardiovascular;  Laterality: N/A;   TONSILLECTOMY      HEMATOLOGY/ONCOLOGY HISTORY:  Oncology History Overview Note  She received 6 cycles of FOLFOX (02/18/2017 - 04/22/2017).               She underwent CT guided microwave ablation of the liver lesion on 06/06/2017.               She received radiation and daily Xeloda (06/18/2017- 08/15/2017).             She underwent APR with descending colostomy and VRAM flap on 10/24/2017.  She underwent a VATS wedge resection of her left lung.             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                         She declined left lower lobe lobectomy.                         She received 1 cycle of FOLFIRI (03/08/2020).                           She declined further chemotherapy or surgery.             She completed SBRT to the left infrahilar nodule on 05/22/2020.             CEA was 40.9 on 06/13/2020 and 9.5 on 10/31/2020.              Chest, abdomen, and pelvis CT on 10/31/2020  was personally reviewed.  Agree with radiology findings.   # SEP 2022-left posterior chest wall recurrent disease status post SBRT [finished OCT, 23rd, 2022] CT scan- JAN 2023 shows progressive disease in bilateral lungs; and also increasing expansile size of the left rib lesion; no progression of disease in the liver.   Rectal cancer ([place])  01/13/2017 Initial Diagnosis   Rectal cancer ([place])   03/08/2020 - 03/10/2020 Chemotherapy   Patient is on Treatment Plan : COLORECTAL FOLFIRI / BEVACIZUMAB Q14D     01/14/2022 -  Chemotherapy   Patient is on Treatment Plan : COLORECTAL FOLFOX + Bevacizumab q14d       ALLERGIES:  has No Known Allergies.  MEDICATIONS:  Current Facility-Administered Medications  Medication Dose Route Frequency Provider Last Rate Last Admin   0.9 %  sodium chloride infusion   Intravenous Continuous Schnier, Dolores Lory, MD 75 mL/hr at 06/19/22 0904 New Bag at 06/19/22 0904   0.9 %  sodium chloride infusion  250 mL Intravenous PRN Schnier, Dolores Lory, MD       acetaminophen (TYLENOL) tablet 650 mg  650 mg Oral Q6H PRN Schnier, Dolores Lory, MD       Or   acetaminophen (TYLENOL) suppository 650 mg  650 mg Rectal Q6H PRN Schnier, Dolores Lory, MD       albuterol (PROVENTIL) (2.5 MG/3ML) 0.083% nebulizer solution 3 mL  3 mL Inhalation Q2H PRN Schnier, Dolores Lory, MD   3 mL at 06/18/22 1524   ALPRAZolam (XANAX) tablet 0.5 mg  0.5 mg Oral TID PRN Enzo Bi, MD   0.5 mg at 06/19/22 1600   azithromycin (ZITHROMAX) 500 mg in sodium chloride 0.9 % 250 mL IVPB  500 mg Intravenous Daily Delena Bali, [facility]   Stopped at 06/19/22 0024   Chlorhexidine Gluconate Cloth 2 % PADS 6 each  6 each Topical Daily Enzo Bi, MD   6 each at 06/19/22 1054   gabapentin (NEURONTIN) capsule 100 mg  100 mg Oral QHS Kayna Suppa, Kirt Boys, NP       heparin ADULT infusion 100 units/mL (25000 units/256mL)  1,300 Units/hr Intravenous Continuous Delena Bali, RPH 13 mL/hr at 06/19/22 1559 1,300 Units/hr at  06/19/22 1559   HYDROmorphone (DILAUDID) tablet 8 mg  8 mg Oral Q4H PRN Enzo Bi, MD   8 mg at 06/19/22 1515   ipratropium-albuterol (DUONEB) 0.5-2.5 (3)  MG/3ML nebulizer solution 3 mL  3 mL Nebulization Q6H PRN Schnier, Dolores Lory, MD   3 mL at 06/19/22 1308   ipratropium-albuterol (DUONEB) 0.5-2.5 (3) MG/3ML nebulizer solution 3 mL  3 mL Nebulization QID Enzo Bi, MD       methylPREDNISolone sodium succinate (SOLU-MEDROL) 40 mg/mL injection 40 mg  40 mg Intravenous Daily Enzo Bi, MD   40 mg at 06/19/22 1410   morphine (PF) 4 MG/ML injection 4 mg  4 mg Intravenous Q4H PRN Enzo Bi, MD   4 mg at 06/19/22 1254   nicotine (NICODERM CQ - dosed in mg/24 hours) patch 21 mg  21 mg Transdermal Daily PRN Schnier, Dolores Lory, MD       ondansetron [facility]) injection 4 mg  4 mg Intravenous Q6H PRN Schnier, Dolores Lory, MD       ondansetron [facility]) tablet 4 mg  4 mg Oral Q6H PRN Schnier, Dolores Lory, MD       senna-docusate (Senokot-S) tablet 1 tablet  1 tablet Oral QHS PRN Schnier, Dolores Lory, MD       sodium chloride flush (NS) 0.9 % injection 3 mL  3 mL Intravenous Q12H Schnier, Dolores Lory, MD   3 mL at 06/19/22 0823   sodium chloride flush (NS) 0.9 % injection 3 mL  3 mL Intravenous PRN Schnier, Dolores Lory, MD       Facility-Administered Medications Ordered in Other Encounters  Medication Dose Route Frequency Provider Last Rate Last Admin   heparin lock flush 100 UNIT/ML injection             VITAL SIGNS: BP (!) 144/80 (BP Location: Left Arm)   Pulse 100   Temp 97.6 F (36.4 C) (Oral)   Resp (!) 24   Ht 5\' 7"  (1.702 m)   Wt 179 lb 10.8 oz (81.5 kg)   SpO2 93%   BMI 28.14 kg/m  Filed Weights   06/18/22 1500  Weight: 179 lb 10.8 oz (81.5 kg)    Estimated body mass index is 28.14 kg/m as calculated from the following:   Height as of this encounter: 5\' 7"  (1.702 m).   Weight as of this encounter: 179 lb 10.8 oz (81.5 kg).  LABS: CBC:    Component Value Date/Time   WBC 6.1 06/19/2022 0135    HGB 12.5 06/19/2022 0135   HCT 39.5 06/19/2022 0135   PLT 204 06/19/2022 0135   MCV 91.2 06/19/2022 0135   NEUTROABS 7.5 04/16/2022 1445   LYMPHSABS 1.0 04/16/2022 1445   MONOABS 0.6 04/16/2022 1445   EOSABS 0.1 04/16/2022 1445   BASOSABS 0.0 04/16/2022 1445   Comprehensive Metabolic Panel:    Component Value Date/Time   NA 138 06/19/2022 0135   K 3.9 06/19/2022 0135   CL 100 06/19/2022 0135   CO2 33 (H) 06/19/2022 0135   BUN <5 (L) 06/19/2022 0135   CREATININE 0.63 06/19/2022 0135   GLUCOSE 143 (H) 06/19/2022 0135   CALCIUM 7.7 (L) 06/19/2022 0135   AST 18 06/18/2022 1222   ALT 12 06/18/2022 1222   ALKPHOS 108 06/18/2022 1222   BILITOT 0.5 06/18/2022 1222   PROT 6.2 (L) 06/18/2022 1222   ALBUMIN 2.8 (L) 06/18/2022 1222    RADIOGRAPHIC STUDIES: DG ABD ACUTE 2+V W 1V CHEST  Result Date: 06/19/2022 CLINICAL DATA:  66 year old female with metastatic colorectal cancer. Small-bowel obstruction. EXAM: DG ABDOMEN ACUTE WITH 1 VIEW CHEST COMPARISON:  CT Chest, Abdomen, and Pelvis 06/18/2022. FINDINGS: Seated AP chest at [facility]  hours. No pneumothorax or pneumoperitoneum. Numerous lung metastases. Superimposed confluent right upper lobe airspace disease. Left lower lobe mass and/or small pleural effusion. Stable right chest Port-A-Cath. Stable cardiac size and mediastinal contours. Visualized tracheal air column is within normal limits. Stable visualized osseous structures. Upright and supine views of the abdomen and pelvis at 0710 hours. Unchanged bowel gas pattern from the CT scout view yesterday. Ongoing dilated and gas-filled small bowel loops with left lower quadrant ostomy, distal colectomy. Aortoiliac calcified atherosclerosis. Iliac artery vascular stents suspected. Stable visualized osseous structures. Calcified splenic granulomas. No pneumoperitoneum identified. IMPRESSION: 1. No improvement in obstructed bowel gas pattern since the CT yesterday. No pneumoperitoneum. 2. Stable chest  with bilateral metastases, right upper lobe airspace opacity which may be superimposed pneumonia. Electronically Signed   By: Genevie Ann M.D.   On: 06/19/2022 07:42   PERIPHERAL VASCULAR CATHETERIZATION  Result Date: 06/18/2022 See surgical note for result.  US Venous Img Lower Bilateral  Result Date: 06/18/2022 CLINICAL DATA:  Bilateral leg pain EXAM: BILATERAL LOWER EXTREMITY VENOUS DOPPLER ULTRASOUND TECHNIQUE: Gray-scale sonography with graded compression, as well as color Doppler and duplex ultrasound were performed to evaluate the lower extremity deep venous systems from the level of the common femoral vein and including the common femoral, femoral, profunda femoral, popliteal and calf veins including the posterior tibial, peroneal and gastrocnemius veins when visible. The superficial great saphenous vein was also interrogated. Spectral Doppler was utilized to evaluate flow at rest and with distal augmentation maneuvers in the common femoral, femoral and popliteal veins. COMPARISON:  None Available. FINDINGS: RIGHT LOWER EXTREMITY Common Femoral Vein: No evidence of thrombus. Normal compressibility, respiratory phasicity and response to augmentation. Saphenofemoral Junction: No evidence of thrombus. Normal compressibility and flow on color Doppler imaging. Profunda Femoral Vein: No evidence of thrombus. Normal compressibility and flow on color Doppler imaging. Femoral Vein: No evidence of thrombus. Normal compressibility, respiratory phasicity and response to augmentation. Popliteal Vein: No evidence of thrombus. Normal compressibility, respiratory phasicity and response to augmentation. Calf Veins: No evidence of thrombus. Normal compressibility and flow on color Doppler imaging. Other: Optimal 5.5 x 1.8 x 4.2 cm fluid collection in the right popliteal fossa, compatible with a Baker's cyst. LEFT LOWER EXTREMITY Common Femoral Vein: No evidence of thrombus. Normal compressibility, respiratory phasicity  and response to augmentation. Saphenofemoral Junction: No evidence of thrombus. Normal compressibility and flow on color Doppler imaging. Profunda Femoral Vein: No evidence of thrombus. Normal compressibility and flow on color Doppler imaging. Femoral Vein: No evidence of thrombus. Normal compressibility, respiratory phasicity and response to augmentation. Popliteal Vein: No evidence of thrombus. Normal compressibility, respiratory phasicity and response to augmentation. Calf Veins: No evidence of thrombus. Normal compressibility and flow on color Doppler imaging. IMPRESSION: 1. No evidence of deep venous thrombosis in either lower extremity. 2. Approximately 5.5 cm probable right Baker's cyst. Electronically Signed   By: Margaretha Sheffield M.D.   On: 06/18/2022 15:43   CT ABDOMEN PELVIS W CONTRAST  Result Date: 06/18/2022 CLINICAL DATA:  Abdominal pain, shortness of breath EXAM: CT ABDOMEN AND PELVIS WITH CONTRAST TECHNIQUE: Multidetector CT imaging of the abdomen and pelvis was performed using the standard protocol following bolus administration of intravenous contrast. RADIATION DOSE REDUCTION: This exam was performed according to the departmental dose-optimization program which includes automated exposure control, adjustment of the mA and/or kV according to patient size and/or use of iterative reconstruction technique. CONTRAST:  74mL OMNIPAQUE IOHEXOL 350 MG/ML SOLN COMPARISON:  Previous studies including the examination  of 11-14-21 FINDINGS: Hepatobiliary: There is 2.9 cm low-density lesion in liver with no significant change, possibly residual finding from previously treated neoplastic process. There is subcentimeter low-density in the right lobe with no significant change. There are multiple calcified granulomas. Pancreas: No focal abnormalities are seen. Spleen: There are calcified granulomas. Adrenals/Urinary Tract: Adrenals are unremarkable. There is no hydronephrosis. There are small calcifications  in left renal hilum, possibly arterial calcifications. Ureters are not dilated. Urinary bladder is distended. Stomach/Bowel: Stomach is unremarkable. There is dilation of small-bowel loops measuring up to 4.1 cm in diameter. There is decompression of distal small bowel loops. Level of obstruction is possibly in left mid abdomen. There is previous resection of sigmoid colon with colostomy left lower quadrant. There is herniation of portion of: In parastomal location. Vascular/Lymphatic: There is infrarenal abdominal aortic aneurysm measuring 4.5 x 4.4 cm. It measured 4.1 cm in diameter in the previous study. Part of the lumen is filled with thrombus. Severe atherosclerotic changes are noted in the proximal courses of both common iliac arteries. There is no retroperitoneal hematoma. Reproductive: Uterus is not seen. Presacral soft tissue thickening has not changed. Other: There is no ascites or pneumoperitoneum. Musculoskeletal: No significant interval changes are noted. IMPRESSION: There is abnormal dilation of proximal small bowel loops measuring up to 4.1 cm in diameter with decompression of distal small bowel loops suggesting partial small bowel obstruction. This may be due to neoplastic process or adhesions. There is no pneumoperitoneum. There is no hydronephrosis. There is interval increase in size of infrarenal abdominal aortic aneurysm. Aneurysm in the current study measures 4.5 cm. Aneurysm measured 4.1 cm in diameter in the study done on 2021-11-14. Other findings as described in the body of the report. Electronically Signed   By: Elmer Picker M.D.   On: 06/18/2022 15:34   CT Angio Chest PE W and/or Wo Contrast  Result Date: 06/18/2022 CLINICAL DATA:  Shortness of breath EXAM: CT ANGIOGRAPHY CHEST WITH CONTRAST TECHNIQUE: Multidetector CT imaging of the chest was performed using the standard protocol during bolus administration of intravenous contrast. Multiplanar CT image reconstructions and MIPs  were obtained to evaluate the vascular anatomy. RADIATION DOSE REDUCTION: This exam was performed according to the departmental dose-optimization program which includes automated exposure control, adjustment of the mA and/or kV according to patient size and/or use of iterative reconstruction technique. CONTRAST:  75mL OMNIPAQUE IOHEXOL 350 MG/ML SOLN COMPARISON:  Previous studies including the CT done on 2021/11/14 and chest radiographs done on 06/18/2022 FINDINGS: Cardiovascular: There is moderate pericardial effusion. Coronary artery calcifications are seen. There is homogeneous enhancement in thoracic aorta. There are no intraluminal filling defects in pulmonary artery branches. Mediastinum/Nodes: There are slightly enlarged lymph nodes in mediastinum with no significant interval change. There are calcified nodes in subcarinal region with no significant change. Lungs/Pleura: There is interval increase in size and number of numerous metastatic foci in both lungs There is new patchy ground-glass infiltrate in right upper lobe and right middle lobe There is a pleural-based mass in posterior left lower lung field with interval increase in size. The lesion measures approximately 7.3 cm in maximum diameter. There is a large lytic lesion in the posterior left eighth rib with interval progression. Upper Abdomen: There is an ill-defined 2.5 cm low-density in the liver in image 126 of series 2, possibly hepatic metastatic disease or residual change from previous therapy for metastatic disease. There are multiple calcified granulomas in liver and spleen. Musculoskeletal: There is interval progression of metastatic  disease in the posterior left eighth rib. No new focal lytic lesions are seen. Review of the MIP images confirms the above findings. IMPRESSION: There is no evidence of pulmonary artery embolism. There is no evidence of thoracic aortic dissection. There is a large lytic lesion in the posterior left eighth rib  consistent with metastatic disease with interval progression. There is a large 7.3 cm soft tissue mass associated with the lytic lesion in left lower lobe with interval increase in size suggesting progression of metastatic disease. There is interval increase in size and number of multiple metastatic foci in both lungs suggesting interval progression of pulmonary metastatic disease. Moderate to large pericardial effusion.  Coronary artery disease. Electronically Signed   By: Elmer Picker M.D.   On: 06/18/2022 15:17   DG Chest 2 View  Result Date: 06/18/2022 CLINICAL DATA:  History of colon cancer.  Shortness of breath. EXAM: CHEST - 2 VIEW COMPARISON:  June 06, 2021. FINDINGS: Stable cardiomediastinal silhouette. Right-sided Port-A-Cath is unchanged in position. Bilateral pulmonary nodules are noted consistent with metastatic disease. Left basilar atelectasis or infiltrate is noted with associated pleural effusion. Bony thorax is unremarkable. IMPRESSION: Bilateral pulmonary nodules are noted consistent with metastatic disease. Left basilar atelectasis or infiltrate is noted with associated pleural effusion. Electronically Signed   By: Marijo Conception M.D.   On: 06/18/2022 13:08    PERFORMANCE STATUS (ECOG) : 3 - Symptomatic, >50% confined to bed  Review of Systems Unless otherwise noted, a complete review of systems is negative.  Physical Exam General: NAD Cardiovascular: regular rate and rhythm Pulmonary: clear ant fields Abdomen: soft, nontender, + bowel sounds GU: no suprapubic tenderness Extremities: no edema, no joint deformities Skin: no rashes Neurological: Weakness but otherwise nonfocal  IMPRESSION: Patient recognizes that her cancer is progressing and that she is likely nearing end-of-life.  She states that her original prognosis would only have her living a couple more months at best.  She agrees with focusing on best supportive/comfort measures and trying to maximize her  quality of life.  She is also in agreement with hospice involvement.  However, she has a strong desire not to utilize an inpatient hospice facility.  Her husband died at a hospice facility and she perceives their care as having hastened his demise.    Patient would be in agreement with hospice following her at home.  Previously, patient has lived at home alone but she recognizes that she is likely unable to do so going forward.  She is in process of speaking with her sister to see if she would either move in with her sister or have her sister move in with her.  At either location, patient would benefit from hospice involvement.  Symptomatically, patient continues to endorse severe anxiety, which is exacerbating both her pain and dyspnea.  She has been started today on alprazolam but has yet to receive a dose.  If this is ineffective, I would recommend considering scheduled IV or p.o. lorazepam.    Patient states that her pain is improved on hydromorphone.  However, she continues to endorse severe pain in her feet stemming from her previous ischemia.  Will start her on gabapentin at bedtime to see if this helps improve pain, insomnia, and anxiety.  Patient has concern that her ostomy bag needs changed but that the staff on the floor or unable to assist.  Spoke with staff RN.  Can consider WOC consult if needed.  I spoke with the hospice liaison, Lorayne Bender, who  agreed to see patient tomorrow and help coordinate further care.  PLAN: -Continue current scope of treatment -Referral for hospice services -Continue hydromorphone as needed for pain -Start gabapentin nightly.  Consider increasing to twice daily dosing tomorrow if tolerated. -Agree with as needed alprazolam -DNR  Case discussed with Dr. Rogue Bussing, staff RN   Time Total: 60 minutes  Visit consisted of counseling and education dealing with the complex and emotionally intense issues of symptom management and palliative care in the setting of  serious and potentially life-threatening illness.Greater than 50%  of this time was spent counseling and coordinating care related to the above assessment and plan.  Signed by: Altha Harm, PhD, NP-C

## 2022-06-19 NOTE — Progress Notes (Signed)
PROGRESS NOTE    DEBROH Tamara Hall  FMB:[id number] DOB: 1955-12-28 DOA: 06/18/2022 PCP: Loni Muse, MD  243A/[id number]-AA  LOS: 1 day   Brief hospital course: 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  She reports the pain is 10 out of 10.  She endorses cough.  Respiration rate was 19, heart rate of what was 107, blood pressure 140/65, SPO2 of 91% on 2 L nasal cannula.  Serum sodium is 136, potassium 2.8, chloride 90, bicarb 35, BUN of 25, serum creatinine of 0.56, GFR greater than 60, nonfasting blood glucose 115, WBC 10, hemoglobin 13.8, platelets of 256.  BNP is 273.  High sensitive troponin was 30.  ED treatment: Dilaudid x2, DuoNeb's x2, Solu-Medrol 125 mg IV one-time dose, ondansetron 4 mg IV, potassium chloride 40 mill equivalent, sodium chloride 1 L bolus.  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  Acute on chronic hypoxemic respiratory failure Baseline 2L home O2 --intermittent desat down to mid 80's while on 2-3L [place] --CT chest showed interval increase in size and number of multiple metastatic foci in both lungs.  Also has significant wheezing and rhonchi, suggesting COPD exacerbation --Continue supplemental O2 to keep sats between 88-92%, wean as tolerated  Acute COPD exacerbation Stage 3 severe COPD by GOLD classification ([place]) --significant wheezing and rhonchi --s/p solumedrol 125 mg in the ED Plan: --cont IV solumedrol 40 mg daily --DuoNeb QID --cont azithromycin  Acute pain of left foot S/p LE angiography --started on heparin gtt --cont heparin gtt    Partial small bowel obstruction (HCC) - Currently asymptomatic - GenSurg consulted, no intervention needed currently   Hypokalemia --monitor and replete PRN   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 Rogue Bussing --palliative consult   Tobacco use - As needed Nicotine patch for craving ordered   Multifocal pneumonia, ruled out --imaging studies showed extensive mets.  No fever, no leukocytosis, procal neg x2 --d/c vanc and cefepime today   DVT prophylaxis: [id number] gtt Code Status: DNR  Family Communication:  Level of care: Progressive Dispo:   The patient is from: home Anticipated d/c is to: home Anticipated d/c date is: 2-3 days   Subjective and Interval History:  Pt was coughing up blood-tinged sputum.  Pt reported episode of anxiety and severe dyspnea, however, currently declined benzo.  Reported neb tx helps.   Objective: Vitals:   06/19/22 0328 06/19/22 0746 06/19/22 1220 06/19/22 1310  BP: 139/67 137/68 (!) 144/80   Pulse: (!) 105 (!) 109 100   Resp: 18 18 (!) 24   Temp: 98.5 F (36.9 C) 97.8 F (36.6 C) 97.6 F (36.4 C)   TempSrc: Oral  Oral   SpO2: 97% 94% 91% 93%  Weight:      Height:        Intake/Output Summary (Last 24 hours) at 06/19/2022 1914 Last data filed at 06/19/2022 1818 Gross per 24 hour  Intake 1917.19 ml  Output 3930 ml  Net -2012.81 ml   Filed Weights   06/18/22 1500  Weight: 81.5 kg    Examination:   Constitutional: NAD,  AAOx3 HEENT: conjunctivae and lids normal, EOMI CV: No cyanosis.   RESP: normal respiratory effort, diffuse wheezing and rhonchi, on 3L Neuro: II - XII grossly intact.   Psych: depressed mood and affect.  Appropriate judgement and reason   Data Reviewed: I have personally reviewed labs and imaging studies  Time spent: 50 minutes  Enzo Bi, MD [facility] If 7PM-7AM, please contact night-coverage 06/19/2022, 7:14 PM

## 2022-06-19 NOTE — Progress Notes (Signed)
Manufacturing [employer] [facility]) Hospital Liaison Note   Received request from PMT/Joshua B. NP to speak with patient on 9.14 regarding hospice servies. MSW voiced understanding.  MSW to F/U with patient & family on 9.14.   Please call with any questions/concerns.    Thank you for the opportunity to participate in this patient's care.   Daphene Calamity, MSW [facility] Liaison  [phone]

## 2022-06-19 NOTE — Consult Note (Signed)
ANTICOAGULATION CONSULT NOTE - Follow Up Consult  Pharmacy Consult for heparin infusion Indication:  ischemia  No Known Allergies  Patient Measurements: Height: 5\' 7"  (170.2 cm) Weight: 81.5 kg (179 lb 10.8 oz) IBW/kg (Calculated) : 61.6 Heparin Dosing Weight: 78.4 kg  Vital Signs: Temp: 98.2 F (36.8 C) (09/12 2323) Temp Source: Oral (09/12 2323) BP: 132/65 (09/12 2323) Pulse Rate: 100 (09/12 2323)  Labs: Recent Labs    06/18/22 1222 06/18/22 1223 06/18/22 1535 06/19/22 0135  HGB 13.8  --   --  12.5  HCT 43.5  --   --  39.5  PLT 256  --   --  204  APTT  --  32  --   --   LABPROT  --  13.9  --   --   INR  --  1.1  --   --   HEPARINUNFRC  --   --   --  0.23*  CREATININE 0.56  --   --  0.63  TROPONINIHS 30*  --  41*  --      Estimated Creatinine Clearance: 76 mL/min (by C-G formula based on SCr of 0.63 mg/dL).   Medications:  Scheduled:   heparin  1,150 Units Intravenous Once   potassium chloride  40 mEq Oral Once   sodium chloride flush  3 mL Intravenous Q12H   Infusions:   sodium chloride 100 mL/hr at 06/19/22 0100   sodium chloride     sodium chloride     azithromycin Stopped (06/19/22 0024)   ceFEPime (MAXIPIME) IV Stopped (06/18/22 2308)   heparin 1,000 Units/hr (06/18/22 1920)   vancomycin 1,750 mg (06/19/22 0035)   PRN: sodium chloride, acetaminophen **OR** acetaminophen, albuterol, HYDROmorphone (DILAUDID) injection, ipratropium-albuterol, nicotine, ondansetron (ZOFRAN) IV, ondansetron **OR** [DISCONTINUED] ondansetron (ZOFRAN) IV, oxyCODONE, senna-docusate, sodium chloride flush  Assessment: Tamara Hall is a 66 y.o. female presenting with increased SOB. Patient also reporting L 5th toe pain with increased erythema, found to have poor capillary refill on exam. PMH significant for rectal cancer stage IV, TUD, COPD, GERD. Patient not on anticoagulation per chart review. Pharmacy has been consulted to initiate and manage heparin infusion.   Baseline  Labs: aPTT 32, PT 13.9, INR 1.1, Hgb 13.8, Hct 43.5, Plt 256  Goal of Therapy:  Heparin level 0.3-0.7 units/ml Monitor platelets by anticoagulation protocol: Yes   Plan:  9/13: HL @ 0135 = 0.23, SUBtherapeutic  Will order heparin 1150 units IV X 1 bolus and increase drip rate to 1150 units/hr.  Will recheck HL 6 hrs after rate change.   Darric Plante D 06/19/2022 2:17 AM

## 2022-06-20 DIAGNOSIS — I998 Other disorder of circulatory system: Secondary | ICD-10-CM | POA: Diagnosis not present

## 2022-06-20 DIAGNOSIS — J189 Pneumonia, unspecified organism: Secondary | ICD-10-CM | POA: Diagnosis not present

## 2022-06-20 LAB — CBC
HCT: 37.4 % (ref 36.0–46.0)
Hemoglobin: 12 g/dL (ref 12.0–15.0)
MCH: 29 pg (ref 26.0–34.0)
MCHC: 32.1 g/dL (ref 30.0–36.0)
MCV: 90.3 fL (ref 80.0–100.0)
Platelets: 217 10*3/uL (ref 150–400)
RBC: 4.14 MIL/uL (ref 3.87–5.11)
RDW: 14.5 % (ref 11.5–15.5)
WBC: 10.4 10*3/uL (ref 4.0–10.5)
nRBC: 0 % (ref 0.0–0.2)

## 2022-06-20 LAB — MAGNESIUM: Magnesium: 1.9 mg/dL (ref 1.7–2.4)

## 2022-06-20 LAB — BASIC METABOLIC PANEL
Anion gap: 8 (ref 5–15)
BUN: 7 mg/dL — ABNORMAL LOW (ref 8–23)
CO2: 31 mmol/L (ref 22–32)
Calcium: 8.1 mg/dL — ABNORMAL LOW (ref 8.9–10.3)
Chloride: 99 mmol/L (ref 98–111)
Creatinine, Ser: 0.42 mg/dL — ABNORMAL LOW (ref 0.44–1.00)
GFR, Estimated: >60 mL/min (ref >60)
Glucose, Bld: 91 mg/dL (ref 70–99)
Potassium: 3.8 mmol/L (ref 3.5–5.1)
Sodium: 138 mmol/L (ref 135–145)

## 2022-06-20 LAB — HEPARIN LEVEL (UNFRACTIONATED): Heparin Unfractionated: 0.36 IU/mL (ref 0.30–0.70)

## 2022-06-20 MED ORDER — APIXABAN 5 MG PO TABS
10.0000 mg | ORAL_TABLET | Freq: Two times a day (BID) | ORAL | Status: DC
  Administered 2022-06-20 – 2022-06-21 (×2): 10 mg via ORAL
  Filled 2022-06-20 (×2): qty 2

## 2022-06-20 MED ORDER — APIXABAN 5 MG PO TABS: 5.0000 mg | ORAL_TABLET | Freq: Two times a day (BID) | ORAL | Status: DC

## 2022-06-20 MED ORDER — CLOPIDOGREL BISULFATE 75 MG PO TABS
75.0000 mg | ORAL_TABLET | Freq: Every day | ORAL | Status: DC
  Administered 2022-06-20 – 2022-06-21 (×2): 75 mg via ORAL
  Filled 2022-06-20 (×2): qty 1

## 2022-06-20 NOTE — Progress Notes (Signed)
[facility]  Daily Progress Note   Subjective  -   Patient continues to have pain in toes however color has improved and foot.  Objective Vitals:   06/19/22 2103 06/20/22 0005 06/20/22 0345 06/20/22 0647  BP:  116/72 130/68 (!) 143/71  Pulse:  96 95   Resp:  18 19   Temp:  97.7 F (36.5 C) (!) 97.5 F (36.4 C)   TempSrc:  Oral Oral   SpO2: 97% 100% 96%   Weight:      Height:        Intake/Output Summary (Last 24 hours) at 06/20/2022 1000 Last data filed at 06/20/2022 0700 Gross per 24 hour  Intake 406 ml  Output 2680 ml  Net -2274 ml    PULM  CTAB CV  RRR VASC  2+ pedal pulses bilaterally  Laboratory CBC    Component Value Date/Time   WBC 10.4 06/20/2022 0436   HGB 12.0 06/20/2022 0436   HCT 37.4 06/20/2022 0436   PLT 217 06/20/2022 0436    BMET    Component Value Date/Time   NA 138 06/20/2022 0436   K 3.8 06/20/2022 0436   CL 99 06/20/2022 0436   CO2 31 06/20/2022 0436   GLUCOSE 91 06/20/2022 0436   BUN 7 (L) 06/20/2022 0436   CREATININE 0.42 (L) 06/20/2022 0436   CALCIUM 8.1 (L) 06/20/2022 0436   GFRNONAA >60 06/20/2022 0436   GFRAA >60 07/04/2020 1211    Assessment/Planning: POD #2 s/p bilateral iliac stents   Patient may transition from heparin to Eliquis.  Recommend starting with loading dose of Eliquis.  Also recommend the patient be placed on Plavix as well, without aspirin. Coloration of left lower extremity is improved, no further recommendation indicated. Recommend follow-up in office in 3 to 4 weeks  Kris Hartmann  06/20/2022, 10:00 AM

## 2022-06-20 NOTE — Progress Notes (Signed)
PROGRESS NOTE    Tamara Hall  QMG:[id number] DOB: 08-09-1956 DOA: 06/18/2022 PCP: Loni Muse, MD  146A/[id number]-AA  LOS: 2 days   Brief hospital course: 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  She reports the pain is 10 out of 10.  She endorses cough.  Respiration rate was 19, heart rate of what was 107, blood pressure 140/65, SPO2 of 91% on 2 L nasal cannula.  Serum sodium is 136, potassium 2.8, chloride 90, bicarb 35, BUN of 25, serum creatinine of 0.56, GFR greater than 60, nonfasting blood glucose 115, WBC 10, hemoglobin 13.8, platelets of 256.  BNP is 273.  High sensitive troponin was 30.  ED treatment: Dilaudid x2, DuoNeb's x2, Solu-Medrol 125 mg IV one-time dose, ondansetron 4 mg IV, potassium chloride 40 mill equivalent, sodium chloride 1 L bolus.  Assessment & Plan: 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  Comfort care status --After discussion with hospice liaison today, decision made to return home with hospice.  --control of pain, anxiety and respiratory distress  Acute on chronic hypoxemic respiratory failure Baseline 2L home O2 --intermittent desat down to mid 80's while on 2-3L  --CT chest showed interval increase in size and number of multiple metastatic foci in both lungs.  Also has significant wheezing and rhonchi, suggesting COPD exacerbation --Continue supplemental O2 to keep sats between 88-92%, wean as tolerated  Acute COPD exacerbation Stage 3 severe COPD by GOLD classification (HCC) --significant wheezing and rhonchi --s/p solumedrol 125 mg in the  ED Plan: --cont IV solumedrol 40 mg daily --DuoNeb QID --cont azithromycin --Xanax PRN  Acute pain of left foot S/p bilateral iliac stents  --started on heparin gtt --still has significant pain Plan: --transition to Eliquis today, per vascular surgery --start plavix, per vascular surgery --pain control   Partial small bowel obstruction (HCC) - Currently asymptomatic - GenSurg consulted, no intervention needed currently   Hypokalemia --monitor and replete PRN   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 Rogue Bussing --palliative consulted --plan to go home with hospice --pain control   Tobacco use - As needed Nicotine patch for craving ordered   Multifocal pneumonia, ruled out --imaging studies showed extensive mets.  No fever, no leukocytosis, procal neg x2 --d/c'ed vanc and cefepime    DVT prophylaxis: TO:IZTIWPY Code Status: DNR  Family Communication: sister updated at bedside today Level of care: Med-Surg Dispo:   The patient is from: home Anticipated d/c is to: home with hospice Anticipated d/c date is: tomorrow   Subjective and Interval History:  Pt understandably has severe anxiety.  After discussion with hospice liaison, decision made to return home with hospice.    Objective: Vitals:   06/20/22 0647 06/20/22 1107 06/20/22 1200 06/20/22 1528  BP: (!) 143/71  (!) 104/54 106/72  Pulse:   (!) 101 99  Resp:    20  Temp:   98.1 F (36.7 C) 98.2 F (36.8 C)  TempSrc:   Oral Oral  SpO2:  96% 94% 96%  Weight:      Height:        Intake/Output Summary (  Last 24 hours) at 06/20/2022 1903 Last data filed at 06/20/2022 1800 Hall per 24 hour  Intake 482.65 ml  Output 650 ml  Net -167.35 ml   Filed Weights   06/18/22 1500  Weight: 81.5 kg    Examination:   Constitutional: NAD, AAOx3, sitting at edge of bed HEENT: conjunctivae and lids normal, EOMI CV: No cyanosis.   RESP: normal respiratory effort, on  3L Neuro: II - XII grossly intact.   Psych: anxious mood and affect.  Appropriate judgement and reason   Data Reviewed: I have personally reviewed labs and imaging studies  Time spent: 50 minutes  Enzo Bi, MD [facility] If 7PM-7AM, please contact night-coverage 06/20/2022, 7:03 PM

## 2022-06-20 NOTE — Progress Notes (Signed)
[facility]) Hospital Liaison Note   Received request from PMT NP, Lennox Solders., for hospice services at home after discharge. Chart and patient information under review by [facility] physician. Hospice eligibility approved.   Spoke with patient & sister/Dale to initiate education related to hospice philosophy, services, and team approach to care. Both verbalized understanding of information given. Per discussion, the plan is for patient to discharge home via TBD once cleared to DC.    DME needs discussed. Patient has the following equipment in the home (Purchased privately): tbd Patient requests the following equipment for delivery: [facility]  Address verified and is correct in the chart. Quita Skye is the family member to contact to arrange time of equipment delivery.    Please send signed and completed DNR home with patient/family. Please provide prescriptions at discharge as needed to ensure ongoing symptom management.    [facility] information and contact numbers given to family & above information shared with TOC.   Please call with any questions/concerns.    Thank you for the opportunity to participate in this patient's care.   Daphene Calamity, MSW [facility] Liaison  [phone]

## 2022-06-20 NOTE — Consult Note (Signed)
ANTICOAGULATION CONSULT NOTE - Follow Up Consult  Pharmacy Consult for heparin infusion Indication:  ischemia  No Known Allergies  Patient Measurements: Height: 5\' 7"  (170.2 cm) Weight: 81.5 kg (179 lb 10.8 oz) IBW/kg (Calculated) : 61.6 Heparin Dosing Weight: 78.4 kg  Vital Signs: Temp: 98.1 F (36.7 C) (09/14 1200) Temp Source: Oral (09/14 1200) BP: 104/54 (09/14 1200) Pulse Rate: 101 (09/14 1200)  Labs: Recent Labs    06/18/22 1222 06/18/22 1223 06/18/22 1535 06/19/22 0135 06/19/22 0916 06/19/22 1558 06/19/22 2120 06/20/22 0436  HGB 13.8  --   --  12.5  --   --   --  12.0  HCT 43.5  --   --  39.5  --   --   --  37.4  PLT 256  --   --  204  --   --   --  217  APTT  --  32  --   --   --   --   --   --   LABPROT  --  13.9  --   --   --   --   --   --   INR  --  1.1  --   --   --   --   --   --   HEPARINUNFRC  --   --   --  0.23*   < > 0.41 0.39 0.36  CREATININE 0.56  --   --  0.63  --   --   --  0.42*  TROPONINIHS 30*  --  41*  --   --   --   --   --    < > = values in this interval not displayed.     Estimated Creatinine Clearance: 76 mL/min (A) (by C-G formula based on SCr of 0.42 mg/dL (L)).   Assessment: Tamara Hall is a 66 y.o. female presenting with increased SOB. Patient also reporting L 5th toe pain with increased erythema, found to have poor capillary refill on exam. PMH significant for rectal cancer stage IV, TUD, COPD, GERD. Patient not on anticoagulation per chart review. Pharmacy has been consulted to initiate and manage heparin infusion.  Baseline Labs: aPTT 32, PT 13.9, INR 1.1, Hgb 13.8, Hct 43.5, Plt 256  Goal of Therapy:  Heparin level 0.3-0.7 units/ml Monitor platelets by anticoagulation protocol: Yes  Date Time HL Rate/Comment 9/13 0135 0.23 SUBtherapeutic 9/13 0916 0.24 SUBtherapeutic 9/13 1558 0.41 Therapeutic x1 9/13    2120    0.39 Therapeutic x2  9/14    0436    0.36 Therapeutic x3    Plan: Discontinue heparin infusion and  switch to apixaban 10 mg BID x7 days then 5 mg BID thereafter (starting 9/21 PM dose) Pharmacy will sign off  Thank you for allowing pharmacy to be a part of this patient's care.  Gretel Acre, PharmD PGY1 Pharmacy Resident 06/20/2022 5:06 PM

## 2022-06-20 NOTE — Plan of Care (Signed)

## 2022-06-20 NOTE — Consult Note (Signed)
ANTICOAGULATION CONSULT NOTE - Follow Up Consult  Pharmacy Consult for heparin infusion Indication:  ischemia  No Known Allergies  Patient Measurements: Height: 5\' 7"  (170.2 cm) Weight: 81.5 kg (179 lb 10.8 oz) IBW/kg (Calculated) : 61.6 Heparin Dosing Weight: 78.4 kg  Vital Signs: Temp: 97.5 F (36.4 C) (09/14 0345) Temp Source: Oral (09/14 0345) BP: 130/68 (09/14 0345) Pulse Rate: 95 (09/14 0345)  Labs: Recent Labs    06/18/22 1222 06/18/22 1223 06/18/22 1535 06/19/22 0135 06/19/22 0916 06/19/22 1558 06/19/22 2120 06/20/22 0436  HGB 13.8  --   --  12.5  --   --   --  12.0  HCT 43.5  --   --  39.5  --   --   --  37.4  PLT 256  --   --  204  --   --   --  217  APTT  --  32  --   --   --   --   --   --   LABPROT  --  13.9  --   --   --   --   --   --   INR  --  1.1  --   --   --   --   --   --   HEPARINUNFRC  --   --   --  0.23*   < > 0.41 0.39 0.36  CREATININE 0.56  --   --  0.63  --   --   --  0.42*  TROPONINIHS 30*  --  41*  --   --   --   --   --    < > = values in this interval not displayed.     Estimated Creatinine Clearance: 76 mL/min (A) (by C-G formula based on SCr of 0.42 mg/dL (L)).   Assessment: Tamara Hall is a 66 y.o. female presenting with increased SOB. Patient also reporting L 5th toe pain with increased erythema, found to have poor capillary refill on exam. PMH significant for rectal cancer stage IV, TUD, COPD, GERD. Patient not on anticoagulation per chart review. Pharmacy has been consulted to initiate and manage heparin infusion.  Baseline Labs: aPTT 32, PT 13.9, INR 1.1, Hgb 13.8, Hct 43.5, Plt 256  Goal of Therapy:  Heparin level 0.3-0.7 units/ml Monitor platelets by anticoagulation protocol: Yes  Date Time HL Rate/Comment 9/13 0135 0.23 SUBtherapeutic 9/13 0916 0.24 SUBtherapeutic 9/13 1558 0.41 Therapeutic x1 9/13    2120    0.39    Therapeutic X 2  9/14    0436    0.36    Therapeutic X 3    Plan: 9/14:  HL @ 0436 = 0.36,  therapeutic X 3 Will continue pt on current rate and recheck HL on 9/14 with AM labs.   Troyce Febo D 06/20/2022 6:08 AM

## 2022-06-21 ENCOUNTER — Encounter: Payer: Self-pay | Admitting: Hematology and Oncology

## 2022-06-21 ENCOUNTER — Encounter: Payer: Self-pay | Admitting: Internal Medicine

## 2022-06-21 ENCOUNTER — Other Ambulatory Visit (HOSPITAL_COMMUNITY): Payer: Self-pay

## 2022-06-21 LAB — CBC
HCT: 40.3 % (ref 36.0–46.0)
Hemoglobin: 12.3 g/dL (ref 12.0–15.0)
MCH: 28 pg (ref 26.0–34.0)
MCHC: 30.5 g/dL (ref 30.0–36.0)
MCV: 91.8 fL (ref 80.0–100.0)
Platelets: 219 10*3/uL (ref 150–400)
RBC: 4.39 MIL/uL (ref 3.87–5.11)
RDW: 14.3 % (ref 11.5–15.5)
WBC: 8.9 10*3/uL (ref 4.0–10.5)
nRBC: 0 % (ref 0.0–0.2)

## 2022-06-21 LAB — BASIC METABOLIC PANEL
Anion gap: 11 (ref 5–15)
BUN: 10 mg/dL (ref 8–23)
CO2: 32 mmol/L (ref 22–32)
Calcium: 8.1 mg/dL — ABNORMAL LOW (ref 8.9–10.3)
Chloride: 97 mmol/L — ABNORMAL LOW (ref 98–111)
Creatinine, Ser: 0.53 mg/dL (ref 0.44–1.00)
GFR, Estimated: >60 mL/min (ref >60)
Glucose, Bld: 107 mg/dL — ABNORMAL HIGH (ref 70–99)
Potassium: 4 mmol/L (ref 3.5–5.1)
Sodium: 140 mmol/L (ref 135–145)

## 2022-06-21 LAB — HEPARIN LEVEL (UNFRACTIONATED): Heparin Unfractionated: >1.1 IU/mL — ABNORMAL HIGH (ref 0.30–0.70)

## 2022-06-21 LAB — MAGNESIUM: Magnesium: 2 mg/dL (ref 1.7–2.4)

## 2022-06-21 MED ORDER — HEPARIN SOD (PORK) LOCK FLUSH 100 UNIT/ML IV SOLN
500.0000 [IU] | Freq: Once | INTRAVENOUS | Status: AC
  Administered 2022-06-21: 500 [IU] via INTRAVENOUS
  Filled 2022-06-21: qty 5

## 2022-06-21 MED ORDER — APIXABAN 5 MG PO TABS: 5.0000 mg | ORAL_TABLET | Freq: Two times a day (BID) | ORAL | 1 refills | Status: AC

## 2022-06-21 MED ORDER — ALPRAZOLAM 1 MG PO TABS: 1.0000 mg | ORAL_TABLET | Freq: Three times a day (TID) | ORAL | 0 refills | Status: AC | PRN

## 2022-06-21 MED ORDER — APIXABAN (ELIQUIS) VTE STARTER PACK (10MG AND 5MG): ORAL_TABLET | ORAL | 0 refills | Status: AC

## 2022-06-21 MED ORDER — NICOTINE 21 MG/24HR TD PT24: 21.0000 mg | MEDICATED_PATCH | Freq: Every day | TRANSDERMAL | 0 refills | Status: AC | PRN

## 2022-06-21 MED ORDER — PANTOPRAZOLE SODIUM 40 MG PO TBEC
80.0000 mg | DELAYED_RELEASE_TABLET | Freq: Every day | ORAL | Status: DC
  Administered 2022-06-21: 80 mg via ORAL
  Filled 2022-06-21: qty 2

## 2022-06-21 MED ORDER — ALPRAZOLAM 0.5 MG PO TABS: 0.5000 mg | ORAL_TABLET | Freq: Three times a day (TID) | ORAL | 0 refills | Status: DC | PRN

## 2022-06-21 MED ORDER — CLOPIDOGREL BISULFATE 75 MG PO TABS: 75.0000 mg | ORAL_TABLET | Freq: Every day | ORAL | 2 refills | Status: AC

## 2022-06-21 MED ORDER — PREDNISONE 20 MG PO TABS
40.0000 mg | ORAL_TABLET | Freq: Every day | ORAL | Status: DC
  Administered 2022-06-21: 40 mg via ORAL
  Filled 2022-06-21: qty 2

## 2022-06-21 NOTE — Discharge Summary (Signed)
Physician Discharge Summary   Tamara Hall  female DOB: 13-Jul-1956  WPY:[id number]  PCP: Loni Muse, MD  Admit date: 06/18/2022 Discharge date: 06/21/2022  Admitted From: home Disposition:  home with hospice CODE STATUS: DNR  Discharge Instructions     Discharge instructions   Complete by: As directed    You have 2 stents for your legs to open up your blood vessels, and vascular surgery wants you to take Eliquis and Plavix.  For Eliquis, I prescribed starter-pack for first month, and then 5 mg twice a day for the next 2 months.  You will need to see your outpatient doctor for further refills.  You don't need to take your amlodipine and valsartan since your blood pressure has been normal without them.  Dr. Rogue Bussing will be your hospice attending and can prescribe you medications that you need for comfort.   Dr. Enzo Bi [facility] Course:  For full details, please see H&P, progress notes, consult notes and ancillary notes.  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  Pt was transitioned to Eliquis and plavix was also added. --Pt was still having significant pain (though improved from prior) after stent placement, and was management with opioid pain meds.   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 Rogue Bussing --After discussion with hospice liaison, decision made to return home with hospice.  --control of pain, anxiety and respiratory distress.  Dr. Rogue Bussing will service as hospice attending.   Acute on chronic hypoxemic respiratory failure Baseline 2L home O2 --intermittent desat down to mid 80's while on 2-3L [place] --CT chest showed interval increase in size and number of multiple metastatic foci in both lungs.  Also has significant wheezing and rhonchi, suggesting COPD exacerbation, which was treated. --due to severe anxiety contributing to respiratory distress, pt was started on Xanax which helped, so pt was discharged on Xanax.     Acute COPD exacerbation Stage 3 severe COPD by GOLD classification ([place]) --significant wheezing and rhonchi --s/p solumedrol 125 mg in the ED, f/b solumedrol 40 mg daily and then prednisone (completed steroid course prior to discharge).  Pt also received a course of azithromycin and scheduled DuoNeb.  Pt was prescribed Xanax which helped with her respiratory distress. --pt reported she is currently only taking albuterol inhaler PRN at home.   Partial small bowel obstruction (HCC) - Currently asymptomatic - GenSurg consulted, no intervention needed currently   Hypokalemia --monitored and repleted PRN   Tobacco use - As needed Nicotine patch for craving ordered   Multifocal pneumonia, ruled out --imaging studies showed extensive mets.  No fever, no leukocytosis, procal neg x2 --d/c'ed vanc and cefepime   Hx of HTN, not currently active --home amlodipine and valsartan d/c'ed.   Unless noted above, medications under "STOP" list are ones pt was not  taking PTA.  Discharge Diagnoses:  Principal Problem:   Multifocal pneumonia Active Problems:   Rectal cancer ([place])   Rectum neoplasm   Tobacco use   Malignant neoplasm metastatic to liver [facility])   Malignant neoplasm metastatic to left lung (HCC)   Hypokalemia    Partial small bowel obstruction (HCC)   Stage 3 severe COPD by GOLD classification ([place])   Acute respiratory failure with hypoxia (HCC)   Aspiration pneumonia (HCC)   Acute pain of left foot   Acute lower limb ischemia   30 Day Unplanned Readmission Risk Score    Flowsheet Row ED to Hosp-Admission (Current) from 06/18/2022 in [place] (1A)  30 Day Unplanned Readmission Risk Score (%) 17.63 Filed at 06/21/2022 0401       This score is the patient's risk of an unplanned readmission within 30 days of being discharged (0 -100%). The score is based on dignosis, age, lab data, medications, orders, and past utilization.   Low:  0-14.9   Medium: 15-21.9   High: 22-29.9   Extreme: 30 and above         Discharge Instructions:  Allergies as of 06/21/2022   No Known Allergies      Medication List     STOP taking these medications    amLODipine 5 MG tablet Commonly known as: NORVASC   benzonatate 200 MG capsule Commonly known as: TESSALON   CALCIUM 500 PO   chlorpheniramine-HYDROcodone 10-8 MG/5ML Commonly known as: TUSSIONEX   HYDROcodone-acetaminophen 10-325 MG tablet Commonly known as: NORCO   Stiolto Respimat 2.5-2.5 MCG/ACT Aers Generic drug: Tiotropium Bromide-Olodaterol   valsartan 80 MG tablet Commonly known as: DIOVAN       TAKE these medications    albuterol 108 (90 Base) MCG/ACT inhaler Commonly known as: VENTOLIN HFA Inhale 1-2 puffs into the lungs every 6 (six) hours as needed for wheezing or shortness of breath.   ALPRAZolam 1 MG tablet Commonly known as: XANAX Take 1 tablet (1 mg total) by mouth 3 (three) times daily as needed for up to 7 days for anxiety.   Apixaban Starter Pack (10mg  and 5mg ) Commonly known as: ELIQUIS STARTER PACK Take as directed on package: start with two-5mg  tablets twice daily for 7 days. On day 8, switch to one-5mg  tablet twice daily.   apixaban 5 MG Tabs tablet Commonly known as:  ELIQUIS Take 1 tablet (5 mg total) by mouth 2 (two) times daily. Start taking on: July 21, 2022   baclofen 10 MG tablet Commonly known as: LIORESAL Take 10 mg by mouth 3 (three) times daily.   clopidogrel 75 MG tablet Commonly known as: PLAVIX Take 1 tablet (75 mg total) by mouth daily.   folic acid 1 MG tablet Commonly known as: FOLVITE TAKE 1 TABLET BY MOUTH EVERY DAY   gabapentin 300 MG capsule Commonly known as: NEURONTIN Take 1,200 mg by mouth 3 (three) times daily.   HYDROmorphone 8 MG tablet Commonly known as: DILAUDID Take 8 mg by mouth every 4 (four) hours as needed. What changed: Another medication with the same name was removed. Continue taking this medication, and follow the directions you see here.   nicotine 21 mg/24hr patch Commonly known as: NICODERM CQ - dosed in mg/24 hours Place 1 patch (21 mg total) onto the skin daily as needed (nicotine craving).   nortriptyline 25 MG capsule Commonly known as: PAMELOR Take 25 mg by mouth at bedtime.   omeprazole 20 MG capsule Commonly known as:  PRILOSEC TAKE 1 CAPSULE BY MOUTH EVERY DAY   ondansetron 8 MG disintegrating tablet Commonly known as: Zofran ODT Take 1 tablet (8 mg total) by mouth every 8 (eight) hours as needed for nausea or vomiting.   Premier Drainable Pouch 64MM Pouch Misc   zolpidem 5 MG tablet Commonly known as: AMBIEN Take 5 mg by mouth at bedtime as needed.         Follow-up Information     Schnier, Dolores Lory, MD Follow up.   Specialties: Vascular Surgery, Cardiology, Radiology, Vascular Surgery Why: 3-4 weeks with ABI Contact information: [place] [address] [phone]         Cammie Sickle, MD Follow up.   Specialties: Internal Medicine, Oncology Contact information: [place] [address] [phone]                 No Known Allergies   The results of significant diagnostics from this hospitalization  (including imaging, microbiology, ancillary and laboratory) are listed below for reference.   Consultations:   Procedures/Studies: DG ABD ACUTE 2+V W 1V CHEST  Result Date: 06/19/2022 CLINICAL DATA:  66 year old female with metastatic colorectal cancer. Small-bowel obstruction. EXAM: DG ABDOMEN ACUTE WITH 1 VIEW CHEST COMPARISON:  CT Chest, Abdomen, and Pelvis 06/18/2022. FINDINGS: Seated AP chest at 0708 hours. No pneumothorax or pneumoperitoneum. Numerous lung metastases. Superimposed confluent right upper lobe airspace disease. Left lower lobe mass and/or small pleural effusion. Stable right chest Port-A-Cath. Stable cardiac size and mediastinal contours. Visualized tracheal air column is within normal limits. Stable visualized osseous structures. Upright and supine views of the abdomen and pelvis at 0710 hours. Unchanged bowel gas pattern from the CT scout view yesterday. Ongoing dilated and gas-filled small bowel loops with left lower quadrant ostomy, distal colectomy. Aortoiliac calcified atherosclerosis. Iliac artery vascular stents suspected. Stable visualized osseous structures. Calcified splenic granulomas. No pneumoperitoneum identified. IMPRESSION: 1. No improvement in obstructed bowel gas pattern since the CT yesterday. No pneumoperitoneum. 2. Stable chest with bilateral metastases, right upper lobe airspace opacity which may be superimposed pneumonia. Electronically Signed   By: Genevie Ann M.D.   On: 06/19/2022 07:42   PERIPHERAL VASCULAR CATHETERIZATION  Result Date: 06/18/2022 See surgical note for result.  US Venous Img Lower Bilateral  Result Date: 06/18/2022 CLINICAL DATA:  Bilateral leg pain EXAM: BILATERAL LOWER EXTREMITY VENOUS DOPPLER ULTRASOUND TECHNIQUE: Gray-scale sonography with graded compression, as well as color Doppler and duplex ultrasound were performed to evaluate the lower extremity deep venous systems from the level of the common femoral vein and including the common  femoral, femoral, profunda femoral, popliteal and calf veins including the posterior tibial, peroneal and gastrocnemius veins when visible. The superficial great saphenous vein was also interrogated. Spectral Doppler was utilized to evaluate flow at rest and with distal augmentation maneuvers in the common femoral, femoral and popliteal veins. COMPARISON:  None Available. FINDINGS: RIGHT LOWER EXTREMITY Common Femoral Vein: No evidence of thrombus. Normal compressibility, respiratory phasicity and response to augmentation. Saphenofemoral Junction: No evidence of thrombus. Normal compressibility and flow on color Doppler imaging. Profunda Femoral Vein: No evidence of thrombus. Normal compressibility and flow on color Doppler imaging. Femoral Vein: No evidence of thrombus. Normal compressibility, respiratory phasicity and response to augmentation. Popliteal Vein: No evidence of thrombus. Normal compressibility, respiratory phasicity and response to augmentation. Calf Veins: No evidence of thrombus. Normal compressibility and flow on color Doppler imaging. Other: Optimal 5.5 x 1.8 x 4.2 cm fluid collection in  the right popliteal fossa, compatible with a Baker's cyst. LEFT LOWER EXTREMITY Common Femoral Vein: No evidence of thrombus. Normal compressibility, respiratory phasicity and response to augmentation. Saphenofemoral Junction: No evidence of thrombus. Normal compressibility and flow on color Doppler imaging. Profunda Femoral Vein: No evidence of thrombus. Normal compressibility and flow on color Doppler imaging. Femoral Vein: No evidence of thrombus. Normal compressibility, respiratory phasicity and response to augmentation. Popliteal Vein: No evidence of thrombus. Normal compressibility, respiratory phasicity and response to augmentation. Calf Veins: No evidence of thrombus. Normal compressibility and flow on color Doppler imaging. IMPRESSION: 1. No evidence of deep venous thrombosis in either lower extremity. 2.  Approximately 5.5 cm probable right Baker's cyst. Electronically Signed   By: Margaretha Sheffield M.D.   On: 06/18/2022 15:43   CT ABDOMEN PELVIS W CONTRAST  Result Date: 06/18/2022 CLINICAL DATA:  Abdominal pain, shortness of breath EXAM: CT ABDOMEN AND PELVIS WITH CONTRAST TECHNIQUE: Multidetector CT imaging of the abdomen and pelvis was performed using the standard protocol following bolus administration of intravenous contrast. RADIATION DOSE REDUCTION: This exam was performed according to the departmental dose-optimization program which includes automated exposure control, adjustment of the mA and/or kV according to patient size and/or use of iterative reconstruction technique. CONTRAST:  74mL OMNIPAQUE IOHEXOL 350 MG/ML SOLN COMPARISON:  Previous studies including the examination of 11/02/2021 FINDINGS: Hepatobiliary: There is 2.9 cm low-density lesion in liver with no significant change, possibly residual finding from previously treated neoplastic process. There is subcentimeter low-density in the right lobe with no significant change. There are multiple calcified granulomas. Pancreas: No focal abnormalities are seen. Spleen: There are calcified granulomas. Adrenals/Urinary Tract: Adrenals are unremarkable. There is no hydronephrosis. There are small calcifications in left renal hilum, possibly arterial calcifications. Ureters are not dilated. Urinary bladder is distended. Stomach/Bowel: Stomach is unremarkable. There is dilation of small-bowel loops measuring up to 4.1 cm in diameter. There is decompression of distal small bowel loops. Level of obstruction is possibly in left mid abdomen. There is previous resection of sigmoid colon with colostomy left lower quadrant. There is herniation of portion of: In parastomal location. Vascular/Lymphatic: There is infrarenal abdominal aortic aneurysm measuring 4.5 x 4.4 cm. It measured 4.1 cm in diameter in the previous study. Part of the lumen is filled with  thrombus. Severe atherosclerotic changes are noted in the proximal courses of both common iliac arteries. There is no retroperitoneal hematoma. Reproductive: Uterus is not seen. Presacral soft tissue thickening has not changed. Other: There is no ascites or pneumoperitoneum. Musculoskeletal: No significant interval changes are noted. IMPRESSION: There is abnormal dilation of proximal small bowel loops measuring up to 4.1 cm in diameter with decompression of distal small bowel loops suggesting partial small bowel obstruction. This may be due to neoplastic process or adhesions. There is no pneumoperitoneum. There is no hydronephrosis. There is interval increase in size of infrarenal abdominal aortic aneurysm. Aneurysm in the current study measures 4.5 cm. Aneurysm measured 4.1 cm in diameter in the study done on 11/02/2021. Other findings as described in the body of the report. Electronically Signed   By: Elmer Picker M.D.   On: 06/18/2022 15:34   CT Angio Chest PE W and/or Wo Contrast  Result Date: 06/18/2022 CLINICAL DATA:  Shortness of breath EXAM: CT ANGIOGRAPHY CHEST WITH CONTRAST TECHNIQUE: Multidetector CT imaging of the chest was performed using the standard protocol during bolus administration of intravenous contrast. Multiplanar CT image reconstructions and MIPs were obtained to evaluate the vascular anatomy. RADIATION  DOSE REDUCTION: This exam was performed according to the departmental dose-optimization program which includes automated exposure control, adjustment of the mA and/or kV according to patient size and/or use of iterative reconstruction technique. CONTRAST:  71mL OMNIPAQUE IOHEXOL 350 MG/ML SOLN COMPARISON:  Previous studies including the CT done on 11/05/2021 and chest radiographs done on 06/18/2022 FINDINGS: Cardiovascular: There is moderate pericardial effusion. Coronary artery calcifications are seen. There is homogeneous enhancement in thoracic aorta. There are no intraluminal  filling defects in pulmonary artery branches. Mediastinum/Nodes: There are slightly enlarged lymph nodes in mediastinum with no significant interval change. There are calcified nodes in subcarinal region with no significant change. Lungs/Pleura: There is interval increase in size and number of numerous metastatic foci in both lungs There is new patchy ground-glass infiltrate in right upper lobe and right middle lobe There is a pleural-based mass in posterior left lower lung field with interval increase in size. The lesion measures approximately 7.3 cm in maximum diameter. There is a large lytic lesion in the posterior left eighth rib with interval progression. Upper Abdomen: There is an ill-defined 2.5 cm low-density in the liver in image 126 of series 2, possibly hepatic metastatic disease or residual change from previous therapy for metastatic disease. There are multiple calcified granulomas in liver and spleen. Musculoskeletal: There is interval progression of metastatic disease in the posterior left eighth rib. No new focal lytic lesions are seen. Review of the MIP images confirms the above findings. IMPRESSION: There is no evidence of pulmonary artery embolism. There is no evidence of thoracic aortic dissection. There is a large lytic lesion in the posterior left eighth rib consistent with metastatic disease with interval progression. There is a large 7.3 cm soft tissue mass associated with the lytic lesion in left lower lobe with interval increase in size suggesting progression of metastatic disease. There is interval increase in size and number of multiple metastatic foci in both lungs suggesting interval progression of pulmonary metastatic disease. Moderate to large pericardial effusion.  Coronary artery disease. Electronically Signed   By: Elmer Picker M.D.   On: 06/18/2022 15:17   DG Chest 2 View  Result Date: 06/18/2022 CLINICAL DATA:  History of colon cancer.  Shortness of breath. EXAM: CHEST  - 2 VIEW COMPARISON:  June 06, 2021. FINDINGS: Stable cardiomediastinal silhouette. Right-sided Port-A-Cath is unchanged in position. Bilateral pulmonary nodules are noted consistent with metastatic disease. Left basilar atelectasis or infiltrate is noted with associated pleural effusion. Bony thorax is unremarkable. IMPRESSION: Bilateral pulmonary nodules are noted consistent with metastatic disease. Left basilar atelectasis or infiltrate is noted with associated pleural effusion. Electronically Signed   By: Marijo Conception M.D.   On: 06/18/2022 13:08      Labs: BNP (last 3 results) Recent Labs    06/18/22 1222  BNP 976.7*   Basic Metabolic Panel: Recent Labs  Lab 06/18/22 1222 06/18/22 1535 06/19/22 0135 06/20/22 0436 06/21/22 0319  NA 136  --  138 138 140  K 2.8*  --  3.9 3.8 4.0  CL 90*  --  100 99 97*  CO2 35*  --  33* 31 32  GLUCOSE 115*  --  143* 91 107*  BUN <5*  --  <5* 7* 10  CREATININE 0.56  --  0.63 0.42* 0.53  CALCIUM 8.6*  --  7.7* 8.1* 8.1*  MG  --  1.7  --  1.9 2.0   Liver Function Tests: Recent Labs  Lab 06/18/22 1222  AST 18  ALT 12  ALKPHOS 108  BILITOT 0.5  PROT 6.2*  ALBUMIN 2.8*   No results for input(s): "LIPASE", "AMYLASE" in the last 168 hours. No results for input(s): "AMMONIA" in the last 168 hours. CBC: Recent Labs  Lab 06/18/22 1222 06/19/22 0135 06/20/22 0436 06/21/22 0319  WBC 10.0 6.1 10.4 8.9  HGB 13.8 12.5 12.0 12.3  HCT 43.5 39.5 37.4 40.3  MCV 88.8 91.2 90.3 91.8  PLT 256 204 217 219   Cardiac Enzymes: No results for input(s): "CKTOTAL", "CKMB", "CKMBINDEX", "TROPONINI" in the last 168 hours. BNP: Invalid input(s): "POCBNP" CBG: No results for input(s): "GLUCAP" in the last 168 hours. D-Dimer No results for input(s): "DDIMER" in the last 72 hours. Hgb A1c No results for input(s): "HGBA1C" in the last 72 hours. Lipid Profile No results for input(s): "CHOL", "HDL", "LDLCALC", "TRIG", "CHOLHDL", "LDLDIRECT" in the  last 72 hours. Thyroid function studies No results for input(s): "TSH", "T4TOTAL", "T3FREE", "THYROIDAB" in the last 72 hours.  Invalid input(s): "FREET3" Anemia work up No results for input(s): "VITAMINB12", "FOLATE", "FERRITIN", "TIBC", "IRON", "RETICCTPCT" in the last 72 hours. Urinalysis    Component Value Date/Time   COLORURINE YELLOW (A) 06/06/2021 1315   APPEARANCEUR HAZY (A) 06/06/2021 1315   LABSPEC 1.009 06/06/2021 1315   PHURINE 6.0 06/06/2021 1315   GLUCOSEU NEGATIVE 06/06/2021 1315   HGBUR SMALL (A) 06/06/2021 1315   BILIRUBINUR NEGATIVE 06/06/2021 1315   KETONESUR NEGATIVE 06/06/2021 1315   PROTEINUR NEGATIVE 06/06/2021 1315   NITRITE NEGATIVE 06/06/2021 1315   LEUKOCYTESUR LARGE (A) 06/06/2021 1315   Sepsis Labs Recent Labs  Lab 06/18/22 1222 06/19/22 0135 06/20/22 0436 06/21/22 0319  WBC 10.0 6.1 10.4 8.9   Microbiology Recent Results (from the past 240 hour(s))  MRSA Next Gen by PCR, Nasal     Status: None   Collection Time: 06/18/22  3:32 PM   Specimen: Nasal Mucosa; Nasal Swab  Result Value Ref Range Status   MRSA by PCR Next Gen NOT DETECTED NOT DETECTED Final    Comment: (NOTE) The GeneXpert MRSA Assay (FDA approved for NASAL specimens only), is one component of a comprehensive MRSA colonization surveillance program. It is not intended to diagnose MRSA infection nor to guide or monitor treatment for MRSA infections. Test performance is not FDA approved in patients less than 46 years old. Performed at [facility], [address] [place], [place] [address]      Total time spend on discharging this patient, including the last patient exam, discussing the hospital stay, instructions for ongoing care as it relates to all pertinent caregivers, as well as preparing the medical discharge records, prescriptions, and/or referrals as applicable, is 35 minutes.    Enzo Bi, MD  [facility] 06/21/2022, 8:19 AM

## 2022-06-21 NOTE — TOC Progression Note (Addendum)
Transition of Care [facility]) - Progression Note    Patient Details  Name: Tamara Hall MRN: [id number] Date of Birth: [birth year]  Transition of Care [facility]) CM/SW [place], RN Phone Number: 06/21/2022, 8:46 AM  Clinical Narrative:     The patient will go home with [facility]  EMS not needed per [facility], sister will transport       Expected Discharge Plan and Services           Expected Discharge Date: 06/21/22                                     Social Determinants of Health (SDOH) Interventions    Readmission Risk Interventions     No data to display

## 2022-06-21 NOTE — Care Management Important Message (Signed)
Important Message  Patient Details  Name: Tamara Hall MRN: [id number] Date of Birth: [birth year]   Medicare Important Message Given:  Other (see comment)  Disposition to discharge with hospice services.  Medicare IM withheld at this time out of respect for patient and family.   Dannette Barbara 06/21/2022, 8:25 AM

## 2022-06-21 NOTE — Progress Notes (Signed)
[facility])    If applicable, please send signed and completed DNR with patient/family upon discharge. Please provide prescriptions at discharge as needed to ensure ongoing symptom management and a transport packet.   [facility] information and contact numbers given to family. Family will transport via private vehicle and above information shared with TOC.    Please call with any questions/concerns.    Thank you for the opportunity to participate in this patient's care   Daphene Calamity, MSW [facility] Liaison  [phone]

## 2022-06-24 ENCOUNTER — Other Ambulatory Visit: Payer: Self-pay | Admitting: Hospice and Palliative Medicine

## 2022-06-24 ENCOUNTER — Other Ambulatory Visit: Payer: Self-pay | Admitting: Internal Medicine

## 2022-06-24 DIAGNOSIS — K2100 Gastro-esophageal reflux disease with esophagitis, without bleeding: Secondary | ICD-10-CM

## 2022-06-24 MED ORDER — NORTRIPTYLINE HCL 25 MG PO CAPS: 25.0000 mg | ORAL_CAPSULE | Freq: Every day | ORAL | 2 refills | Status: AC

## 2022-06-24 MED ORDER — MORPHINE SULFATE (CONCENTRATE) 10 MG /0.5 ML PO SOLN: 10.0000 mg | ORAL | 0 refills | Status: AC | PRN

## 2022-06-24 MED ORDER — ZOLPIDEM TARTRATE 5 MG PO TABS: 5.0000 mg | ORAL_TABLET | Freq: Every evening | ORAL | 2 refills | Status: AC | PRN

## 2022-06-24 MED ORDER — LORAZEPAM 0.5 MG PO TABS: 0.5000 mg | ORAL_TABLET | ORAL | 0 refills | Status: AC | PRN

## 2022-06-24 NOTE — Progress Notes (Signed)
I spoke with hospice nurse, April. Patient still having persistent anxiety/dyspnea. Will start morphine elixir and rotate to lorazepam from alprazolam.

## 2022-06-25 ENCOUNTER — Encounter: Payer: Self-pay | Admitting: Hematology and Oncology

## 2022-06-25 ENCOUNTER — Encounter: Payer: Self-pay | Admitting: Internal Medicine

## 2022-07-01 ENCOUNTER — Other Ambulatory Visit: Payer: Self-pay | Admitting: Hospice and Palliative Medicine

## 2022-07-01 MED ORDER — HYDROMORPHONE HCL 8 MG PO TABS: 8.0000 mg | ORAL_TABLET | ORAL | 0 refills | Status: AC | PRN

## 2022-07-01 NOTE — Progress Notes (Signed)
Hospice nurse, April, called requesting refill of hydromorphone. Patient has switched pharmacies to [facility].

## 2022-07-07 DEATH — deceased

## 2022-07-26 ENCOUNTER — Encounter: Payer: Self-pay | Admitting: Internal Medicine

## 2022-07-26 ENCOUNTER — Encounter: Payer: Self-pay | Admitting: Hematology and Oncology

## 2022-08-02 ENCOUNTER — Ambulatory Visit: Payer: Medicare HMO | Admitting: Pulmonary Disease

## 2022-12-23 ENCOUNTER — Encounter: Payer: Self-pay | Admitting: Pulmonary Disease
